# Patient Record
Sex: Female | Born: 1950 | Race: White | Hispanic: No | Smoking: Current every day smoker
Health system: Southern US, Community
[De-identification: ages and names within clinical notes are randomized; demographics above are authoritative.]

## PROBLEM LIST (undated history)

## (undated) ENCOUNTER — Ambulatory Visit: Admission: EM | Payer: 59 | Source: Home / Self Care

## (undated) DIAGNOSIS — F17200 Nicotine dependence, unspecified, uncomplicated: Secondary | ICD-10-CM

## (undated) DIAGNOSIS — M810 Age-related osteoporosis without current pathological fracture: Secondary | ICD-10-CM

## (undated) DIAGNOSIS — C20 Malignant neoplasm of rectum: Secondary | ICD-10-CM

## (undated) DIAGNOSIS — J439 Emphysema, unspecified: Secondary | ICD-10-CM

## (undated) DIAGNOSIS — J449 Chronic obstructive pulmonary disease, unspecified: Secondary | ICD-10-CM

## (undated) DIAGNOSIS — M1990 Unspecified osteoarthritis, unspecified site: Secondary | ICD-10-CM

## (undated) DIAGNOSIS — C801 Malignant (primary) neoplasm, unspecified: Secondary | ICD-10-CM

## (undated) DIAGNOSIS — E162 Hypoglycemia, unspecified: Secondary | ICD-10-CM

## (undated) DIAGNOSIS — H269 Unspecified cataract: Secondary | ICD-10-CM

## (undated) DIAGNOSIS — K56609 Unspecified intestinal obstruction, unspecified as to partial versus complete obstruction: Secondary | ICD-10-CM

## (undated) DIAGNOSIS — R198 Other specified symptoms and signs involving the digestive system and abdomen: Secondary | ICD-10-CM

## (undated) DIAGNOSIS — R0600 Dyspnea, unspecified: Secondary | ICD-10-CM

## (undated) DIAGNOSIS — M8580 Other specified disorders of bone density and structure, unspecified site: Secondary | ICD-10-CM

## (undated) DIAGNOSIS — F32A Depression, unspecified: Secondary | ICD-10-CM

## (undated) DIAGNOSIS — K625 Hemorrhage of anus and rectum: Secondary | ICD-10-CM

## (undated) DIAGNOSIS — F329 Major depressive disorder, single episode, unspecified: Secondary | ICD-10-CM

## (undated) DIAGNOSIS — D123 Benign neoplasm of transverse colon: Secondary | ICD-10-CM

## (undated) DIAGNOSIS — E871 Hypo-osmolality and hyponatremia: Secondary | ICD-10-CM

## (undated) DIAGNOSIS — C539 Malignant neoplasm of cervix uteri, unspecified: Secondary | ICD-10-CM

## (undated) DIAGNOSIS — D124 Benign neoplasm of descending colon: Secondary | ICD-10-CM

## (undated) HISTORY — DX: Malignant neoplasm of cervix uteri, unspecified: C53.9

## (undated) HISTORY — DX: Nicotine dependence, unspecified, uncomplicated: F17.200

## (undated) HISTORY — PX: ABDOMINAL HYSTERECTOMY: SHX81

## (undated) HISTORY — DX: Major depressive disorder, single episode, unspecified: F32.9

## (undated) HISTORY — DX: Hemorrhage of anus and rectum: K62.5

## (undated) HISTORY — DX: Hypoglycemia, unspecified: E16.2

## (undated) HISTORY — DX: Unspecified cataract: H26.9

## (undated) HISTORY — PX: CHOLECYSTECTOMY: SHX55

## (undated) HISTORY — DX: Chronic obstructive pulmonary disease, unspecified: J44.9

## (undated) HISTORY — PX: TUBAL LIGATION: SHX77

## (undated) HISTORY — DX: Hypo-osmolality and hyponatremia: E87.1

## (undated) HISTORY — DX: Age-related osteoporosis without current pathological fracture: M81.0

## (undated) HISTORY — DX: Unspecified intestinal obstruction, unspecified as to partial versus complete obstruction: K56.609

## (undated) HISTORY — DX: Other specified disorders of bone density and structure, unspecified site: M85.80

## (undated) HISTORY — DX: Depression, unspecified: F32.A

## (undated) HISTORY — DX: Emphysema, unspecified: J43.9

## (undated) HISTORY — PX: EYE SURGERY: SHX253

## (undated) HISTORY — PX: APPENDECTOMY: SHX54

## (undated) HISTORY — DX: Malignant (primary) neoplasm, unspecified: C80.1

## (undated) HISTORY — DX: Unspecified osteoarthritis, unspecified site: M19.90

## (undated) HISTORY — PX: SHOULDER SURGERY: SHX246

---
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
## 1977-11-01 DIAGNOSIS — C539 Malignant neoplasm of cervix uteri, unspecified: Secondary | ICD-10-CM

## 1977-11-01 HISTORY — DX: Malignant neoplasm of cervix uteri, unspecified: C53.9

## 1998-10-19 ENCOUNTER — Emergency Department (HOSPITAL_COMMUNITY): Admission: EM | Admit: 1998-10-19 | Discharge: 1998-10-19 | Payer: Self-pay

## 1999-10-29 ENCOUNTER — Ambulatory Visit (HOSPITAL_COMMUNITY): Admission: RE | Admit: 1999-10-29 | Discharge: 1999-10-29 | Payer: Self-pay | Admitting: *Deleted

## 2000-01-08 ENCOUNTER — Ambulatory Visit (HOSPITAL_COMMUNITY): Admission: RE | Admit: 2000-01-08 | Discharge: 2000-01-08 | Payer: Self-pay | Admitting: *Deleted

## 2000-03-03 ENCOUNTER — Ambulatory Visit (HOSPITAL_COMMUNITY): Admission: RE | Admit: 2000-03-03 | Discharge: 2000-03-03 | Payer: Self-pay | Admitting: *Deleted

## 2002-09-18 ENCOUNTER — Ambulatory Visit (HOSPITAL_COMMUNITY): Admission: RE | Admit: 2002-09-18 | Discharge: 2002-09-18 | Payer: Self-pay | Admitting: Family Medicine

## 2003-09-12 ENCOUNTER — Ambulatory Visit (HOSPITAL_COMMUNITY): Admission: RE | Admit: 2003-09-12 | Discharge: 2003-09-12 | Payer: Self-pay | Admitting: Internal Medicine

## 2003-09-20 ENCOUNTER — Ambulatory Visit (HOSPITAL_COMMUNITY): Admission: RE | Admit: 2003-09-20 | Discharge: 2003-09-20 | Payer: Self-pay | Admitting: Internal Medicine

## 2004-01-15 ENCOUNTER — Emergency Department (HOSPITAL_COMMUNITY): Admission: AD | Admit: 2004-01-15 | Discharge: 2004-01-16 | Payer: Self-pay | Admitting: Emergency Medicine

## 2004-02-12 ENCOUNTER — Encounter (INDEPENDENT_AMBULATORY_CARE_PROVIDER_SITE_OTHER): Payer: Self-pay | Admitting: Specialist

## 2004-02-12 ENCOUNTER — Observation Stay (HOSPITAL_COMMUNITY): Admission: RE | Admit: 2004-02-12 | Discharge: 2004-02-13 | Payer: Self-pay

## 2005-04-22 ENCOUNTER — Inpatient Hospital Stay (HOSPITAL_COMMUNITY): Admission: EM | Admit: 2005-04-22 | Discharge: 2005-04-25 | Payer: Self-pay | Admitting: Emergency Medicine

## 2007-12-09 ENCOUNTER — Emergency Department (HOSPITAL_COMMUNITY): Admission: EM | Admit: 2007-12-09 | Discharge: 2007-12-09 | Payer: Self-pay | Admitting: Emergency Medicine

## 2008-12-10 ENCOUNTER — Inpatient Hospital Stay (HOSPITAL_COMMUNITY): Admission: EM | Admit: 2008-12-10 | Discharge: 2008-12-11 | Payer: Self-pay | Admitting: Emergency Medicine

## 2011-02-16 LAB — URINE MICROSCOPIC-ADD ON

## 2011-02-16 LAB — CBC
HCT: 35.1 % — ABNORMAL LOW (ref 36.0–46.0)
Hemoglobin: 11.9 g/dL — ABNORMAL LOW (ref 12.0–15.0)
MCHC: 33.9 g/dL (ref 30.0–36.0)
MCHC: 34.1 g/dL (ref 30.0–36.0)
MCHC: 34.3 g/dL (ref 30.0–36.0)
MCV: 92.5 fL (ref 78.0–100.0)
Platelets: 326 10*3/uL (ref 150–400)
RBC: 3.81 MIL/uL — ABNORMAL LOW (ref 3.87–5.11)
RBC: 4.36 MIL/uL (ref 3.87–5.11)
RBC: 5.09 MIL/uL (ref 3.87–5.11)
RDW: 12.9 % (ref 11.5–15.5)
RDW: 13.2 % (ref 11.5–15.5)

## 2011-02-16 LAB — DIFFERENTIAL
Basophils Absolute: 0 10*3/uL (ref 0.0–0.1)
Eosinophils Absolute: 0 10*3/uL (ref 0.0–0.7)
Lymphocytes Relative: 2 % — ABNORMAL LOW (ref 12–46)
Lymphocytes Relative: 6 % — ABNORMAL LOW (ref 12–46)
Lymphs Abs: 1 10*3/uL (ref 0.7–4.0)
Monocytes Relative: 2 % — ABNORMAL LOW (ref 3–12)
Monocytes Relative: 2 % — ABNORMAL LOW (ref 3–12)
Neutro Abs: 16 10*3/uL — ABNORMAL HIGH (ref 1.7–7.7)
Neutrophils Relative %: 92 % — ABNORMAL HIGH (ref 43–77)
Neutrophils Relative %: 96 % — ABNORMAL HIGH (ref 43–77)

## 2011-02-16 LAB — COMPREHENSIVE METABOLIC PANEL
ALT: 12 U/L (ref 0–35)
AST: 22 U/L (ref 0–37)
Alkaline Phosphatase: 82 U/L (ref 39–117)
BUN: 11 mg/dL (ref 6–23)
CO2: 24 mEq/L (ref 19–32)
Calcium: 7.8 mg/dL — ABNORMAL LOW (ref 8.4–10.5)
Chloride: 104 mEq/L (ref 96–112)
GFR calc Af Amer: 59 mL/min — ABNORMAL LOW (ref >60)
GFR calc non Af Amer: 60 mL/min — ABNORMAL LOW (ref >60)
Glucose, Bld: 93 mg/dL (ref 70–99)
Potassium: 3.1 mEq/L — ABNORMAL LOW (ref 3.5–5.1)
Sodium: 137 mEq/L (ref 135–145)
Total Bilirubin: 0.5 mg/dL (ref 0.3–1.2)
Total Protein: 5 g/dL — ABNORMAL LOW (ref 6.0–8.3)

## 2011-02-16 LAB — URINALYSIS, ROUTINE W REFLEX MICROSCOPIC
Glucose, UA: NEGATIVE mg/dL
Nitrite: POSITIVE — AB
pH: 5.5 (ref 5.0–8.0)

## 2011-02-16 LAB — URINE CULTURE: Colony Count: >=100000

## 2011-02-16 LAB — CULTURE, BLOOD (ROUTINE X 2)

## 2011-02-16 LAB — RAPID URINE DRUG SCREEN, HOSP PERFORMED
Benzodiazepines: NOT DETECTED
Tetrahydrocannabinol: NOT DETECTED

## 2011-02-16 LAB — CARDIAC PANEL(CRET KIN+CKTOT+MB+TROPI)
CK, MB: 1 ng/mL (ref 0.3–4.0)
Relative Index: INVALID (ref 0.0–2.5)
Total CK: 81 U/L (ref 7–177)
Troponin I: <0.01 ng/mL (ref 0.00–0.06)
Troponin I: <0.01 ng/mL (ref 0.00–0.06)

## 2011-02-16 LAB — CK TOTAL AND CKMB (NOT AT ARMC): Relative Index: INVALID (ref 0.0–2.5)

## 2011-02-16 LAB — STOOL CULTURE

## 2011-02-16 LAB — OVA AND PARASITE EXAMINATION

## 2011-02-16 LAB — TSH: TSH: 0.801 u[IU]/mL (ref 0.350–4.500)

## 2011-03-16 NOTE — H&P (Signed)
Deborah Arroyo, Deborah Arroyo                ACCOUNT NO.:  [id number]   MEDICAL RECORD NO.:  [id number]          PATIENT TYPE:  EMS   LOCATION:  ED                           FACILITY:  [facility]   PHYSICIAN:  Raphael Gibney, MD        DATE OF BIRTH:  1951/06/13   DATE OF ADMISSION:  12/10/2008  DATE OF DISCHARGE:                              HISTORY & PHYSICAL   PRIMARY CARE PHYSICIAN:  Unassigned.   CHIEF COMPLAINT:  Syncope.   HISTORY OF PRESENT ILLNESS:  Deborah Arroyo is a 60 year old white female  with no past medical problems, comes in to the emergency department with  at least two episodes of passing out.  She states that she got up  around 12:00 a.m. this morning to use the bathroom.  After she got to  the bathroom she felt lightheaded, dizzy, clammy and then the next thing  she remembers is her son trying to wake her up.  She denies any chest  pain, shortness of breath, spinning of the rooms at this time.  She  states that she also broke out into a cold sweat.  She then went back to  her bed and around 2:15 a.m. got up again to use the restroom.  She  states that she walked up to the living room and she again felt  lightheaded, dizzy, cold and clammy.  The next thing she can remember is  her son and nephew waking her up again.  This time, she had soiled her  clothes with stools as well.  She denies any postictal symptoms such as  headache or confusion.  No reported seizure activity was noted per her  son as well.  Deborah Arroyo states that in the early 1970s she had an  episode of hypoglycemia and passed out.  She was seen in the [facility] back then recovered without any sequelae.  She has not had any  other episodes of syncope ever since.   PAST MEDICAL HISTORY:  1. Current tobacco use.  2. Distant history of hypoglycemic episode and syncope denies any      significant history of coronary artery disease, seizures or strokes      at this time.   PAST SURGICAL HISTORY:  1.  Hysterectomy.  2. Cholecystectomy.  3. Appendectomy.  4. Shoulder surgery.   ALLERGIES:  NKDA.   MEDICATIONS:  None.   SOCIAL HISTORY:  Smokes one to one and a half packs a day for the last  30 years.  Denies any excess alcohol or tobacco abuse.   FAMILY HISTORY:  Not significant.   REVIEW OF SYSTEMS:  Positive history of dysuria over the last few days.  Denies any bleeding or urine or stools.  Does state that she feels dry  due to weather.   PHYSICAL EXAMINATION:  VITAL SIGNS:  Temperature 97.8, blood pressure  107/60, pulse 98, respirations 16.  GENERAL:  Deborah Arroyo is a 60 year old Caucasian female who is alert and  oriented x3.  She is resting quite well.  Oropharynx dry.  Poor oral  hygiene.  PERRLA.  EOMI.  NECK:  Supple.  No lymphadenopathy.  CHEST:  Poor air entry bilaterally.  CV: S1-S2, no murmurs, rubs or gallops.  ABDOMEN:  Soft, nontender.  No clearly palpable hepatosplenomegaly.  EXTREMITIES:  No cyanosis, clubbing or edema.  NEUROLOGICAL: No obvious focal deficits.   LABORATORY DATA:  WBC 24.5, hemoglobin 16.1, platelets 331.  Sodium 137,  potassium 3.9, chloride 104, creatinine 1.1, glucose 120.  Urine drug  screen negative.  UA nitrates positive.  WBC moderate.  Bacteria  present.   RADIOLOGIC DATA:  CT scan of the head did not reveal any acute  intracranial process.  CT scan of the abdomen and pelvis also did not  reveal any acute abdominal findings.   ASSESSMENT:  Deborah Arroyo is a 60 year old white female with a history of  new-onset syncope, etiology of which is unclear.  She also has a UTI.   PLAN:  1. Will admit to [facility].  Will place on telemetry.  Will      obtain EKG.  Will rule out cardiac isoenzymes times three.  2. Blood culture, urine culture.  Will start Rocephin 1 gram q.24 h.      Will consult Neurology for input into possibly ruling out seizure      disorder and IV fluids.  3. IV fluids normal saline at 125 cc/hour.  Will send  out stool      studies for ova and parasites and stool culture.  4. Regular diet.  Will place on seizure precautions and high fall      risk.  DVT prophylaxis with Lovenox.  GI prophylaxis with PPI.   CODE STATUS:  Full.      Raphael Gibney, MD  Electronically Signed     HV/MEDQ  D:  12/10/2008  T:  12/10/2008  Job:  [id number]

## 2011-03-16 NOTE — Consult Note (Signed)
NAMELUBNA, STEGEMAN                ACCOUNT NO.:  [id number]   MEDICAL RECORD NO.:  [id number]          PATIENT TYPE:  INP   LOCATION:  0104                         FACILITY:  [facility]   PHYSICIAN:  Marlan Palau, M.D.  DATE OF BIRTH:  04/05/1951   DATE OF CONSULTATION:  12/10/2008  DATE OF DISCHARGE:                                 CONSULTATION   HISTORY OF PRESENT ILLNESS:  Deborah Arroyo is a 60 year old, right-  handed, white female born 27-Jan-1951, with a history of syncope  today.  This patient was feeling sick, got up out of bed around 12  midnight with nausea, vomiting, diarrhea, sweats.  Patient noted that  she was quite dizzy, lightheaded, felt like she might blackout when she  went to the bathroom.  Patient, however, did not lose consciousness.  Patient, however, was still feeling ill by 1:15, once again felt like  she had to have a bowel movement with diarrhea, was nauseated, got up,  went to the bathroom, as soon as she got up became lightheaded, sweaty,  clammy.  Patient did not make to the bathroom, apparently blacked out,  son found her on the floor.  Patient had bowel and bladder incontinence  on the floor.  Patient was cleaned up and went back to back to bed but  by 2:15 a.m. once again patient felt like she had to have a bowel  movement, was nauseated, clammy.  Patient got up out of bed after she  called her son.  Son accompanied her to the bathroom but patient became  blanched in the face, clammy, weak all over, began to collapse, no  stiffening or jerking was noted.  Patient once again had vomiting on the  floor, had bowel and bladder incontinence, had blacked out briefly for a  few seconds.  Patient was brought to the emergency room for further  evaluation.  CT scan of the brain was done and was unremarkable.  Patient was found to have evidence of a bladder infection, an elevated  white count of 24.5.  Patient has a negative urine drug screen.  Neurology was  asked to see this patient for further evaluation.   PAST MEDICAL HISTORY:  Significant for:  1. History of syncope with vasovagal qualities.  2. Urinary tract infection.  3. Gallbladder resection.  4. Hysterectomy.  5. Appendectomy.  6. Bilateral tubal ligation.  7. History of trauma to the eye with surgery as a child.  8. Left rotator cuff tear surgery.   Patient is on no medications.  HAS ALLERGY TO ALEVE WHICH CAUSES A RASH.  Smokes a pack and a half of cigarettes daily.  Does not drink alcohol.   SOCIAL HISTORY:  This patient lives in the [place], [place], area, is divorced, has 3 children, Morley [employer].   FAMILY MEDICAL HISTORY:  Notable that mother is alive with senile  dementia of the Alzheimer type.  Father died with senile dementia of the  Alzheimer type.  Patient has 2 brothers and 2 sisters.  Patient has had  1 sister with seizures  due to head trauma, 1 brother with heart disease  status post surgery, 1 sister with a traumatic amputation of the arm, 1  brother just recently had his appendix out.   REVIEW OF SYSTEMS:  Notable for no fevers.  Patient has had some chills.  Patient does note history of headache, does note some neck discomfort at  times.  Denies shortness of breath.  Chest pains have occurred about a  month ago but not currently.  Patient denies palpitations.  Patient has  had some nausea and vomiting, some bowel and bladder incontinence today.  Patient denies any numbness of the extremities, feels weak all over.  Patient again reports dizziness with standing.   PHYSICAL EXAMINATION:  VITALS:  Blood pressure is 107/60.  Heart rate  98.  Respiratory rate 16.  Temperature afebrile.  GENERAL:  This patient is a fairly well-developed white female who is  alert and cooperative at the time of examination.  HEENT EXAMINATION:  Head is atraumatic.  Eyes, pupils are equal, round,  and react to light.  Disks are flat bilaterally.  NECK:   Supple.  No carotid bruits noted.  Tongue was without  lacerations.  RESPIRATORY EXAMINATION:  Clear.  CARDIOVASCULAR EXAMINATION:  Reveals regular rate and rhythm.  No  obvious murmurs or rubs noted.  EXTREMITIES:  Without significant edema.  NEUROLOGIC EXAMINATION:  Cranial nerves as above.  Facial symmetry is  present.  Patient has good sensation of face to pinprick, soft touch  bilaterally.  There is good strength in facial muscles and muscles  during shoulder shrug bilaterally.  Speech is well enunciated and not  aphasic.  Motor testing reveals 5/5 strength in all 4's.  Good symmetric  motor is noted throughout.  Sensory testing is intact to pinprick, soft  touch, vibratory sensation throughout.  Position sense is intact in all  4's.  No evidence of extinction is noted.  Patient has good finger-nose-  finger and toe-to-finger bilaterally.  Gait was not tested.  Deep tendon  reflexes symmetric.  Normal toes downgoing bilaterally.  Again, no drift  is seen in the upper extremities.   LABORATORY VALUES:  Notable for sodium 137, potassium 3.9 chloride of  104, CO2 of 25, glucose of 120, BUN of 16, creatinine 1.15, calcium 9.3,  total protein 7.0, albumin of 3.6, AST of 22, ALT of 12, alcohol level  less than 5, white count of 24.5, hemoglobin 16.1, hematocrit 47.1, MCV  92.5, platelets of 331.  Urinalysis reveals specific gravity of 1.020,  pH is 5.5, 11 to 20 white cells in the urine, 3 to 6 red cells.  Urine  drug screen is negative.  Chest x-ray was unremarkable.  CT of the head  again was unremarkable.  Patient has also had a CT of the abdomen,  cervical spine, pelvis that have all been unremarkable.   IMPRESSION:  1. Episodes of syncope with vasovagal qualities.  2. Tobacco abuse.  3. Urinary tract infection.   This patient has had syncope associated with nausea, vomiting, diarrhea.  Patient has had 1 episode of syncope that was witnessed again consistent  with a vasovagal  event with blanching of the face, clammy skin, nausea,  vomiting, diarrhea.  Dizziness and syncope occurred twice, both times  while standing after lying in bed feeling sick.  These episodes did not  appear to represent seizures.  No jerking, twitching, stiffening was  seen.  No tongue biting is noted.  I see no indication for further seizure  workup but patient does need to  be medically managed for a urinary tract infection.  Patient likely has  had some degree of dehydration associated with vomiting and diarrhea.  We will sign off at this point.  We would be happy to this patient again  if needed.      Marlan Palau, M.D.  Electronically Signed     CKW/MEDQ  D:  12/10/2008  T:  12/10/2008  Job:  [id number]   cc:   [facility]  [address]  [address]

## 2011-03-19 NOTE — Op Note (Signed)
NAMEZAYDA, ANGELL                ACCOUNT NO.:  [id number]   MEDICAL RECORD NO.:  [id number]          PATIENT TYPE:  EMS   LOCATION:  MAJO                         FACILITY:  [facility]   PHYSICIAN:  Dionne Ano. Gramig III, M.D.DATE OF BIRTH:  1951/03/14   DATE OF PROCEDURE:  04/22/2005  DATE OF DISCHARGE:                                 OPERATIVE REPORT   PREOPERATIVE DIAGNOSIS:  Abscess, left hand, deep in location with ascending  cellulitis.   POSTOPERATIVE DIAGNOSIS:  Abscess, left hand, deep in location with  ascending cellulitis.   OPERATION PERFORMED:  Incision and drainage, left deep dorsal hand abscess.   SURGEON:  Dionne Ano. Gramig, M.D.   CULTURES:  Aerobic and anaerobic taken.   ANESTHESIA:   COMPLICATIONS:  None.   INDICATIONS FOR PROCEDURE:  Deborah Arroyo is a very pleasant 60-year-  old female who presents to the [facility] emergency room.  I was asked to  see her by Doug Sou, M.D. in regard to her upper extremity  predicament.  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  Subsequent to that, she has had  worsening pain and swelling and ascending cellulitis.  She presents with a  deep abscess, ascending cellulitis.  I have counseled the patient in regard  to risks and benefits of surgery and she desires to proceed with operative  intervention.   DESCRIPTION OF PROCEDURE:  The patient was seen by myself and taken to the  procedure suite, underwent a field block.  Following this, she was prepped  and draped in the usual sterile fashion with Betadine scrub and paint.  I  then performed incision and drainage of skin and subcutaneous tissue and  deep tissue, incising the area with a 15 blade knife.  Purulence was  obtained.  I cultured this for aerobic and anaerobic culture.  Following  this, she was copiously lavaged with saline, packed open and then dressed  sterilely.  The patient tolerated the  procedure well.  There were no  complicating features.  All sponge, needle and instrument counts were  reported as correct.  She will be admitted for IV Unasyn antibiotics,  elevation, observation and pain management according to her needs.  I have  discussed with her do's and don't's etc. and all questions have been  encouraged and answered.        WMG/MEDQ  D:  04/22/2005  T:  04/23/2005  Job:  [id number]

## 2011-03-19 NOTE — Op Note (Signed)
NAME:  Deborah Arroyo, Deborah Arroyo                          ACCOUNT NO.:  [id number]   MEDICAL RECORD NO.:  [id number]                   PATIENT TYPE:  OBV   LOCATION:  1610                                 FACILITY:  [facility]   PHYSICIAN:  Lorre Munroe., M.D.            DATE OF BIRTH:  1951/05/28   DATE OF PROCEDURE:  02/12/2004  DATE OF DISCHARGE:                                 OPERATIVE REPORT   PREOPERATIVE DIAGNOSIS:  Symptomatic gallstones.   POSTOPERATIVE DIAGNOSIS:  Symptomatic gallstones.   OPERATION:  Laparoscopic cholecystectomy.   SURGEON:  Zigmund Daniel, M.D.   ASSISTANT:  Leonie Man, M.D.   ANESTHESIA:  General.   DESCRIPTION OF PROCEDURE:  After the patient was identified as Deborah Arroyo,  agreed to the operation, had induction of anesthesia, adequate monitoring,  and routine preparation and draping of the abdomen, I infused local  anesthetic at 4 sites for placement of ports.  I made a short incision just  below the umbilicus, cut the fascia longitudinally and opened peritoneum  bluntly, placed a 0 Vicryl pursestring suture and secured a Hasson cannula.  Laparoscopic evaluation disclosed no abnormalities except for some adhesions  to the undersurface of the gallbladder.  After placement of 3 ports under  direct vision and placement of the patient head-up, foot-down, and tilted  toward the left, I retracted the fundus of the gallbladder toward the right  shoulder, took down the adhesions, and pulled the infundibulum laterally.  The patient was quite thin, and the biliary anatomy was very obvious.  I  identified the cystic duct as it emerged from the infundibulum of the  gallbladder and identified the cystic artery, crossing the space between the  liver and the gallbladder.  I clipped each structure with 4 clips and cut  each between the two which were closest to the gallbladder.  I found no  other arterial branches.  I could clearly identify the common bile duct  and  avoid harming it.  I dissected the gallbladder from the liver, utilizing the  cautery and the spatula instrument and got hemostasis with the cautery.  I  accidentally made a hole in the gallbladder, and some bile spilled out, but  I did not spill any stones.  After detaching the gallbladder from the liver,  I placed it in a plastic pouch and then copiously irrigated the gallbladder  fossa and right upper quadrant and removed the irrigant.  I irrigated until  all the irrigant came back clear.  Hemostasis was excellent, and the clips  appeared secure, and there was no evident leakage of bile.  I then removed  the gallbladder in a plastic pouch through the umbilical incision and tied  the  pursestring suture, then removed the last bit of irrigation fluid and  removed the lateral ports under direct vision.  After allowing the CO2 to  escape, I removed the epigastric port.  I closed  all skin incisions with  intracuticular 4-0 Vicryl and Steri-Strips and applied bandages.  She  tolerated the operation well.                                               Lorre Munroe., M.D.    WB/MEDQ  D:  02/12/2004  T:  02/12/2004  Job:  [id number]   cc:   Windle Guard, M.D.  [address]  [place], [place] [address]  Fax: [phone]

## 2011-03-19 NOTE — Op Note (Signed)
Deborah Arroyo, Deborah Arroyo                ACCOUNT NO.:  [id number]   MEDICAL RECORD NO.:  [id number]          PATIENT TYPE:  INP   LOCATION:  6706                         FACILITY:  [facility]   PHYSICIAN:  Dionne Ano. Gramig III, M.D.DATE OF BIRTH:  1951/04/11   DATE OF PROCEDURE:  04/25/2005  DATE OF DISCHARGE:  04/25/2005                                 OPERATIVE REPORT   PREOPERATIVE DIAGNOSIS:  Status post left hand deep abscess incision and  drainage, presents for repeat incision and drainage and dressing change.   POSTOPERATIVE DIAGNOSIS:  Status post left hand deep abscess incision and  drainage, presents for repeat incision and drainage and dressing change.   PROCEDURE:  Irrigation and debridement left hand.   SURGEON:  Dominica Severin, M.D.   ASSISTANT:  None.   COMPLICATIONS:  None.   PROCEDURE NOTE:  Patient was seen and underwent I&D of skin and subcutaneous  tissue.  The patient had improved wound conditions.  I irrigated copiously  and placed wet-to-dry dressing.  Her cellulitis has gone down considerably.  Range of motion in the fingers has improved and there is no signs of  worsening infection.  She is quite stable today.  She was dressed in a large  bulky sterile dressing and tolerated the procedure well.        WMG/MEDQ  D:  04/25/2005  T:  04/25/2005  Job:  [id number]

## 2011-03-19 NOTE — Discharge Summary (Signed)
NAMEZAMERIA, VOGL                ACCOUNT NO.:  [id number]   MEDICAL RECORD NO.:  [id number]          PATIENT TYPE:  INP   LOCATION:  6706                         FACILITY:  [facility]   PHYSICIAN:  Dionne Ano. Gramig III, M.D.DATE OF BIRTH:  Mar 04, 1951   DATE OF ADMISSION:  04/22/2005  DATE OF DISCHARGE:  04/25/2005                                 DISCHARGE SUMMARY   [name] was admitted April 22, 2005, for I&D of the left hand  secondary to ascending cellulitis, deep abscess and worsening features.  She  had a white count of 17 and underwent I&D April 22, 2005, emergently.  Postoperatively she was placed on IV Unasyn, underwent I&D on April 24, 2005  and April 25, 2005, and daily dressing changes, of course.  She improved to a  normal white count of 10.  She resolved her cellulitis and a large  epitrochlear lymph node and was stable at the day of discharge with normal  electrolytes and WBC.  Her cultures grew out Streptococcus.  I placed her on  ampicillin 500 mg one p.o. t.i.d. and will plan to see her back in the  office tomorrow for daily whirlpool and dressing changes.  I have discussed  __________ notes.  I have discharged her on Vicodin for pain, elevation,  finger range of motion, etc. will be adhered to, she will be out of work  until further notice.  She tolerated her hospitalization rather well in  terms of resolving her infection.  We will monitor her condition carefully  and make sure that all parameters return to normality.  She does have a 1.5  x 1.5 cm area that will need to heal in by secondary intention as long as  wound conditions will tolerate.        WMG/MEDQ  D:  04/25/2005  T:  04/25/2005  Job:  [id number]

## 2011-03-19 NOTE — Op Note (Signed)
[name], [name]                ACCOUNT NO.:  [id number]   MEDICAL RECORD NO.:  [id number]          PATIENT TYPE:  INP   LOCATION:  6706                         FACILITY:  [facility]   PHYSICIAN:  Dionne Ano. Gramig III, M.D.DATE OF BIRTH:  1951/08/05   DATE OF PROCEDURE:  04/24/2005  DATE OF DISCHARGE:  04/25/2005                                 OPERATIVE REPORT   PREOPERATIVE DIAGNOSIS:  Left hand infection with deep abscess status post  initial incision and drainage April 22, 2005.   POSTOPERATIVE DIAGNOSIS:  Left hand infection with deep abscess status post  initial incision and drainage April 22, 2005.   PROCEDURE:  Repeat incision and drainage left hand.   SURGEON:  Dominica Severin, M.D.   INDICATIONS FOR PROCEDURE:  [name] is a 60 year old female who presents  for repeat I&D of her hand.   Patient was seen and underwent a debridement, excisional nature, about the  left hand; this was skin and subcutaneous tissue I&D.  This was an  excisional debridement, removing devitalized skin tissue.  She tolerated  this well.  I packed her with a wet to dry dressing then placed a full  dressing on her.  Will continue IV antibiotics, immobilization and general  postop measures.  She is responding with some improvement noted.        WMG/MEDQ  D:  04/25/2005  T:  04/25/2005  Job:  [id number]

## 2011-03-19 NOTE — Discharge Summary (Signed)
Deborah Arroyo, GUERRIERI                ACCOUNT NO.:  [id number]   MEDICAL RECORD NO.:  [id number]          PATIENT TYPE:  INP   LOCATION:  1403                         FACILITY:  [facility]   PHYSICIAN:  Hillery Aldo, M.D.   DATE OF BIRTH:  [birth year]   DATE OF ADMISSION:  12/10/2008  DATE OF DISCHARGE:  12/11/2008                               DISCHARGE SUMMARY   PRIMARY CARE PHYSICIAN:  Unassigned.   DISCHARGE DIAGNOSES:  1. Vasovagal syncope.  2. Tobacco abuse.  3. Self-limited diarrhea.  4. Escherichia coli urinary tract infection.  5. Hypokalemia.  6. Mild protein depletion malnutrition.   DISCHARGE MEDICATIONS:  The patient left against medical advice, and no  prescriptions were given prior to discharge.  She was not on any routine  medications prior to admission.   BRIEF ADMISSION HISTORY OF PRESENT ILLNESS:  The patient is a 60-year-  old female who presented to the emergency department after presyncopal  and syncopal episodes that occurred after GI distress with diarrhea.  The patient was brought to the hospital for evaluation after several of  these episodes transpired, the last of which  resulted in loss of  consciousness.  She was admitted for further evaluation and workup.  For  the full details, please see the dictated report done by Dr. Sherril Croon.   CONSULTATIONS:  None.   PROCEDURES AND DIAGNOSTIC STUDIES:  1. Chest x-ray on December 10, 2008, showed no acute pulmonary      findings.  2. Thoracic spine films on December 10, 2008, showed normal alignment      and no acute bony findings.  3. A CT scan of the cervical spine on December 10, 2008, showed normal      alignment and no acute bony findings.  4. A CT scan of the head on December 10, 2008, showed no acute      intracranial findings and no skull fracture.  5. A CT scan of the abdomen and pelvis on December 10, 2008, showed no      acute abdominal or pelvic findings.   DISCHARGE LABORATORY VALUES:  Stool was negative  for ova and parasites.  Urine cultures grew greater than 100,000 colonies of Escherichia coli.  Sodium was 136, potassium 3.1, chloride 107, bicarb 24, BUN 11,  creatinine 0.96.  Liver function studies were within normal limits.  Total protein was 5, albumin 2.7.  White blood cell count was 10.3, hemoglobin 11.9, hematocrit 35.1,  platelets 269.  TSH was 0.801.  Urine drug screen was negative.   HOSPITAL COURSE BY PROBLEM:  1. Vasovagal syncope:  The patient's syncope was felt to be due to      vasovagal factors in that each event was preceded by GI distress      and diarrhea.  She was monitored on telemetry throughout her      hospital course and cardiac markers were negative x3 sets.  No      further diagnostic evaluation is likely to be necessary although      the patient did leave against medical advice.  2. Escherichia coli urinary tract  infection:  The patient had evidence      of a urinary tract infection on urinalysis.  Her urine was positive      for nitrites and had pus and bacteria on microscopy.  She was      empirically put on Rocephin.  The patient left against medical      advice before speciation and sensitivity data were available.  We      will attempt to call her at home and recommend she contact her      primary care physician for further antibiotic treatment.  3. Hypokalemia:  The patient was provided with potassium      supplementation while in the hospital.   DISPOSITION:  The patient left against medical advice before urine  culture speciation and sensitivity data were available.  It is unclear  at this time if she has a primary care physician; however, we will  attempt to contact her and recommend she pursue treatment for her  urinary tract infection.      Hillery Aldo, M.D.  Electronically Signed     CR/MEDQ  D:  12/13/2008  T:  12/13/2008  Job:  [id number]

## 2012-01-07 ENCOUNTER — Other Ambulatory Visit: Payer: Self-pay

## 2012-01-07 ENCOUNTER — Encounter (HOSPITAL_COMMUNITY): Payer: Self-pay | Admitting: *Deleted

## 2012-01-07 ENCOUNTER — Emergency Department (HOSPITAL_COMMUNITY): Payer: Self-pay

## 2012-01-07 ENCOUNTER — Emergency Department (HOSPITAL_COMMUNITY)
Admission: EM | Admit: 2012-01-07 | Discharge: 2012-01-07 | Disposition: A | Discharge status: Home / Self Care | Payer: Self-pay | Attending: Emergency Medicine | Admitting: Emergency Medicine

## 2012-01-07 DIAGNOSIS — R42 Dizziness and giddiness: Secondary | ICD-10-CM | POA: Insufficient documentation

## 2012-01-07 DIAGNOSIS — M79609 Pain in unspecified limb: Secondary | ICD-10-CM | POA: Insufficient documentation

## 2012-01-07 DIAGNOSIS — IMO0001 Reserved for inherently not codable concepts without codable children: Secondary | ICD-10-CM | POA: Insufficient documentation

## 2012-01-07 DIAGNOSIS — R112 Nausea with vomiting, unspecified: Secondary | ICD-10-CM | POA: Insufficient documentation

## 2012-01-07 DIAGNOSIS — E871 Hypo-osmolality and hyponatremia: Secondary | ICD-10-CM | POA: Insufficient documentation

## 2012-01-07 DIAGNOSIS — M542 Cervicalgia: Secondary | ICD-10-CM | POA: Insufficient documentation

## 2012-01-07 LAB — DIFFERENTIAL
Basophils Absolute: 0.1 10*3/uL (ref 0.0–0.1)
Basophils Relative: 0 % (ref 0–1)
Eosinophils Absolute: 0.1 10*3/uL (ref 0.0–0.7)
Eosinophils Relative: 1 % (ref 0–5)
Lymphocytes Relative: 13 % (ref 12–46)
Monocytes Absolute: 0.8 10*3/uL (ref 0.1–1.0)

## 2012-01-07 LAB — URINE MICROSCOPIC-ADD ON

## 2012-01-07 LAB — COMPREHENSIVE METABOLIC PANEL
ALT: 20 U/L (ref 0–35)
AST: 29 U/L (ref 0–37)
Albumin: 3.9 g/dL (ref 3.5–5.2)
Calcium: 9.7 mg/dL (ref 8.4–10.5)
Creatinine, Ser: 0.66 mg/dL (ref 0.50–1.10)
GFR calc non Af Amer: >90 mL/min (ref >90)
Sodium: 127 mEq/L — ABNORMAL LOW (ref 135–145)
Total Protein: 7.5 g/dL (ref 6.0–8.3)

## 2012-01-07 LAB — CBC
HCT: 41.5 % (ref 36.0–46.0)
MCH: 30.2 pg (ref 26.0–34.0)
MCHC: 35.2 g/dL (ref 30.0–36.0)
MCV: 85.7 fL (ref 78.0–100.0)
Platelets: 439 10*3/uL — ABNORMAL HIGH (ref 150–400)
RDW: 13.5 % (ref 11.5–15.5)
WBC: 16 10*3/uL — ABNORMAL HIGH (ref 4.0–10.5)

## 2012-01-07 LAB — URINALYSIS, ROUTINE W REFLEX MICROSCOPIC
Glucose, UA: NEGATIVE mg/dL
Leukocytes, UA: NEGATIVE
Protein, ur: NEGATIVE mg/dL
Specific Gravity, Urine: 1.015 (ref 1.005–1.030)

## 2012-01-07 LAB — POCT I-STAT TROPONIN I: Troponin i, poc: 0 ng/mL (ref 0.00–0.08)

## 2012-01-07 MED ORDER — OXYCODONE-ACETAMINOPHEN 5-325 MG PO TABS: 1.0000 | ORAL_TABLET | ORAL | Status: DC | PRN

## 2012-01-07 MED ORDER — SODIUM CHLORIDE 0.9 % IV BOLUS (SEPSIS)
1000.0000 mL | Freq: Once | INTRAVENOUS | Status: AC
  Administered 2012-01-07: 1000 mL via INTRAVENOUS

## 2012-01-07 MED ORDER — ONDANSETRON HCL 4 MG/2ML IJ SOLN
4.0000 mg | Freq: Once | INTRAMUSCULAR | Status: AC
  Administered 2012-01-07: 4 mg via INTRAVENOUS
  Filled 2012-01-07: qty 2

## 2012-01-07 NOTE — ED Notes (Signed)
N/V this am x 2. Loose stools this am x 3 Lightheaded since last night

## 2012-01-07 NOTE — ED Notes (Signed)
[id number]> Expected date:<BR> Expected time:12:25 PM<BR> Means of arrival:<BR> Comments:<BR> PTAR 27 - 60yoF Leg and neck pain, nausea

## 2012-01-07 NOTE — ED Provider Notes (Signed)
History     CSN: [id number]  Arrival date & time 01/07/12  1227   First MD Initiated Contact with Patient 01/07/12 1319      Chief Complaint  Patient presents with  . Muscle Pain    neck  . Leg Pain    (Consider location/radiation/quality/duration/timing/severity/associated sxs/prior treatment) Patient is a 61 y.o. female presenting with musculoskeletal pain and leg pain. The history is provided by the patient. No language interpreter was used.  Muscle Pain This is a new problem. The current episode started yesterday. Associated symptoms include nausea and vomiting. Pertinent negatives include no abdominal pain, chest pain, fatigue, fever, headaches, rash, sore throat, urinary symptoms or weakness.  Leg Pain   lightheaded x 2 days with nausea vomiting x 3 today also leg pain and neck pain.  Took advil with some relief.  pmh of migraines.  Labs pending .  Will hydrate and give zofran.    Past Medical History  Diagnosis Date  . Migraines   . Cervical cancer     Past Surgical History  Procedure Date  . Cholecystectomy   . Shoulder surgery   . Abdominal hysterectomy   . Appendectomy   . Tubal ligation     Family History  Problem Relation Age of Onset  . Cancer Mother   . Diabetes Sister   . Cancer Sister   . Cancer Father   . Cancer Brother   . Cancer Other     History  Substance Use Topics  . Smoking status: Current Everyday Smoker -- 1.0 packs/day    Types: Cigarettes  . Smokeless tobacco: Not on file  . Alcohol Use: No    OB History    Grav Para Term Preterm Abortions TAB SAB Ect Mult Living                  Review of Systems  Constitutional: Negative for fever and fatigue.  HENT: Negative for sore throat.   Cardiovascular: Negative for chest pain.  Gastrointestinal: Positive for nausea and vomiting. Negative for abdominal pain.  Skin: Negative for rash.  Neurological: Negative for weakness and headaches.  All other systems reviewed and are  negative.    Allergies  Naproxen sodium  Home Medications   Current Outpatient Rx  Name Route Sig Dispense Refill  . ACETAMINOPHEN 500 MG PO TABS Oral Take 1,500 mg by mouth every 6 (six) hours as needed. pain      BP 114/69  Pulse 72  Temp(Src) 97.8 F (36.6 C) (Oral)  Resp 19  SpO2 92%  Physical Exam  Nursing note and vitals reviewed. Constitutional: She is oriented to person, place, and time. She appears well-developed and well-nourished.  HENT:  Head: Normocephalic and atraumatic.  Eyes: Conjunctivae and EOM are normal. Pupils are equal, round, and reactive to light.  Neck: Normal range of motion. Neck supple. No thyromegaly present.  Cardiovascular: Normal rate, regular rhythm, normal heart sounds and intact distal pulses.  Exam reveals no gallop and no friction rub.   No murmur heard. Pulmonary/Chest: Effort normal and breath sounds normal.  Abdominal: Soft. Bowel sounds are normal. She exhibits no distension. There is no tenderness.  Musculoskeletal: Normal range of motion. She exhibits no edema and no tenderness.  Neurological: She is alert and oriented to person, place, and time. She has normal reflexes.  Skin: Skin is warm and dry.  Psychiatric: She has a normal mood and affect.    ED Course  Procedures (including critical care time)  Labs Reviewed  CBC - Abnormal; Notable for the following:    WBC 16.0 (*)    Platelets 439 (*)    All other components within normal limits  DIFFERENTIAL - Abnormal; Notable for the following:    Neutrophils Relative 81 (*)    Neutro Abs 13.0 (*)    All other components within normal limits  COMPREHENSIVE METABOLIC PANEL - Abnormal; Notable for the following:    Sodium 127 (*)    Chloride 91 (*)    Alkaline Phosphatase 139 (*)    All other components within normal limits  URINALYSIS, ROUTINE W REFLEX MICROSCOPIC - Abnormal; Notable for the following:    Hgb urine dipstick LARGE (*)    All other components within normal  limits  URINE MICROSCOPIC-ADD ON - Abnormal; Notable for the following:    Squamous Epithelial / LPF FEW (*)    Bacteria, UA FEW (*)    All other components within normal limits  LIPASE, BLOOD  POCT I-STAT TROPONIN I   Dg Cervical Spine Complete  01/07/2012  *RADIOLOGY REPORT*  Clinical Data: 61 year old female with posterior neck pain.  CERVICAL SPINE - COMPLETE 4+ VIEW  Comparison: 12/10/2008 CT  Findings: Mild straightening of the normal cervical lordosis is again identified. There is no evidence of fracture, subluxation or prevertebral soft tissue swelling. No significant degenerative changes are noted. The disc spaces are maintained. There is no evidence of bony foraminal narrowing or focal bony lesions.  IMPRESSION: No evidence of acute abnormality.  Stable mild straightening of the normal cervical lordosis without other significant abnormality.  Original Report Authenticated By: Rosendo Gros, M.D.     No diagnosis found.    MDM  60yo here with lightheadedness since last pm, diarrhea and nausea and vomiting with neck pain today.    Na 127.  Feels better after 1Lns and zofran in the ER.  WBC 1600.  Will return to ER/urgent care for recheck of sodium on Monday.  Will return to ER if worse n/v confusion, severe pain, high fever.  Doubt meningeal signs.  Discussed with dr. Judd Lien.  Will culture urine.        Jethro Bastos, NP 01/07/12 1842  Jethro Bastos, NP 01/07/12 1845

## 2012-01-07 NOTE — ED Notes (Signed)
CBG by EMS 124.  EMS vitals 120/92, 84, 16, pain level 5/10.  Also reported N/V/D.

## 2012-01-07 NOTE — ED Provider Notes (Signed)
Patient was screened by me. 61 year old female presents with neck pain, nausea, vomiting, dizziness for the past 2 days. Patient is a poor historian. However on exam she has meningismal sign. She is afebrile. Heart is with regular rate and rhythm with no murmurs rubs gallops, lungs clear to auscultation bilaterally, abdomen is soft and nontender.  Initial blood work is significant for sodium of 127, chloride of 91, alkaline phosphatase of 139. Patient has been elevated white count of 16 with a left shift. Her chest x-ray is unremarkable. I discussed patient's care with Dr. Judd Lien who agrees to see and evaluate patient fully. Patient is currently in no acute distress. Nontoxic.  Fayrene Helper, PA-C 01/07/12 1524

## 2012-01-07 NOTE — Discharge Instructions (Signed)
Ms Kallio your sodium today is 127.  Come back in 2 days or get a PCP and have your sodium rechecked.  You can also go to an Urgent Care to have this done.  The WBC was elevated slightly at 16.  Take the zofran for nausea.  You did have bacteria in your urine. We will culture this urine and ascitic cultures positive he will receive a call and be started on antibiotics. X-ray of your neck showed no abnormalty. Return to the ER for uncontrolled nausea vomiting or high fever. Get some Gatorade and drink it for the next 2 days. Do not drink over 2 cups of coffee a day because this is a diuretic and can make your sodium lower. You can drink water with Gatorade. Take advil for pain.  For severe pain take the percocet but do not drive with this.

## 2012-01-07 NOTE — ED Notes (Signed)
NP at bedside.

## 2012-01-07 NOTE — ED Notes (Signed)
Pt has pain to neck and LT leg, started last night.  Pt told EMS it is the same pain she had 2 yrs ago when she had a "bacterial infection."  Told EMS she has "low blood sugar" that she treats w/sugar in her coffee in the AM as reported by EMS.

## 2012-01-08 NOTE — ED Provider Notes (Signed)
Medical screening examination/treatment/procedure(s) were performed by non-physician practitioner and as supervising physician I was immediately available for consultation/collaboration.  Lynelle Weiler, MD 01/08/12 0840 

## 2012-01-08 NOTE — ED Provider Notes (Signed)
Medical screening examination/treatment/procedure(s) were performed by non-physician practitioner and as supervising physician I was immediately available for consultation/collaboration.   Geoffery Lyons, MD 01/08/12 [phone]

## 2012-01-09 ENCOUNTER — Emergency Department (HOSPITAL_COMMUNITY): Payer: Self-pay

## 2012-01-09 ENCOUNTER — Other Ambulatory Visit: Payer: Self-pay

## 2012-01-09 ENCOUNTER — Encounter (HOSPITAL_COMMUNITY): Payer: Self-pay | Admitting: Emergency Medicine

## 2012-01-09 ENCOUNTER — Emergency Department (HOSPITAL_COMMUNITY)
Admission: EM | Admit: 2012-01-09 | Discharge: 2012-01-09 | Disposition: A | Discharge status: Home / Self Care | Payer: Self-pay | Attending: Emergency Medicine | Admitting: Emergency Medicine

## 2012-01-09 DIAGNOSIS — J322 Chronic ethmoidal sinusitis: Secondary | ICD-10-CM | POA: Insufficient documentation

## 2012-01-09 DIAGNOSIS — R42 Dizziness and giddiness: Secondary | ICD-10-CM | POA: Insufficient documentation

## 2012-01-09 DIAGNOSIS — R110 Nausea: Secondary | ICD-10-CM | POA: Insufficient documentation

## 2012-01-09 DIAGNOSIS — Z9089 Acquired absence of other organs: Secondary | ICD-10-CM | POA: Insufficient documentation

## 2012-01-09 DIAGNOSIS — R5381 Other malaise: Secondary | ICD-10-CM | POA: Insufficient documentation

## 2012-01-09 DIAGNOSIS — Z1389 Encounter for screening for other disorder: Secondary | ICD-10-CM | POA: Insufficient documentation

## 2012-01-09 DIAGNOSIS — R51 Headache: Secondary | ICD-10-CM | POA: Insufficient documentation

## 2012-01-09 DIAGNOSIS — R55 Syncope and collapse: Secondary | ICD-10-CM | POA: Insufficient documentation

## 2012-01-09 DIAGNOSIS — R197 Diarrhea, unspecified: Secondary | ICD-10-CM | POA: Insufficient documentation

## 2012-01-09 DIAGNOSIS — K429 Umbilical hernia without obstruction or gangrene: Secondary | ICD-10-CM | POA: Insufficient documentation

## 2012-01-09 LAB — COMPREHENSIVE METABOLIC PANEL
AST: 23 U/L (ref 0–37)
Albumin: 3.6 g/dL (ref 3.5–5.2)
Calcium: 9.3 mg/dL (ref 8.4–10.5)
Creatinine, Ser: 0.68 mg/dL (ref 0.50–1.10)
GFR calc non Af Amer: >90 mL/min (ref >90)

## 2012-01-09 LAB — CBC
MCH: 30.2 pg (ref 26.0–34.0)
MCV: 86.7 fL (ref 78.0–100.0)
Platelets: 396 10*3/uL (ref 150–400)
RDW: 13.9 % (ref 11.5–15.5)

## 2012-01-09 LAB — URINALYSIS, ROUTINE W REFLEX MICROSCOPIC
Bilirubin Urine: NEGATIVE
Glucose, UA: NEGATIVE mg/dL
Leukocytes, UA: NEGATIVE
Nitrite: NEGATIVE
Specific Gravity, Urine: 1.005 (ref 1.005–1.030)
pH: 6 (ref 5.0–8.0)

## 2012-01-09 LAB — URINE CULTURE
Colony Count: NO GROWTH
Culture  Setup Time: 201303090240

## 2012-01-09 LAB — CARDIAC PANEL(CRET KIN+CKTOT+MB+TROPI)
CK, MB: 3.2 ng/mL (ref 0.3–4.0)
CK, MB: 3.3 ng/mL (ref 0.3–4.0)
Total CK: 196 U/L — ABNORMAL HIGH (ref 7–177)
Troponin I: <0.3 ng/mL (ref <0.30)
Troponin I: <0.3 ng/mL (ref <0.30)

## 2012-01-09 MED ORDER — IOHEXOL 300 MG/ML  SOLN
80.0000 mL | Freq: Once | INTRAMUSCULAR | Status: AC | PRN
  Administered 2012-01-09: 80 mL via INTRAVENOUS

## 2012-01-09 MED ORDER — SODIUM CHLORIDE 0.9 % IV BOLUS (SEPSIS)
1000.0000 mL | Freq: Once | INTRAVENOUS | Status: AC
  Administered 2012-01-09: 1000 mL via INTRAVENOUS

## 2012-01-09 MED ORDER — DEXTROSE 50 % IV SOLN
25.0000 g | Freq: Once | INTRAVENOUS | Status: AC
  Administered 2012-01-09: 25 g via INTRAVENOUS
  Filled 2012-01-09: qty 50

## 2012-01-09 MED ORDER — PROMETHAZINE HCL 25 MG PO TABS: 25.0000 mg | ORAL_TABLET | Freq: Four times a day (QID) | ORAL | Status: AC | PRN

## 2012-01-09 MED ORDER — POTASSIUM CHLORIDE CRYS ER 20 MEQ PO TBCR
40.0000 meq | EXTENDED_RELEASE_TABLET | Freq: Once | ORAL | Status: AC
  Administered 2012-01-09: 40 meq via ORAL
  Filled 2012-01-09 (×2): qty 1

## 2012-01-09 NOTE — ED Provider Notes (Signed)
Patient turned over and beneath by Dr. Algie Coffer.  Patient was pending a CT abdomen pelvis as well as an MR of her brain and an MRA.  Patient has no acute abdominal pain and is now tolerating by mouth intake.  Her CT scan of her abdomen does not show any acute pathology.  Patient's diarrhea has been intermittent and going on for many weeks to months.  I've advised her that she should followup with a gastroenterologist.  Patient notes that she does not have insurance or primary care doctor and also give her information regarding the [facility].  Patient's MR and her MRA showed no acute abnormalities.  There is a possible question of a 2 mm aneurysm but is unclear at this time.  I feel this is unlikely to be related to the patient's symptoms today.  Patient's dizziness seems to be some lightheadedness upon standing that is now improving.  I will advise the patient has some long-term followup with neurosurgery for this possible aneurysm.  Results for orders placed during the hospital encounter of 01/09/12  CBC      Component Value Range   WBC 10.5  4.0 - 10.5 (K/uL)   RBC 4.57  3.87 - 5.11 (MIL/uL)   Hemoglobin 13.8  12.0 - 15.0 (g/dL)   HCT 40.9  81.1 - 91.4 (%)   MCV 86.7  78.0 - 100.0 (fL)   MCH 30.2  26.0 - 34.0 (pg)   MCHC 34.8  30.0 - 36.0 (g/dL)   RDW 78.2  95.6 - 21.3 (%)   Platelets 396  150 - 400 (K/uL)  COMPREHENSIVE METABOLIC PANEL      Component Value Range   Sodium 131 (*) 135 - 145 (mEq/L)   Potassium 3.3 (*) 3.5 - 5.1 (mEq/L)   Chloride 95 (*) 96 - 112 (mEq/L)   CO2 26  19 - 32 (mEq/L)   Glucose, Bld 55 (*) 70 - 99 (mg/dL)   BUN 5 (*) 6 - 23 (mg/dL)   Creatinine, Ser 0.86  0.50 - 1.10 (mg/dL)   Calcium 9.3  8.4 - 57.8 (mg/dL)   Total Protein 7.3  6.0 - 8.3 (g/dL)   Albumin 3.6  3.5 - 5.2 (g/dL)   AST 23  0 - 37 (U/L)   ALT 16  0 - 35 (U/L)   Alkaline Phosphatase 122 (*) 39 - 117 (U/L)   Total Bilirubin 0.2 (*) 0.3 - 1.2 (mg/dL)   GFR calc non Af Amer >90  >90  (mL/min)   GFR calc Af Amer >90  >90 (mL/min)  CARDIAC PANEL(CRET KIN+CKTOT+MB+TROPI)      Component Value Range   Total CK 186 (*) 7 - 177 (U/L)   CK, MB 3.2  0.3 - 4.0 (ng/mL)   Troponin I <0.30  <0.30 (ng/mL)   Relative Index 1.7  0.0 - 2.5   URINALYSIS, ROUTINE W REFLEX MICROSCOPIC      Component Value Range   Color, Urine STRAW (*) YELLOW    APPearance CLEAR  CLEAR    Specific Gravity, Urine 1.005  1.005 - 1.030    pH 6.0  5.0 - 8.0    Glucose, UA NEGATIVE  NEGATIVE (mg/dL)   Hgb urine dipstick LARGE (*) NEGATIVE    Bilirubin Urine NEGATIVE  NEGATIVE    Ketones, ur NEGATIVE  NEGATIVE (mg/dL)   Protein, ur NEGATIVE  NEGATIVE (mg/dL)   Urobilinogen, UA 0.2  0.0 - 1.0 (mg/dL)   Nitrite NEGATIVE  NEGATIVE    Leukocytes,  UA NEGATIVE  NEGATIVE   URINE MICROSCOPIC-ADD ON      Component Value Range   Squamous Epithelial / LPF RARE  RARE   CARDIAC PANEL(CRET KIN+CKTOT+MB+TROPI)      Component Value Range   Total CK 196 (*) 7 - 177 (U/L)   CK, MB 3.3  0.3 - 4.0 (ng/mL)   Troponin I <0.30  <0.30 (ng/mL)   Relative Index 1.7  0.0 - 2.5   GLUCOSE, CAPILLARY      Component Value Range   Glucose-Capillary 135 (*) 70 - 99 (mg/dL)   Dg Eye Foreign Body  01/09/2012  *RADIOLOGY REPORT*  Clinical Data: Evaluate orbits - pre MRI.  History of metal near eyes.  ORBITS FOR FOREIGN BODY - 2 VIEW  Comparison: None  Findings: There is no evidence of metallic foreign bodies overlying the orbits. The bony structures are unremarkable. No significant abnormalities are identified.  IMPRESSION: No evidence of metallic foreign bodies overlying the orbits.  Original Report Authenticated By: Rosendo Gros, M.D.   Dg Cervical Spine Complete  01/07/2012  *RADIOLOGY REPORT*  Clinical Data: 61 year old female with posterior neck pain.  CERVICAL SPINE - COMPLETE 4+ VIEW  Comparison: 12/10/2008 CT  Findings: Mild straightening of the normal cervical lordosis is again identified. There is no evidence of fracture,  subluxation or prevertebral soft tissue swelling. No significant degenerative changes are noted. The disc spaces are maintained. There is no evidence of bony foraminal narrowing or focal bony lesions.  IMPRESSION: No evidence of acute abnormality.  Stable mild straightening of the normal cervical lordosis without other significant abnormality.  Original Report Authenticated By: Rosendo Gros, M.D.   Ct Head Wo Contrast  01/09/2012  *RADIOLOGY REPORT*  Clinical Data: 61 year old female with persistent dizziness.  CT HEAD WITHOUT CONTRAST  Technique:  Contiguous axial images were obtained from the base of the skull through the vertex without contrast.  Comparison: 12/10/2008  Findings: No acute intracranial abnormalities are identified, including mass lesion or mass effect, hydrocephalus, extra-axial fluid collection, midline shift, hemorrhage, or acute infarction.  The visualized bony calvarium is unremarkable.  Near complete opacification/mucosal thickening of the ethmoid and frontal sinus is noted.  IMPRESSION: No evidence of intracranial abnormality.  Chronic ethmoid/frontal sinusitis.  Original Report Authenticated By: Rosendo Gros, M.D.   Mr Maxine Glenn Head Wo Contrast  01/09/2012  *RADIOLOGY REPORT*  Clinical Data:  Dizziness.  Headache.  MRI HEAD WITHOUT CONTRAST MRA HEAD WITHOUT CONTRAST MRA NECK WITHOUT CONTRAST  Technique:  Multiplanar, multiecho pulse sequences of the brain and surrounding structures were obtained without intravenous contrast. Angiographic images of the Circle of Willis were obtained using MRA technique without intravenous contrast.  Angiographic images of the neck were obtained using MRA technique without intravenous contrast.  Carotid stenosis measurements (when applicable) are obtained utilizing NASCET criteria, using the distal internal carotid diameter as the denominator.  Comparison:  Head CT same day  MRI HEAD  Findings:  The brain has a normal appearance on all pulse sequences  without evidence of malformation, atrophy, old or acute infarction, mass lesion, hemorrhage, hydrocephalus or extra-axial collection. There is frontal and ethmoid sinusitis and some mucosal thickening of the left maxillary sinus.  No skull or skull base lesion.  IMPRESSION: Normal appearance the brain.  Sinusitis  MRA HEAD  Findings: Both internal carotid arteries are widely patent into the brain. The anterior and middle cerebral vessels are patent without stenosis or proximal occlusion.  There is either an infundibulum or a 2  mm aneurysm at the MCA bifurcation on the right.  Both vertebral arteries are patent.  The left is a small vessel that terminates in pica.  The right vertebral is patent to the basilar.  No basilar stenosis.  No posterior circulation branch vessel pathology seen.  IMPRESSION: No major vessel occlusion or correctable proximal stenosis. Infundibulum versus 2 mm aneurysm at the MCA bifurcation on the right.  MRA NECK  Findings: Resolution of noncontrast MR angiography of the neck is limited.  Both common carotid arteries show flow to their respective bifurcation.  No stenotic disease is suspected at either carotid bifurcation.  Right vertebral artery is a large vessel widely patent through the region.  The left tibial artery is a small vessel that does show flow throughout its course.  This probably takes a direct origin from the aortic arch.  IMPRESSION: I think both carotid bifurcations are normal, within the resolution of this noncontrast exam.  Original Report Authenticated By: Thomasenia Sales, M.D.   Mr Angiogram Neck Wo Contrast  01/09/2012  *RADIOLOGY REPORT*  Clinical Data:  Dizziness.  Headache.  MRI HEAD WITHOUT CONTRAST MRA HEAD WITHOUT CONTRAST MRA NECK WITHOUT CONTRAST  Technique:  Multiplanar, multiecho pulse sequences of the brain and surrounding structures were obtained without intravenous contrast. Angiographic images of the Circle of Willis were obtained using MRA technique  without intravenous contrast.  Angiographic images of the neck were obtained using MRA technique without intravenous contrast.  Carotid stenosis measurements (when applicable) are obtained utilizing NASCET criteria, using the distal internal carotid diameter as the denominator.  Comparison:  Head CT same day  MRI HEAD  Findings:  The brain has a normal appearance on all pulse sequences without evidence of malformation, atrophy, old or acute infarction, mass lesion, hemorrhage, hydrocephalus or extra-axial collection. There is frontal and ethmoid sinusitis and some mucosal thickening of the left maxillary sinus.  No skull or skull base lesion.  IMPRESSION: Normal appearance the brain.  Sinusitis  MRA HEAD  Findings: Both internal carotid arteries are widely patent into the brain. The anterior and middle cerebral vessels are patent without stenosis or proximal occlusion.  There is either an infundibulum or a 2 mm aneurysm at the MCA bifurcation on the right.  Both vertebral arteries are patent.  The left is a small vessel that terminates in pica.  The right vertebral is patent to the basilar.  No basilar stenosis.  No posterior circulation branch vessel pathology seen.  IMPRESSION: No major vessel occlusion or correctable proximal stenosis. Infundibulum versus 2 mm aneurysm at the MCA bifurcation on the right.  MRA NECK  Findings: Resolution of noncontrast MR angiography of the neck is limited.  Both common carotid arteries show flow to their respective bifurcation.  No stenotic disease is suspected at either carotid bifurcation.  Right vertebral artery is a large vessel widely patent through the region.  The left tibial artery is a small vessel that does show flow throughout its course.  This probably takes a direct origin from the aortic arch.  IMPRESSION: I think both carotid bifurcations are normal, within the resolution of this noncontrast exam.  Original Report Authenticated By: Thomasenia Sales, M.D.   Mr Brain  Wo Contrast  01/09/2012  *RADIOLOGY REPORT*  Clinical Data:  Dizziness.  Headache.  MRI HEAD WITHOUT CONTRAST MRA HEAD WITHOUT CONTRAST MRA NECK WITHOUT CONTRAST  Technique:  Multiplanar, multiecho pulse sequences of the brain and surrounding structures were obtained without intravenous contrast. Angiographic images of the  Circle of Willis were obtained using MRA technique without intravenous contrast.  Angiographic images of the neck were obtained using MRA technique without intravenous contrast.  Carotid stenosis measurements (when applicable) are obtained utilizing NASCET criteria, using the distal internal carotid diameter as the denominator.  Comparison:  Head CT same day  MRI HEAD  Findings:  The brain has a normal appearance on all pulse sequences without evidence of malformation, atrophy, old or acute infarction, mass lesion, hemorrhage, hydrocephalus or extra-axial collection. There is frontal and ethmoid sinusitis and some mucosal thickening of the left maxillary sinus.  No skull or skull base lesion.  IMPRESSION: Normal appearance the brain.  Sinusitis  MRA HEAD  Findings: Both internal carotid arteries are widely patent into the brain. The anterior and middle cerebral vessels are patent without stenosis or proximal occlusion.  There is either an infundibulum or a 2 mm aneurysm at the MCA bifurcation on the right.  Both vertebral arteries are patent.  The left is a small vessel that terminates in pica.  The right vertebral is patent to the basilar.  No basilar stenosis.  No posterior circulation branch vessel pathology seen.  IMPRESSION: No major vessel occlusion or correctable proximal stenosis. Infundibulum versus 2 mm aneurysm at the MCA bifurcation on the right.  MRA NECK  Findings: Resolution of noncontrast MR angiography of the neck is limited.  Both common carotid arteries show flow to their respective bifurcation.  No stenotic disease is suspected at either carotid bifurcation.  Right vertebral  artery is a large vessel widely patent through the region.  The left tibial artery is a small vessel that does show flow throughout its course.  This probably takes a direct origin from the aortic arch.  IMPRESSION: I think both carotid bifurcations are normal, within the resolution of this noncontrast exam.  Original Report Authenticated By: Thomasenia Sales, M.D.   Ct Abdomen Pelvis W Contrast  01/09/2012  *RADIOLOGY REPORT*  Clinical Data: Vomiting.  Elevated white blood count.  CT ABDOMEN AND PELVIS WITH CONTRAST  Technique:  Multidetector CT imaging of the abdomen and pelvis was performed following the standard protocol during bolus administration of intravenous contrast.  Contrast: 80mL OMNIPAQUE IOHEXOL 300 MG/ML IJ SOLN  Comparison: 12/10/2008  Findings: Post cholecystectomy.  Liver, spleen, pancreas, adrenal glands are within normal limits.  Tiny hypodensities in the kidneys are too small to characterize but likely small cysts.  No free fluid.  No obvious abnormal adenopathy.  Bladder is distended.  Bowel is decompressed.  No pneumatosis.  Umbilical hernia contains only adipose tissue.  IMPRESSION: No acute intra-abdominal pathology.  Bladder is distended.  Original Report Authenticated By: Donavan Burnet, M.D.      Nat Christen, MD 01/09/12 [id number]

## 2012-01-09 NOTE — Discharge Instructions (Signed)
Dizziness Dizziness is a common problem. It is a feeling of unsteadiness or lightheadedness. You may feel like you are about to faint. Dizziness can lead to injury if you stumble or fall. A person of any age group can suffer from dizziness, but dizziness is more common in older adults. CAUSES  Dizziness can be caused by many different things, including:  Middle ear problems.   Standing for too long.   Infections.   An allergic reaction.   Aging.   An emotional response to something, such as the sight of blood.   Side effects of medicines.   Fatigue.   Problems with circulation or blood pressure.   Excess use of alcohol, medicines, or illegal drug use.   Breathing too fast (hyperventilation).   An arrhythmia or problems with your heart rhythm.   Low red blood cell count (anemia).   Pregnancy.   Vomiting, diarrhea, fever, or other illnesses that cause dehydration.   Diseases or conditions such as Parkinson's disease, high blood pressure (hypertension), diabetes, and thyroid problems.   Exposure to extreme heat.  DIAGNOSIS  To find the cause of your dizziness, your caregiver may do a physical exam, lab tests, radiologic imaging scans, or an electrocardiography test (ECG).  TREATMENT  Treatment of dizziness depends on the cause of your symptoms and can vary greatly. HOME CARE INSTRUCTIONS   Drink enough fluids to keep your urine clear or pale yellow. This is especially important in very hot weather. In the elderly, it is also important in cold weather.   If your dizziness is caused by medicines, take them exactly as directed. When taking blood pressure medicines, it is especially important to get up slowly.   Rise slowly from chairs and steady yourself until you feel okay.   In the morning, first sit up on the side of the bed. When this seems okay, stand slowly while holding onto something until you know your balance is fine.   If you need to stand in one place for a  long time, be sure to move your legs often. Tighten and relax the muscles in your legs while standing.   If dizziness continues to be a problem, have someone stay with you for a day or two. Do this until you feel you are well enough to stay alone. Have the person call your caregiver if he or she notices changes in you that are concerning.   Do not drive or use heavy machinery if you feel dizzy.  SEEK IMMEDIATE MEDICAL CARE IF:   Your dizziness or lightheadedness gets worse.   You feel nauseous or vomit.   You develop problems with talking, walking, weakness, or using your arms, hands, or legs.   You are not thinking clearly or you have difficulty forming sentences. It may take a friend or family member to determine if your thinking is normal.   You develop chest pain, abdominal pain, shortness of breath, or sweating.   Your vision changes.   You notice any bleeding.   You have side effects from medicine that seems to be getting worse rather than better.  MAKE SURE YOU:   Understand these instructions.   Will watch your condition.   Will get help right away if you are not doing well or get worse.  Document Released: 04/13/2001 Document Revised: 10/07/2011 Document Reviewed: 05/07/2011 [employer] Patient Information 2012 Ex  Diarrhea Infections caused by germs (bacterial) or a virus commonly cause diarrhea. Your caregiver has determined that with time, rest  and fluids, the diarrhea should improve. In general, eat normally while drinking more water than usual. Although water may prevent dehydration, it does not contain salt and minerals (electrolytes). Broths, weak tea without caffeine and oral rehydration solutions (ORS) replace fluids and electrolytes. Small amounts of fluids should be taken frequently. Large amounts at one time may not be tolerated. Plain water may be harmful in infants and the elderly. Oral rehydrating solutions (ORS) are available at pharmacies and grocery stores.  ORS replace water and important electrolytes in proper proportions. Sports drinks are not as effective as ORS and may be harmful due to sugars worsening diarrhea.  ORS is especially recommended for use in children with diarrhea. As a general guideline for children, replace any new fluid losses from diarrhea and/or vomiting with ORS as follows:   If your child weighs 22 pounds or under (10 kg or less), give 60-120 mL ( -  cup or 2 - 4 ounces) of ORS for each episode of diarrheal stool or vomiting episode.   If your child weighs more than 22 pounds (more than 10 kgs), give 120-240 mL ( - 1 cup or 4 - 8 ounces) of ORS for each diarrheal stool or episode of vomiting.   While correcting for dehydration, children should eat normally. However, foods high in sugar should be avoided because this may worsen diarrhea. Large amounts of carbonated soft drinks, juice, gelatin desserts and other highly sugared drinks should be avoided.   After correction of dehydration, other liquids that are appealing to the child may be added. Children should drink small amounts of fluids frequently and fluids should be increased as tolerated. Children should drink enough fluids to keep urine clear or pale yellow.   Adults should eat normally while drinking more fluids than usual. Drink small amounts of fluids frequently and increase as tolerated. Drink enough fluids to keep urine clear or pale yellow. Broths, weak decaffeinated tea, lemon lime soft drinks (allowed to go flat) and ORS replace fluids and electrolytes.   Avoid:   Carbonated drinks.   Juice.   Extremely hot or cold fluids.   Caffeine drinks.   Fatty, greasy foods.   Alcohol.   Tobacco.   Too much intake of anything at one time.   Gelatin desserts.   Probiotics are active cultures of beneficial bacteria. They may lessen the amount and number of diarrheal stools in adults. Probiotics can be found in yogurt with active cultures and in supplements.     Wash hands well to avoid spreading bacteria and virus.   Anti-diarrheal medications are not recommended for infants and children.   Only take over-the-counter or prescription medicines for pain, discomfort or fever as directed by your caregiver. Do not give aspirin to children because it may cause Reye's Syndrome.   For adults, ask your caregiver if you should continue all prescribed and over-the-counter medicines.   If your caregiver has given you a follow-up appointment, it is very important to keep that appointment. Not keeping the appointment could result in a chronic or permanent injury, and disability. If there is any problem keeping the appointment, you must call back to this facility for assistance.  SEEK IMMEDIATE MEDICAL CARE IF:   You or your child is unable to keep fluids down or other symptoms or problems become worse in spite of treatment.   Vomiting or diarrhea develops and becomes persistent.   There is vomiting of blood or bile (green material).   There is blood in the  stool or the stools are black and tarry.   There is no urine output in 6-8 hours or there is only a small amount of very dark urine.   Abdominal pain develops, increases or localizes.   You have a fever.   Your baby is older than 3 months with a rectal temperature of 102 F (38.9 C) or higher.   Your baby is 22 months old or younger with a rectal temperature of 100.4 F (38 C) or higher.   You or your child develops excessive weakness, dizziness, fainting or extreme thirst.   You or your child develops a rash, stiff neck, severe headache or become irritable or sleepy and difficult to awaken.  MAKE SURE YOU:   Understand these instructions.   Will watch your condition.   Will get help right away if you are not doing well or get worse.  Document Released: 10/08/2002 Document Revised: 10/07/2011 Document Reviewed: 08/25/2009 [facility] Patient Information [address] [place], [place].

## 2012-01-09 NOTE — ED Notes (Signed)
Becomes short of breath when laying flat, when walking for any distance, c/o legs hurt when walking, pulse ox= 97-100% on RA. Waiting results.

## 2012-01-09 NOTE — ED Notes (Signed)
Pt was seen 2 days ago for dizziness and diarrhea

## 2012-01-09 NOTE — ED Provider Notes (Signed)
History     CSN: [id number]  Arrival date & time 01/09/12  1212   First MD Initiated Contact with Patient 01/09/12 1243      Chief Complaint  Patient presents with  . Dizziness     low sodium, weakness, loose stools x6 weeks    (Consider location/radiation/quality/duration/timing/severity/associated sxs/prior treatment) HPI Comments: Patient presents for reevaluation of lightheadedness, dizziness and diarrhea. She was seen 2 days ago for lightheadedness, nausea, vomiting and diarrhea. She is told to return today for recheck of her labs as her sodium was low at 127. On further history taking she says she's had diarrhea for about 6 weeks 33 episodes of loose stools daily. She has become lightheaded over the past 2 days especially with position changes and feels like she is going to pass out. She denies vertigo, chest pain, shortness of breath, abdominal pain. She's not had any vomiting for the past 3 days. Denies fever, urinary symptoms, vaginal bleeding., Blood in the stool.  The history is provided by the patient and a relative.    Past Medical History  Diagnosis Date  . Migraines   . Cervical cancer     Past Surgical History  Procedure Date  . Cholecystectomy   . Shoulder surgery   . Abdominal hysterectomy   . Appendectomy   . Tubal ligation     Family History  Problem Relation Age of Onset  . Cancer Mother   . Diabetes Sister   . Cancer Sister   . Cancer Father   . Cancer Brother   . Cancer Other     History  Substance Use Topics  . Smoking status: Current Everyday Smoker -- 1.0 packs/day    Types: Cigarettes  . Smokeless tobacco: Not on file  . Alcohol Use: No    OB History    Grav Para Term Preterm Abortions TAB SAB Ect Mult Living                  Review of Systems  Constitutional: Positive for activity change, appetite change and fatigue. Negative for fever.  HENT: Negative for congestion and rhinorrhea.   Eyes: Negative for visual disturbance.    Respiratory: Negative for cough, chest tightness and shortness of breath.   Cardiovascular: Negative for chest pain.  Gastrointestinal: Positive for nausea and diarrhea. Negative for vomiting.  Genitourinary: Negative for dysuria and hematuria.  Musculoskeletal: Negative for back pain.  Neurological: Positive for dizziness and light-headedness. Negative for syncope, weakness and headaches.    Allergies  Naproxen sodium  Home Medications   Current Outpatient Rx  Name Route Sig Dispense Refill  . ACETAMINOPHEN 500 MG PO TABS Oral Take 1,000-1,500 mg by mouth every 6 (six) hours as needed. pain      BP 128/80  Pulse 75  Temp 97.4 F (36.3 C)  Resp 18  SpO2 100%  Physical Exam  Constitutional: She is oriented to person, place, and time. She appears well-developed and well-nourished. No distress.  HENT:  Head: Normocephalic and atraumatic.  Mouth/Throat: Oropharynx is clear and moist. No oropharyngeal exudate.  Eyes: Conjunctivae are normal. Pupils are equal, round, and reactive to light.  Neck: Normal range of motion. Neck supple.  Cardiovascular: Normal rate, regular rhythm and normal heart sounds.   Pulmonary/Chest: Effort normal and breath sounds normal. No respiratory distress.  Abdominal: Soft. There is no tenderness. There is no rebound and no guarding.  Musculoskeletal: Normal range of motion. She exhibits no edema and no tenderness.  Neurological: She  is alert and oriented to person, place, and time. No cranial nerve deficit.       5/5 strength throughout, cranial nerves 2-12 intact.  No ataxia on finger to nose, no pronator drift. Normal gait. No nystagmus.  Test of skew negative.  Head impulse testing normal.  Skin: Skin is warm.    ED Course  Procedures (including critical care time)  Labs Reviewed  COMPREHENSIVE METABOLIC PANEL - Abnormal; Notable for the following:    Sodium 131 (*)    Potassium 3.3 (*)    Chloride 95 (*)    Glucose, Bld 55 (*)    BUN 5  (*)    Alkaline Phosphatase 122 (*)    Total Bilirubin 0.2 (*)    All other components within normal limits  CARDIAC PANEL(CRET KIN+CKTOT+MB+TROPI) - Abnormal; Notable for the following:    Total CK 186 (*)    All other components within normal limits  URINALYSIS, ROUTINE W REFLEX MICROSCOPIC - Abnormal; Notable for the following:    Color, Urine STRAW (*)    Hgb urine dipstick LARGE (*)    All other components within normal limits  GLUCOSE, CAPILLARY - Abnormal; Notable for the following:    Glucose-Capillary 135 (*)    All other components within normal limits  CBC  URINE MICROSCOPIC-ADD ON  CARDIAC PANEL(CRET KIN+CKTOT+MB+TROPI)  CARDIAC PANEL(CRET KIN+CKTOT+MB+TROPI)   Dg Eye Foreign Body  01/09/2012  *RADIOLOGY REPORT*  Clinical Data: Evaluate orbits - pre MRI.  History of metal near eyes.  ORBITS FOR FOREIGN BODY - 2 VIEW  Comparison: None  Findings: There is no evidence of metallic foreign bodies overlying the orbits. The bony structures are unremarkable. No significant abnormalities are identified.  IMPRESSION: No evidence of metallic foreign bodies overlying the orbits.  Original Report Authenticated By: Rosendo Gros, M.D.   Ct Head Wo Contrast  01/09/2012  *RADIOLOGY REPORT*  Clinical Data: 61 year old female with persistent dizziness.  CT HEAD WITHOUT CONTRAST  Technique:  Contiguous axial images were obtained from the base of the skull through the vertex without contrast.  Comparison: 12/10/2008  Findings: No acute intracranial abnormalities are identified, including mass lesion or mass effect, hydrocephalus, extra-axial fluid collection, midline shift, hemorrhage, or acute infarction.  The visualized bony calvarium is unremarkable.  Near complete opacification/mucosal thickening of the ethmoid and frontal sinus is noted.  IMPRESSION: No evidence of intracranial abnormality.  Chronic ethmoid/frontal sinusitis.  Original Report Authenticated By: Rosendo Gros, M.D.   Mr Maxine Glenn Head  Wo Contrast  01/09/2012  *RADIOLOGY REPORT*  Clinical Data:  Dizziness.  Headache.  MRI HEAD WITHOUT CONTRAST MRA HEAD WITHOUT CONTRAST MRA NECK WITHOUT CONTRAST  Technique:  Multiplanar, multiecho pulse sequences of the brain and surrounding structures were obtained without intravenous contrast. Angiographic images of the Circle of Willis were obtained using MRA technique without intravenous contrast.  Angiographic images of the neck were obtained using MRA technique without intravenous contrast.  Carotid stenosis measurements (when applicable) are obtained utilizing NASCET criteria, using the distal internal carotid diameter as the denominator.  Comparison:  Head CT same day  MRI HEAD  Findings:  The brain has a normal appearance on all pulse sequences without evidence of malformation, atrophy, old or acute infarction, mass lesion, hemorrhage, hydrocephalus or extra-axial collection. There is frontal and ethmoid sinusitis and some mucosal thickening of the left maxillary sinus.  No skull or skull base lesion.  IMPRESSION: Normal appearance the brain.  Sinusitis  MRA HEAD  Findings: Both internal carotid  arteries are widely patent into the brain. The anterior and middle cerebral vessels are patent without stenosis or proximal occlusion.  There is either an infundibulum or a 2 mm aneurysm at the MCA bifurcation on the right.  Both vertebral arteries are patent.  The left is a small vessel that terminates in pica.  The right vertebral is patent to the basilar.  No basilar stenosis.  No posterior circulation branch vessel pathology seen.  IMPRESSION: No major vessel occlusion or correctable proximal stenosis. Infundibulum versus 2 mm aneurysm at the MCA bifurcation on the right.  MRA NECK  Findings: Resolution of noncontrast MR angiography of the neck is limited.  Both common carotid arteries show flow to their respective bifurcation.  No stenotic disease is suspected at either carotid bifurcation.  Right vertebral  artery is a large vessel widely patent through the region.  The left tibial artery is a small vessel that does show flow throughout its course.  This probably takes a direct origin from the aortic arch.  IMPRESSION: I think both carotid bifurcations are normal, within the resolution of this noncontrast exam.  Original Report Authenticated By: Thomasenia Sales, M.D.   Mr Angiogram Neck Wo Contrast  01/09/2012  *RADIOLOGY REPORT*  Clinical Data:  Dizziness.  Headache.  MRI HEAD WITHOUT CONTRAST MRA HEAD WITHOUT CONTRAST MRA NECK WITHOUT CONTRAST  Technique:  Multiplanar, multiecho pulse sequences of the brain and surrounding structures were obtained without intravenous contrast. Angiographic images of the Circle of Willis were obtained using MRA technique without intravenous contrast.  Angiographic images of the neck were obtained using MRA technique without intravenous contrast.  Carotid stenosis measurements (when applicable) are obtained utilizing NASCET criteria, using the distal internal carotid diameter as the denominator.  Comparison:  Head CT same day  MRI HEAD  Findings:  The brain has a normal appearance on all pulse sequences without evidence of malformation, atrophy, old or acute infarction, mass lesion, hemorrhage, hydrocephalus or extra-axial collection. There is frontal and ethmoid sinusitis and some mucosal thickening of the left maxillary sinus.  No skull or skull base lesion.  IMPRESSION: Normal appearance the brain.  Sinusitis  MRA HEAD  Findings: Both internal carotid arteries are widely patent into the brain. The anterior and middle cerebral vessels are patent without stenosis or proximal occlusion.  There is either an infundibulum or a 2 mm aneurysm at the MCA bifurcation on the right.  Both vertebral arteries are patent.  The left is a small vessel that terminates in pica.  The right vertebral is patent to the basilar.  No basilar stenosis.  No posterior circulation branch vessel pathology  seen.  IMPRESSION: No major vessel occlusion or correctable proximal stenosis. Infundibulum versus 2 mm aneurysm at the MCA bifurcation on the right.  MRA NECK  Findings: Resolution of noncontrast MR angiography of the neck is limited.  Both common carotid arteries show flow to their respective bifurcation.  No stenotic disease is suspected at either carotid bifurcation.  Right vertebral artery is a large vessel widely patent through the region.  The left tibial artery is a small vessel that does show flow throughout its course.  This probably takes a direct origin from the aortic arch.  IMPRESSION: I think both carotid bifurcations are normal, within the resolution of this noncontrast exam.  Original Report Authenticated By: Thomasenia Sales, M.D.   Mr Brain Wo Contrast  01/09/2012  *RADIOLOGY REPORT*  Clinical Data:  Dizziness.  Headache.  MRI HEAD WITHOUT CONTRAST MRA  HEAD WITHOUT CONTRAST MRA NECK WITHOUT CONTRAST  Technique:  Multiplanar, multiecho pulse sequences of the brain and surrounding structures were obtained without intravenous contrast. Angiographic images of the Circle of Willis were obtained using MRA technique without intravenous contrast.  Angiographic images of the neck were obtained using MRA technique without intravenous contrast.  Carotid stenosis measurements (when applicable) are obtained utilizing NASCET criteria, using the distal internal carotid diameter as the denominator.  Comparison:  Head CT same day  MRI HEAD  Findings:  The brain has a normal appearance on all pulse sequences without evidence of malformation, atrophy, old or acute infarction, mass lesion, hemorrhage, hydrocephalus or extra-axial collection. There is frontal and ethmoid sinusitis and some mucosal thickening of the left maxillary sinus.  No skull or skull base lesion.  IMPRESSION: Normal appearance the brain.  Sinusitis  MRA HEAD  Findings: Both internal carotid arteries are widely patent into the brain. The anterior  and middle cerebral vessels are patent without stenosis or proximal occlusion.  There is either an infundibulum or a 2 mm aneurysm at the MCA bifurcation on the right.  Both vertebral arteries are patent.  The left is a small vessel that terminates in pica.  The right vertebral is patent to the basilar.  No basilar stenosis.  No posterior circulation branch vessel pathology seen.  IMPRESSION: No major vessel occlusion or correctable proximal stenosis. Infundibulum versus 2 mm aneurysm at the MCA bifurcation on the right.  MRA NECK  Findings: Resolution of noncontrast MR angiography of the neck is limited.  Both common carotid arteries show flow to their respective bifurcation.  No stenotic disease is suspected at either carotid bifurcation.  Right vertebral artery is a large vessel widely patent through the region.  The left tibial artery is a small vessel that does show flow throughout its course.  This probably takes a direct origin from the aortic arch.  IMPRESSION: I think both carotid bifurcations are normal, within the resolution of this noncontrast exam.  Original Report Authenticated By: Thomasenia Sales, M.D.   Ct Abdomen Pelvis W Contrast  01/09/2012  *RADIOLOGY REPORT*  Clinical Data: Vomiting.  Elevated white blood count.  CT ABDOMEN AND PELVIS WITH CONTRAST  Technique:  Multidetector CT imaging of the abdomen and pelvis was performed following the standard protocol during bolus administration of intravenous contrast.  Contrast: 80mL OMNIPAQUE IOHEXOL 300 MG/ML IJ SOLN  Comparison: 12/10/2008  Findings: Post cholecystectomy.  Liver, spleen, pancreas, adrenal glands are within normal limits.  Tiny hypodensities in the kidneys are too small to characterize but likely small cysts.  No free fluid.  No obvious abnormal adenopathy.  Bladder is distended.  Bowel is decompressed.  No pneumatosis.  Umbilical hernia contains only adipose tissue.  IMPRESSION: No acute intra-abdominal pathology.  Bladder is  distended.  Original Report Authenticated By: Donavan Burnet, M.D.     1. Dizziness   2. Near syncope   3. Diarrhea       MDM  Generalized weakness, lightheadedness, dizziness diarrhea. Vital signs stable, no distress. Neurological exam intact.  Sodium 131 from 127.  K 3.3.  Slight increase in heart rate with standing. Hemoglobin stable.  Given the patient's persistent dizziness, lightheadedness and ataxia, obtain MRI of the brain to rule out CVA. Patient continues to complain of abdominal pain. With persistent diarrhea, will obtain CT imaging.  Diagnostic imaging pending at time of sign out to Dr. Golda Acre.  Anticipate discharge home if unremarkable.    Date: 01/09/2012  Rate: 68  Rhythm: normal sinus rhythm  QRS Axis: normal  Intervals: normal  ST/T Wave abnormalities: normal  Conduction Disutrbances:none  Narrative Interpretation:   Old EKG Reviewed: unchanged      Glynn Octave, MD 01/09/12 1752

## 2012-01-09 NOTE — ED Notes (Signed)
Here for lab recheck seen here on Friday  For same, diarrhea for "months" patient states, no vomiting. Alert, oriented x 3, states has a problem walking--"feels funny--like I am drunk sometimes/.//"

## 2012-01-09 NOTE — ED Notes (Signed)
To MRI

## 2012-01-09 NOTE — ED Notes (Signed)
Ambulated to BR- liquid stool- reddish in color- 2nd in 30 minutes. Dr. Golda Acre notified

## 2012-01-09 NOTE — ED Notes (Signed)
Family at bedside. Pt c/o persistant dizziness PA requested upgrade evaluation Labs ordered

## 2012-01-09 NOTE — ED Notes (Signed)
Daughter is driving home.

## 2013-04-16 ENCOUNTER — Other Ambulatory Visit: Payer: Self-pay | Admitting: *Deleted

## 2013-04-16 DIAGNOSIS — N63 Unspecified lump in unspecified breast: Secondary | ICD-10-CM

## 2013-04-17 ENCOUNTER — Ambulatory Visit (HOSPITAL_COMMUNITY)
Admission: RE | Admit: 2013-04-17 | Discharge: 2013-04-17 | Disposition: A | Discharge status: Home / Self Care | Payer: Self-pay | Source: Ambulatory Visit | Attending: Obstetrics and Gynecology | Admitting: Obstetrics and Gynecology

## 2013-04-17 ENCOUNTER — Ambulatory Visit (HOSPITAL_COMMUNITY): Payer: Self-pay

## 2013-04-17 ENCOUNTER — Encounter (HOSPITAL_COMMUNITY): Payer: Self-pay

## 2013-04-17 ENCOUNTER — Other Ambulatory Visit: Payer: Self-pay | Admitting: Obstetrics and Gynecology

## 2013-04-17 VITALS — BP 100/64 | Temp 98.1°F | Ht 61.0 in | Wt 128.6 lb

## 2013-04-17 DIAGNOSIS — Z01419 Encounter for gynecological examination (general) (routine) without abnormal findings: Secondary | ICD-10-CM

## 2013-04-17 DIAGNOSIS — N898 Other specified noninflammatory disorders of vagina: Secondary | ICD-10-CM

## 2013-04-17 DIAGNOSIS — Z0000 Encounter for general adult medical examination without abnormal findings: Secondary | ICD-10-CM

## 2013-04-17 NOTE — Patient Instructions (Signed)
Taught Deborah Arroyo how to perform BSE and gave educational materials to take home. Informed patient that depending on the result to today's Pap smear will determine if she needs any further Pap smears or when the next one will be. Let patient know will follow up with her within the next couple weeks with results by letter or phone. Doristine Johns verbalized understanding. Patient escorted to mammography for a screening mammogram.  Jessicia Napolitano, Kathaleen Maser, RN @T @ 1:07 PM

## 2013-04-17 NOTE — Progress Notes (Signed)
Complaints of bilateral breast soreness x 1 year.  Pap Smear:    Completed Pap smear today. Per patient last Pap smear was around 1979 and abnormal. Per patient has a history of cervical cancer. Patient has a history of a hysterectomy in 1979 that patient states was due to cervical cancer. No Pap smear results in EPIC.  Physical exam: Breasts Breasts symmetrical. No skin abnormalities bilateral breasts. No nipple retraction bilateral breasts. No nipple discharge bilateral breasts. No lymphadenopathy. No lumps palpated bilateral breasts. Patient complained of bilateral outer breast tenderness. Patient escorted to mammography for a screening mammogram.       Pelvic/Bimanual   Ext Genitalia No lesions, no swelling and no discharge observed on external genitalia.         Vagina Vagina pink and normal texture. No lesions and white creamy discharge observed in vagina. Wet prep completed.          Cervix Cervix is absent due to a history of a hysterectomy.          Uterus Uterus is absent due to a history of a hysterectomy.          Adnexae Bilateral ovaries are absent due to a history of a hysterectomy.          Rectovaginal No rectal exam completed today since patient had no rectal complaints. Large hemorrhoid observed on rectal area.

## 2013-04-17 NOTE — Addendum Note (Signed)
Encounter addended by: Lurlean Horns, LPN on: 1/61/0960  4:01 PM<BR>     Documentation filed: Orders

## 2013-04-18 LAB — WET PREP, GENITAL: Yeast Wet Prep HPF POC: NONE SEEN

## 2013-04-23 ENCOUNTER — Telehealth (HOSPITAL_COMMUNITY): Payer: Self-pay | Admitting: *Deleted

## 2013-04-23 ENCOUNTER — Other Ambulatory Visit: Payer: Self-pay | Admitting: *Deleted

## 2013-04-23 DIAGNOSIS — B9689 Other specified bacterial agents as the cause of diseases classified elsewhere: Secondary | ICD-10-CM

## 2013-04-23 MED ORDER — METRONIDAZOLE 500 MG PO TABS: 500.0000 mg | ORAL_TABLET | Freq: Two times a day (BID) | ORAL | Status: DC

## 2013-04-23 NOTE — Telephone Encounter (Signed)
Telephoned patient at home # and discussed negative results of pap smear. Advised patient wet prep did show bacterial vaginosis and medication was called in to her pharmacy. Also mammogram was benign and next screening mammogram in one year. Patient voiced understanding.

## 2013-04-27 ENCOUNTER — Other Ambulatory Visit: Payer: Self-pay

## 2014-03-28 ENCOUNTER — Other Ambulatory Visit: Payer: Self-pay

## 2014-03-28 ENCOUNTER — Ambulatory Visit (HOSPITAL_BASED_OUTPATIENT_CLINIC_OR_DEPARTMENT_OTHER): Payer: Self-pay

## 2014-03-28 VITALS — BP 120/82 | HR 70 | Temp 97.8°F | Resp 16 | Ht 59.5 in | Wt 123.1 lb

## 2014-03-28 DIAGNOSIS — Z0000 Encounter for general adult medical examination without abnormal findings: Secondary | ICD-10-CM

## 2014-03-28 LAB — GLUCOSE (CC13): Glucose: 95 mg/dl (ref 70–140)

## 2014-03-28 LAB — HEMOGLOBIN A1C
HEMOGLOBIN A1C: 5.8 % — AB (ref <5.7)
MEAN PLASMA GLUCOSE: 120 mg/dL — AB (ref <117)

## 2014-03-28 LAB — LIPID PANEL
CHOLESTEROL: 199 mg/dL (ref 0–200)
HDL: 60 mg/dL (ref >39)
LDL Cholesterol: 113 mg/dL — ABNORMAL HIGH (ref 0–99)
TRIGLYCERIDES: 129 mg/dL (ref <150)
Total CHOL/HDL Ratio: 3.3 Ratio
VLDL: 26 mg/dL (ref 0–40)

## 2014-03-28 NOTE — Patient Instructions (Signed)
.   She will be called with results of lab work and we will then discussed any further follow up the patient needs. Patient verbalized understanding.

## 2014-03-28 NOTE — Progress Notes (Signed)
Patient is a new patient to the [facility] program and is currently a [facility] patient effective 04/17/2013.   Clinical Measurements: Patient is 4 ft.11.5 inches, weight 123.1 lbs, waist circumference 30.5 inches, and hip circumference 39 inches.   Medical History: Patient has no history of high cholesterol. Patient does not have a history of hypertension or diabetes. Per patient has low blood sugar. Per patient no diagnosed history of coronary heart disease, heart attack, heart failure, stroke/TIA, vascular disease or congenital heart defects.   Blood Pressure, Self-measurement: Patient states has no reason to check Blood pressure.  Nutrition Assessment: Patient stated that eats 0 fruits every day. Patient states she eats zero servings of vegetables a day.Patients states does not eat 3 or more ounces of whole grains daily. Patient doesn't eat two or more servings of fish weekly.Patient states she does  drink more than 36 ounces or 450 calories of beverages with added sugars weekly. Patient states she drinks  Sweetened coffee and tea daily. Patient stated she does not watch her salt intake.   Physical Activity Assessment: Patient stated she cleans, cust lawn and walks about 1680 minutes a week. States does not do vigorous exercise.  Smoking Status: Patient currently smokes about one and half packs of cigarettes a day. She smokes in her house and stated that she has no desire to quit now because of her nervousness. . Quality of Life Assessment: In assessing patient's quality of life she stated that out of the past 30 days that she has felt her health was not good 20 of them. Patient also stated that in the past 30 days that her mental health is not good including stress, depression and problems with emotions for 3 days. Patient did state that out of the past 30 days she felt her physical or mental health had not kept her from doing her usual activities including self-care, work or recreation for about 2  to 3 days.   Plan: Lab work will be done today including a lipid panel, blood glucose, and Hgb A1C. Will call lab results when they are finished. Patient will return for Health Coaching.

## 2014-03-29 ENCOUNTER — Telehealth: Payer: Self-pay

## 2014-03-29 NOTE — Telephone Encounter (Signed)
Called patient lab results: Bld. Glucose - 95, HgbA1C - 5.8, Cholesterol 199, HDL - 60, LDL - 113, Triglycerides - 129. Discussed the importance of exercise and fiber to improve LDL's. Set up appointment for health coaching to work on Hgb A1C. To get out of prediabetes range. Stated she understood.

## 2014-04-18 ENCOUNTER — Ambulatory Visit: Payer: Self-pay

## 2014-04-18 DIAGNOSIS — Z789 Other specified health status: Secondary | ICD-10-CM

## 2014-04-18 NOTE — Progress Notes (Signed)
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   NUTRITION: Per patient does not eat much and mainly eats yogurt. Patient voices disbelief because had been told had low blood sugar in the past. When questioned patient it had been over ten years since had had any difficulty. Discussed carbohydrate counting, picking better carbohydrates(complex) and other nutritional ways to get A1C below 5.7. Discussed needed to eat more and eat better. Discussed reading labels of food products. Discussed that needed to eat more complex carbohydrate with increase fiber count and exercise to decrease LDL's. Patient received handouts : What's on your Plate, Carbohydrate list,and Carb Counting sheet, stress, cholesterol.  ACTIVITY: Discussed walking three times a week.   MISCELLANEOUS: Discussed getting primary care doctor by obtaining orange card. Patient voiced ,not now that will discuss when she returns.  PLAN: Patient will keep a food diary for looking at carbohydrate count with sheet given.  Patient will keep carbohydrate count below 200 GM.  Patient will call in one to two weeks for Health Coaching follow up. Will increase fiber content to food and use complex carbohydrates.Will increase activity. Will discuss obtaining orange card when I follow up.

## 2014-04-18 NOTE — Patient Instructions (Signed)
Eat less than 200 GM carbohydrates a day (45-60 carbs per meal); Eat three meals a day. Keep a diary of what eat for a week; walk three times a week

## 2014-05-15 ENCOUNTER — Telehealth: Payer: Self-pay

## 2014-05-15 NOTE — Telephone Encounter (Signed)
Made an appointment for 7/20 at 9:45 AM.  PLAN: [place] and reviewing carbohydrate counting. Going over process and paper work for obtaining an orange card.

## 2014-05-20 ENCOUNTER — Ambulatory Visit: Payer: Self-pay

## 2014-05-20 DIAGNOSIS — Z789 Other specified health status: Secondary | ICD-10-CM

## 2014-05-20 NOTE — Progress Notes (Signed)
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   NUTRITION: Patient showed me her food diary that she had kept for a couple of days. Diary showed that was only eating one meal per day which was like a kids meal of a hamburger or piece of veggie pizza and the rest was coffee with sugar and cream. Patient stated could not understand why she weighed so much. Discussed changing sugar to a sugar substitute, why need to eat more because of how affects the body and trying to eat at least a 1400 calorie diet. Handouts and examples of meals given.  Miscellaneous: Handout given as to when and where can get orange card. Application given for orange card. Patient and I went over what was needed and where to obtain the information. Marland Kitchen  PLAN: Radiographer, therapeutic Card ([place]). Improve diet pattern and decrease sugar. Will call and Follow up with patient in a couple of weeks.

## 2014-05-20 NOTE — Patient Instructions (Addendum)
Will stop using sugar in coffee and use sugar substitute instead. Apply for orange card.  Will follow 1400 calorie diet plan

## 2014-07-03 ENCOUNTER — Telehealth: Payer: Self-pay

## 2014-07-03 NOTE — Telephone Encounter (Signed)
Called patient to follow up on eating patterns and carbohydrate intake. Patient stated that had not really improved her pattern of eating. Per patient is watching carbohydrates but stated that was hard to watch when goes out. Patient and I discussed that there were choices. It was her choice what she chooses. Stated that hadn't gone yet to seek primary care but still planned to. I explained that as long as she qualifies for [facility] that I will re evaluate next year and re assess in about a month.  PLAN: Call in two to three weeks for re assessment and in year for re evaluation.

## 2014-07-03 NOTE — Telephone Encounter (Signed)
Calling to see if obtain [employer] and how eating was going.

## 2014-10-30 ENCOUNTER — Telehealth: Payer: Self-pay

## 2014-10-30 NOTE — Telephone Encounter (Signed)
Patient returned call from message left. Patient voiced depression from all of recent events that have happened. I discussed that [facility] had counseling and an [facility] that could help her mentally and physically. Information sent to patient per mail as patient requested. Patient's assessment shows improvement with eating at least one fruit and vegetable a day. Patient stated that was not drinking sugary drinks and is watching salt intake.Per patient she does not feel like doing anything. Will re screen in springtime.

## 2014-10-30 NOTE — Telephone Encounter (Signed)
Left message to return call if do not hear from in a couple of days will try another number.

## 2015-09-04 ENCOUNTER — Ambulatory Visit (INDEPENDENT_AMBULATORY_CARE_PROVIDER_SITE_OTHER): Payer: Medicare Other | Admitting: Internal Medicine

## 2015-09-04 ENCOUNTER — Encounter: Payer: Self-pay | Admitting: Internal Medicine

## 2015-09-04 VITALS — BP 136/74 | HR 73 | Temp 97.6°F | Ht 61.0 in | Wt 116.9 lb

## 2015-09-04 DIAGNOSIS — F17210 Nicotine dependence, cigarettes, uncomplicated: Secondary | ICD-10-CM | POA: Diagnosis not present

## 2015-09-04 DIAGNOSIS — E2839 Other primary ovarian failure: Secondary | ICD-10-CM

## 2015-09-04 DIAGNOSIS — Z8541 Personal history of malignant neoplasm of cervix uteri: Secondary | ICD-10-CM | POA: Diagnosis not present

## 2015-09-04 DIAGNOSIS — Z0000 Encounter for general adult medical examination without abnormal findings: Secondary | ICD-10-CM | POA: Diagnosis not present

## 2015-09-04 DIAGNOSIS — Z0189 Encounter for other specified special examinations: Secondary | ICD-10-CM | POA: Diagnosis not present

## 2015-09-04 DIAGNOSIS — Z720 Tobacco use: Secondary | ICD-10-CM

## 2015-09-04 DIAGNOSIS — Z90710 Acquired absence of both cervix and uterus: Secondary | ICD-10-CM | POA: Diagnosis not present

## 2015-09-04 LAB — BASIC METABOLIC PANEL
Anion gap: 6 (ref 5–15)
BUN: 10 mg/dL (ref 6–20)
CO2: 28 mmol/L (ref 22–32)
CREATININE: 0.77 mg/dL (ref 0.44–1.00)
Calcium: 9.6 mg/dL (ref 8.9–10.3)
Chloride: 99 mmol/L — ABNORMAL LOW (ref 101–111)
Glucose, Bld: 94 mg/dL (ref 65–99)
POTASSIUM: 4.2 mmol/L (ref 3.5–5.1)
SODIUM: 133 mmol/L — AB (ref 135–145)

## 2015-09-04 NOTE — Assessment & Plan Note (Addendum)
Deborah Arroyo has rarely been seen by physicians in the past and has little preventative health care done.   She does have a 1st degree relative (daughter) who had breast cancer (age 65) putting her at higher risk for breast cancer. Will get her scheduled to have a mammogram done.   She is a current every day smoker. Reports having a DEXA in the past that was borderline. Given her smoking history as well as being a 64 year old thin woman, will get her set up to have DEXA scan done. Will also check a lipid panel today to assess her CV risk given her smoking history. Will check BMET today to assess her renal function.   Patient declined the flu shot today as well as the PNA vaccine.

## 2015-09-04 NOTE — Progress Notes (Signed)
Internal Medicine Clinic Attending  I saw and evaluated the patient.  I personally confirmed the key portions of the history and exam documented by Dr. Charlynn Grimes and I reviewed pertinent patient test results.  The assessment, diagnosis, and plan were formulated together and I agree with the documentation in the resident's note.  I personally spent greater than 3 minutes counseling on tobacco cessation, including available pharmacotherapy and nicotine replacement therapy. Elevated blood pressure also assessed, likely pre-hypertensive and related to tobacco abuse. Lipids today to assess 10 year ASCVD risk.

## 2015-09-04 NOTE — Patient Instructions (Signed)
Thank you for coming in today.   -I am checking some blood work today, I will call you if there are any problems. -Please think about quitting smoking -We will get you set up DEXA bone scan and the mammogram. We will call you with an appointment. -I would like to see you back in 6 months for follow up.

## 2015-09-04 NOTE — Progress Notes (Addendum)
Patient ID: Deborah Arroyo, female   DOB: Mar 16, 1951, 64 y.o.   MRN: [id number]     Subjective:   Patient ID: Deborah Arroyo female   DOB: [birth year] 64 y.o.   MRN: [id number]  HPI: [name] is a 64 y.o. female with a past medical history of cervical cancer s/p total abdominal hysterectomy and oophorectomy here today to establish with our clinic. She reports no problems today. She has not seen many doctors in the past and has never had a PCP. She reports she is only here today because she now has insurance and was told that she would need a PCP.   Reports that she was told that she no longer needs any PAP smears after having her cervix removed in the past. She does have a family history of breast cancer in her daughter (age 43). Has not had a mammogram done in 4-5 years. She is also a current every day smoker. She reports smoking 1-1.5 PPD for the past 43 years. Denies any shortness of breath, wheezing or cough. Reports she has had a DEXA scan in the distant past that was borderline low but has never had any follow up.   There are records in our system but she reports that these are not her. She says that she has had problems with her social security in the past because of someone with the same name living in the area. She believes that these records are for this person with her same name. Does report that the ED visit from 12/2011 was her but the records after that are not for her as she has not seen any medical care since that time. Will need to work to get her records fixed in our system.   Past Medical History  Diagnosis Date  . Cervical cancer ([place])    No current outpatient prescriptions on file.   No current facility-administered medications for this visit.   Past Surgical History  Procedure Laterality Date  . Cholecystectomy    . Shoulder surgery Left     Rotary Cuff Tear  . Abdominal hysterectomy    . Appendectomy    . Tubal ligation     Family History  Problem Relation  Age of Onset  . Cancer Mother     uterine  . Diabetes Sister   . Cancer Sister     ovarian  . Cancer Brother     prostate  . Cancer Other   . Cancer Brother     lung/brain  . Cancer Daughter 73    breast   Social History   Social History  . Marital Status: Divorced    Spouse Name: N/A  . Number of Children: N/A  . Years of Education: N/A   Social History Main Topics  . Smoking status: Current Every Day Smoker -- 1.00 packs/day    Types: Cigarettes  . Smokeless tobacco: None  . Alcohol Use: No  . Drug Use: No  . Sexual Activity: No   Other Topics Concern  . None   Social History Narrative   Review of Systems: Review of Systems  Constitutional: Negative for fever, chills and weight loss.  Respiratory: Negative for cough, sputum production and wheezing.   Cardiovascular: Negative.   Gastrointestinal: Negative.   Genitourinary: Negative.   Musculoskeletal: Negative for falls.  Skin: Negative for rash.  Neurological: Negative.  Negative for headaches.  Psychiatric/Behavioral: Negative for depression.   Objective:  Physical Exam: Filed Vitals:   09/04/15 1019  BP: 136/74  Pulse: 73  Temp: 97.6 F (36.4 C)  TempSrc: Oral  Height: 5\' 1"  (1.549 m)  Weight: 116 lb 14.4 oz (53.025 kg)  SpO2: 97%   PHYSICAL EXAM GENERAL- alert, co-operative, appears as stated age, not in any distress. HEENT- Atraumatic, normocephalic, PERRL, EOMI, oral mucosa appears moist CARDIAC- RRR, no murmurs, rubs or gallops. RESP- Moving equal volumes of air, and clear to auscultation bilaterally, no wheezes or crackles. ABDOMEN- Soft, nontender, bowel sounds present. NEURO- No obvious Cr N abnormality. EXTREMITIES- pulse 2+, symmetric, no pedal edema. SKIN- Warm, dry, No rash or lesion. PSYCH- Normal mood and affect, appropriate thought content and speech.  Assessment & Plan:   Case discussed with Dr. Evette Doffing. Please refer to Problem based carting for further details of today's  visit.

## 2015-09-04 NOTE — Assessment & Plan Note (Signed)
Deborah Arroyo is a current every day smoker. She reports smoking 1-1.5 packs per day. She has been smoking 1+ pack/day for the past 43 years. I counseled her on the dangers of tobacco use and advised her to stop smoking. She reports no interest in quitting at this time. She is currently in the pre-contemplative stage. Was not interested in any help quitting. Will continue to monitor and encourage smoking cessation in the future.

## 2015-09-05 LAB — LIPID PANEL
CHOL/HDL RATIO: 2.9 ratio (ref 0.0–4.4)
Cholesterol, Total: 190 mg/dL (ref 100–199)
HDL: 65 mg/dL (ref >39)
LDL Calculated: 97 mg/dL (ref 0–99)
Triglycerides: 142 mg/dL (ref 0–149)
VLDL CHOLESTEROL CAL: 28 mg/dL (ref 5–40)

## 2015-09-23 ENCOUNTER — Encounter: Payer: Self-pay | Admitting: Student

## 2015-10-30 DIAGNOSIS — H2513 Age-related nuclear cataract, bilateral: Secondary | ICD-10-CM | POA: Diagnosis not present

## 2015-10-30 DIAGNOSIS — H40033 Anatomical narrow angle, bilateral: Secondary | ICD-10-CM | POA: Diagnosis not present

## 2015-11-12 DIAGNOSIS — E2839 Other primary ovarian failure: Secondary | ICD-10-CM | POA: Insufficient documentation

## 2015-11-12 NOTE — Assessment & Plan Note (Signed)
65 year old female will send for DEXA scan for screening purposes

## 2015-11-12 NOTE — Addendum Note (Signed)
Addended by: Darby Lions on: 11/12/2015 01:45 PM   Modules accepted: Orders

## 2015-11-14 ENCOUNTER — Other Ambulatory Visit: Payer: Self-pay | Admitting: Internal Medicine

## 2015-11-14 DIAGNOSIS — Z1231 Encounter for screening mammogram for malignant neoplasm of breast: Secondary | ICD-10-CM

## 2015-11-18 DIAGNOSIS — Z780 Asymptomatic menopausal state: Secondary | ICD-10-CM | POA: Diagnosis not present

## 2015-11-18 DIAGNOSIS — Z1231 Encounter for screening mammogram for malignant neoplasm of breast: Secondary | ICD-10-CM | POA: Diagnosis not present

## 2015-11-18 DIAGNOSIS — M8588 Other specified disorders of bone density and structure, other site: Secondary | ICD-10-CM | POA: Diagnosis not present

## 2015-12-11 ENCOUNTER — Other Ambulatory Visit: Payer: Medicare Other

## 2015-12-11 ENCOUNTER — Ambulatory Visit: Payer: Medicare Other

## 2016-03-23 ENCOUNTER — Ambulatory Visit: Payer: Medicare Other | Admitting: Internal Medicine

## 2016-03-24 ENCOUNTER — Encounter: Payer: Self-pay | Admitting: Internal Medicine

## 2016-03-24 ENCOUNTER — Ambulatory Visit (INDEPENDENT_AMBULATORY_CARE_PROVIDER_SITE_OTHER): Payer: Medicare Other | Admitting: Internal Medicine

## 2016-03-24 VITALS — BP 122/75 | HR 74 | Temp 97.6°F | Ht 61.0 in | Wt 122.8 lb

## 2016-03-24 DIAGNOSIS — L299 Pruritus, unspecified: Secondary | ICD-10-CM | POA: Diagnosis not present

## 2016-03-24 MED ORDER — CETIRIZINE HCL 10 MG PO TABS: 10.0000 mg | ORAL_TABLET | Freq: Every day | ORAL | Status: DC

## 2016-03-24 NOTE — Patient Instructions (Signed)
1. Please make a follow up appointment for 6 months.   If your itching does not go away, please call and make an appointment so we can consider biopsy or other treatment options.   2. Please take all medications as previously prescribed with the following changes:  Start taking Zyrtec 10 mg daily.   Please use Eucerin hypoallergenic lotion on your arms daily for dry skin.   You can also try Calamine lotion as well, this frequently helps with itching.   Please use sun screen when in the sun to prevent sun burn.   If you continue to have itching, you can try hydrocortisone cream over the counter. You can pick this up at the drug store.   3. If you have worsening of your symptoms or new symptoms arise, please call the clinic [phone]), or go to the ER immediately if symptoms are severe.  You have done a great job in taking all your medications. Please continue to do this.

## 2016-03-24 NOTE — Progress Notes (Signed)
   Subjective:   Patient ID: Deborah Arroyo female   DOB: Nov 20, 1950 65 y.o.   MRN: [id number]  HPI: [name] is a 65 y.o. female w/ PMHx of cervical CA s/p TAH/SO and tobacco abuse, presents to the clinic today for an acute visit for itching of her arms bilaterally. Says this has been bothering her for the last couple days and is driving her crazy. She has tried anti-itch creams and nothing she has tried has helped. She denies having a rash on her arms. She also denies any recent outdoor exposures, poison ivy, change in lotions, soaps, detergents, clothes, or medications. She does state that she has had some mild seasonal allergies lately, something she usually does not suffer from. She also states that she had a sunburn relatively recently and states that she noticed some peeling on this area as well. She does have a dog that sometimes has been noted to have fleas, but she has not noticed any bite marks. No one else in the family has had this issue. No other symptoms. No bruising, fevers, or joint pains.   Past Medical History  Diagnosis Date  . Cervical cancer [facility])    Current Outpatient Prescriptions  Medication Sig Dispense Refill  . cetirizine (ZYRTEC ALLERGY) 10 MG tablet Take 1 tablet (10 mg total) by mouth daily. 30 tablet 1   No current facility-administered medications for this visit.    Review of Systems: General: Denies fever, chills, diaphoresis, appetite change and fatigue.  Respiratory: Denies SOB, DOE, cough, and wheezing.   Cardiovascular: Denies chest pain and palpitations.  Gastrointestinal: Denies nausea, vomiting, abdominal pain, and diarrhea.  Genitourinary: Denies dysuria, increased frequency, and flank pain. Endocrine: Denies hot or cold intolerance, polyuria, and polydipsia. Musculoskeletal: Denies myalgias, back pain, joint swelling, arthralgias and gait problem.  Skin: Positive for pruritis on arms. Denies pallor, rash and wounds.  Neurological: Denies  dizziness, seizures, syncope, weakness, lightheadedness, numbness and headaches.  Psychiatric/Behavioral: Denies mood changes, and sleep disturbances.  Objective:   Physical Exam: Filed Vitals:   03/24/16 1353  BP: 122/75  Pulse: 74  Temp: 97.6 F (36.4 C)  TempSrc: Oral  Height: 5\' 1"  (1.549 m)  Weight: 122 lb 12.8 oz (55.702 kg)  SpO2: 97%    General: White female, alert, cooperative, NAD. HEENT: PERRL, EOMI. Moist mucus membranes Neck: Full range of motion without pain, supple, no lymphadenopathy or carotid bruits Lungs: Clear to ascultation bilaterally, normal work of respiration, no wheezes, rales, rhonchi Heart: RRR, no murmurs, gallops, or rubs Abdomen: Soft, non-tender, non-distended, BS + Extremities: No cyanosis, clubbing, or edema. Minimal excoriations over the upper arms, no obvious rash, urticaria, or petechiae.  Neurologic: Alert & oriented X3, cranial nerves II-XII intact, strength grossly intact, sensation intact to light touch   Assessment & Plan:   Please see problem based assessment and plan.

## 2016-03-25 NOTE — Assessment & Plan Note (Signed)
Present over her upper arms bilaterally. No obvious recent exposures, bites, lotions, detergents, etc. Did have a recent sunburn and has had some mild seasonal allergies recently. No obvious rash, erythema, urticaria, or petechiae. No clear signs of jaundice or icterus. Suspect this is related to recent sunburn and dry skin. -Advised eucerin hypoallergenic lotion -Start Zyrtec 10 mg daily -Also advised trial of Calamine -If these things are ineffective, will try Benadryl prn, at least for bed time -Last resort, OTC steroid cream -If no improvement or resolution, patient will return, can consider checking basic labs (LFT's) and skin biopsy

## 2016-03-26 NOTE — Progress Notes (Signed)
Internal Medicine Clinic Attending  Case discussed with Dr. Jones at the time of the visit.  We reviewed the resident's history and exam and pertinent patient test results.  I agree with the assessment, diagnosis, and plan of care documented in the resident's note.  

## 2016-10-13 ENCOUNTER — Encounter: Payer: Medicare Other | Admitting: Internal Medicine

## 2016-11-17 ENCOUNTER — Encounter: Payer: Medicare Other | Admitting: Internal Medicine

## 2017-08-24 ENCOUNTER — Ambulatory Visit (INDEPENDENT_AMBULATORY_CARE_PROVIDER_SITE_OTHER): Payer: Medicare Other | Admitting: Internal Medicine

## 2017-08-24 ENCOUNTER — Encounter: Payer: Self-pay | Admitting: Internal Medicine

## 2017-08-24 VITALS — BP 130/75 | HR 71 | Temp 97.7°F | Ht 61.0 in | Wt 123.5 lb

## 2017-08-24 DIAGNOSIS — M25562 Pain in left knee: Secondary | ICD-10-CM

## 2017-08-24 DIAGNOSIS — Z0000 Encounter for general adult medical examination without abnormal findings: Secondary | ICD-10-CM

## 2017-08-24 DIAGNOSIS — M25561 Pain in right knee: Secondary | ICD-10-CM

## 2017-08-24 DIAGNOSIS — K921 Melena: Secondary | ICD-10-CM | POA: Diagnosis not present

## 2017-08-24 DIAGNOSIS — Z7982 Long term (current) use of aspirin: Secondary | ICD-10-CM

## 2017-08-24 DIAGNOSIS — Z800 Family history of malignant neoplasm of digestive organs: Secondary | ICD-10-CM

## 2017-08-24 DIAGNOSIS — Z720 Tobacco use: Secondary | ICD-10-CM

## 2017-08-24 DIAGNOSIS — F17210 Nicotine dependence, cigarettes, uncomplicated: Secondary | ICD-10-CM | POA: Diagnosis not present

## 2017-08-24 DIAGNOSIS — M79662 Pain in left lower leg: Secondary | ICD-10-CM

## 2017-08-24 DIAGNOSIS — M79661 Pain in right lower leg: Secondary | ICD-10-CM

## 2017-08-24 NOTE — Assessment & Plan Note (Addendum)
ASCVD score 11.4%. Patient not interested in medications currently. Will continue to address at future visits.

## 2017-08-24 NOTE — Progress Notes (Signed)
   CC: Bloody stools  HPI:  [name] is a 66 y.o. female with a past medical history listed below here today with complaints of bloody stools. For details of today's visit and the status of her chronic medical issues please refer to the assessment and plan.   Past Medical History:  Diagnosis Date  . Cervical cancer ([place])    Review of Systems:   No chest pain or shortness of breath  Physical Exam:  Vitals:   08/24/17 1416  BP: 130/75  Pulse: 71  Temp: 97.7 F (36.5 C)  TempSrc: Oral  SpO2: 98%  Weight: 123 lb 8 oz (56 kg)  Height: 5\' 1"  (1.549 m)   GENERAL- alert, co-operative, appears as stated age, not in any distress. CARDIAC- RRR, no murmurs, rubs or gallops. RESP- Moving equal volumes of air, and clear to auscultation bilaterally ABDOMEN- Soft, nontender, bowel sounds present. NEURO- No obvious Cr N abnormality. EXTREMITIES- Right PT and Left DP obtained via doppler, only faintly palpable.  SKIN- Warm, dry, No rash or lesion. PSYCH- Normal mood and affect, appropriate thought content and speech.   Assessment & Plan:   See Encounters Tab for problem based charting.  Patient discussed with Dr. Dareen Piano

## 2017-08-24 NOTE — Patient Instructions (Signed)
Deborah Arroyo,  Please follow up with the stomach doctors for a colonoscopy. Come back to see me in 1 month.

## 2017-08-25 ENCOUNTER — Encounter: Payer: Self-pay | Admitting: Gastroenterology

## 2017-08-25 DIAGNOSIS — M25561 Pain in right knee: Secondary | ICD-10-CM | POA: Insufficient documentation

## 2017-08-25 DIAGNOSIS — K6289 Other specified diseases of anus and rectum: Secondary | ICD-10-CM | POA: Insufficient documentation

## 2017-08-25 DIAGNOSIS — M79662 Pain in left lower leg: Secondary | ICD-10-CM

## 2017-08-25 DIAGNOSIS — M79661 Pain in right lower leg: Secondary | ICD-10-CM | POA: Insufficient documentation

## 2017-08-25 DIAGNOSIS — M25562 Pain in left knee: Secondary | ICD-10-CM

## 2017-08-25 LAB — CMP14 + ANION GAP
ALBUMIN: 4.4 g/dL (ref 3.6–4.8)
ALK PHOS: 119 IU/L — AB (ref 39–117)
ALT: 11 IU/L (ref 0–32)
AST: 15 IU/L (ref 0–40)
Albumin/Globulin Ratio: 1.8 (ref 1.2–2.2)
Anion Gap: 16 mmol/L (ref 10.0–18.0)
BUN / CREAT RATIO: 12 (ref 12–28)
BUN: 9 mg/dL (ref 8–27)
CHLORIDE: 94 mmol/L — AB (ref 96–106)
CO2: 25 mmol/L (ref 20–29)
Calcium: 9.4 mg/dL (ref 8.7–10.3)
Creatinine, Ser: 0.76 mg/dL (ref 0.57–1.00)
GFR calc Af Amer: 94 mL/min/{1.73_m2} (ref >59)
GFR calc non Af Amer: 81 mL/min/{1.73_m2} (ref >59)
Globulin, Total: 2.5 g/dL (ref 1.5–4.5)
Glucose: 83 mg/dL (ref 65–99)
Potassium: 4.3 mmol/L (ref 3.5–5.2)
SODIUM: 135 mmol/L (ref 134–144)
Total Protein: 6.9 g/dL (ref 6.0–8.5)

## 2017-08-25 LAB — CBC
HEMATOCRIT: 41.7 % (ref 34.0–46.6)
Hemoglobin: 14.3 g/dL (ref 11.1–15.9)
MCH: 31.6 pg (ref 26.6–33.0)
MCHC: 34.3 g/dL (ref 31.5–35.7)
MCV: 92 fL (ref 79–97)
PLATELETS: 365 10*3/uL (ref 150–379)
RBC: 4.53 x10E6/uL (ref 3.77–5.28)
RDW: 13.6 % (ref 12.3–15.4)
WBC: 8.7 10*3/uL (ref 3.4–10.8)

## 2017-08-25 NOTE — Assessment & Plan Note (Addendum)
1 to 1.5 PPD. 42-43 years smoking. No interest in quitting.

## 2017-08-25 NOTE — Progress Notes (Signed)
Internal Medicine Clinic Attending  Case discussed with Dr. Boswell at the time of the visit.  We reviewed the resident's history and exam and pertinent patient test results.  I agree with the assessment, diagnosis, and plan of care documented in the resident's note.  

## 2017-08-25 NOTE — Assessment & Plan Note (Addendum)
Reports having two large episodes of bloody stools in August and September. Has had smaller volume stools with streaky blood on her stool both before and after those events. Notices it in the toilet bowel water and on the stool when it occurs. A few episodes of melena as well over the past several months. No pain. No chest pain. No pallor. Some shortness of breath with exertion. No abdominal pain. Sister with history of stomach cancer. No family history of colon cancer. Reports remote history of colonoscopy in the distant past, ~15-20 years ago. Reports never hearing back about any results. Tylenol use and Aspirin use for headaches/knee pain. Reports ASA 3 325 mg pills 2-4 times daily.   A/P Hematochezia x 2-3 months.  CBC normal with Hgb 14.3. Patient asymptomatic. Will arrange for close follow up with GI for colonoscopy. Stressed the importance of following up with GI. Avoid NSAIDs. Follow up with me in clinic in 1 month.

## 2017-08-25 NOTE — Assessment & Plan Note (Signed)
Reports knee pain worse with walking up steps and long walks. No help with ASA or Tylenol. Pain present for several years. No injuries.  A/P Most likely OA. Conservative therapies.

## 2017-08-25 NOTE — Assessment & Plan Note (Addendum)
Reports bilateral calf pain with walking. States she can walk 1-2 minutes before the pain starts. Pain resolves with stopping. Reports this pain feels like squeezing pain.   Skin warm. No ulcerations. Pulses difficult to palpate bilaterally. Able to obtain DP on left and PT on right with doppler. ABI obtained in clinic and were >1.2 bilaterally.  A/P Claudication symptoms concerning for PAD given her significant smoking history. May have some arterial calcifications given both approaching 1.3. Will hold off on any interventions currently given her hematochezia. Follow up in 1 month.

## 2017-09-06 ENCOUNTER — Ambulatory Visit (AMBULATORY_SURGERY_CENTER): Payer: Self-pay

## 2017-09-06 VITALS — Ht 61.0 in | Wt 125.6 lb

## 2017-09-06 DIAGNOSIS — K921 Melena: Secondary | ICD-10-CM

## 2017-09-06 MED ORDER — NA SULFATE-K SULFATE-MG SULF 17.5-3.13-1.6 GM/177ML PO SOLN: 1.0000 | Freq: Once | ORAL | 0 refills | Status: AC

## 2017-09-06 NOTE — Progress Notes (Signed)
Denies allergies to eggs or soy products. Denies complication of anesthesia or sedation. Denies use of weight loss medication. Denies use of O2.   Emmi instructions declined.  

## 2017-09-08 ENCOUNTER — Encounter: Payer: Self-pay | Admitting: Internal Medicine

## 2017-09-08 ENCOUNTER — Other Ambulatory Visit: Payer: Self-pay

## 2017-09-08 ENCOUNTER — Ambulatory Visit (INDEPENDENT_AMBULATORY_CARE_PROVIDER_SITE_OTHER): Payer: Medicare Other | Admitting: Internal Medicine

## 2017-09-08 DIAGNOSIS — R51 Headache: Secondary | ICD-10-CM

## 2017-09-08 DIAGNOSIS — G8929 Other chronic pain: Secondary | ICD-10-CM

## 2017-09-08 DIAGNOSIS — G4486 Cervicogenic headache: Secondary | ICD-10-CM | POA: Insufficient documentation

## 2017-09-08 MED ORDER — GABAPENTIN 100 MG PO CAPS: 100.0000 mg | ORAL_CAPSULE | Freq: Every evening | ORAL | 0 refills | Status: DC | PRN

## 2017-09-08 NOTE — Patient Instructions (Signed)
Ms. Armenteros,  Please stop taking the acetaminophen for now. I am sending ou in a prescription for gabapentin. Take one at night before bed. You can take one if needed during the day for headache as well.  Please follow up in 2 weeks for recheck.

## 2017-09-08 NOTE — Assessment & Plan Note (Addendum)
Patient with chronic headache x 5 years. Right sided with radiation to neck. No obvious simple tension or cluster headache type symptoms. No migraine headache symptoms. Exam reassuring without any focal findings. Headaches have been stable. Does report occasionally waking her up at night but unchanged for years.   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   D/c acetaminophen today. Will start gabapentin trial. Neurology referral.   RTC in 2 weeks for re-check. If symptoms are worsening would consider MRI brain.

## 2017-09-08 NOTE — Progress Notes (Signed)
   CC: Headaches  HPI:  [name] is a 66 y.o. female with a past medical history listed below here today with complaints of headaches.  Reports daily  (mutliple times a day) headaches x 5 years. Reports right sided pain radiating down back of her neck. Describes the pain as a squeezing pain. Has been taking acetaminophen with some improvement. Headaches occurring 6-8 times daily. Reports can last 1/2 an hour or several hours. Does occassionally wake her up at night. Takes acetaminophen 975 mg 4 to 5 times daily. Reports this usage for numerous years. Has not changed recently; not worsening. Reports she is simply tired of dealing with it. Denies any lacrimation. No nausea, vomiting,or phonophobia. Some photophobia. Denies any blurry vision. Reports history of migraines in the past but stays he has not had any migraines for years and this feels different than a migraine. No jaw pain. No falls. Does note some shoulder weakness but no LE weakness.   Past Medical History:  Diagnosis Date  . Cataract   . Cervical cancer ([place])   . Depression   . Osteopenia    Review of Systems:   Negative except as noted in HPI  Physical Exam:  Vitals:   09/08/17 1352  BP: 129/85  Pulse: 87  Temp: 97.7 F (36.5 C)  TempSrc: Oral  SpO2: 100%  Weight: 121 lb (54.9 kg)  Height: 5\' 1"  (1.549 m)   Physical Exam  Constitutional: She is oriented to person, place, and time and well-developed, well-nourished, and in no distress. No distress.  HENT:  Head: Normocephalic and atraumatic.  Eyes: Conjunctivae and EOM are normal. Pupils are equal, round, and reactive to light.  Neck: Normal range of motion.  Cardiovascular: Normal rate and regular rhythm.  Pulmonary/Chest: Effort normal and breath sounds normal.  Musculoskeletal: Normal range of motion.  Neurological: She is alert and oriented to person, place, and time. She has normal sensation, normal strength, normal reflexes and intact cranial nerves.   Skin: Skin is warm and dry.  Psychiatric: Mood and affect normal.    Assessment & Plan:   See Encounters Tab for problem based charting.  Patient seen with Dr. Evette Doffing

## 2017-09-12 NOTE — Progress Notes (Signed)
Internal Medicine Clinic Attending  I saw and evaluated the patient.  I personally confirmed the key portions of the history and exam documented by Dr. Charlynn Grimes and I reviewed pertinent patient test results.  The assessment, diagnosis, and plan were formulated together and I agree with the documentation in the resident's note.  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 Seems like a hemicrania syndrome. She is certainly over-using acetaminophen which may be contributing, and we talked about strategies to use less of this. Can try gabapentin for symptomatic relief. I think she is going to need specialized headache referral with frequent follow ups to get this under control. I also think there is a component of depression/stress contributing here. The highest risk feature is the night time headaches, no other symptoms of elevated ICP, but if these nocturnal symptoms persist we should do an MRI to rule out CNS lesion.

## 2017-09-15 ENCOUNTER — Encounter: Payer: Self-pay | Admitting: *Deleted

## 2017-09-16 ENCOUNTER — Encounter: Payer: Self-pay | Admitting: Neurology

## 2017-09-20 NOTE — Addendum Note (Signed)
Addended by: Hulan Fray on: 09/20/2017 04:49 PM   Modules accepted: Orders

## 2017-09-21 ENCOUNTER — Encounter: Payer: Medicare Other | Admitting: Gastroenterology

## 2017-09-28 ENCOUNTER — Encounter: Payer: Medicare Other | Admitting: Internal Medicine

## 2017-10-07 ENCOUNTER — Ambulatory Visit (INDEPENDENT_AMBULATORY_CARE_PROVIDER_SITE_OTHER): Payer: Medicare Other | Admitting: Neurology

## 2017-10-07 ENCOUNTER — Encounter: Payer: Self-pay | Admitting: Neurology

## 2017-10-07 VITALS — BP 108/80 | HR 82 | Ht 61.0 in | Wt 121.2 lb

## 2017-10-07 DIAGNOSIS — R51 Headache: Secondary | ICD-10-CM

## 2017-10-07 DIAGNOSIS — G4486 Cervicogenic headache: Secondary | ICD-10-CM

## 2017-10-07 DIAGNOSIS — Z720 Tobacco use: Secondary | ICD-10-CM | POA: Diagnosis not present

## 2017-10-07 DIAGNOSIS — I671 Cerebral aneurysm, nonruptured: Secondary | ICD-10-CM | POA: Diagnosis not present

## 2017-10-07 MED ORDER — GABAPENTIN 300 MG PO CAPS
300.0000 mg | ORAL_CAPSULE | Freq: Every day | ORAL | 2 refills | Status: DC
Start: 2017-10-07 — End: 2018-03-06

## 2017-10-07 NOTE — Patient Instructions (Signed)
1.  I will increase the gabapentin to 300mg  at bedtime (pick up new prescription).  If not helpful in 4 weeks, contact me prior to refill and we can increase dose. 2.  Take over the counter turmeric 500mg  once to twice daily 3.  Take acetaminophen or ibuprofen as needed for acute headache, but limit to no more than 2 days out of the week. 4.  We will refer you to physical therapy for neck pain 5.  We will check MRI and MRA of head to follow up on aneurysm. 6.  Follow up in 3 months.

## 2017-10-07 NOTE — Progress Notes (Signed)
NEUROLOGY CONSULTATION NOTE  Deborah Arroyo MRN: [id number] DOB: [birth year]  Referring provider: Dr. Charlynn Grimes Primary care provider: Dr. Charlynn Grimes  Reason for consult:  headache  HISTORY OF PRESENT ILLNESS: Deborah Arroyo is a 66 year old female who presents for headache.  She is accompanied by her sister who supplements history.  Onset:  She has remote history of migraines.  She started getting new headaches in 2011 to 2013.  At that time, she was having dizzy spells where she would sometimes pass out.  MRI of brain and MRA of head and neck from 01/09/12 to evaluate headache and dizziness were personally reviewed and revealed 2 mm aneurysm versus infundibulum at the right MCA bifurcation but otherwise unremarkable.  Headaches became less frequent but then have increased in frequency over the past 6 months. Location:  Right occipital, radiating into back of the neck on the right. Quality:  Pressure, throbbing Intensity:  dull Aura:  no Prodrome:  no Postdrome:  no Associated symptoms:  No nausea, photophobia, phonophobia or visual disturbance.  She has not had any new worse headache of her life, waking up from sleep Duration:  A couple of hours with medication, otherwise several hours Frequency:  4 to 5 days a week Frequency of abortive medication: almost daily Triggers/exacerbating factors:  no Relieving factors:  rest Activity:  Does not aggravate  Past NSAIDS:  Aleve (eye and throat swelling), however she can take ibuprofen Past analgesics:  no Past abortive triptans:  no Past muscle relaxants:  no Past antihypertensive medications:  no Past antidepressant medications:  no Past anticonvulsant medications:  no Other past therapies:  no  Current NSAIDS:  no Current analgesics:  Tylenol Current triptans:  no Current anti-emetic:  no Current muscle relaxants:  no Current anti-anxiolytic:  no Current sleep aide:  no Current Antihypertensive medications:  no Current  Antidepressant medications:  no Current Anticonvulsant medications:  gabapentin 100mg  Current Vitamins/Herbal/Supplements:  no Current Antihistamines/Decongestants:  no Other therapy:  no  Caffeine:  coffee Alcohol:  no Smoker:  yes Diet:  Does not hydrate Exercise:  No  Depression/anxiety:  anxiety Sleep hygiene:  poor Family history of headache:  no  08/24/17 LABS:  Na 135, K 4.3, Cl 94, CO2 25, glucose 83, BUN 9, Cr 0.76  PAST MEDICAL HISTORY: Past Medical History:  Diagnosis Date  . Cataract   . Cervical cancer ([place])   . Depression   . Osteopenia     PAST SURGICAL HISTORY: Past Surgical History:  Procedure Laterality Date  . ABDOMINAL HYSTERECTOMY    . APPENDECTOMY    . CHOLECYSTECTOMY    . EYE SURGERY    . SHOULDER SURGERY Left    Rotary Cuff Tear  . TUBAL LIGATION      MEDICATIONS: Current Outpatient Medications on File Prior to Visit  Medication Sig Dispense Refill  . acetaminophen (TYLENOL) 325 MG tablet Take 650 mg every 6 (six) hours as needed by mouth.    . cetirizine (ZYRTEC ALLERGY) 10 MG tablet Take 1 tablet (10 mg total) by mouth daily. (Patient not taking: Reported on 09/06/2017) 30 tablet 1   No current facility-administered medications on file prior to visit.     ALLERGIES: Allergies  Allergen Reactions  . Naproxen Sodium [Naproxen Sodium] Other (See Comments)    Eyes and throat swell up    FAMILY HISTORY: Family History  Problem Relation Age of Onset  . Diabetes Sister   . Cancer Sister  ovarian  . Stomach cancer Sister   . Cancer Mother        uterine  . Dementia Mother   . Cancer Brother        prostate  . Cancer Other   . Cancer Brother        lung/brain  . Cancer Daughter 65       breast  . Alzheimer's disease Father   . Colon cancer Neg Hx   . Esophageal cancer Neg Hx   . Pancreatic cancer Neg Hx   . Rectal cancer Neg Hx     SOCIAL HISTORY: Social History   Socioeconomic History  . Marital status: Divorced      Spouse name: Not on file  . Number of children: 3  . Years of education: Not on file  . Highest education level: 8th grade  Social Needs  . Financial resource strain: Not on file  . Food insecurity - worry: Not on file  . Food insecurity - inability: Not on file  . Transportation needs - medical: Not on file  . Transportation needs - non-medical: Not on file  Occupational History  . Occupation: retired  Tobacco Use  . Smoking status: Current Every Day Smoker    Packs/day: 1.50    Types: Cigarettes  . Smokeless tobacco: Never Used  Substance and Sexual Activity  . Alcohol use: No    Alcohol/week: 0.0 oz  . Drug use: No  . Sexual activity: No  Other Topics Concern  . Not on file  Social History Narrative   Divorced, lives with 2 sons and 1 grandchild, I dog. She drinks 3-4 cups of coffee a day. Is very active around the house. Has 3 brothers and 6 sisters.    REVIEW OF SYSTEMS: Constitutional: No fevers, chills, or sweats, no generalized fatigue, change in appetite Eyes: No visual changes, double vision, eye pain Ear, nose and throat: No hearing loss, ear pain, nasal congestion, sore throat Cardiovascular: No chest pain, palpitations Respiratory:  No shortness of breath at rest or with exertion, wheezes GastrointestinaI: No nausea, vomiting, diarrhea, abdominal pain, fecal incontinence Genitourinary:  No dysuria, urinary retention or frequency Musculoskeletal:  Neck pain Integumentary: No rash, pruritus, skin lesions Neurological: as above Psychiatric: No depression, insomnia, anxiety Endocrine: No palpitations, fatigue, diaphoresis, mood swings, change in appetite, change in weight, increased thirst Hematologic/Lymphatic:  No purpura, petechiae. Allergic/Immunologic: no itchy/runny eyes, nasal congestion, recent allergic reactions, rashes  PHYSICAL EXAM: Vitals:   10/07/17 0742  BP: 108/80  Pulse: 82  SpO2: 94%   General: No acute distress.   Head:   Normocephalic/atraumatic Eyes:  fundi examined but not visualized Neck: supple, right sided tenderness, full range of motion Back: No paraspinal tenderness Heart: regular rate and rhythm Lungs: Clear to auscultation bilaterally. Vascular: No carotid bruits. Neurological Exam: Mental status: alert and oriented to person, place, and time, recent and remote memory intact, fund of knowledge intact, attention and concentration intact, speech fluent and not dysarthric, language intact. Cranial nerves: CN I: not tested CN II: pupils equal, round and reactive to light, visual fields intact CN III, IV, VI:  full range of motion, no nystagmus, no ptosis CN V: facial sensation intact CN VII: upper and lower face symmetric CN VIII: hearing intact CN IX, X: gag intact, uvula midline CN XI: sternocleidomastoid and trapezius muscles intact CN XII: tongue midline Bulk & Tone: normal, no fasciculations. Motor:  5/5 throughout  Sensation: temperature sensation reduced in toes and vibration sensation intact.  Deep Tendon Reflexes:  2+ throughout, toes downgoing.  Finger to nose testing:  Without dysmetria.  Heel to shin:  Without dysmetria.  Gait:  Normal station and stride.  Able to turn, difficulty with tandem. Romberg negative.  IMPRESSION: Cervicogenic headache Cerebral aneurysm Cigarette smoker  PLAN: 1.  Increase gabapentin to 300mg  at bedtime and may increase dose in 4 weeks if needed. 2.  Refer to physical therapy for neck pain (headache trigger) 3.  Acetaminophen or ibuprofen as needed but limited to no more than 2 days out of week to prevent rebound headache. 4.  Advised to take turmeric 500mg  once to twice daily 5.  Smoking cessation instruction/counseling given:  counseled patient on the dangers of tobacco use, advised patient to stop smoking, and reviewed strategies to maximize success. 6.  To follow up on aneurysm, will check MRI/MRA of head 7.  Follow up in 3 months.  Thank you  for allowing me to take part in the care of this patient.  Metta Clines, DO  CC:  Maryellen Pile, MD

## 2017-10-12 ENCOUNTER — Encounter: Payer: Medicare Other | Admitting: Internal Medicine

## 2017-10-12 ENCOUNTER — Telehealth: Payer: Self-pay | Admitting: *Deleted
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
## 2017-10-26 ENCOUNTER — Ambulatory Visit
Admission: RE | Admit: 2017-10-26 | Discharge: 2017-10-26 | Disposition: A | Discharge status: Home / Self Care | Payer: Medicare Other | Source: Ambulatory Visit | Attending: Neurology | Admitting: Neurology

## 2017-10-26 DIAGNOSIS — I671 Cerebral aneurysm, nonruptured: Secondary | ICD-10-CM

## 2017-10-26 DIAGNOSIS — R51 Headache: Secondary | ICD-10-CM | POA: Diagnosis not present

## 2017-10-27 ENCOUNTER — Encounter: Payer: Self-pay | Admitting: Physical Therapy

## 2017-10-27 ENCOUNTER — Ambulatory Visit: Payer: Medicare Other | Attending: Internal Medicine | Admitting: Physical Therapy

## 2017-10-27 DIAGNOSIS — R293 Abnormal posture: Secondary | ICD-10-CM

## 2017-10-27 DIAGNOSIS — M6281 Muscle weakness (generalized): Secondary | ICD-10-CM | POA: Insufficient documentation

## 2017-10-27 DIAGNOSIS — M542 Cervicalgia: Secondary | ICD-10-CM | POA: Insufficient documentation

## 2017-10-27 NOTE — Patient Instructions (Addendum)
Levator Stretch Armpit stretch   Grasp seat or sit on hand on side to be stretched. Turn head toward other side and look down. Use hand on head to gently stretch neck in that position. Hold _30___ seconds. Repeat on other side. Repeat __3__ times. Do __3__ sessions per day.  http://gt2.exer.us/30   Copyright  VHI. All rights reserved.  Side-Bending   One hand on opposite side of head, pull head to side as far as is comfortable. Stop if there is pain. Hold __30__ seconds. Repeat with other hand to other side. Repeat _3___ times. Do __3__ sessions per day.   Copyright  VHI. All rights reserved.  Scapular Retraction (Standing)   With arms at sides, pinch shoulder blades together. Repeat _10___ times per set. Do __1__ sets per session. Do _3___ sessions per day.  http://orth.exer.us/944   Copyright  VHI. All rights reserved.  Chin Protraction / Retraction   Slide head forward keeping chin level. Slide head back, pulling chin in. Hold each position __10_ seconds. Repeat _5__ times. Do _5__ sessions per day.  Copyright  VHI. All rights reserved.   Posture Tips DO: - stand tall and erect - keep chin tucked in - keep head and shoulders in alignment - check posture regularly in mirror or large window - pull head back against headrest in car seat;  Change your position often.  Sit with lumbar support. DON'T: - slouch or slump while watching TV or reading - sit, stand or lie in one position  for too long;  Sitting is especially hard on the spine so if you sit at a desk/use the computer, then stand up often!   Copyright  VHI. All rights reserved.  Posture - Standing   Good posture is important. Avoid slouching and forward head thrust. Maintain curve in low back and align ears over shoul- ders, hips over ankles.  Pull your belly button in toward your back bone. Even weight in heels and toes, soft knees, Ribs lifted up. Golden thread to [employer] down  [employer]. All rights  reserved.  Posture - Sitting    you need a stool under feet so your legs dont dangle.  Sit upright, head facing forward. Try using a roll to support lower back. Keep shoulders relaxed, and avoid rounded back. Keep hips level with knees. Avoid crossing legs for long periods. Sit on Sit bones not tail bone.    Copyright  VHI. All rights reserved.   Voncille Lo, PT Certified Exercise Expert for the Aging Adult  10/27/17 12:28 PM Phone: [phone] Fax: [phone]
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
## 2017-10-31 ENCOUNTER — Telehealth: Payer: Self-pay

## 2017-10-31 NOTE — Telephone Encounter (Signed)
-----   Message from Pieter Partridge, DO sent at 10/27/2017  7:10 AM EST ----- MRI of brain looks okay.  It does not reveal any aneurysm.

## 2017-10-31 NOTE — Telephone Encounter (Signed)
Called and spoke with Pt, advsd her of MRI results. 

## 2017-11-07 ENCOUNTER — Encounter: Payer: Self-pay | Admitting: Physical Therapy

## 2017-11-07 ENCOUNTER — Ambulatory Visit: Payer: Medicare Other | Attending: Internal Medicine | Admitting: Physical Therapy

## 2017-11-07 DIAGNOSIS — M6281 Muscle weakness (generalized): Secondary | ICD-10-CM | POA: Insufficient documentation

## 2017-11-07 DIAGNOSIS — M542 Cervicalgia: Secondary | ICD-10-CM | POA: Insufficient documentation

## 2017-11-07 DIAGNOSIS — R293 Abnormal posture: Secondary | ICD-10-CM

## 2017-11-07 NOTE — Therapy (Signed)
[place], [place], [address] Phone: [phone]   Fax:  [phone]  Physical Therapy Treatment  Patient Details  Name: Deborah Arroyo MRN: [id number] Date of Birth: 10-17-1951 Referring Provider: Metta Clines MD   Encounter Date: 11/07/2017  PT End of Session - 11/07/17 1018    Visit Number  2    Number of Visits  12    Date for PT Re-Evaluation  12/08/17    Authorization Type  UHC    PT Start Time  1017    PT Stop Time  1107    PT Time Calculation (min)  50 min    Activity Tolerance  Patient tolerated treatment well    Behavior During Therapy  [facility] for tasks assessed/performed       Past Medical History:  Diagnosis Date  . Cataract   . Cervical cancer ([place])   . Depression   . Osteopenia     Past Surgical History:  Procedure Laterality Date  . ABDOMINAL HYSTERECTOMY    . APPENDECTOMY    . CHOLECYSTECTOMY    . EYE SURGERY    . SHOULDER SURGERY Left    Rotary Cuff Tear  . TUBAL LIGATION      There were no vitals filed for this visit.  Subjective Assessment - 11/07/17 1019    Subjective  My neck still aches. Have been trying to sit with better posture but cannot hold it for too long. I don't do the stretches as much as she wanted me to. I cannot hear in my Left ear, I always have to turn my head to hear.     Patient Stated Goals  I want to be able to have no headaches and neck pain and sleep better at night. Takes 2 -3 hours to go to sleep.    Currently in Pain?  Yes    Pain Score  5     Pain Location  Neck    Pain Orientation  Right    Pain Descriptors / Indicators  -- sore    Aggravating Factors   chores    Pain Relieving Factors  rest                      [facility] Adult PT Treatment/Exercise - 11/07/17 0001      Exercises   Exercises  Neck      Neck Exercises: Seated   Other Seated Exercise  row green tband      Neck Exercises: Supine   Neck Retraction  10 reps    Neck Retraction Limitations  supine chin tucks      Modalities   Modalities  Moist Heat      Moist Heat Therapy   Number Minutes Moist Heat  10 Minutes    Moist Heat Location  Cervical      Manual Therapy   Manual Therapy  Soft tissue mobilization;Manual Traction    Manual therapy comments  skilled palpation & monitoring during TPDN    Soft tissue mobilization  Rt upper trap, suboccipitals, superior SCMs    Manual Traction  manual cervical traction      Neck Exercises: Stretches   Upper Trapezius Stretch  2 reps;20 seconds    Upper Trapezius Stretch Limitations  with scapular retraction    Other Neck Stretches  supine pec stretch    Other Neck Stretches  rotation SNAG with towel       Trigger Point Dry Needling -  11/07/17 1038    Consent Given?  Yes    Education Handout Provided  -- verbal education    Muscles Treated Upper Body  Upper trapezius Rt    Upper Trapezius Response  Twitch reponse elicited;Palpable increased muscle length           PT Education - 11/07/17 1100    Education provided  Yes    Education Details  TPDN & expected outcomes, posure, regular stretching, drink water, exercise form/rationale    Person(s) Educated  Patient    Methods  Explanation;Demonstration;Tactile cues;Verbal cues;Handout    Comprehension  Verbalized understanding;Need further instruction;Returned demonstration;Verbal cues required;Tactile cues required       PT Short Term Goals - 10/27/17 1230      PT SHORT TERM GOAL #1   Title  STG=LTG        PT Long Term Goals - 10/27/17 1331      PT LONG TERM GOAL #1   Title  "Demonstrate understanding of proper sitting posture, body mechanics, work ergonomics, and be more conscious of position and posture throughout the day.     Time  6    Period  Weeks    Status  New    Target Date  12/08/17      PT LONG TERM GOAL #2   Title  "Pt will be independent with advanced HEP.     Time  6    Period  Weeks    Status  New    Target Date   12/08/17      PT LONG TERM GOAL #3   Title  "FOTO will improve from  43% limitataion  to 39% limitation    indicating improved functional mobility.     Time  6    Period  Weeks    Status  New    Target Date  12/08/17      PT LONG TERM GOAL #4   Title  Pt will be able to hang clothes on a clothes line without exacerbating neck pain > 2/10    Time  6    Period  Weeks    Status  New    Target Date  12/08/17      PT LONG TERM GOAL #5   Title  Pt will be able to stand to do chores for 1 hour in order to be able to complete household chores     Time  6    Period  Weeks    Status  New    Target Date  12/08/17      PT LONG TERM GOAL #6   Title  Pt will be able to rotate neck to 50 degrees bilaterally in order to sleep with greater comfort and 3-4 hours of uninterrupted sleep a night    Time  6    Period  Weeks    Status  New    Target Date  12/08/17      PT LONG TERM GOAL #7   Title  Pt will be able to perform ADL's( washing opposite arm pit ) without exacerbating pain with neck motion     Time  6    Period  Weeks    Status  New    Target Date  12/08/17            Plan - 11/07/17 1058    Clinical Impression Statement  Reported still sore but significantly less than before treatment today. Heavy emphasis on importance of posture and regular  stretching. Good tolerance to exercises    PT Treatment/Interventions  ADLs/Self Care Home Management;Cryotherapy;Electrical Stimulation;Iontophoresis 4mg /ml Dexamethasone;Moist Heat;Traction;Ultrasound;Patient/family education;Neuromuscular re-education;Therapeutic activities;Therapeutic exercise;Manual techniques;Passive range of motion;Dry needling;Taping    PT Next Visit Plan  outcomes with DN? DNF strengthening    PT Home Exercise Plan  neck stretches, levator, upper trap, shoulder shrug and neck retraction, scapular retraction, row, SNAGs rotation    Consulted and Agree with Plan of Care  Patient       Patient will benefit from  skilled therapeutic intervention in order to improve the following deficits and impairments:  Pain, Improper body mechanics, Postural dysfunction, Decreased range of motion, Decreased strength, Increased muscle spasms  Visit Diagnosis: Cervicalgia  Abnormal posture  Muscle weakness (generalized)     Problem List Patient Active Problem List   Diagnosis Date Noted  . Headache 09/08/2017  . Hematochezia 08/25/2017  . Bilateral calf pain 08/25/2017  . Bilateral knee pain 08/25/2017  . Health care maintenance 09/04/2015  . Tobacco abuse 09/04/2015   Bright Spielmann C. Ronella Plunk PT, DPT 11/07/17 11:01 AM   [place] [facility] [address] [place], [place], [address] Phone: [phone]   Fax:  [phone]  Name: Deborah Arroyo MRN: [id number] Date of Birth: 10-Dec-1950

## 2017-11-09 ENCOUNTER — Encounter: Payer: Self-pay | Admitting: Physical Therapy

## 2017-11-09 ENCOUNTER — Ambulatory Visit: Payer: Medicare Other | Admitting: Physical Therapy

## 2017-11-09 DIAGNOSIS — M6281 Muscle weakness (generalized): Secondary | ICD-10-CM | POA: Diagnosis not present

## 2017-11-09 DIAGNOSIS — M542 Cervicalgia: Secondary | ICD-10-CM | POA: Diagnosis not present

## 2017-11-09 DIAGNOSIS — R293 Abnormal posture: Secondary | ICD-10-CM

## 2017-11-09 NOTE — Therapy (Signed)
[place], [place], [address] Phone: [phone]   Fax:  [phone]  Physical Therapy Treatment  Patient Details  Name: PARNEET GLANTZ MRN: [id number] Date of Birth: 1951/02/01 Referring Provider: Metta Clines MD   Encounter Date: 11/09/2017  PT End of Session - 11/09/17 0931    Visit Number  3    Number of Visits  12    Date for PT Re-Evaluation  12/08/17    Authorization Type  UHC    PT Start Time  0931    PT Stop Time  1009    PT Time Calculation (min)  38 min    Activity Tolerance  Patient tolerated treatment well    Behavior During Therapy  [facility] for tasks assessed/performed       Past Medical History:  Diagnosis Date  . Cataract   . Cervical cancer ([place])   . Depression   . Osteopenia     Past Surgical History:  Procedure Laterality Date  . ABDOMINAL HYSTERECTOMY    . APPENDECTOMY    . CHOLECYSTECTOMY    . EYE SURGERY    . SHOULDER SURGERY Left    Rotary Cuff Tear  . TUBAL LIGATION      There were no vitals filed for this visit.  Subjective Assessment - 11/09/17 0931    Subjective  A little sore but not bad. I have been trying to move my head and stretch.     Patient Stated Goals  I want to be able to have no headaches and neck pain and sleep better at night. Takes 2 -3 hours to go to sleep.    Currently in Pain?  Yes    Pain Score  4     Pain Location  Neck    Pain Orientation  Right    Pain Descriptors / Indicators  -- sore         [facility] PT Assessment - 11/09/17 0001      AROM   Cervical - Right Rotation  70    Cervical - Left Rotation  65                  [facility] Adult PT Treatment/Exercise - 11/09/17 0001      Neck Exercises: Machines for Strengthening   Other Machines for Strengthening  nu step L4 UE & LE 6 min      Neck Exercises: Theraband   Rows  Green    Shoulder External Rotation  Red      Neck Exercises: Supine   Other Supine Exercise  GHJ flx  with wand, deep breathing at end range    Other Supine Exercise  GHJ protraction with wand      Manual Therapy   Manual Therapy  Joint mobilization    Joint Mobilization  gross lateral cervical mobilizations    Soft tissue mobilization  bilat upper trap, suboccipital release    Manual Traction  manual cervical traction      Neck Exercises: Stretches   Upper Trapezius Stretch  2 reps;20 seconds    Levator Stretch  2 reps;20 seconds    Other Neck Stretches  shoulder flexion stretch at table               PT Short Term Goals - 10/27/17 1230      PT SHORT TERM GOAL #1   Title  STG=LTG        PT Long Term Goals - 10/27/17 1331  PT LONG TERM GOAL #1   Title  "Demonstrate understanding of proper sitting posture, body mechanics, work ergonomics, and be more conscious of position and posture throughout the day.     Time  6    Period  Weeks    Status  New    Target Date  12/08/17      PT LONG TERM GOAL #2   Title  "Pt will be independent with advanced HEP.     Time  6    Period  Weeks    Status  New    Target Date  12/08/17      PT LONG TERM GOAL #3   Title  "FOTO will improve from  43% limitataion  to 39% limitation    indicating improved functional mobility.     Time  6    Period  Weeks    Status  New    Target Date  12/08/17      PT LONG TERM GOAL #4   Title  Pt will be able to hang clothes on a clothes line without exacerbating neck pain > 2/10    Time  6    Period  Weeks    Status  New    Target Date  12/08/17      PT LONG TERM GOAL #5   Title  Pt will be able to stand to do chores for 1 hour in order to be able to complete household chores     Time  6    Period  Weeks    Status  New    Target Date  12/08/17      PT LONG TERM GOAL #6   Title  Pt will be able to rotate neck to 50 degrees bilaterally in order to sleep with greater comfort and 3-4 hours of uninterrupted sleep a night    Time  6    Period  Weeks    Status  New    Target Date   12/08/17      PT LONG TERM GOAL #7   Title  Pt will be able to perform ADL's( washing opposite arm pit ) without exacerbating pain with neck motion     Time  6    Period  Weeks    Status  New    Target Date  12/08/17            Plan - 11/09/17 1009    Clinical Impression Statement  Pt denied modalities after treatment reporting "I feel fine"   Added more exercises for periscapular activaiton to reduce overuse of upper traps.     PT Treatment/Interventions  ADLs/Self Care Home Management;Cryotherapy;Electrical Stimulation;Iontophoresis 4mg /ml Dexamethasone;Moist Heat;Traction;Ultrasound;Patient/family education;Neuromuscular re-education;Therapeutic activities;Therapeutic exercise;Manual techniques;Passive range of motion;Dry needling;Taping    PT Next Visit Plan  DNF & periscap strengthening, DN PRN    PT Home Exercise Plan  neck stretches, levator, upper trap, shoulder shrug and neck retraction, scapular retraction, row, SNAGs rotation    Consulted and Agree with Plan of Care  Patient       Patient will benefit from skilled therapeutic intervention in order to improve the following deficits and impairments:  Pain, Improper body mechanics, Postural dysfunction, Decreased range of motion, Decreased strength, Increased muscle spasms  Visit Diagnosis: Cervicalgia  Abnormal posture  Muscle weakness (generalized)     Problem List Patient Active Problem List   Diagnosis Date Noted  . Headache 09/08/2017  . Hematochezia 08/25/2017  . Bilateral calf pain 08/25/2017  .  Bilateral knee pain 08/25/2017  . Health care maintenance 09/04/2015  . Tobacco abuse 09/04/2015  Vanesha Athens C. Sherley Mckenney PT, DPT 11/09/17 10:11 AM   [facility] [address] [place], [place], [address] Phone: [phone]   Fax:  [phone]  Name: MAKAYELA SECREST MRN: [id number] Date of Birth: 04/28/1951

## 2017-11-14 ENCOUNTER — Encounter: Payer: Self-pay | Admitting: Physical Therapy

## 2017-11-14 ENCOUNTER — Ambulatory Visit: Payer: Medicare Other | Admitting: Physical Therapy

## 2017-11-14 DIAGNOSIS — M542 Cervicalgia: Secondary | ICD-10-CM | POA: Diagnosis not present

## 2017-11-14 DIAGNOSIS — M6281 Muscle weakness (generalized): Secondary | ICD-10-CM | POA: Diagnosis not present

## 2017-11-14 DIAGNOSIS — R293 Abnormal posture: Secondary | ICD-10-CM | POA: Diagnosis not present

## 2017-11-14 NOTE — Therapy (Signed)
[place], [place], [address] Phone: [phone]   Fax:  [phone]  Physical Therapy Treatment  Patient Details  Name: Deborah Arroyo MRN: [id number] Date of Birth: 01-22-1951 Referring Provider: Metta Clines MD   Encounter Date: 11/14/2017  PT End of Session - 11/14/17 1016    Visit Number  4    Number of Visits  12    Date for PT Re-Evaluation  12/08/17    Authorization Type  UHC    PT Start Time  6468    PT Stop Time  1058    PT Time Calculation (min)  43 min    Activity Tolerance  Patient tolerated treatment well    Behavior During Therapy  [facility] for tasks assessed/performed       Past Medical History:  Diagnosis Date  . Cataract   . Cervical cancer ([place])   . Depression   . Osteopenia     Past Surgical History:  Procedure Laterality Date  . ABDOMINAL HYSTERECTOMY    . APPENDECTOMY    . CHOLECYSTECTOMY    . EYE SURGERY    . SHOULDER SURGERY Left    Rotary Cuff Tear  . TUBAL LIGATION      There were no vitals filed for this visit.  Subjective Assessment - 11/14/17 1016    Subjective  Feeling pretty good, not 100% but better than what it was. Unsure if she is going to be able to continue appointments for financial reasons.     Patient Stated Goals  I want to be able to have no headaches and neck pain and sleep better at night. Takes 2 -3 hours to go to sleep.    Currently in Pain?  Yes    Pain Score  4     Pain Location  Neck    Pain Orientation  Right    Pain Descriptors / Indicators  -- sore                      [facility] Adult PT Treatment/Exercise - 11/14/17 0001      Neck Exercises: Machines for Strengthening   UBE (Upper Arm Bike)  retro L1 4 min    Cybex Row  15lb    Other Machines for Strengthening  lat pull down 15lb      Neck Exercises: Supine   Shoulder Flexion  15 reps abd pull on yellow band    Shoulder ABduction  15 reps;Other (comment) horiz abd yellow  tband with breathing    Other Supine Exercise  abdominal breathing      Neck Exercises: Sidelying   Other Sidelying Exercise  GHJ ER 1# x20 each             PT Education - 11/14/17 1302    Education provided  Yes    Education Details  importance of reducing upper trap use, breathing with exercise, goals discussion    Person(s) Educated  Patient    Methods  Explanation;Demonstration;Tactile cues;Verbal cues;Handout    Comprehension  Tactile cues required;Verbal cues required;Returned demonstration;Verbalized understanding;Need further instruction       PT Short Term Goals - 10/27/17 1230      PT SHORT TERM GOAL #1   Title  STG=LTG        PT Long Term Goals - 11/14/17 1045      PT LONG TERM GOAL #1   Title  "Demonstrate understanding of proper sitting posture, body mechanics, work  ergonomics, and be more conscious of position and posture throughout the day.     Baseline  pt reports good posture 85% of the time    Status  Partially Met      PT LONG TERM GOAL #2   Title  "Pt will be independent with advanced HEP.     Baseline  reports doing exercises but not as much as you want me to    Status  Partially Met      PT LONG TERM GOAL #3   Title  "FOTO will improve from  43% limitataion  to 39% limitation    indicating improved functional mobility.     Baseline  not assessed       PT LONG TERM GOAL #4   Title  Pt will be able to hang clothes on a clothes line without exacerbating neck pain > 2/10    Baseline  4/10    Status  On-going      PT LONG TERM GOAL #5   Title  Pt will be able to stand to do chores for 1 hour in order to be able to complete household chores     Baseline  can go about 20-30 min    Status  On-going      PT LONG TERM GOAL #6   Title  Pt will be able to rotate neck to 50 degrees bilaterally in order to sleep with greater comfort and 3-4 hours of uninterrupted sleep a night    Baseline  sleeping up to 5 hr sometimes, able to rotate 50+ bilat     Status  Achieved      PT LONG TERM GOAL #7   Title  Pt will be able to perform ADL's( washing opposite arm pit ) without exacerbating pain with neck motion     Baseline  has to bend over to wash hair due to neck/shoulder pain    Status  On-going            Plan - 11/14/17 1257    Clinical Impression Statement  Pt required heavy cuing today to reduce use of upper traps for UE movement. Notable neck breather and practiced abdominal breathing to reduce cervical tension. Has a membership to [employer]. Making good progress toward goals.     PT Treatment/Interventions  ADLs/Self Care Home Management;Cryotherapy;Electrical Stimulation;Iontophoresis 5m/ml Dexamethasone;Moist Heat;Traction;Ultrasound;Patient/family education;Neuromuscular re-education;Therapeutic activities;Therapeutic exercise;Manual techniques;Passive range of motion;Dry needling;Taping    PT Next Visit Plan  DNF & periscap strengthening, DN PRN    PT Home Exercise Plan  neck stretches, levator, upper trap, shoulder shrug and neck retraction, scapular retraction, row, SNAGs rotation, abdominal breathing, supine horiz abd & flexion, sidelying GHJ ER, cervical rotation AROM    Consulted and Agree with Plan of Care  Patient       Patient will benefit from skilled therapeutic intervention in order to improve the following deficits and impairments:  Pain, Improper body mechanics, Postural dysfunction, Decreased range of motion, Decreased strength, Increased muscle spasms  Visit Diagnosis: Cervicalgia  Abnormal posture  Muscle weakness (generalized)     Problem List Patient Active Problem List   Diagnosis Date Noted  . Headache 09/08/2017  . Hematochezia 08/25/2017  . Bilateral calf pain 08/25/2017  . Bilateral knee pain 08/25/2017  . Health care maintenance 09/04/2015  . Tobacco abuse 09/04/2015    [place] C. Aeric [place] PT, DPT 11/14/17 1:03 PM   [facility] [address]Phone:  [phone]   Fax:  [phone]  Name: Deborah Arroyo MRN: [id number] Date of Birth: 04/06/1951

## 2017-11-16 ENCOUNTER — Encounter: Payer: Self-pay | Admitting: Physical Therapy

## 2017-11-16 ENCOUNTER — Ambulatory Visit: Payer: Medicare Other | Admitting: Physical Therapy

## 2017-11-16 DIAGNOSIS — M542 Cervicalgia: Secondary | ICD-10-CM | POA: Diagnosis not present

## 2017-11-16 DIAGNOSIS — M6281 Muscle weakness (generalized): Secondary | ICD-10-CM | POA: Diagnosis not present

## 2017-11-16 DIAGNOSIS — R293 Abnormal posture: Secondary | ICD-10-CM

## 2017-11-16 NOTE — Therapy (Signed)
[place], [place], [address] Phone: [phone]   Fax:  [phone]  Physical Therapy Treatment  Patient Details  Name: Deborah Arroyo MRN: [id number] Date of Birth: 05-15-1951 Referring Provider: Metta Clines MD   Encounter Date: 11/16/2017  PT End of Session - 11/16/17 1145    Visit Number  5    Number of Visits  12    Date for PT Re-Evaluation  12/08/17    Authorization Type  UHC    PT Start Time  9371    PT Stop Time  1228    PT Time Calculation (min)  43 min    Activity Tolerance  Patient tolerated treatment well    Behavior During Therapy  [facility] for tasks assessed/performed       Past Medical History:  Diagnosis Date  . Cataract   . Cervical cancer ([place])   . Depression   . Osteopenia     Past Surgical History:  Procedure Laterality Date  . ABDOMINAL HYSTERECTOMY    . APPENDECTOMY    . CHOLECYSTECTOMY    . EYE SURGERY    . SHOULDER SURGERY Left    Rotary Cuff Tear  . TUBAL LIGATION      There were no vitals filed for this visit.  Subjective Assessment - 11/16/17 1146    Subjective  Not having as many HA, still a little sore but much better.     Patient Stated Goals  I want to be able to have no headaches and neck pain and sleep better at night. Takes 2 -3 hours to go to sleep.    Currently in Pain?  Yes    Pain Score  2     Pain Location  Shoulder    Pain Orientation  Right                      [facility] Adult PT Treatment/Exercise - 11/16/17 0001      Neck Exercises: Seated   Shoulder Rolls  10 reps    Shoulder ABduction  10 reps    Other Seated Exercise  external rotation red tband      Neck Exercises: Prone   W Back  15 reps    Shoulder Extension  15 reps retraction + extension    Other Prone Exercise  scpaular retraction      Manual Therapy   Soft tissue mobilization  STM & IASTM Rt upper trap      Neck Exercises: Stretches   Other Neck Stretches  door  pec stretch               PT Short Term Goals - 10/27/17 1230      PT SHORT TERM GOAL #1   Title  STG=LTG        PT Long Term Goals - 11/14/17 1045      PT LONG TERM GOAL #1   Title  "Demonstrate understanding of proper sitting posture, body mechanics, work ergonomics, and be more conscious of position and posture throughout the day.     Baseline  pt reports good posture 85% of the time    Status  Partially Met      PT LONG TERM GOAL #2   Title  "Pt will be independent with advanced HEP.     Baseline  reports doing exercises but not as much as you want me to    Status  Partially Met  PT LONG TERM GOAL #3   Title  "FOTO will improve from  43% limitataion  to 39% limitation    indicating improved functional mobility.     Baseline  not assessed       PT LONG TERM GOAL #4   Title  Pt will be able to hang clothes on a clothes line without exacerbating neck pain > 2/10    Baseline  4/10    Status  On-going      PT LONG TERM GOAL #5   Title  Pt will be able to stand to do chores for 1 hour in order to be able to complete household chores     Baseline  can go about 20-30 min    Status  On-going      PT LONG TERM GOAL #6   Title  Pt will be able to rotate neck to 50 degrees bilaterally in order to sleep with greater comfort and 3-4 hours of uninterrupted sleep a night    Baseline  sleeping up to 5 hr sometimes, able to rotate 50+ bilat    Status  Achieved      PT LONG TERM GOAL #7   Title  Pt will be able to perform ADL's( washing opposite arm pit ) without exacerbating pain with neck motion     Baseline  has to bend over to wash hair due to neck/shoulder pain    Status  On-going            Plan - 11/16/17 1246    Clinical Impression Statement  Trigger point found in Rt upper trap re-created concordant pain into head and was reduced with manual treatment. Pt verbalized decreased soreness with cervical rotation. good tolerance to prone exercises with improved  control of upper trap use. Pt had to make some changes to schedule for financial reasons and will return in Feb. Verbalized comfort with HEP as it has been established and will re evaluation upon return.     PT Treatment/Interventions  ADLs/Self Care Home Management;Cryotherapy;Electrical Stimulation;Iontophoresis 79m/ml Dexamethasone;Moist Heat;Traction;Ultrasound;Patient/family education;Neuromuscular re-education;Therapeutic activities;Therapeutic exercise;Manual techniques;Passive range of motion;Dry needling;Taping    PT Next Visit Plan  DNF & periscap strengthening, DN PRN, overhead reaching    PT Home Exercise Plan  neck stretches, levator, upper trap, shoulder shrug and neck retraction, scapular retraction, row, SNAGs rotation, abdominal breathing, supine horiz abd & flexion, sidelying GHJ ER, cervical rotation AROM, prone retraction & extension, prone W, door pec stretch    Consulted and Agree with Plan of Care  Patient       Patient will benefit from skilled therapeutic intervention in order to improve the following deficits and impairments:  Pain, Improper body mechanics, Postural dysfunction, Decreased range of motion, Decreased strength, Increased muscle spasms  Visit Diagnosis: Cervicalgia  Abnormal posture  Muscle weakness (generalized)     Problem List Patient Active Problem List   Diagnosis Date Noted  . Headache 09/08/2017  . Hematochezia 08/25/2017  . Bilateral calf pain 08/25/2017  . Bilateral knee pain 08/25/2017  . Health care maintenance 09/04/2015  . Tobacco abuse 09/04/2015    Johna Kearl C. Rhayne Chatwin PT, DPT 11/16/17 12:51 PM   [facility] [address]Phone: [phone]  Fax:  [phone] Name: Deborah YOUNGERSMRN: [id number] of Birth: [birth year]

## 2017-11-21 ENCOUNTER — Encounter: Payer: Medicare Other | Admitting: Physical Therapy

## 2017-11-23 ENCOUNTER — Encounter: Payer: Medicare Other | Admitting: Physical Therapy

## 2017-11-28 ENCOUNTER — Encounter: Payer: Medicare Other | Admitting: Physical Therapy

## 2017-11-30 ENCOUNTER — Encounter: Payer: Medicare Other | Admitting: Physical Therapy

## 2017-12-05 ENCOUNTER — Ambulatory Visit: Payer: Medicare Other | Attending: Internal Medicine | Admitting: Physical Therapy

## 2017-12-05 ENCOUNTER — Encounter: Payer: Self-pay | Admitting: Physical Therapy

## 2017-12-05 DIAGNOSIS — M542 Cervicalgia: Secondary | ICD-10-CM | POA: Diagnosis not present

## 2017-12-05 DIAGNOSIS — R293 Abnormal posture: Secondary | ICD-10-CM | POA: Insufficient documentation

## 2017-12-05 DIAGNOSIS — M6281 Muscle weakness (generalized): Secondary | ICD-10-CM | POA: Diagnosis not present

## 2017-12-05 NOTE — Therapy (Signed)
[place], [place], [address] Phone: [phone]   Fax:  [phone]  Physical Therapy Treatment/Discharge summary  Patient Details  Name: Deborah Arroyo MRN: [id number] Date of Birth: 06-Jul-1951 Referring Provider: Metta Clines, MD   Encounter Date: 12/05/2017  PT End of Session - 12/05/17 1147    Visit Number  6    Number of Visits  12    Date for PT Re-Evaluation  12/08/17    Authorization Type  [employer]    PT Start Time  1148    PT Stop Time  1220    PT Time Calculation (min)  32 min    Activity Tolerance  Patient tolerated treatment well    Behavior During Therapy  [facility] for tasks assessed/performed       Past Medical History:  Diagnosis Date  . Cataract   . Cervical cancer ([place])   . Depression   . Osteopenia     Past Surgical History:  Procedure Laterality Date  . ABDOMINAL HYSTERECTOMY    . APPENDECTOMY    . CHOLECYSTECTOMY    . EYE SURGERY    . SHOULDER SURGERY Left    Rotary Cuff Tear  . TUBAL LIGATION      There were no vitals filed for this visit.  Subjective Assessment - 12/05/17 1148    Subjective  Overall feeling good. I bought some 5lb weights and a stretch band tha hooks to the door. Denies HA pain.     Patient Stated Goals  I want to be able to have no headaches and neck pain and sleep better at night. Takes 2 -3 hours to go to sleep.    Currently in Pain?  No/denies         [facility] PT Assessment - 12/05/17 0001      Assessment   Medical Diagnosis  right neck pain and cervicogenic headaches    Referring Provider  Metta Clines, MD      AROM   Cervical Flexion  60    Cervical - Right Rotation  73    Cervical - Left Rotation  72      Strength   Right Shoulder Flexion  5/5    Right Shoulder ABduction  5/5    Left Shoulder Flexion  5/5    Left Shoulder ABduction  4+/5                  [facility] Adult PT Treatment/Exercise - 12/05/17 0001      Neck Exercises:  Standing   Other Standing Exercises  overhead lifting 3# in each hand    Other Standing Exercises  upright row, bent over row             PT Education - 12/05/17 1240    Education provided  Yes    Education Details  importance of continued HEP, goals, FOTO, exercise form/rationale    Person(s) Educated  Patient    Methods  Explanation;Demonstration;Tactile cues;Verbal cues;Handout    Comprehension  Verbalized understanding;Returned demonstration;Verbal cues required;Tactile cues required       PT Short Term Goals - 10/27/17 1230      PT SHORT TERM GOAL #1   Title  STG=LTG        PT Long Term Goals - 12/05/17 1151      PT LONG TERM GOAL #1   Title  "Demonstrate understanding of proper sitting posture, body mechanics, work ergonomics, and be more conscious of position and posture  throughout the day.     Baseline  able to demo and verbalized ability    Status  Achieved      PT LONG TERM GOAL #2   Title  "Pt will be independent with advanced HEP.     Baseline  verbalized independence      PT LONG TERM GOAL #3   Title  "FOTO will improve from  43% limitataion  to 39% limitation    indicating improved functional mobility.     Baseline  22% limited at d/c    Status  Achieved      PT LONG TERM GOAL #4   Title  Pt will be able to hang clothes on a clothes line without exacerbating neck pain > 2/10    Baseline  able and does not demo shoulder hike    Status  Achieved      PT LONG TERM GOAL #5   Title  Pt will be able to stand to do chores for 1 hour in order to be able to complete household chores     Baseline  was able to clean fire pit, standing still for activities such as dishes creates LBP which can limit time standing    Status  Partially Met      PT LONG TERM GOAL #6   Title  Pt will be able to rotate neck to 50 degrees bilaterally in order to sleep with greater comfort and 3-4 hours of uninterrupted sleep a night    Baseline  sleeping up to 5 hr sometimes, able  to rotate 50+ bilat    Status  Achieved      PT LONG TERM GOAL #7   Title  Pt will be able to perform ADL's( washing opposite arm pit ) without exacerbating pain with neck motion     Baseline  able    Status  Achieved            Plan - 12/05/17 1242    Clinical Impression Statement  Pt has met her goals at this time and is prepared for d/c to independent program. Pt was encouraged to continue her HEP for long term strengthening and to contact us with any further questions.     PT Treatment/Interventions  ADLs/Self Care Home Management;Cryotherapy;Electrical Stimulation;Iontophoresis 21m/ml Dexamethasone;Moist Heat;Traction;Ultrasound;Patient/family education;Neuromuscular re-education;Therapeutic activities;Therapeutic exercise;Manual techniques;Passive range of motion;Dry needling;Taping    Consulted and Agree with Plan of Care  Patient       Patient will benefit from skilled therapeutic intervention in order to improve the following deficits and impairments:  Pain, Improper body mechanics, Postural dysfunction, Decreased range of motion, Decreased strength, Increased muscle spasms  Visit Diagnosis: Cervicalgia  Abnormal posture  Muscle weakness (generalized)     Problem List Patient Active Problem List   Diagnosis Date Noted  . Headache 09/08/2017  . Hematochezia 08/25/2017  . Bilateral calf pain 08/25/2017  . Bilateral knee pain 08/25/2017  . Health care maintenance 09/04/2015  . Tobacco abuse 09/04/2015   PHYSICAL THERAPY DISCHARGE SUMMARY  Visits from Start of Care: 6  Current functional level related to goals / functional outcomes: See above   Remaining deficits: See above   Education / Equipment: Anatomy of condition, POC, HEP, exercise form/rationale  Plan: Patient agrees to discharge.  Patient goals were met. Patient is being discharged due to meeting the stated rehab goals.  ?????     Tarra Pence C. Hollis Tuller PT, DPT 12/05/17 12:45 PM   [phone]  [place], [place], [address] Phone: [phone]   Fax:  [phone]  Name: Deborah Arroyo MRN: [id number] Date of Birth: Mar 11, 1951

## 2017-12-07 ENCOUNTER — Ambulatory Visit: Payer: Medicare Other | Admitting: Physical Therapy

## 2017-12-12 ENCOUNTER — Encounter: Payer: Medicare Other | Admitting: Physical Therapy

## 2017-12-14 ENCOUNTER — Other Ambulatory Visit: Payer: Self-pay

## 2017-12-14 ENCOUNTER — Ambulatory Visit (INDEPENDENT_AMBULATORY_CARE_PROVIDER_SITE_OTHER): Payer: Medicare Other | Admitting: Internal Medicine

## 2017-12-14 ENCOUNTER — Encounter: Payer: Self-pay | Admitting: Internal Medicine

## 2017-12-14 ENCOUNTER — Encounter (INDEPENDENT_AMBULATORY_CARE_PROVIDER_SITE_OTHER): Payer: Self-pay

## 2017-12-14 VITALS — BP 121/70 | HR 86 | Temp 97.5°F | Ht 61.0 in | Wt 122.1 lb

## 2017-12-14 DIAGNOSIS — G4486 Cervicogenic headache: Secondary | ICD-10-CM

## 2017-12-14 DIAGNOSIS — Z0000 Encounter for general adult medical examination without abnormal findings: Secondary | ICD-10-CM

## 2017-12-14 DIAGNOSIS — Z79899 Other long term (current) drug therapy: Secondary | ICD-10-CM

## 2017-12-14 DIAGNOSIS — M85859 Other specified disorders of bone density and structure, unspecified thigh: Secondary | ICD-10-CM

## 2017-12-14 DIAGNOSIS — M8580 Other specified disorders of bone density and structure, unspecified site: Secondary | ICD-10-CM | POA: Diagnosis not present

## 2017-12-14 DIAGNOSIS — R51 Headache: Secondary | ICD-10-CM

## 2017-12-14 DIAGNOSIS — Z720 Tobacco use: Secondary | ICD-10-CM

## 2017-12-14 DIAGNOSIS — K921 Melena: Secondary | ICD-10-CM

## 2017-12-14 DIAGNOSIS — F17210 Nicotine dependence, cigarettes, uncomplicated: Secondary | ICD-10-CM | POA: Diagnosis not present

## 2017-12-14 NOTE — Patient Instructions (Signed)
Ms. Rabel,  I am going to get you set up to see a stomach doctor and get set up for a colonoscopy.  Please start taking Calcium-Vitamin D supplements for you osteopenia.   Please follow up with me in 2 months for re-check.   Calcium; Vitamin D oral tablets What is this medicine? CALCIUM; VITAMIN D (KAL see um; VYE ta min D) is a vitamin supplement. It is used to prevent conditions of low calcium and vitamin D. This medicine may be used for other purposes; ask your health care provider or pharmacist if you have questions. COMMON BRAND NAME(S): Calcarb 600 with Vitamin D, Calcet Plus Vitamin D, Calcitrate + D, Calcium Citrate + D3 Maximum, Calcium Citrate Maximum with D, Caltrate, Caltrate 600+D, Citracal + D, Citracal MAXIMUM + D, Citracal Petites with Vitamin D, Citrus Calcium Plus D, OSCAL 500 + D, OSCAL Calcium + D3, OSCAL Extra D3, Osteo-Poretical, Oysco 500 + D, Oysco D, Oystercal-D Calcium What should I tell my health care provider before I take this medicine? They need to know if you have any of these conditions: -constipation -dehydration -heart disease -high level of calcium or vitamin D in the blood -high level of phosphate in the blood -kidney disease -kidney stones -liver disease -parathyroid disease -sarcoidosis -stomach ulcer or obstruction -an unusual or allergic reaction to calcium, vitamin D, tartrazine dye, other medicines, foods, dyes, or preservatives -pregnant or trying to get pregnant -breast-feeding How should I use this medicine? Take this medicine by mouth with a glass of water. Follow the directions on the label. Take with food or within 1 hour after a meal. Take your medicine at regular intervals. Do not take your medicine more often than directed. Talk to your pediatrician regarding the use of this medicine in children. While this medicine may be used in children for selected conditions, precautions do apply. Overdosage: If you think you have taken too much  of this medicine contact a poison control center or emergency room at once. NOTE: This medicine is only for you. Do not share this medicine with others. What if I miss a dose? If you miss a dose, take it as soon as you can. If it is almost time for your next dose, take only that dose. Do not take double or extra doses. What may interact with this medicine? Do not take this medicine with any of the following medications: -ammonium chloride -methenamine This medicine may also interact with the following medications: -antibiotics like ciprofloxacin, gatifloxacin, tetracycline -captopril -delavirdine -diuretics -gabapentin -iron supplements -medicines for fungal infections like ketoconazole and itraconazole -medicines for seizures like ethotoin and phenytoin -mineral oil -mycophenolate -other vitamins with calcium, vitamin D, or minerals -quinidine -rosuvastatin -sucralfate -thyroid medicine This list may not describe all possible interactions. Give your health care provider a list of all the medicines, herbs, non-prescription drugs, or dietary supplements you use. Also tell them if you smoke, drink alcohol, or use illegal drugs. Some items may interact with your medicine. What should I watch for while using this medicine? Taking this medicine is not a substitute for a well-balanced diet and exercise. Talk with your doctor or health care provider and follow a healthy lifestyle. Do not take this medicine with high-fiber foods, large amounts of alcohol, or drinks containing caffeine. Do not take this medicine within 2 hours of any other medicines. What side effects may I notice from receiving this medicine? Side effects that you should report to your doctor or health care professional as soon  as possible: -allergic reactions like skin rash, itching or hives, swelling of the face, lips, or tongue -confusion -dry mouth -high blood pressure -increased hunger or thirst -increased  urination -irregular heartbeat -metallic taste -muscle or bone pain -pain when urinating -seizure -unusually weak or tired -weight loss Side effects that usually do not require medical attention (report to your doctor or health care professional if they continue or are bothersome): -constipation -diarrhea -headache -loss of appetite -nausea, vomiting -stomach upset This list may not describe all possible side effects. Call your doctor for medical advice about side effects. You may report side effects to FDA at 1-800-FDA-1088. Where should I keep my medicine? Keep out of the reach of children. Store at room temperature between 15 and 30 degrees C (59 and 86 degrees F). Protect from light. Keep container tightly closed. Throw away any unused medicine after the expiration date. NOTE: This sheet is a summary. It may not cover all possible information. If you have questions about this medicine, talk to your doctor, pharmacist, or health care provider.  2018 Elsevier/Gold Standard (2008-01-31 17:56:23)

## 2017-12-14 NOTE — Progress Notes (Signed)
   CC: Headache follow up  HPI:  [name] is a 67 y.o. female with a past medical history listed below here today for follow up of her headaches.  For details of today's visit and the status of her chronic medical issues please refer to the assessment and plan.   Past Medical History:  Diagnosis Date  . Cataract   . Cervical cancer ([place])   . Depression   . Osteopenia    Review of Systems:   No chest pain or shortness of breath  Physical Exam:  Vitals:   12/14/17 1554  BP: 121/70  Pulse: 86  Temp: (!) 97.5 F (36.4 C)  TempSrc: Oral  SpO2: 95%  Weight: 122 lb 1.6 oz (55.4 kg)  Height: 5\' 1"  (1.549 m)   GENERAL- alert, co-operative, appears as stated age, not in any distress. HEENT- Atraumatic, normocephalic, PERRL, EOMI, oral mucosa appears moist. CARDIAC- RRR, no murmurs, rubs or gallops. RESP- Moving equal volumes of air, and clear to auscultation bilaterally, no wheezes or crackles. ABDOMEN- Soft, nontender, bowel sounds present. EXTREMITIES- pulse 2+, symmetric, no pedal edema. SKIN- Warm, dry, No rash or lesion. PSYCH- Normal mood and affect, appropriate thought content and speech.   Assessment & Plan:   See Encounters Tab for problem based charting.  Patient discussed with Dr. Rebeca Alert

## 2017-12-15 DIAGNOSIS — M8580 Other specified disorders of bone density and structure, unspecified site: Secondary | ICD-10-CM

## 2017-12-15 DIAGNOSIS — M810 Age-related osteoporosis without current pathological fracture: Secondary | ICD-10-CM | POA: Insufficient documentation

## 2017-12-15 LAB — HEPATITIS C ANTIBODY: Hep C Virus Ab: <0.1 s/co ratio (ref 0.0–0.9)

## 2017-12-15 LAB — CBC
Hematocrit: 42.3 % (ref 34.0–46.6)
Hemoglobin: 14.7 g/dL (ref 11.1–15.9)
MCH: 31.3 pg (ref 26.6–33.0)
MCHC: 34.8 g/dL (ref 31.5–35.7)
MCV: 90 fL (ref 79–97)
Platelets: 431 10*3/uL — ABNORMAL HIGH (ref 150–379)
RBC: 4.7 x10E6/uL (ref 3.77–5.28)
RDW: 12.9 % (ref 12.3–15.4)
WBC: 11.7 10*3/uL — ABNORMAL HIGH (ref 3.4–10.8)

## 2017-12-15 NOTE — Assessment & Plan Note (Signed)
DEXA report from 2017 notable for osteopenia. Advised patient to start Ca and Vit-D today. She is very hesitant to start any medications. Will continue discussions with patient at future visits. If she becomes agreeable to starting a bisphosphonate if needed would consider repeating DEXA in the future given her risk factors.

## 2017-12-15 NOTE — Assessment & Plan Note (Addendum)
She was seen by Dr. Tomi Likens, neurology, 10/07/17. She had an MRI/MRA of her head done that was normal for age without evidence of aneurysm. She was also started on gabapentin, advised to limit her tylenol usage, and referred to PT for her neck pain. She reports her headaches are much better. Notes she only takes Tylenol 3-4 times a month now.   Will continue current medications

## 2017-12-15 NOTE — Assessment & Plan Note (Addendum)
HCV Ab negative today

## 2017-12-15 NOTE — Assessment & Plan Note (Addendum)
She notes she went and saw 'some nurse' about colon prep. She says that she was told she could not eat anything for 24 hours before the colonoscopy and decided that she did not wish to do this and never followed up.   Since our last visit she reports one more episode of large volume bloody stool with clots. She also notes blood in the toilet bowl after bowel moments 3-4 times a week. She denies any other symptoms; no hematuria, vaginal bleeding, chest pain, dyspnea with exertion, lightheadedness/dizziness. She report using Tylenol 3-4 times a month now and is not taking any NSAIDs at this time.   A/P Discussed with patient the need for colonoscopy for further evaluation. She is agreeable to evaluation and appointment with GI was scheduled prior to leaving clinic today.   CBC today unremarkable with Hgb 14.7

## 2017-12-15 NOTE — Assessment & Plan Note (Signed)
Continues to smoke 1-1.5 packs per day. She reports no interest in quitting smoking at this time. Patient remains in the pre-contemplative stage. Will continue to address at future visits and encourage cessation.

## 2017-12-16 NOTE — Progress Notes (Signed)
Internal Medicine Clinic Attending  Case discussed with Dr. Charlynn Grimes  soon after the resident saw the patient.  We reviewed the resident's history and exam and pertinent patient test results.  I agree with the assessment, diagnosis, and plan of care documented in the resident's note.  Oda Kilts, MD

## 2018-01-18 ENCOUNTER — Ambulatory Visit: Payer: Medicare Other | Admitting: Neurology

## 2018-01-30 DIAGNOSIS — Z1231 Encounter for screening mammogram for malignant neoplasm of breast: Secondary | ICD-10-CM | POA: Diagnosis not present

## 2018-01-30 DIAGNOSIS — Z803 Family history of malignant neoplasm of breast: Secondary | ICD-10-CM | POA: Diagnosis not present

## 2018-02-02 DIAGNOSIS — N6310 Unspecified lump in the right breast, unspecified quadrant: Secondary | ICD-10-CM | POA: Diagnosis not present

## 2018-02-02 DIAGNOSIS — N6313 Unspecified lump in the right breast, lower outer quadrant: Secondary | ICD-10-CM | POA: Diagnosis not present

## 2018-02-14 ENCOUNTER — Ambulatory Visit: Payer: Medicare Other | Admitting: Neurology

## 2018-02-15 ENCOUNTER — Other Ambulatory Visit: Payer: Self-pay

## 2018-02-15 ENCOUNTER — Encounter: Payer: Medicare Other | Admitting: Internal Medicine

## 2018-02-15 ENCOUNTER — Ambulatory Visit (AMBULATORY_SURGERY_CENTER): Payer: Self-pay | Admitting: *Deleted

## 2018-02-15 VITALS — Ht 61.0 in | Wt 118.4 lb

## 2018-02-15 DIAGNOSIS — K921 Melena: Secondary | ICD-10-CM

## 2018-02-15 MED ORDER — NA SULFATE-K SULFATE-MG SULF 17.5-3.13-1.6 GM/177ML PO SOLN: 1.0000 | Freq: Once | ORAL | 0 refills | Status: AC

## 2018-02-15 NOTE — Progress Notes (Signed)
No egg or soy allergy known to patient  No issues with past sedation with any surgeries  or procedures, no intubation problems  No diet pills per patient No home 02 use per patient  No blood thinners per patient  Pt denies issues with constipation  No A fib or A flutter  EMMI video sent to pt's e mail - pt has no e mail  Pt has a suprep kit at home from the fall- called [facility] and cancelled prep I sent in today  Pt states has had rectal bleeding over 1 year - sometimes colors the water red- occ has blood clots

## 2018-03-01 ENCOUNTER — Encounter: Payer: Self-pay | Admitting: Internal Medicine

## 2018-03-01 ENCOUNTER — Other Ambulatory Visit: Payer: Self-pay

## 2018-03-01 ENCOUNTER — Telehealth: Payer: Self-pay

## 2018-03-01 ENCOUNTER — Other Ambulatory Visit: Payer: Self-pay | Admitting: Internal Medicine

## 2018-03-01 ENCOUNTER — Other Ambulatory Visit (INDEPENDENT_AMBULATORY_CARE_PROVIDER_SITE_OTHER): Payer: Medicare Other

## 2018-03-01 ENCOUNTER — Ambulatory Visit (AMBULATORY_SURGERY_CENTER): Payer: Medicare Other | Admitting: Internal Medicine

## 2018-03-01 VITALS — BP 124/70 | HR 86 | Temp 97.3°F | Resp 16 | Ht 61.0 in | Wt 122.0 lb

## 2018-03-01 DIAGNOSIS — C20 Malignant neoplasm of rectum: Secondary | ICD-10-CM | POA: Diagnosis not present

## 2018-03-01 DIAGNOSIS — C801 Malignant (primary) neoplasm, unspecified: Secondary | ICD-10-CM

## 2018-03-01 DIAGNOSIS — K6289 Other specified diseases of anus and rectum: Secondary | ICD-10-CM

## 2018-03-01 DIAGNOSIS — K629 Disease of anus and rectum, unspecified: Secondary | ICD-10-CM

## 2018-03-01 DIAGNOSIS — C189 Malignant neoplasm of colon, unspecified: Secondary | ICD-10-CM | POA: Diagnosis not present

## 2018-03-01 DIAGNOSIS — K921 Melena: Secondary | ICD-10-CM | POA: Diagnosis not present

## 2018-03-01 HISTORY — PX: COLONOSCOPY: SHX174

## 2018-03-01 HISTORY — DX: Malignant (primary) neoplasm, unspecified: C80.1

## 2018-03-01 LAB — BASIC METABOLIC PANEL
BUN: 5 mg/dL — AB (ref 6–23)
CHLORIDE: 103 meq/L (ref 96–112)
CO2: 24 mEq/L (ref 19–32)
CREATININE: 0.66 mg/dL (ref 0.40–1.20)
Calcium: 9.3 mg/dL (ref 8.4–10.5)
GFR: 94.76 mL/min (ref >60.00)
GLUCOSE: 99 mg/dL (ref 70–99)
POTASSIUM: 4.4 meq/L (ref 3.5–5.1)
Sodium: 136 mEq/L (ref 135–145)

## 2018-03-01 MED ORDER — SODIUM CHLORIDE 0.9 % IV SOLN: 500.0000 mL | Freq: Once | INTRAVENOUS | Status: DC

## 2018-03-01 NOTE — Telephone Encounter (Signed)
Pt said she is returning your call [phone]

## 2018-03-01 NOTE — Progress Notes (Signed)
Pt's states no medical or surgical changes since previsit or office visit. 

## 2018-03-01 NOTE — Telephone Encounter (Signed)
Pt scheduled for CT of CAP at [place] [facility] 03/03/18@10am , pt to arrive at 9:45am. Pt to be NPO after midnight and drink bottle 1 of contrast at 8am and bottle 2 at 9am. Left message for pt regarding appt date and time per her request. Requested she return my call to confirm she received the message.

## 2018-03-01 NOTE — Progress Notes (Signed)
Called to room to assist during endoscopic procedure.  Patient ID and intended procedure confirmed with present staff. Received instructions for my participation in the procedure from the performing physician.  

## 2018-03-01 NOTE — Patient Instructions (Signed)
YOU HAD AN ENDOSCOPIC PROCEDURE TODAY AT [facility]:   Refer to the procedure report that was given to you for any specific questions about what was found during the examination.  If the procedure report does not answer your questions, please call your gastroenterologist to clarify.  If you requested that your care partner not be given the details of your procedure findings, then the procedure report has been included in a sealed envelope for you to review at your convenience later.  YOU SHOULD EXPECT: Some feelings of bloating in the abdomen. Passage of more gas than usual.  Walking can help get rid of the air that was put into your GI tract during the procedure and reduce the bloating. If you had a lower endoscopy (such as a colonoscopy or flexible sigmoidoscopy) you may notice spotting of blood in your stool or on the toilet paper. If you underwent a bowel prep for your procedure, you may not have a normal bowel movement for a few days.  Please Note:  You might notice some irritation and congestion in your nose or some drainage.  This is from the oxygen used during your procedure.  There is no need for concern and it should clear up in a day or so.  SYMPTOMS TO REPORT IMMEDIATELY:   Following lower endoscopy (colonoscopy or flexible sigmoidoscopy):  Excessive amounts of blood in the stool  Significant tenderness or worsening of abdominal pains  Swelling of the abdomen that is new, acute  Fever of 100F or higher   Following upper endoscopy (EGD)  Vomiting of blood or coffee ground material  New chest pain or pain under the shoulder blades  Painful or persistently difficult swallowing  New shortness of breath  Fever of 100F or higher  Black, tarry-looking stools  For urgent or emergent issues, a gastroenterologist can be reached at any hour by calling [phone].   DIET:  We do recommend a small meal at first, but then you may proceed to your regular diet.  Drink  plenty of fluids but you should avoid alcoholic beverages for 24 hours.  ACTIVITY:  You should plan to take it easy for the rest of today and you should NOT DRIVE or use heavy machinery until tomorrow (because of the sedation medicines used during the test).    FOLLOW UP: Our staff will call the number listed on your records the next business day following your procedure to check on you and address any questions or concerns that you may have regarding the information given to you following your procedure. If we do not reach you, we will leave a message.  However, if you are feeling well and you are not experiencing any problems, there is no need to return our call.  We will assume that you have returned to your regular daily activities without incident.  If any biopsies were taken you will be contacted by phone or by letter within the next 1-3 weeks.  Please call us at [phone] if you have not heard about the biopsies in 3 weeks.    SIGNATURES/CONFIDENTIALITY: You and/or your care partner have signed paperwork which will be entered into your electronic medical record.  These signatures attest to the fact that that the information above on your After Visit Summary has been reviewed and is understood.  Full responsibility of the confidentiality of this discharge information lies with you and/or your care-partner.YOU HAD AN ENDOSCOPIC PROCEDURE TODAY AT [facility]:  Refer to the procedure report that was given to you for any specific questions about what was found during the examination.  If the procedure report does not answer your questions, please call your gastroenterologist to clarify.  If you requested that your care partner not be given the details of your procedure findings, then the procedure report has been included in a sealed envelope for you to review at your convenience later.  YOU SHOULD EXPECT: Some feelings of bloating in the abdomen. Passage of more gas than  usual.  Walking can help get rid of the air that was put into your GI tract during the procedure and reduce the bloating. If you had a lower endoscopy (such as a colonoscopy or flexible sigmoidoscopy) you may notice spotting of blood in your stool or on the toilet paper. If you underwent a bowel prep for your procedure, you may not have a normal bowel movement for a few days.  Please Note:  You might notice some irritation and congestion in your nose or some drainage.  This is from the oxygen used during your procedure.  There is no need for concern and it should clear up in a day or so.  SYMPTOMS TO REPORT IMMEDIATELY:   Following lower endoscopy (colonoscopy or flexible sigmoidoscopy):  Excessive amounts of blood in the stool  Significant tenderness or worsening of abdominal pains  Swelling of the abdomen that is new, acute  Fever of 100F or higher   Following upper endoscopy (EGD)  Vomiting of blood or coffee ground material  New chest pain or pain under the shoulder blades  Painful or persistently difficult swallowing  New shortness of breath  Fever of 100F or higher  Black, tarry-looking stools  For urgent or emergent issues, a gastroenterologist can be reached at any hour by calling [phone].   DIET:  We do recommend a small meal at first, but then you may proceed to your regular diet.  Drink plenty of fluids but you should avoid alcoholic beverages for 24 hours.  ACTIVITY:  You should plan to take it easy for the rest of today and you should NOT DRIVE or use heavy machinery until tomorrow (because of the sedation medicines used during the test).    FOLLOW UP: Our staff will call the number listed on your records the next business day following your procedure to check on you and address any questions or concerns that you may have regarding the information given to you following your procedure. If we do not reach you, we will leave a message.  However, if you are feeling  well and you are not experiencing any problems, there is no need to return our call.  We will assume that you have returned to your regular daily activities without incident.  If any biopsies were taken you will be contacted by phone or by letter within the next 1-3 weeks.  Please call us at [phone] if you have not heard about the biopsies in 3 weeks.    SIGNATURES/CONFIDENTIALITY: You and/or your care partner have signed paperwork which will be entered into your electronic medical record.  These signatures attest to the fact that that the information above on your After Visit Summary has been reviewed and is understood.  Full responsibility of the confidentiality of this discharge information lies with you and/or your care-partner.  CT to be scheduled.  Dr. Vena Rua office will call you.

## 2018-03-01 NOTE — Progress Notes (Signed)
Pt. To lab prior to discharge.

## 2018-03-01 NOTE — Progress Notes (Signed)
Spoke with Rosanne Sack RN re scheduling CT for pt.  She will phone pt. With time and details.  Gave pt. CT contrast and instruction sheet.

## 2018-03-01 NOTE — Op Note (Signed)
[facility] Patient Name: Deborah Arroyo Procedure Date: 03/01/2018 9:26 AM MRN: [id number] Endoscopist: Jerene Bears , MD Age: 67 Referring MD:  Date of Birth: 1951-03-01 Gender: Female Account #: [id number] Procedure:                Colonoscopy Indications:              Hematochezia, This is the patient's first                            colonoscopy Medicines:                Monitored Anesthesia Care Procedure:                Pre-Anesthesia Assessment:                           - Prior to the procedure, a History and Physical                            was performed, and patient medications and                            allergies were reviewed. The patient's tolerance of                            previous anesthesia was also reviewed. The risks                            and benefits of the procedure and the sedation                            options and risks were discussed with the patient.                            All questions were answered, and informed consent                            was obtained. Prior Anticoagulants: The patient has                            taken no previous anticoagulant or antiplatelet                            agents. ASA Grade Assessment: II - A patient with                            mild systemic disease. After reviewing the risks                            and benefits, the patient was deemed in                            satisfactory condition to undergo the procedure.  After obtaining informed consent, the colonoscope                            was passed under direct vision. Throughout the                            procedure, the patient's blood pressure, pulse, and                            oxygen saturations were monitored continuously. The                            Colonoscope was introduced through the anus with                            the intention of advancing to the cecum. The scope                    was advanced to the rectum before the procedure was                            aborted. Medications were given. The Endoscope was                            introduced through the and advanced to the. The                            colonoscopy was performed with difficulty due to a                            partially obstructing mass. The patient tolerated                            the procedure well. The quality of the bowel                            preparation was good. The rectum was photographed. Scope In: 9:46:00 AM Scope Out: 9:55:24 AM Total Procedure Duration: 0 hours 9 minutes 24 seconds  Findings:                 The digital rectal exam revealed a hard rectal mass                            at the tip of the examining finger.                           A fungating partially obstructing large mass was                            found in the proximal rectum and in the                            recto-sigmoid colon. The mass was circumferential.  Oozing was present. Despite changing to the adult                            upper endoscope the mass could not be traversed,                            though it appears to extend over 5 cm in length.                            Multiple biopsies were obtained with cold forceps                            for histology in a targeted manner. Area distal to                            the mass was tattooed with injections totalling 4                            mL of Spot (carbon black).                           No additional abnormalities were found on                            retroflexion. Complications:            No immediate complications. Estimated Blood Loss:     Estimated blood loss was minimal. Impression:               - Malignant partially obstructing tumor in the                            proximal rectum and in the recto-sigmoid colon.                            Tattooed.                            - Multiple biopsies were obtained. Recommendation:           - Patient has a contact number available for                            emergencies. The signs and symptoms of potential                            delayed complications were discussed with the                            patient. Return to normal activities tomorrow.                            Written discharge instructions were provided to the  patient.                           - Resume previous diet.                           - Continue present medications.                           - Await pathology results.                           - Perform a CT scan (computed tomography) of chest                            with contrast, abdomen with contrast and pelvis                            with contrast at appointment to be scheduled.                           - Surgical and oncology referrals ASAP.                           - Repeat colonoscopy after surgery because the                            examination was incomplete. Jerene Bears, MD 03/01/2018 10:08:41 AM This report has been signed electronically.

## 2018-03-01 NOTE — Progress Notes (Signed)
To recovery, report to RN, VSS. 

## 2018-03-01 NOTE — Telephone Encounter (Signed)
Spoke with pt and she is aware of appt and prep.

## 2018-03-01 NOTE — Progress Notes (Signed)
Patient had colonoscopy done today that demonstrated a fungating partially obstructing large mass was found in the proximal rectum and in the recto-sigmoid colon. She has been scheduled for CT chest/abd/pelvis by GI. She will need to be seen by surgery and oncology.

## 2018-03-01 NOTE — Progress Notes (Signed)
bmet  

## 2018-03-02 ENCOUNTER — Telehealth: Payer: Self-pay

## 2018-03-02 ENCOUNTER — Other Ambulatory Visit: Payer: Self-pay

## 2018-03-02 ENCOUNTER — Telehealth: Payer: Self-pay | Admitting: Oncology

## 2018-03-02 ENCOUNTER — Encounter: Payer: Self-pay | Admitting: Oncology

## 2018-03-02 DIAGNOSIS — K6289 Other specified diseases of anus and rectum: Secondary | ICD-10-CM

## 2018-03-02 DIAGNOSIS — C20 Malignant neoplasm of rectum: Secondary | ICD-10-CM

## 2018-03-02 NOTE — Telephone Encounter (Signed)
Referral faxed to [facility] for rectal mass, ambulatory referral entered in epic for [facility] appt, note sent to [facility].

## 2018-03-02 NOTE — Telephone Encounter (Signed)
  Follow up Call-  Call back number 03/01/2018  Post procedure Call Back phone  # [phone]  Permission to leave phone message Yes  Some recent data might be hidden     Patient questions:  Do you have a fever, pain , or abdominal swelling? No. Pain Score  0 *  Have you tolerated food without any problems? Yes.    Have you been able to return to your normal activities? Yes.    Do you have any questions about your discharge instructions: Diet   No. Medications  No. Follow up visit  No.  Do you have questions or concerns about your Care? No.  Actions: * If pain score is 4 or above: No action needed, pain <4.

## 2018-03-02 NOTE — Telephone Encounter (Signed)
Pt cld and confirmed appt with Dr. Benay Spice

## 2018-03-02 NOTE — Telephone Encounter (Signed)
Appt has been scheduled for the pt to see Dr. Benay Spice on 5/6 at 2pm. Lft the pt an vm with the appt date and time. Letter mailed.

## 2018-03-03 ENCOUNTER — Ambulatory Visit (INDEPENDENT_AMBULATORY_CARE_PROVIDER_SITE_OTHER)
Admission: RE | Admit: 2018-03-03 | Discharge: 2018-03-03 | Disposition: A | Discharge status: Home / Self Care | Payer: Medicare Other | Source: Ambulatory Visit | Attending: Internal Medicine | Admitting: Internal Medicine

## 2018-03-03 ENCOUNTER — Encounter: Payer: Self-pay | Admitting: Radiation Oncology

## 2018-03-03 DIAGNOSIS — N2889 Other specified disorders of kidney and ureter: Secondary | ICD-10-CM | POA: Diagnosis not present

## 2018-03-03 DIAGNOSIS — K629 Disease of anus and rectum, unspecified: Secondary | ICD-10-CM

## 2018-03-03 DIAGNOSIS — K6289 Other specified diseases of anus and rectum: Secondary | ICD-10-CM

## 2018-03-03 DIAGNOSIS — J439 Emphysema, unspecified: Secondary | ICD-10-CM | POA: Diagnosis not present

## 2018-03-03 DIAGNOSIS — Z1212 Encounter for screening for malignant neoplasm of rectum: Secondary | ICD-10-CM | POA: Diagnosis not present

## 2018-03-03 LAB — CEA: CEA: 1.9 ng/mL

## 2018-03-03 MED ORDER — IOPAMIDOL (ISOVUE-300) INJECTION 61%
100.0000 mL | Freq: Once | INTRAVENOUS | Status: AC | PRN
  Administered 2018-03-03: 100 mL via INTRAVENOUS

## 2018-03-03 NOTE — Telephone Encounter (Signed)
[facility] called and states that patient is scheduled to see Dr. Dema Severin on 03/08/18 @ 8:45am. The lady there said that she was having a hard time getting in touch with patient and asked me to call patient. I spoke to patient and gave her date and time of this appointment. Patient became very upset and said that "she doesn't want to go to all of these appointments and may just cancel them all and wait to die"

## 2018-03-06 ENCOUNTER — Inpatient Hospital Stay: Payer: Medicare Other | Attending: Oncology | Admitting: Oncology

## 2018-03-06 ENCOUNTER — Encounter: Payer: Self-pay | Admitting: Oncology

## 2018-03-06 DIAGNOSIS — Z8041 Family history of malignant neoplasm of ovary: Secondary | ICD-10-CM

## 2018-03-06 DIAGNOSIS — C20 Malignant neoplasm of rectum: Secondary | ICD-10-CM

## 2018-03-06 DIAGNOSIS — Z801 Family history of malignant neoplasm of trachea, bronchus and lung: Secondary | ICD-10-CM | POA: Diagnosis not present

## 2018-03-06 DIAGNOSIS — Z808 Family history of malignant neoplasm of other organs or systems: Secondary | ICD-10-CM

## 2018-03-06 DIAGNOSIS — F17210 Nicotine dependence, cigarettes, uncomplicated: Secondary | ICD-10-CM

## 2018-03-06 DIAGNOSIS — Z803 Family history of malignant neoplasm of breast: Secondary | ICD-10-CM

## 2018-03-06 DIAGNOSIS — Z8042 Family history of malignant neoplasm of prostate: Secondary | ICD-10-CM | POA: Diagnosis not present

## 2018-03-06 DIAGNOSIS — Z800 Family history of malignant neoplasm of digestive organs: Secondary | ICD-10-CM

## 2018-03-06 DIAGNOSIS — M8580 Other specified disorders of bone density and structure, unspecified site: Secondary | ICD-10-CM | POA: Diagnosis not present

## 2018-03-06 DIAGNOSIS — J439 Emphysema, unspecified: Secondary | ICD-10-CM

## 2018-03-06 DIAGNOSIS — R911 Solitary pulmonary nodule: Secondary | ICD-10-CM

## 2018-03-06 DIAGNOSIS — F329 Major depressive disorder, single episode, unspecified: Secondary | ICD-10-CM | POA: Diagnosis not present

## 2018-03-06 HISTORY — DX: Malignant neoplasm of rectum: C20

## 2018-03-06 NOTE — Progress Notes (Signed)
Deborah Arroyo Consult   Referring MD: Zenovia Jarred  AALLIYAH KILKER 67 y.o.  04-04-1951    Reason for Referral: Rectal cancer   HPI: Deborah Arroyo was referred to Dr. Hilarie Fredrickson for evaluation of rectal bleeding.  She reports a one year history of rectal bleeding.  She was taken to a colonoscopy on 03/01/2018.  A rectal mass was palpated at the tip of the examination finger.  A partially obstructing mass was found in the proximal rectum and in the rectosigmoid colon.  The mass was circumferential with oozing present.  The mass could not be traversed.  Multiple biopsies were obtained.  The area distal to the mass was tattooed.  The pathology revealed adenocarcinoma.  She was referred for staging CTs of the chest, abdomen, and pelvis on 03/03/2018.  A precarinal node measures 0.9 cm.  There is a 1.2 cm right hilar node.  Bilateral pleuroparenchymal scarring.  Central lobular emphysema.  6 x 3 mm lingular nodule.  No evidence of liver metastases.  A mass is noted in the rectum.  The mass is located 4.9 cm from the anorectal junction.  Surrounding perirectal lymph nodes are present.  No additional abnormal lymph nodes.  She is scheduled to see Dr. Dema Severin on 03/08/2018.  She is scheduled to see Dr. Lisbeth Renshaw on 03/07/2018.  Past Medical History:  Diagnosis Date  . Cataracts   . Depression   . Hypoglycemia   . Hyponatremia    syncope with this and admitted twice due to this issue   . Osteopenia   . Rectal cancer  03/01/2018    .  G4, P3, Ab1  Past Surgical History:  Procedure Laterality Date  . ABDOMINAL HYSTERECTOMY    . APPENDECTOMY    . CHOLECYSTECTOMY    . EYE SURGERY    . SHOULDER SURGERY Left    Rotary Cuff Tear  . TUBAL LIGATION      Medications: Reviewed  Allergies:  Allergies  Allergen Reactions  . Naproxen Sodium [Naproxen Sodium] Other (See Comments)    Eyes and throat swell up    Family history: Her mother had endometrial cancer.  A sister had stomach and  ovarian cancer.  Her daughter has had breast cancer twice in the same breast.  A brother had prostate cancer.  A brother had lung cancer.  A nephew had melanoma.  Social History:   She lives with her son and grandson in [place].  She smokes 1.5 packs of cigarettes per day.  She does not use alcohol.  No risk factor for HIV or hepatitis.  ROS:   Positives include: Rectal bleeding, lower lumbar/sacral pain, sticking sensation when she eats sandwiches  A complete ROS was otherwise negative.  Physical Exam:  Blood pressure 130/86, pulse 85, temperature 98.2 F (36.8 C), resp. rate 18, height 5\' 1"  (1.549 m), weight 118 lb 12.8 oz (53.9 kg), SpO2 100 %.  HEENT: Oropharynx without visible mass, neck without mass Lungs: Clear bilaterally, no respiratory distress Cardiac: Regular rate and rhythm Abdomen: No hepatosplenomegaly, no mass, nontender  Vascular: No leg edema Lymph nodes: "Shotty "bilateral axillary nodes.  No cervical, supraclavicular, or inguinal nodes Neurologic: Alert and oriented, the motor exam appears intact in the upper and lower extremities Skin: No rash Musculoskeletal: No spine tenderness   LAB: 03/01/2018: CEA-1.9   Imaging:  As per HPI-CT images from 03/03/2018-reviewed   Assessment/Plan:   1. Rectal cancer, obstructing rectal mass on colonoscopy 03/01/2018- biopsy confirmed adenocarcinoma  Incomplete colonoscopy secondary to obstructing tumor  Staging CTs on 03/03/2018- small mediastinal/hilar lymph nodes, indeterminate lingula nodule, rectal mass, small perirectal lymph nodes, mass measured at approximately 4.9 cm from the anal verge 2. Tobacco use 3. COPD 4. Family history of multiple cancers   Disposition:   [name] has been diagnosed with rectal cancer.  I discussed the clinical staging evaluation and treatment options.  She appears to have clinical stage III disease.  I recommend neoadjuvant therapy.  We discussed neoadjuvant  chemotherapy/radiation to be followed by surgery.  We also discussed a total neoadjuvant approach with a full course of chemotherapy and radiation given in the neoadjuvant setting.  I recommend a staging pelvic MRI.  [name] indicated repeatedly that she does not wish to consider neoadjuvant therapy.  She would like to have the tumor removed surgically.  She canceled her radiation oncology appointment scheduled for 03/07/2018.  I explained the rationale for and benefit associated with neoadjuvant therapy.  I will recommend adjuvant therapy if she has surgery.  [name]  would not agree to a follow-up visit here.  She states that she will contact us after the appointment with Dr. Dema Severin.  Her case will be presented at the GI tumor conference 03/15/2018.  I recommend a referral to the genetics counselor based on her personal and family history of cancer.  50 minutes were spent with the Arroyo today.  The majority of the time was used for counseling and coordination of care.  Betsy Coder, MD  03/06/2018, 2:57 PM

## 2018-03-06 NOTE — Progress Notes (Signed)
Met with patient, patient's daughter and patient's sister. Patient presented as irritable and at one point walked out of exam room and hit the wall after I introduced myself. Patient angry that she has to deal with cancer. I provided patient with a folder of information related to colorectal cancer and support groups at [facility]. Patient does not want to keep appointment with Dr. Lisbeth Renshaw on 03/07/18 but is planning on seeing Dr. Dema Severin on Wednesday for surgical consult. Patient's daughter understands that she can call us with questions or concerns.

## 2018-03-07 ENCOUNTER — Ambulatory Visit: Payer: Medicare Other | Admitting: Radiation Oncology

## 2018-03-07 ENCOUNTER — Other Ambulatory Visit: Payer: Medicare Other

## 2018-03-07 ENCOUNTER — Ambulatory Visit: Payer: Medicare Other

## 2018-03-08 ENCOUNTER — Other Ambulatory Visit: Payer: Self-pay | Admitting: Surgery

## 2018-03-08 DIAGNOSIS — C20 Malignant neoplasm of rectum: Secondary | ICD-10-CM | POA: Diagnosis not present

## 2018-03-11 ENCOUNTER — Ambulatory Visit
Admission: RE | Admit: 2018-03-11 | Discharge: 2018-03-11 | Disposition: A | Discharge status: Home / Self Care | Payer: Medicare Other | Source: Ambulatory Visit | Attending: Surgery | Admitting: Surgery

## 2018-03-11 DIAGNOSIS — C20 Malignant neoplasm of rectum: Secondary | ICD-10-CM

## 2018-03-11 DIAGNOSIS — C218 Malignant neoplasm of overlapping sites of rectum, anus and anal canal: Secondary | ICD-10-CM | POA: Diagnosis not present

## 2018-03-11 MED ORDER — GADOBENATE DIMEGLUMINE 529 MG/ML IV SOLN
10.0000 mL | Freq: Once | INTRAVENOUS | Status: AC | PRN
  Administered 2018-03-11: 10 mL via INTRAVENOUS

## 2018-03-16 DIAGNOSIS — C20 Malignant neoplasm of rectum: Secondary | ICD-10-CM | POA: Diagnosis not present

## 2018-03-17 NOTE — Progress Notes (Signed)
Called patient to confirm apt with Dr. Benay Spice on 03/22/18 @ 8:45 AM. Patient aware to arrive at 8:15 AM.

## 2018-03-21 NOTE — Progress Notes (Signed)
GI Location of Tumor / Histology: Rectal Cancer  Deborah Arroyo presented with one year history of rectal bleeding.  Colonoscopy 03/01/2018: Demonstrated a fungating partially obstructing large mass in the proximal rectum and in the recto-sigmoid colon.   CT chest/abd/pelvis 03/03/2018:  1. Large rectal mass, approximately 6 cm in length, located about 4.9 cm from the anorectal junction, with multiple surrounding perirectal/presacral lymph nodes in the 6-7 mm range likely representing local nodal metastatic spread. Although there is a mildly enlarged right hilar lymph node, I am skeptical that this would represent a distal metastatic lesion, and most likely this hilar node enlargement is incidental. 2. 6 by 3 mm lingular nodule, this vertical level has not been captured on prior CT exams. Fleischner guidelines for follow up do not apply since the patient has a known malignancy. Although likely benign, this may warrant surveillance. 3. Other imaging findings of potential clinical significance: Aortic Atherosclerosis and Emphysema. Biapical pleuroparenchymal scarring. Hypodense renal lesions are likely cysts but technically too small to characterize.  MR Pelvis W/Wo contrast 03/11/2018: Rectal adenocarcinoma, stage T3c, N2, MX by imaging.  Biopsies of rectal polyps  Past/Anticipated interventions by surgeon, if any:  Dr. Dema Arroyo 03/16/2018 -We again discussed at length the anatomy physiology of the GI tract, the pathophysiology of colorectal cancer, her diagnosis and particular, and all of the various treatment options available for her.  We discussed the significant potential benefits that can be derived from neoadjuvant chemoradiation in the setting of locally advanced rectal cancers.  From an introductory standpoint, we also revisited the surgical options available and the potential for improved oncologic outcomes if surgery is performed after neoadjuvant chemoradiation therapy in the setting of locally  advanced rectal cancer. - She again inquired today about what would happen if she had no further treatment for this and we discussed the potential for progression and perforation.  We discussed the potential for colostomy. -At this time, after having had more time to think about her options, she is interested in discussing further options for neoadjuvant therapy with Dr. Benay Arroyo and Dr. Lisbeth Arroyo and will send her back to be seen by them. -I have advised she continue taking daily miralax. -We will plan to see her back in follow-up at completion of neoadjuvant therapy if she elects to proceed with this, or sooner if necessary.  Past/Anticipated interventions by medical oncology, if any:  Dr. Benay Arroyo 03/06/2018 -I recommend neoadjuvant therapy.  We discussed neoadjuvant chemotherapy/radiation to be followed by surgery.  We also discussed a total neoadjuvant approach with a full course of chemotherapy and radiation given in the neoadjuvant setting.  I recommend a staging pelvic MRI. -Deborah Arroyo  would not agree to a follow-up visit here.  She states that she will contact us after the appointment with Dr. Dema Arroyo. -Her case will be presented at the GI tumor conference 03/15/2018.  I recommend a referral to the genetics counselor based on her personal and family history of cancer. -Appointment scheduled on 03/22/2018 @ 8:45 am. Talked about taking chemo pills starting in June.  Discussion of radiation treatment.  Weight changes, if any: No  Bowel/Bladder complaints, if any: Still having some bleeding with bowel movements, no pain associated.  Nausea / Vomiting, if any: No  Pain issues, if any:  No  Any blood per rectum:   Yes  BP 133/86 (BP Location: Right Arm, Patient Position: Sitting, Cuff Size: Normal)   Pulse 72   Temp 97.8 F (36.6 C) (Oral)   Resp 20  Ht 5\' 1"  (1.549 m)   Wt 118 lb 9.6 oz (53.8 kg)   SpO2 99%   BMI 22.41 kg/m    Wt Readings from Last 3 Encounters:  03/22/18 118 lb 9.6  oz (53.8 kg)  03/22/18 118 lb 9.6 oz (53.8 kg)  03/22/18 119 lb 4.8 oz (54.1 kg)   SAFETY ISSUES:  Prior radiation? No  Pacemaker/ICD? No  Possible current pregnancy? No  Is the patient on methotrexate? No   Current Complaints/Details: Would rather do surgery instead of chemo and radiation 5 days a week, "I don't know if I have the patience".

## 2018-03-22 ENCOUNTER — Other Ambulatory Visit: Payer: Self-pay

## 2018-03-22 ENCOUNTER — Inpatient Hospital Stay (HOSPITAL_BASED_OUTPATIENT_CLINIC_OR_DEPARTMENT_OTHER): Payer: Medicare Other | Admitting: Oncology

## 2018-03-22 ENCOUNTER — Inpatient Hospital Stay: Payer: Medicare Other

## 2018-03-22 ENCOUNTER — Ambulatory Visit
Admission: RE | Admit: 2018-03-22 | Discharge: 2018-03-22 | Disposition: A | Discharge status: Home / Self Care | Payer: Medicare Other | Source: Ambulatory Visit | Attending: Radiation Oncology | Admitting: Radiation Oncology

## 2018-03-22 ENCOUNTER — Encounter: Payer: Self-pay | Admitting: Radiation Oncology

## 2018-03-22 ENCOUNTER — Telehealth: Payer: Self-pay | Admitting: Pharmacist

## 2018-03-22 ENCOUNTER — Telehealth: Payer: Self-pay

## 2018-03-22 VITALS — BP 140/87 | HR 76 | Temp 97.6°F | Resp 18 | Ht 61.0 in | Wt 119.3 lb

## 2018-03-22 VITALS — BP 133/86 | HR 72 | Temp 97.8°F | Resp 20 | Ht 61.0 in | Wt 118.6 lb

## 2018-03-22 DIAGNOSIS — F329 Major depressive disorder, single episode, unspecified: Secondary | ICD-10-CM

## 2018-03-22 DIAGNOSIS — Z808 Family history of malignant neoplasm of other organs or systems: Secondary | ICD-10-CM | POA: Diagnosis not present

## 2018-03-22 DIAGNOSIS — C20 Malignant neoplasm of rectum: Secondary | ICD-10-CM | POA: Insufficient documentation

## 2018-03-22 DIAGNOSIS — Z79899 Other long term (current) drug therapy: Secondary | ICD-10-CM | POA: Diagnosis not present

## 2018-03-22 DIAGNOSIS — Z803 Family history of malignant neoplasm of breast: Secondary | ICD-10-CM | POA: Diagnosis not present

## 2018-03-22 DIAGNOSIS — J439 Emphysema, unspecified: Secondary | ICD-10-CM

## 2018-03-22 DIAGNOSIS — M8580 Other specified disorders of bone density and structure, unspecified site: Secondary | ICD-10-CM | POA: Diagnosis not present

## 2018-03-22 DIAGNOSIS — Z800 Family history of malignant neoplasm of digestive organs: Secondary | ICD-10-CM | POA: Diagnosis not present

## 2018-03-22 DIAGNOSIS — Z8042 Family history of malignant neoplasm of prostate: Secondary | ICD-10-CM | POA: Diagnosis not present

## 2018-03-22 DIAGNOSIS — F17210 Nicotine dependence, cigarettes, uncomplicated: Secondary | ICD-10-CM | POA: Insufficient documentation

## 2018-03-22 DIAGNOSIS — E039 Hypothyroidism, unspecified: Secondary | ICD-10-CM | POA: Insufficient documentation

## 2018-03-22 DIAGNOSIS — Z9049 Acquired absence of other specified parts of digestive tract: Secondary | ICD-10-CM | POA: Diagnosis not present

## 2018-03-22 DIAGNOSIS — Z8041 Family history of malignant neoplasm of ovary: Secondary | ICD-10-CM | POA: Diagnosis not present

## 2018-03-22 DIAGNOSIS — R911 Solitary pulmonary nodule: Secondary | ICD-10-CM

## 2018-03-22 DIAGNOSIS — Z801 Family history of malignant neoplasm of trachea, bronchus and lung: Secondary | ICD-10-CM | POA: Diagnosis not present

## 2018-03-22 LAB — CBC WITH DIFFERENTIAL (CANCER CENTER ONLY)
BASOS ABS: 0.1 10*3/uL (ref 0.0–0.1)
BASOS PCT: 1 %
EOS ABS: 0.2 10*3/uL (ref 0.0–0.5)
EOS PCT: 2 %
HCT: 41 % (ref 34.8–46.6)
Hemoglobin: 13.8 g/dL (ref 11.6–15.9)
Lymphocytes Relative: 22 %
Lymphs Abs: 2.8 10*3/uL (ref 0.9–3.3)
MCH: 30.4 pg (ref 25.1–34.0)
MCHC: 33.7 g/dL (ref 31.5–36.0)
MCV: 90.1 fL (ref 79.5–101.0)
Monocytes Absolute: 0.9 10*3/uL (ref 0.1–0.9)
Monocytes Relative: 7 %
NEUTROS PCT: 68 %
Neutro Abs: 9 10*3/uL — ABNORMAL HIGH (ref 1.5–6.5)
PLATELETS: 392 10*3/uL (ref 145–400)
RBC: 4.55 MIL/uL (ref 3.70–5.45)
RDW: 14 % (ref 11.2–14.5)
WBC Count: 13 10*3/uL — ABNORMAL HIGH (ref 3.9–10.3)

## 2018-03-22 LAB — CMP (CANCER CENTER ONLY)
ALT: 11 U/L (ref 0–55)
AST: 17 U/L (ref 5–34)
Albumin: 3.7 g/dL (ref 3.5–5.0)
Alkaline Phosphatase: 107 U/L (ref 40–150)
Anion gap: 7 (ref 3–11)
BILIRUBIN TOTAL: 0.2 mg/dL (ref 0.2–1.2)
BUN: 11 mg/dL (ref 7–26)
CALCIUM: 9.6 mg/dL (ref 8.4–10.4)
CO2: 28 mmol/L (ref 22–29)
CREATININE: 0.73 mg/dL (ref 0.60–1.10)
Chloride: 98 mmol/L (ref 98–109)
GFR, Estimated: >60 mL/min (ref >60)
Glucose, Bld: 87 mg/dL (ref 70–140)
Potassium: 4.5 mmol/L (ref 3.5–5.1)
Sodium: 133 mmol/L — ABNORMAL LOW (ref 136–145)
TOTAL PROTEIN: 7.1 g/dL (ref 6.4–8.3)

## 2018-03-22 NOTE — Telephone Encounter (Signed)
Oral Oncology Pharmacist Encounter  Received new prescription for Xeloda (capecitabine) for the neoadjuvant treatment of stage III rectal cancer in conjunction with raditaion, planned duration 5-6 weeks.  Labs from Epic assessed, Page for treatment.  Current medication list in Epic reviewed, no DDIs with Xeloda identified.  Prescription will be e-scribed to the [facility] for benefits analysis and approval once received from MD.  Oral Oncology Clinic will continue to follow for insurance authorization, copayment issues, initial counseling and start date.  Johny Drilling, PharmD, BCPS, BCOP  03/22/2018 10:00 AM Oral Oncology Clinic [phone]

## 2018-03-22 NOTE — Progress Notes (Signed)
  [facility] OFFICE PROGRESS NOTE   Diagnosis: Rectal cancer  INTERVAL HISTORY:   Ms. Repetto saw Dr. Dema Severin.  He recommends neoadjuvant therapy.  She is now in agreement with neoadjuvant therapy.  She is having bowel movements.  She has intermittent rectal bleeding.  No other complaint. An MRI of the pelvis on 03/11/2018.  The tumor was staged as a T3cN2 lesion.  Tumor was noted to extend through the muscularis propria.  Multiple suspicious mesorectal lymph nodes.  Objective:  Vital signs in last 24 hours:  Blood pressure 140/87, pulse 76, temperature 97.6 F (36.4 C), temperature source Oral, resp. rate 18, height '5\' 1"'$  (1.549 m), weight 119 lb 4.8 oz (54.1 kg), SpO2 98 %.  Resp: Distant breath sounds with scattered rhonchi, no respiratory distress Cardio: Regular rate and rhythm GI: No hepatomegaly, no mass, nontender Vascular: No leg edema   Lab Results:  Lab Results  Component Value Date   WBC 13.0 (H) 03/22/2018   HGB 13.8 03/22/2018   HCT 41.0 03/22/2018   MCV 90.1 03/22/2018   PLT 392 03/22/2018   NEUTROABS 9.0 (H) 03/22/2018    CMP     Component Value Date/Time   NA 133 (L) 03/22/2018 1012   NA 135 08/24/2017 1531   K 4.5 03/22/2018 1012   CL 98 03/22/2018 1012   CO2 28 03/22/2018 1012   GLUCOSE 87 03/22/2018 1012   GLUCOSE 95 03/28/2014 1224   BUN 11 03/22/2018 1012   BUN 9 08/24/2017 1531   CREATININE 0.73 03/22/2018 1012   CALCIUM 9.6 03/22/2018 1012   PROT 7.1 03/22/2018 1012   PROT 6.9 08/24/2017 1531   ALBUMIN 3.7 03/22/2018 1012   ALBUMIN 4.4 08/24/2017 1531   AST 17 03/22/2018 1012   ALT 11 03/22/2018 1012   ALKPHOS 107 03/22/2018 1012   BILITOT 0.2 03/22/2018 1012   GFRNONAA >60 03/22/2018 1012   GFRAA >60 03/22/2018 1012      Medications: I have reviewed the patient's current medications.   Assessment/Plan: 1. Rectal cancer, obstructing rectal mass on colonoscopy 03/01/2018- biopsy confirmed adenocarcinoma ? Incomplete  colonoscopy secondary to obstructing tumor ? Staging CTs on 03/03/2018- small mediastinal/hilar lymph nodes, indeterminate lingula nodule, rectal mass, small perirectal lymph nodes, mass measured at approximately 4.9 cm from the anal verge ? MRI pelvis 03/11/2018- T3c,N2 2. Tobacco use 3. COPD 4. Family history of multiple cancers   Disposition: Ms. Stonerock has been diagnosed with locally advanced rectal cancer.  I discussed treatment options with her.  We discussed a total neoadjuvant approach with FOLFOX chemotherapy to be followed by chemotherapy/radiation.  We also discussed capecitabine/radiation to be followed by surgery and then adjuvant chemotherapy. She decided to begin treatment with capecitabine/radiation.  I reviewed the potential toxicities associated with capecitabine including the chance for nausea, alopecia, mucositis, diarrhea, and hematologic toxicity.  We discussed the sun sensitivity, rash, hyperpigmentation, and hand/foot syndrome associated with capecitabine.  She agrees to proceed.  She met with the cancer center pharmacist today.  I recommended she decrease smoking as this may diminish the chance of skin toxicity at the perineum.  I discussed the case with Dr. Lisbeth Renshaw.  The plan is to begin capecitabine/radiation on 04/03/2018.  Ms. Ringle will return for an office and lab visit on 04/14/2018.  25 minutes were spent with the patient today.  The majority of the time was used for counseling and coordination of care.  Betsy Coder, MD  03/22/2018  11:57 AM

## 2018-03-22 NOTE — Telephone Encounter (Signed)
Oral Chemotherapy Pharmacist Encounter   I spoke with patient and friend in exam room for overview of: Xeloda.   Counseled patient on administration, dosing, side effects, monitoring, drug-food interactions, safe handling, storage, and disposal.  Exact dose of Xeloda is not known at the time of initial counseling, however we discussed likely dose and pill burden for radiation course  Patient will take Xeloda 500mg  tablets, 2 or 3 tablets (1000mg  or 1500mg ) by mouth in AM and 2 tabs (1000mg ) by mouth in PM, within 30 minutes of finishing meals, on days of radiation only.  Xeloda and radiation start date: 04/03/18  Adverse effects of Xeloda include but are not limited to: fatigue, decreased blood counts, GI upset, diarrhea, and hand-foot syndrome.  Patient will be prescribed anti-emetic and knows to take it if nausea develops.   Patient will obtain anti diarrheal and alert the office of 4 or more loose stools above baseline.   Reviewed with patient importance of keeping a medication schedule and plan for any missed doses.  Ms. Lepak voiced understanding and appreciation.   All questions answered.  We will follow-up with patient after insurance authorization is obtained about copayment and Xeloda dispense date from the [facility].  I explained to patient she will likely receive 2 fills for entire course of Xeloda and that the pharmacy will help coordinate refill.   Medication reconciliation performed and medication/allergy list updated.  Patient knows to call the office with questions or concerns. Oral Oncology Clinic will continue to follow.  Thank you,  Johny Drilling, PharmD, BCPS, BCOP  03/22/2018  10:03 AM Oral Oncology Clinic [phone]

## 2018-03-22 NOTE — Telephone Encounter (Signed)
Printed avs and calender of upcoming appointment. Per 5/22 los 

## 2018-03-22 NOTE — Progress Notes (Addendum)
Radiation Oncology         [phone] ________________________________  Name: Deborah Arroyo        MRN: [id number]  Date of Service: 03/22/2018 DOB: 12/07/1950  CC:Maryellen Pile, MD  Ileana Roup, MD     REFERRING PHYSICIAN: Ileana Roup, MD   DIAGNOSIS: The encounter diagnosis was Rectal cancer [facility]).   HISTORY OF PRESENT ILLNESS: Deborah Arroyo is a 67 y.o. female seen at the request of Dr. Benay Spice with a newly diagnosed adenocarcinoma of the rectum. The patient had been experiencing rectal bleeding x 1 year. She presented ultimately and was taken to colonoscopy with Dr. Hilarie Fredrickson on 03/01/18 and this revealed a hard mass at the tip of the examining finger and fungating, partially obstructing mass in the proximal rectum and circumferential, and it could not be traversed. It was tattooed and was about 5 cm in length. Pathology revealed adenocarcinoma. She had a CEA that day as well that was 1.9. She had staging scans that revealed a large rectal mass measuring 6 cm in length and 4.9 cm from the anorectal junction, with multiple perirectal/presacral adenopathy. She also has a 6 x 3 mm lingular nodule. She has met with Dr. Benay Spice and Dr. Dema Severin and is considering neoadjuvant chemoRT. She also had an MRI on 03/13/18 revealing a 5.2 cm, T3c lesion which extends posteriorly and inferiorly through the muscularis. She has more than 4 mesorectal nodes the largest of which measures 8 mm, and no additional concerns for disease were noted. She comes today to discuss options of neoadjuvant treatment.    PREVIOUS RADIATION THERAPY: No   PAST MEDICAL HISTORY:  Past Medical History:  Diagnosis Date  . Cataract   . Depression   . Hypoglycemia   . Hyponatremia    syncope with this and admitted twice due to this issue   . Osteopenia   . Rectal bleeding        PAST SURGICAL HISTORY: Past Surgical History:  Procedure Laterality Date  . ABDOMINAL HYSTERECTOMY    . APPENDECTOMY      . CHOLECYSTECTOMY    . EYE SURGERY    . SHOULDER SURGERY Left    Rotary Cuff Tear  . TUBAL LIGATION       FAMILY HISTORY:  Family History  Problem Relation Age of Onset  . Stomach cancer Sister   . Ovarian cancer Sister   . Cancer - Ovarian Sister   . Dementia Mother   . Uterine cancer Mother   . Endometrial cancer Mother   . Prostate cancer Brother   . Cancer - Prostate Brother   . Cancer Other   . Cancer Brother        lung/brain  . Breast cancer Daughter   . Alzheimer's disease Father   . Colon cancer Neg Hx   . Esophageal cancer Neg Hx   . Pancreatic cancer Neg Hx   . Rectal cancer Neg Hx   . Colon polyps Neg Hx      SOCIAL HISTORY:  reports that she has been smoking cigarettes.  She has been smoking about 1.50 packs per day. She has never used smokeless tobacco. She reports that she does not drink alcohol or use drugs.   ALLERGIES: Naproxen sodium [naproxen sodium]   MEDICATIONS:  Current Outpatient Medications  Medication Sig Dispense Refill  . acetaminophen (TYLENOL) 325 MG tablet Take 650 mg every 6 (six) hours as needed by mouth.     Current Facility-Administered Medications  Medication  Dose Route Frequency Provider Last Rate Last Dose  . 0.9 %  sodium chloride infusion  500 mL Intravenous Once Pyrtle, Lajuan Lines, MD         REVIEW OF SYSTEMS: On review of systems, the patient reports that she is doing well overall. She reports previously having bleeding with every bowel movements, but at this time has less frequent episodes. She has been able to pass stool without softeners or  Laxatives. She denies any chest pain, shortness of breath, cough, fevers, chills, night sweats, unintended weight changes. She denies any bladder disturbances, and denies abdominal pain, nausea or vomiting. She denies any new musculoskeletal or joint aches or pains. A complete review of systems is obtained and is otherwise negative.     PHYSICAL EXAM:  Wt Readings from Last 3  Encounters:  03/22/18 118 lb 9.6 oz (53.8 kg)  03/22/18 118 lb 9.6 oz (53.8 kg)  03/22/18 119 lb 4.8 oz (54.1 kg)   Temp Readings from Last 3 Encounters:  03/22/18 97.8 F (36.6 C) (Oral)  03/22/18 97.8 F (36.6 C) (Oral)  03/22/18 97.6 F (36.4 C) (Oral)   BP Readings from Last 3 Encounters:  03/22/18 133/86  03/22/18 133/86  03/22/18 140/87   Pulse Readings from Last 3 Encounters:  03/22/18 72  03/22/18 72  03/22/18 76    In general this is a well appearing caucasian female in no acute distress. She is alert and oriented x4 and appropriate throughout the examination. HEENT reveals that the patient is normocephalic, atraumatic. EOMs are intact. Skin is intact without any evidence of gross lesions. Cardiopulmonary assessment is negative for acute distress and she exhibits normal effort. The patient refuses further exam.    ECOG = 1  0 - Asymptomatic (Fully active, able to carry on all predisease activities without restriction)  1 - Symptomatic but completely ambulatory (Restricted in physically strenuous activity but ambulatory and able to carry out work of a light or sedentary nature. For example, light housework, office work)  2 - Symptomatic, <50% in bed during the day (Ambulatory and capable of all self care but unable to carry out any work activities. Up and about more than 50% of waking hours)  3 - Symptomatic, >50% in bed, but not bedbound (Capable of only limited self-care, confined to bed or chair 50% or more of waking hours)  4 - Bedbound (Completely disabled. Cannot carry on any self-care. Totally confined to bed or chair)  5 - Death   Eustace Pen MM, Creech RH, Tormey DC, et al. [phone]). "Toxicity and response criteria of the [facility]". [place] Oncol. 5 (6): 649-55    LABORATORY DATA:  Lab Results  Component Value Date   WBC 13.0 (H) 03/22/2018   HGB 13.8 03/22/2018   HCT 41.0 03/22/2018   MCV 90.1 03/22/2018   PLT 392 03/22/2018    Lab Results  Component Value Date   NA 133 (L) 03/22/2018   K 4.5 03/22/2018   CL 98 03/22/2018   CO2 28 03/22/2018   Lab Results  Component Value Date   ALT 11 03/22/2018   AST 17 03/22/2018   ALKPHOS 107 03/22/2018   BILITOT 0.2 03/22/2018      RADIOGRAPHY: Ct Chest W Contrast  Result Date: 03/03/2018 CLINICAL DATA:  Rectal mass on colonoscopy today, staging workup EXAM: CT CHEST, ABDOMEN, AND PELVIS WITH CONTRAST TECHNIQUE: Multidetector CT imaging of the chest, abdomen and pelvis was performed following the standard protocol during bolus  administration of intravenous contrast. CONTRAST:  144m ISOVUE-300 IOPAMIDOL (ISOVUE-300) INJECTION 61% COMPARISON:  01/09/2012 FINDINGS: CT CHEST FINDINGS Cardiovascular: Aortic arch and branch vessel atherosclerotic calcification. Mediastinum/Nodes: Prevascular node 0.5 cm in short axis, image 18/2. Precarinal node 0.9 cm in short axis, image 21/2. Right hilar lymph node 1.2 cm in short axis, image 25/2. Right infrahilar lymph node 0.7 cm in short axis, image 29/2. Lungs/Pleura: Biapical pleuroparenchymal scarring. Centrilobular emphysema. 0.6 by 0.3 cm lingular nodule on image 83/3, this vertical level was not included on prior abdomen CTs. Musculoskeletal: Unremarkable CT ABDOMEN PELVIS FINDINGS Hepatobiliary: Cholecystectomy. No findings of metastatic disease to the liver. Pancreas: Unremarkable Spleen: Unremarkable Adrenals/Urinary Tract: Multiple small hypodense lesions of the kidneys are technically too small to characterize although statistically likely to be benign. Adrenal glands normal. Stomach/Bowel: A notably enhancing mass extending along a 6 cm segment of the rectum is present and significantly narrows the rectal lumen. The mass is located approximately 4.9 cm from the anorectal junction. Surrounding presacral/perirectal soft tissue nodules are present including a 6 mm nodule on image 77/6, a 6 mm node on image 76, a 6 mm presacral node on  image 80, and a 7 mm node on image 86. Additional is similar sized and smaller nodes are present. The mass does not definitively contact the sacrum on today's exam. Vascular/Lymphatic: Aortoiliac atherosclerotic vascular disease. Aside from the perirectal/presacral nodes, no additional abnormal lymph nodes are identified. Reproductive: Uterus absent.  Adnexa unremarkable. Other: Unremarkable Musculoskeletal: Unremarkable IMPRESSION: 1. Large rectal mass, approximately 6 cm in length, located about 4.9 cm from the anorectal junction, with multiple surrounding perirectal/presacral lymph nodes in the 6-7 mm range likely representing local nodal metastatic spread. Although there is a mildly enlarged right hilar lymph node, I am skeptical that this would represent a distal metastatic lesion, and most likely this hilar node enlargement is incidental. 2. 6 by 3 mm lingular nodule, this vertical level has not been captured on prior CT exams. Fleischner guidelines for follow up do not apply since the patient has a known malignancy. Although likely benign, this may warrant surveillance. 3. Other imaging findings of potential clinical significance: Aortic Atherosclerosis (ICD10-I70.0) and Emphysema (ICD10-J43.9). Biapical pleuroparenchymal scarring. Hypodense renal lesions are likely cysts but technically too small to characterize. Electronically Signed   By: WVan ClinesM.D.   On: 03/03/2018 13:32   Mr Pelvis W Wo Contrast  Result Date: 03/13/2018 CLINICAL DATA:  Recently diagnosed rectal carcinoma.  Staging. EXAM: MRI PELVIS WITHOUT AND WITH CONTRAST TECHNIQUE: Multiplanar multisequence MR imaging of the pelvis was performed both before and after administration of intravenous contrast. CONTRAST:  1332mMULTIHANCE GADOBENATE DIMEGLUMINE 529 MG/ML IV SOLN COMPARISON:  CT on 03/03/2018 FINDINGS: TUMOR LOCATION Location from Anal Verge: Middle third Shortest Distance from Tumor to Anal Sphincter: 6.6 cm TUMOR  DESCRIPTION Circumferential Extent: 100% Tumor Length: 5.2 cm T - CATEGORY Extension through Muscularis Propria: T3c; seen posteriorly and inferiorly measuring 10 mm on images 18/2 and 22/8 Shortest Distance of any tumor/node from Mesorectal Fascia: 0 mm; 5 mm lymph node abuts the left posterior mesorectal fascia on image 23/8. Extramural Vascular Invasion/Tumor Thrombus:  No Invasion of Anterior Peritoneal Reflection:  No Involvement of Adjacent Organs or Pelvic Sidewall: No Levator Ani Involvement:  No N - CATEGORY Mesorectal Lymph Nodes >=32m43m4 or more=N2; largest measures 8 mm on image 23/8 Extra-mesorectal Lymphadenopathy:  No Other: Previous hysterectomy. No adnexal pelvic mass or free fluid. IMPRESSION: Rectal adenocarcinoma, stage T3c, N2, MX by  imaging. Electronically Signed   By: Earle Gell M.D.   On: 03/13/2018 08:19   Ct Abdomen Pelvis W Contrast  Result Date: 03/03/2018 CLINICAL DATA:  Rectal mass on colonoscopy today, staging workup EXAM: CT CHEST, ABDOMEN, AND PELVIS WITH CONTRAST TECHNIQUE: Multidetector CT imaging of the chest, abdomen and pelvis was performed following the standard protocol during bolus administration of intravenous contrast. CONTRAST:  160m ISOVUE-300 IOPAMIDOL (ISOVUE-300) INJECTION 61% COMPARISON:  01/09/2012 FINDINGS: CT CHEST FINDINGS Cardiovascular: Aortic arch and branch vessel atherosclerotic calcification. Mediastinum/Nodes: Prevascular node 0.5 cm in short axis, image 18/2. Precarinal node 0.9 cm in short axis, image 21/2. Right hilar lymph node 1.2 cm in short axis, image 25/2. Right infrahilar lymph node 0.7 cm in short axis, image 29/2. Lungs/Pleura: Biapical pleuroparenchymal scarring. Centrilobular emphysema. 0.6 by 0.3 cm lingular nodule on image 83/3, this vertical level was not included on prior abdomen CTs. Musculoskeletal: Unremarkable CT ABDOMEN PELVIS FINDINGS Hepatobiliary: Cholecystectomy. No findings of metastatic disease to the liver. Pancreas:  Unremarkable Spleen: Unremarkable Adrenals/Urinary Tract: Multiple small hypodense lesions of the kidneys are technically too small to characterize although statistically likely to be benign. Adrenal glands normal. Stomach/Bowel: A notably enhancing mass extending along a 6 cm segment of the rectum is present and significantly narrows the rectal lumen. The mass is located approximately 4.9 cm from the anorectal junction. Surrounding presacral/perirectal soft tissue nodules are present including a 6 mm nodule on image 77/6, a 6 mm node on image 76, a 6 mm presacral node on image 80, and a 7 mm node on image 86. Additional is similar sized and smaller nodes are present. The mass does not definitively contact the sacrum on today's exam. Vascular/Lymphatic: Aortoiliac atherosclerotic vascular disease. Aside from the perirectal/presacral nodes, no additional abnormal lymph nodes are identified. Reproductive: Uterus absent.  Adnexa unremarkable. Other: Unremarkable Musculoskeletal: Unremarkable IMPRESSION: 1. Large rectal mass, approximately 6 cm in length, located about 4.9 cm from the anorectal junction, with multiple surrounding perirectal/presacral lymph nodes in the 6-7 mm range likely representing local nodal metastatic spread. Although there is a mildly enlarged right hilar lymph node, I am skeptical that this would represent a distal metastatic lesion, and most likely this hilar node enlargement is incidental. 2. 6 by 3 mm lingular nodule, this vertical level has not been captured on prior CT exams. Fleischner guidelines for follow up do not apply since the patient has a known malignancy. Although likely benign, this may warrant surveillance. 3. Other imaging findings of potential clinical significance: Aortic Atherosclerosis (ICD10-I70.0) and Emphysema (ICD10-J43.9). Biapical pleuroparenchymal scarring. Hypodense renal lesions are likely cysts but technically too small to characterize. Electronically Signed   By:  WVan ClinesM.D.   On: 03/03/2018 13:32       IMPRESSION/PLAN: 1. At least cT3cN2 adenocarcinoma of the distal rectum. Dr. MLisbeth Renshawdiscusses the pathology findings and reviews the nature of  locally advanced rectal cancer. Her case has been discussed in GI conference on several occasions and she has now met with medical oncology and colorectal surgery. She is counseled on the rationale to consider neoadjuvant chemoRT followed by surgical resection by Dr. WDema Severin We discussed the risks, benefits, short, and long term effects of radiotherapy, and the patient is interested in proceeding. Dr. MLisbeth Renshawdiscusses the delivery and logistics of radiotherapy and anticipates a course of 5 1/2 weeks of radiotherapy. Written consent is obtained and placed in the chart, a copy was provided to the patient. She will be contacted by our staff to  coordinate simulation. We anticipate starting her treatment the week of 04/03/18. 2. Possible genetic predisposition to malignancy. The patient is a candidate for genetic testing given her personal and family history. her was offered referral and declines. Her sister who was with her may consider this as well.   The above documentation reflects my direct findings during this shared patient visit. Please see the separate note by Dr. Lisbeth Renshaw on this date for the remainder of the patient's plan of care.    Carola Rhine, PAC

## 2018-03-23 ENCOUNTER — Encounter: Payer: Self-pay | Admitting: General Practice

## 2018-03-23 NOTE — Progress Notes (Signed)
[place] Psychosocial Distress Screening Clinical Social Work  Clinical Social Work was referred by distress screening protocol.  The patient scored a 10 on the Psychosocial Distress Thermometer which indicates severe distress. Clinical Social Worker Edwyna Shell to assess for distress and other psychosocial needs. Attempted to reach patient at numbers provided - no answer and no VM.  Will attempt to speak w patient at next scheduled [place] visit on 5/29.  ONCBCN DISTRESS SCREENING 03/22/2018  Screening Type Initial Screening  Distress experienced in past week (1-10) 10  Family Problem type Other (comment)    Clinical Social Worker follow up needed: Yes.    If yes, follow up plan:  Recontact a next clinic visit, unable to leave VM  Edwyna Shell, [place] Worker Phone:  [phone]

## 2018-03-24 ENCOUNTER — Telehealth: Payer: Self-pay | Admitting: General Practice

## 2018-03-24 ENCOUNTER — Encounter: Payer: Self-pay | Admitting: General Practice

## 2018-03-24 ENCOUNTER — Other Ambulatory Visit: Payer: Self-pay

## 2018-03-24 DIAGNOSIS — C20 Malignant neoplasm of rectum: Secondary | ICD-10-CM

## 2018-03-24 MED ORDER — PROCHLORPERAZINE MALEATE 10 MG PO TABS: 10.0000 mg | ORAL_TABLET | Freq: Four times a day (QID) | ORAL | 0 refills | Status: DC | PRN

## 2018-03-24 NOTE — Progress Notes (Signed)
[place] Psychosocial Distress Screening Clinical Social Work  Clinical Social Work was referred by distress screening protocol.  The patient scored a 10 on the Psychosocial Distress Thermometer which indicates severe distress. Clinical Social Worker Edwyna Shell to assess for distress and other psychosocial needs. CSW and patient discussed common feeling and emotions when being diagnosed with cancer, and the importance of support during treatment. CSW informed patient of the support team and support services at [facility]. CSW provided contact information and encouraged patient to call with any questions or concerns.  Patient was quite frustrated and overwhelmed w number of medical appointments scheduled for her in the diagnosis phase, felt overwhelmed as she normally does not like to go to doctors.  "I knew I had cancer a year ago, but I didn't want to go to the doctor."  Lives on fixed income, concerned about affording gas money to get to treatment.  Also voiced concerns about past due bills - will refer to community agencies for help.  Encouraged patient to stop at [facility] on future visits to discuss needs/concerns.    ONCBCN DISTRESS SCREENING 03/22/2018  Screening Type Initial Screening  Distress experienced in past week (1-10) 10  Family Problem type Other (comment)    Clinical Social Worker follow up needed: Yes.    If yes, follow up plan:  Refer to [facility] for help w needs in community including groceries, utility payments and other needs.  Limited fixed income.  Financial Advocate is working on Geologist, engineering for patient - CSW also recommends linkages w other transport options including [facility] and similar   Edwyna Shell, [place] Worker Phone:  [phone]

## 2018-03-24 NOTE — Telephone Encounter (Signed)
[place] CSW Progress Note  Patient states her cell phone [phone]) does not work well, asks that she be contacted on her home phone [phone]).  Edwyna Shell, LCSW Clinical Social Worker Phone:  [phone]

## 2018-03-28 ENCOUNTER — Encounter: Payer: Self-pay | Admitting: Medical Oncology

## 2018-03-28 ENCOUNTER — Telehealth: Payer: Self-pay | Admitting: Pharmacist

## 2018-03-28 DIAGNOSIS — C20 Malignant neoplasm of rectum: Secondary | ICD-10-CM

## 2018-03-28 MED ORDER — CAPECITABINE 500 MG PO TABS: ORAL_TABLET | ORAL | 0 refills | Status: DC

## 2018-03-28 MED FILL — CAPECITABINE 500 MG TABLET: 500 | 30 days supply | Qty: 110 | Fill #0

## 2018-03-28 NOTE — Telephone Encounter (Signed)
Oral Oncology Pharmacist Encounter  Prescription for Xeloda 500mg  tablets, take 3 tablets (1500mg ) by mouth in AM and 2 tablets (1000mg ) in PM, within 30 minutes of finishing food. Take on days of radiation only, M-F E-scribed to [facility] #150 (5 tabs / day x 30 treatment days)  Copayment $0 Patient will be dispensed #110 tablets for the 1st 22 treatment days (30 calendar day supply) The pharmacy will follow-up with patient in ~3 weeks to dispense remaining #40 tablets for remaining treatment days  Patient plans to pick-up her Xeloda tomorrow (03/29/18)  Xeloda will start with radiation, not yet scheduled CT simulation scheduled for 03/29/18  Oral Oncology Clinic will continue to follow.  Johny Drilling, PharmD, BCPS, BCOP  03/28/2018 2:06 PM Oral Oncology Clinic [phone]

## 2018-03-29 ENCOUNTER — Inpatient Hospital Stay: Payer: Medicare Other

## 2018-03-29 ENCOUNTER — Encounter: Payer: Self-pay | Admitting: General Practice

## 2018-03-29 ENCOUNTER — Ambulatory Visit (INDEPENDENT_AMBULATORY_CARE_PROVIDER_SITE_OTHER): Payer: Medicare Other | Admitting: Internal Medicine

## 2018-03-29 ENCOUNTER — Other Ambulatory Visit: Payer: Self-pay | Admitting: Medical Oncology

## 2018-03-29 ENCOUNTER — Other Ambulatory Visit: Payer: Self-pay

## 2018-03-29 ENCOUNTER — Ambulatory Visit
Admission: RE | Admit: 2018-03-29 | Discharge: 2018-03-29 | Disposition: A | Discharge status: Home / Self Care | Payer: Medicare Other | Source: Ambulatory Visit | Attending: Radiation Oncology | Admitting: Radiation Oncology

## 2018-03-29 VITALS — BP 122/57 | HR 72 | Temp 98.4°F | Ht 61.0 in | Wt 118.5 lb

## 2018-03-29 DIAGNOSIS — C20 Malignant neoplasm of rectum: Secondary | ICD-10-CM | POA: Diagnosis not present

## 2018-03-29 DIAGNOSIS — R911 Solitary pulmonary nodule: Secondary | ICD-10-CM | POA: Diagnosis not present

## 2018-03-29 DIAGNOSIS — Z800 Family history of malignant neoplasm of digestive organs: Secondary | ICD-10-CM | POA: Diagnosis not present

## 2018-03-29 DIAGNOSIS — Z801 Family history of malignant neoplasm of trachea, bronchus and lung: Secondary | ICD-10-CM | POA: Diagnosis not present

## 2018-03-29 DIAGNOSIS — Z803 Family history of malignant neoplasm of breast: Secondary | ICD-10-CM | POA: Diagnosis not present

## 2018-03-29 DIAGNOSIS — J439 Emphysema, unspecified: Secondary | ICD-10-CM | POA: Diagnosis not present

## 2018-03-29 DIAGNOSIS — Z8042 Family history of malignant neoplasm of prostate: Secondary | ICD-10-CM | POA: Diagnosis not present

## 2018-03-29 DIAGNOSIS — Z510 Encounter for antineoplastic radiation therapy: Secondary | ICD-10-CM | POA: Diagnosis not present

## 2018-03-29 DIAGNOSIS — M79674 Pain in right toe(s): Secondary | ICD-10-CM | POA: Diagnosis not present

## 2018-03-29 DIAGNOSIS — M8580 Other specified disorders of bone density and structure, unspecified site: Secondary | ICD-10-CM | POA: Diagnosis not present

## 2018-03-29 DIAGNOSIS — Z808 Family history of malignant neoplasm of other organs or systems: Secondary | ICD-10-CM | POA: Diagnosis not present

## 2018-03-29 LAB — CBC WITH DIFFERENTIAL (CANCER CENTER ONLY)
BASOS PCT: 1 %
Basophils Absolute: 0.1 10*3/uL (ref 0.0–0.1)
EOS PCT: 2 %
Eosinophils Absolute: 0.2 10*3/uL (ref 0.0–0.5)
HCT: 38.5 % (ref 34.8–46.6)
Hemoglobin: 13.1 g/dL (ref 11.6–15.9)
LYMPHS ABS: 3.7 10*3/uL — AB (ref 0.9–3.3)
Lymphocytes Relative: 29 %
MCH: 30.3 pg (ref 25.1–34.0)
MCHC: 34 g/dL (ref 31.5–36.0)
MCV: 89.1 fL (ref 79.5–101.0)
MONOS PCT: 8 %
Monocytes Absolute: 1 10*3/uL — ABNORMAL HIGH (ref 0.1–0.9)
Neutro Abs: 7.6 10*3/uL — ABNORMAL HIGH (ref 1.5–6.5)
Neutrophils Relative %: 60 %
PLATELETS: 418 10*3/uL — AB (ref 145–400)
RBC: 4.32 MIL/uL (ref 3.70–5.45)
RDW: 13.9 % (ref 11.2–14.5)
WBC Count: 12.4 10*3/uL — ABNORMAL HIGH (ref 3.9–10.3)

## 2018-03-29 LAB — RESEARCH LABS

## 2018-03-29 NOTE — Progress Notes (Signed)
   CC: Rectal Adenocarcinoma follow up  HPI:  [name] is a 67 y.o. female with a past medical history listed below here today for follow up of her recently diagnosed rectal adenocarcinomoa.  For details of today's visit and the status of her chronic medical issues please refer to the assessment and plan.   Past Medical History:  Diagnosis Date  . Cataract   . Depression   . Hypoglycemia   . Hyponatremia    syncope with this and admitted twice due to this issue   . Osteopenia   . Rectal bleeding    Review of Systems:  No chest pain or shortness of breath  Physical Exam:  Vitals:   03/29/18 1313  BP: (!) 122/57  Pulse: 72  Temp: 98.4 F (36.9 C)  TempSrc: Oral  SpO2: 97%  Weight: 118 lb 8 oz (53.8 kg)  Height: 5\' 1"  (1.549 m)   GENERAL- alert, co-operative, appears as stated age, not in any distress. CARDIAC- RRR, no murmurs, rubs or gallops. RESP- Moving equal volumes of air, and clear to auscultation bilaterally, no wheezes or crackles. ABDOMEN- Soft, nontender, bowel sounds present. NEURO- No obvious Cr N abnormality. EXTREMITIES- pulse 2+, symmetric, no pedal edema. SKIN- Warm, dry, No rash or lesion. PSYCH- Normal mood and affect, appropriate thought content and speech.   Assessment & Plan:   See Encounters Tab for problem based charting.  Patient discussed with Dr. Beryle Beams

## 2018-03-29 NOTE — Patient Instructions (Signed)
Deborah Arroyo luck with everything. Please come back to see [place] in 3-4 months for follow up and let us know if you need anything in the meantime.

## 2018-03-29 NOTE — Progress Notes (Signed)
[place] CSW Progress Note  Met w patient in office, patient pleased w resources provided by [employer].  Feels she will be able to get to treatment w this assistance.  Reviewed [facility] information so that patient can submit her application for additional financial assistance.  Edwyna Shell, LCSW Clinical Social Worker Phone:  [phone]

## 2018-03-29 NOTE — Progress Notes (Signed)
[place] CSW Progress Note  Patient was referred to [employer] [facility] [place] and [employer] for help w transportation, [employer] and food assistance.  Patient could not respond to consent required via text, does not have email.  Unable to provide consent to release referrals.  CSW spoke w patient by phone - patient wants to use [place] to pay for back bills, states she will get gas cards which will allow her to drive herself to treatments.  Referrals recalled w patient consent.  Edwyna Shell, LCSW Clinical Social Worker Phone:  [phone]

## 2018-03-29 NOTE — Assessment & Plan Note (Addendum)
Deborah Arroyo underwent colonoscopy on 03/01/2018 which demonstrated a large partially obstructing mass in the proximal rectum and in the rectosigmoid colon.  Biopsies were obtained and pathology revealed adenocarcinoma.  She followed up with oncology, Dr. Learta Codding, on 03/06/2018 for staging.  Staging CT scans on 03/03/2018 demonstrated small mediastinal/hilar lymph nodes, indeterminate lingula nodule, rectal mass, small perirectal lymph nodes.  MRI of the pelvis done 03/11/2018 showed staging at [place].  Tumor was noted to extend through the muscularis propria and there were multiple suspicious mesorectal lymph nodes.  She initially was resistant to treatment however after discussions with oncology and radiation oncology she has decided to pursue therapies.  She is to start neoadjuvant chemotherapy radiation.  She is scheduled to begincapecitabine/radiation on 04/03/2018.  Follow-up with oncology is scheduled for 04/14/2018.

## 2018-03-30 ENCOUNTER — Encounter: Payer: Self-pay | Admitting: Radiation Oncology

## 2018-03-30 LAB — URIC ACID: URIC ACID: 3.4 mg/dL (ref 2.5–7.1)

## 2018-03-30 NOTE — Progress Notes (Signed)
  Radiation Oncology         [phone] ________________________________  Name: CHALEE HIROTA MRN: [id number]  Date: 03/29/2018  DOB: 05/09/1951  Optical Surface Tracking Plan:  Since intensity modulated radiotherapy (IMRT) and 3D conformal radiation treatment methods are predicated on accurate and precise positioning for treatment, intrafraction motion monitoring is medically necessary to ensure accurate and safe treatment delivery.  The ability to quantify intrafraction motion without excessive ionizing radiation dose can only be performed with optical surface tracking. Accordingly, surface imaging offers the opportunity to obtain 3D measurements of patient position throughout IMRT and 3D treatments without excessive radiation exposure.  I am ordering optical surface tracking for this patient's upcoming course of radiotherapy. ________________________________  Kyung Rudd, MD 03/30/2018 7:38 AM    Reference:   Particia Jasper, et al. Surface imaging-based analysis of intrafraction motion for breast radiotherapy patients.Journal of [place], n. 6, nov. 2014. ISSN [id number].   Available at: <http://www.jacmp.org/index.php/jacmp/article/view/4957>.

## 2018-03-30 NOTE — Progress Notes (Signed)
  Radiation Oncology         (336) [phone] ________________________________  Name: Deborah Arroyo MRN: [id number]  Date: 03/29/2018  DOB: 12/08/1950   SIMULATION AND TREATMENT PLANNING NOTE  DIAGNOSIS:     ICD-10-CM   1. Rectal cancer ([place]) C20      The patient presented for simulation for the patient's upcoming course of radiation for the diagnosis of rectal cancer. The patient was placed in a supine position. A customized vac-lock bag was constructed to aid in patient immobilization on. This complex treatment device will be used on a daily basis during the treatment. In this fashion a CT scan was obtained through the pelvic region and the isocenter was placed near midline within the pelvis. Surface markings were placed.  The patient's imaging was loaded into the radiation treatment planning system. The patient will initially be planned to receive a course of radiation to a dose of 45 Gy. This will be accomplished in 25 fractions at 1.8 gray per fraction. This initial treatment will correspond to a 3-D conformal technique. The target has been contoured in addition to the rectum, bladder and femoral heads. Dose volume histograms of each of these structures have been requested and these will be carefully reviewed as part of the 3-D conformal treatment planning process. To accomplish this initial treatment, 4 customized blocks have been designed for this purpose. Each of these 4 complex treatment devices will be used on a daily basis during the initial course of the treatment. It is anticipated that the patient will then receive a boost for an additional 5.4 Gy. The anticipated total dose therefore will be 50.4 Gy.    Special treatment procedure The patient will receive chemotherapy during the course of radiation treatment. The patient may experience increased or overlapping toxicity due to this combined-modality approach and the patient will be monitored for such problems. This may include extra  lab work as necessary. This therefore constitutes a special treatment procedure.    ________________________________  Jodelle Gross, MD, PhD

## 2018-03-30 NOTE — Progress Notes (Signed)
I met with Deborah Arroyo yesterday afternoon.  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 I also gave her a [facility] form to call and apply.

## 2018-03-31 ENCOUNTER — Ambulatory Visit: Payer: Medicare Other

## 2018-03-31 DIAGNOSIS — Z510 Encounter for antineoplastic radiation therapy: Secondary | ICD-10-CM | POA: Diagnosis not present

## 2018-03-31 DIAGNOSIS — C20 Malignant neoplasm of rectum: Secondary | ICD-10-CM | POA: Diagnosis not present

## 2018-04-02 DIAGNOSIS — M79674 Pain in right toe(s): Secondary | ICD-10-CM | POA: Insufficient documentation

## 2018-04-02 NOTE — Assessment & Plan Note (Signed)
Patient with complaints of periodic right great toe pain.  She reports that she will periodically experience swelling and mild erythema of the proximal aspect of her right great toe at the first MTP joint. She reports this is not correlated with wearing tight shoes, exercise, and denies any signs of infection including fevers/chills when this occurs.  She denies any symptoms today and exam is benign with exception of some mild tenderness over the first MTP joint of the right foot.  She denies any history of gout.  We will check uric acid level today.  This is within normal limits.  Continue to treat conservatively with heat/ice and anti-inflammatories.

## 2018-04-03 ENCOUNTER — Ambulatory Visit
Admission: RE | Admit: 2018-04-03 | Discharge: 2018-04-03 | Disposition: A | Discharge status: Home / Self Care | Payer: Medicare Other | Source: Ambulatory Visit | Attending: Radiation Oncology | Admitting: Radiation Oncology

## 2018-04-03 DIAGNOSIS — Z720 Tobacco use: Secondary | ICD-10-CM | POA: Insufficient documentation

## 2018-04-03 DIAGNOSIS — Z809 Family history of malignant neoplasm, unspecified: Secondary | ICD-10-CM | POA: Insufficient documentation

## 2018-04-03 DIAGNOSIS — J449 Chronic obstructive pulmonary disease, unspecified: Secondary | ICD-10-CM | POA: Insufficient documentation

## 2018-04-03 DIAGNOSIS — Z510 Encounter for antineoplastic radiation therapy: Secondary | ICD-10-CM | POA: Insufficient documentation

## 2018-04-03 DIAGNOSIS — C20 Malignant neoplasm of rectum: Secondary | ICD-10-CM | POA: Insufficient documentation

## 2018-04-03 NOTE — Progress Notes (Signed)
Medicine attending: Medical history, presenting problems, physical findings, and medications, reviewed with resident physician Dr Maryellen Pile on the day of the patient visit and I concur with his evaluation and management plan. Newly diagnosed rectal ca; low lesion; local nodal spread by imaging criteria; already evaluated by Med & Rad Onc; plan for neoadjuvant chemo followed by concomitant chemo/RT then re-evaluate whether she is a surgical candidate for resection of primary tumor. No obvious distal mets.

## 2018-04-04 ENCOUNTER — Telehealth: Payer: Self-pay | Admitting: Internal Medicine

## 2018-04-04 ENCOUNTER — Ambulatory Visit
Admission: RE | Admit: 2018-04-04 | Discharge: 2018-04-04 | Disposition: A | Discharge status: Home / Self Care | Payer: Medicare Other | Source: Ambulatory Visit | Attending: Radiation Oncology | Admitting: Radiation Oncology

## 2018-04-04 DIAGNOSIS — C20 Malignant neoplasm of rectum: Secondary | ICD-10-CM | POA: Diagnosis not present

## 2018-04-04 DIAGNOSIS — Z809 Family history of malignant neoplasm, unspecified: Secondary | ICD-10-CM | POA: Diagnosis not present

## 2018-04-04 DIAGNOSIS — Z720 Tobacco use: Secondary | ICD-10-CM | POA: Diagnosis not present

## 2018-04-04 DIAGNOSIS — J449 Chronic obstructive pulmonary disease, unspecified: Secondary | ICD-10-CM | POA: Diagnosis not present

## 2018-04-04 DIAGNOSIS — Z510 Encounter for antineoplastic radiation therapy: Secondary | ICD-10-CM | POA: Diagnosis not present

## 2018-04-04 NOTE — Telephone Encounter (Signed)
Called and discussed results with patient.

## 2018-04-04 NOTE — Progress Notes (Signed)
  Oncology Nurse Navigator Documentation  Navigator Location: [facility] (04/04/18 1548) Referral date to RadOnc/MedOnc: 03/22/18 (04/04/18 1548) )Navigator Encounter Type: Telephone (04/04/18 1548) Telephone: Lahoma Crocker Call(Left message/VM offering support) (04/04/18 1548) Abnormal Finding Date: 03/01/18 (04/04/18 1550) Confirmed Diagnosis Date: 03/01/18 (04/04/18 1550)             Treatment Initiated Date: 04/03/18 (04/04/18 1550) Patient Visit Type: MedOnc;RadOnc (04/04/18 1550) Treatment Phase: First Chemo Tx;First Radiation Tx (04/04/18 1550)     Interventions: Psycho-social support (04/04/18 1550)            Acuity: Level 2 (04/04/18 1550)   Acuity Level 2: Ongoing guidance and education throughout treatment as needed (04/04/18 1550)     Time Spent with Patient: 15 (04/04/18 1550)

## 2018-04-04 NOTE — Telephone Encounter (Signed)
Patient is calling about labs results, pt has to leave at 1pm for radiation and wont return until after 4pm

## 2018-04-05 ENCOUNTER — Ambulatory Visit
Admission: RE | Admit: 2018-04-05 | Discharge: 2018-04-05 | Disposition: A | Discharge status: Home / Self Care | Payer: Medicare Other | Source: Ambulatory Visit | Attending: Radiation Oncology | Admitting: Radiation Oncology

## 2018-04-05 DIAGNOSIS — J449 Chronic obstructive pulmonary disease, unspecified: Secondary | ICD-10-CM | POA: Diagnosis not present

## 2018-04-05 DIAGNOSIS — C20 Malignant neoplasm of rectum: Secondary | ICD-10-CM | POA: Diagnosis not present

## 2018-04-05 DIAGNOSIS — Z510 Encounter for antineoplastic radiation therapy: Secondary | ICD-10-CM | POA: Diagnosis not present

## 2018-04-05 DIAGNOSIS — Z720 Tobacco use: Secondary | ICD-10-CM | POA: Diagnosis not present

## 2018-04-05 DIAGNOSIS — Z809 Family history of malignant neoplasm, unspecified: Secondary | ICD-10-CM | POA: Diagnosis not present

## 2018-04-06 ENCOUNTER — Ambulatory Visit
Admission: RE | Admit: 2018-04-06 | Discharge: 2018-04-06 | Disposition: A | Discharge status: Home / Self Care | Payer: Medicare Other | Source: Ambulatory Visit | Attending: Radiation Oncology | Admitting: Radiation Oncology

## 2018-04-06 DIAGNOSIS — J449 Chronic obstructive pulmonary disease, unspecified: Secondary | ICD-10-CM | POA: Diagnosis not present

## 2018-04-06 DIAGNOSIS — Z510 Encounter for antineoplastic radiation therapy: Secondary | ICD-10-CM | POA: Diagnosis not present

## 2018-04-06 DIAGNOSIS — Z720 Tobacco use: Secondary | ICD-10-CM | POA: Diagnosis not present

## 2018-04-06 DIAGNOSIS — C20 Malignant neoplasm of rectum: Secondary | ICD-10-CM | POA: Diagnosis not present

## 2018-04-06 DIAGNOSIS — Z809 Family history of malignant neoplasm, unspecified: Secondary | ICD-10-CM | POA: Diagnosis not present

## 2018-04-07 ENCOUNTER — Ambulatory Visit
Admission: RE | Admit: 2018-04-07 | Discharge: 2018-04-07 | Disposition: A | Discharge status: Home / Self Care | Payer: Medicare Other | Source: Ambulatory Visit | Attending: Radiation Oncology | Admitting: Radiation Oncology

## 2018-04-07 ENCOUNTER — Telehealth: Payer: Self-pay | Admitting: *Deleted

## 2018-04-07 DIAGNOSIS — Z720 Tobacco use: Secondary | ICD-10-CM | POA: Diagnosis not present

## 2018-04-07 DIAGNOSIS — J449 Chronic obstructive pulmonary disease, unspecified: Secondary | ICD-10-CM | POA: Diagnosis not present

## 2018-04-07 DIAGNOSIS — C20 Malignant neoplasm of rectum: Secondary | ICD-10-CM | POA: Diagnosis not present

## 2018-04-07 DIAGNOSIS — Z510 Encounter for antineoplastic radiation therapy: Secondary | ICD-10-CM | POA: Diagnosis not present

## 2018-04-07 DIAGNOSIS — Z809 Family history of malignant neoplasm, unspecified: Secondary | ICD-10-CM | POA: Diagnosis not present

## 2018-04-07 NOTE — Telephone Encounter (Signed)
Call from pt -- states she had talked to Dr Charlynn Grimes about quit smoking and starting nicotine patches. She would like to start the patches; and send rx to [facility].

## 2018-04-07 NOTE — Progress Notes (Signed)
Pt here for patient teaching.  Pt given Radiation and You booklet.  Reviewed areas of pertinence such as diarrhea, fatigue, hair loss, nausea and vomiting, sexual and fertility changes, skin changes and urinary and bladder changes . Pt able to give teach back of to pat skin, have Imodium on hand and sitz bath,avoid applying anything to skin within 4 hours of treatment. Pt verbalizes understanding of information given and will contact nursing with any questions or concerns.     Cori Razor, RN

## 2018-04-08 MED ORDER — NICOTINE 14 MG/24HR TD PT24: 14.0000 mg | MEDICATED_PATCH | Freq: Every day | TRANSDERMAL | 0 refills | Status: DC

## 2018-04-10 ENCOUNTER — Ambulatory Visit
Admission: RE | Admit: 2018-04-10 | Discharge: 2018-04-10 | Disposition: A | Discharge status: Home / Self Care | Payer: Medicare Other | Source: Ambulatory Visit | Attending: Radiation Oncology | Admitting: Radiation Oncology

## 2018-04-10 DIAGNOSIS — Z809 Family history of malignant neoplasm, unspecified: Secondary | ICD-10-CM | POA: Diagnosis not present

## 2018-04-10 DIAGNOSIS — C20 Malignant neoplasm of rectum: Secondary | ICD-10-CM | POA: Diagnosis not present

## 2018-04-10 DIAGNOSIS — Z510 Encounter for antineoplastic radiation therapy: Secondary | ICD-10-CM | POA: Diagnosis not present

## 2018-04-10 DIAGNOSIS — J449 Chronic obstructive pulmonary disease, unspecified: Secondary | ICD-10-CM | POA: Diagnosis not present

## 2018-04-10 DIAGNOSIS — Z720 Tobacco use: Secondary | ICD-10-CM | POA: Diagnosis not present

## 2018-04-10 NOTE — Telephone Encounter (Signed)
F/U call - stated she went to the pharmacy on Sat and rx was not there. Informed it was refilled on Sat; to call this am and if it's still not there, call me back.

## 2018-04-11 ENCOUNTER — Ambulatory Visit
Admission: RE | Admit: 2018-04-11 | Discharge: 2018-04-11 | Disposition: A | Discharge status: Home / Self Care | Payer: Medicare Other | Source: Ambulatory Visit | Attending: Radiation Oncology | Admitting: Radiation Oncology

## 2018-04-11 DIAGNOSIS — Z510 Encounter for antineoplastic radiation therapy: Secondary | ICD-10-CM | POA: Diagnosis not present

## 2018-04-11 DIAGNOSIS — C20 Malignant neoplasm of rectum: Secondary | ICD-10-CM | POA: Diagnosis not present

## 2018-04-11 DIAGNOSIS — Z809 Family history of malignant neoplasm, unspecified: Secondary | ICD-10-CM | POA: Diagnosis not present

## 2018-04-11 DIAGNOSIS — Z720 Tobacco use: Secondary | ICD-10-CM | POA: Diagnosis not present

## 2018-04-11 DIAGNOSIS — J449 Chronic obstructive pulmonary disease, unspecified: Secondary | ICD-10-CM | POA: Diagnosis not present

## 2018-04-12 ENCOUNTER — Ambulatory Visit
Admission: RE | Admit: 2018-04-12 | Discharge: 2018-04-12 | Disposition: A | Discharge status: Home / Self Care | Payer: Medicare Other | Source: Ambulatory Visit | Attending: Radiation Oncology | Admitting: Radiation Oncology

## 2018-04-12 DIAGNOSIS — Z720 Tobacco use: Secondary | ICD-10-CM | POA: Diagnosis not present

## 2018-04-12 DIAGNOSIS — C20 Malignant neoplasm of rectum: Secondary | ICD-10-CM | POA: Diagnosis not present

## 2018-04-12 DIAGNOSIS — J449 Chronic obstructive pulmonary disease, unspecified: Secondary | ICD-10-CM | POA: Diagnosis not present

## 2018-04-12 DIAGNOSIS — Z809 Family history of malignant neoplasm, unspecified: Secondary | ICD-10-CM | POA: Diagnosis not present

## 2018-04-12 DIAGNOSIS — Z510 Encounter for antineoplastic radiation therapy: Secondary | ICD-10-CM | POA: Diagnosis not present

## 2018-04-13 ENCOUNTER — Ambulatory Visit
Admission: RE | Admit: 2018-04-13 | Discharge: 2018-04-13 | Disposition: A | Discharge status: Home / Self Care | Payer: Medicare Other | Source: Ambulatory Visit | Attending: Radiation Oncology | Admitting: Radiation Oncology

## 2018-04-13 ENCOUNTER — Other Ambulatory Visit: Payer: Self-pay | Admitting: *Deleted

## 2018-04-13 DIAGNOSIS — C20 Malignant neoplasm of rectum: Secondary | ICD-10-CM

## 2018-04-13 DIAGNOSIS — Z720 Tobacco use: Secondary | ICD-10-CM | POA: Diagnosis not present

## 2018-04-13 DIAGNOSIS — Z510 Encounter for antineoplastic radiation therapy: Secondary | ICD-10-CM | POA: Diagnosis not present

## 2018-04-13 DIAGNOSIS — J449 Chronic obstructive pulmonary disease, unspecified: Secondary | ICD-10-CM | POA: Diagnosis not present

## 2018-04-13 DIAGNOSIS — Z809 Family history of malignant neoplasm, unspecified: Secondary | ICD-10-CM | POA: Diagnosis not present

## 2018-04-14 ENCOUNTER — Inpatient Hospital Stay: Payer: Medicare Other

## 2018-04-14 ENCOUNTER — Telehealth: Payer: Self-pay | Admitting: Nurse Practitioner

## 2018-04-14 ENCOUNTER — Ambulatory Visit
Admission: RE | Admit: 2018-04-14 | Discharge: 2018-04-14 | Disposition: A | Discharge status: Home / Self Care | Payer: Medicare Other | Source: Ambulatory Visit | Attending: Radiation Oncology | Admitting: Radiation Oncology

## 2018-04-14 ENCOUNTER — Encounter: Payer: Self-pay | Admitting: Nurse Practitioner

## 2018-04-14 ENCOUNTER — Inpatient Hospital Stay: Payer: Medicare Other | Attending: Oncology | Admitting: Nurse Practitioner

## 2018-04-14 VITALS — BP 101/74 | HR 73 | Temp 97.6°F | Resp 18 | Ht 61.0 in | Wt 120.1 lb

## 2018-04-14 DIAGNOSIS — Z923 Personal history of irradiation: Secondary | ICD-10-CM

## 2018-04-14 DIAGNOSIS — Z720 Tobacco use: Secondary | ICD-10-CM | POA: Diagnosis not present

## 2018-04-14 DIAGNOSIS — Z809 Family history of malignant neoplasm, unspecified: Secondary | ICD-10-CM | POA: Diagnosis not present

## 2018-04-14 DIAGNOSIS — J449 Chronic obstructive pulmonary disease, unspecified: Secondary | ICD-10-CM

## 2018-04-14 DIAGNOSIS — C20 Malignant neoplasm of rectum: Secondary | ICD-10-CM

## 2018-04-14 DIAGNOSIS — F17210 Nicotine dependence, cigarettes, uncomplicated: Secondary | ICD-10-CM

## 2018-04-14 DIAGNOSIS — Z510 Encounter for antineoplastic radiation therapy: Secondary | ICD-10-CM | POA: Diagnosis not present

## 2018-04-14 LAB — CBC WITH DIFFERENTIAL (CANCER CENTER ONLY)
Basophils Absolute: 0 10*3/uL (ref 0.0–0.1)
Basophils Relative: 0 %
EOS ABS: 0.2 10*3/uL (ref 0.0–0.5)
Eosinophils Relative: 3 %
HCT: 38.5 % (ref 34.8–46.6)
Hemoglobin: 13 g/dL (ref 11.6–15.9)
LYMPHS ABS: 1.2 10*3/uL (ref 0.9–3.3)
LYMPHS PCT: 19 %
MCH: 31.2 pg (ref 25.1–34.0)
MCHC: 33.9 g/dL (ref 31.5–36.0)
MCV: 92.3 fL (ref 79.5–101.0)
MONO ABS: 0.4 10*3/uL (ref 0.1–0.9)
MONOS PCT: 7 %
Neutro Abs: 4.3 10*3/uL (ref 1.5–6.5)
Neutrophils Relative %: 70 %
PLATELETS: 296 10*3/uL (ref 145–400)
RBC: 4.17 MIL/uL (ref 3.70–5.45)
RDW: 13.8 % (ref 11.2–14.5)
WBC: 5.9 10*3/uL (ref 3.9–10.3)

## 2018-04-14 LAB — CMP (CANCER CENTER ONLY)
ANION GAP: 7 (ref 3–11)
AST: 13 U/L (ref 5–34)
Albumin: 3.4 g/dL — ABNORMAL LOW (ref 3.5–5.0)
Alkaline Phosphatase: 105 U/L (ref 40–150)
BUN: 11 mg/dL (ref 7–26)
CHLORIDE: 101 mmol/L (ref 98–109)
CO2: 28 mmol/L (ref 22–29)
Calcium: 9.4 mg/dL (ref 8.4–10.4)
Creatinine: 0.71 mg/dL (ref 0.60–1.10)
GFR, Est AFR Am: >60 mL/min (ref >60)
Glucose, Bld: 73 mg/dL (ref 70–140)
Potassium: 4.1 mmol/L (ref 3.5–5.1)
SODIUM: 136 mmol/L (ref 136–145)
Total Bilirubin: 0.3 mg/dL (ref 0.2–1.2)
Total Protein: 6.6 g/dL (ref 6.4–8.3)

## 2018-04-14 NOTE — Progress Notes (Signed)
  [place] OFFICE PROGRESS NOTE   Diagnosis: Rectal cancer  INTERVAL HISTORY:   Ms. Carlo returns as scheduled.  She continues radiation and Xeloda.  She denies nausea/vomiting.  No mouth sores.  No diarrhea.  No hand or foot pain or redness.  No recent rectal bleeding.  No rectal pain.  Objective:  Vital signs in last 24 hours:  Blood pressure 101/74, pulse 73, temperature 97.6 F (36.4 C), temperature source Oral, resp. rate 18, height 5\' 1"  (1.549 m), weight 120 lb 1.6 oz (54.5 kg), SpO2 98 %.    HEENT: No thrush or ulcers. Resp: Lungs clear bilaterally. Cardio: Regular rate and rhythm. GI: Abdomen soft and nontender.  No hepatomegaly. Vascular: No leg edema. Skin: Palms with mild erythema.  No skin breakdown.   Lab Results:  Lab Results  Component Value Date   WBC 5.9 04/14/2018   HGB 13.0 04/14/2018   HCT 38.5 04/14/2018   MCV 92.3 04/14/2018   PLT 296 04/14/2018   NEUTROABS 4.3 04/14/2018    Imaging:  No results found.  Medications: I have reviewed the patient's current medications.  Assessment/Plan: 1. Rectal cancer, obstructing rectal mass on colonoscopy 03/01/2018- biopsy confirmed adenocarcinoma ? Incomplete colonoscopy secondary to obstructing tumor ? Staging CTs on 03/03/2018- small mediastinal/hilar lymph nodes, indeterminate lingula nodule, rectal mass, small perirectal lymph nodes, mass measured at approximately 4.9 cm from the anal verge ? MRI pelvis 03/11/2018- T3c,N2 ? Radiation/Xeloda 04/03/2018 2. Tobacco use 3. COPD 4. Family history of multiple cancers   Disposition: Deborah Arroyo appears stable.  She continues neoadjuvant radiation and Xeloda.  Thus far she seems to be tolerating treatment well.  We reviewed the CBC and chemistry panel from today.  She will return for lab and follow-up in 2 weeks.  She will contact the office in the interim with any problems.    Ned Card ANP/GNP-BC   04/14/2018  12:24 PM

## 2018-04-14 NOTE — Telephone Encounter (Signed)
Scheduled appt per 6/14 los - left message for patient with appt date and time.

## 2018-04-17 ENCOUNTER — Ambulatory Visit
Admission: RE | Admit: 2018-04-17 | Discharge: 2018-04-17 | Disposition: A | Discharge status: Home / Self Care | Payer: Medicare Other | Source: Ambulatory Visit | Attending: Radiation Oncology | Admitting: Radiation Oncology

## 2018-04-17 DIAGNOSIS — J449 Chronic obstructive pulmonary disease, unspecified: Secondary | ICD-10-CM | POA: Diagnosis not present

## 2018-04-17 DIAGNOSIS — C20 Malignant neoplasm of rectum: Secondary | ICD-10-CM | POA: Diagnosis not present

## 2018-04-17 DIAGNOSIS — Z510 Encounter for antineoplastic radiation therapy: Secondary | ICD-10-CM | POA: Diagnosis not present

## 2018-04-17 DIAGNOSIS — Z720 Tobacco use: Secondary | ICD-10-CM | POA: Diagnosis not present

## 2018-04-17 DIAGNOSIS — Z809 Family history of malignant neoplasm, unspecified: Secondary | ICD-10-CM | POA: Diagnosis not present

## 2018-04-18 ENCOUNTER — Ambulatory Visit
Admission: RE | Admit: 2018-04-18 | Discharge: 2018-04-18 | Disposition: A | Discharge status: Home / Self Care | Payer: Medicare Other | Source: Ambulatory Visit | Attending: Radiation Oncology | Admitting: Radiation Oncology

## 2018-04-18 ENCOUNTER — Telehealth: Payer: Self-pay

## 2018-04-18 DIAGNOSIS — Z720 Tobacco use: Secondary | ICD-10-CM | POA: Diagnosis not present

## 2018-04-18 DIAGNOSIS — Z809 Family history of malignant neoplasm, unspecified: Secondary | ICD-10-CM | POA: Diagnosis not present

## 2018-04-18 DIAGNOSIS — Z510 Encounter for antineoplastic radiation therapy: Secondary | ICD-10-CM | POA: Diagnosis not present

## 2018-04-18 DIAGNOSIS — J449 Chronic obstructive pulmonary disease, unspecified: Secondary | ICD-10-CM | POA: Diagnosis not present

## 2018-04-18 DIAGNOSIS — C20 Malignant neoplasm of rectum: Secondary | ICD-10-CM | POA: Diagnosis not present

## 2018-04-18 NOTE — Telephone Encounter (Signed)
Call from pt requesting appt time on 6/28 be moved to earlier. Per Deborah Arroyo, no earlier appts. Pt voiced understanding and would like to have appt closer to radiation "even if its on a different day". Ok per Deborah Haw NP. Message sent to scheduling

## 2018-04-19 ENCOUNTER — Telehealth: Payer: Self-pay | Admitting: Oncology

## 2018-04-19 ENCOUNTER — Ambulatory Visit
Admission: RE | Admit: 2018-04-19 | Discharge: 2018-04-19 | Disposition: A | Discharge status: Home / Self Care | Payer: Medicare Other | Source: Ambulatory Visit | Attending: Radiation Oncology | Admitting: Radiation Oncology

## 2018-04-19 DIAGNOSIS — Z510 Encounter for antineoplastic radiation therapy: Secondary | ICD-10-CM | POA: Diagnosis not present

## 2018-04-19 DIAGNOSIS — J449 Chronic obstructive pulmonary disease, unspecified: Secondary | ICD-10-CM | POA: Diagnosis not present

## 2018-04-19 DIAGNOSIS — C20 Malignant neoplasm of rectum: Secondary | ICD-10-CM | POA: Diagnosis not present

## 2018-04-19 DIAGNOSIS — Z720 Tobacco use: Secondary | ICD-10-CM | POA: Diagnosis not present

## 2018-04-19 DIAGNOSIS — Z809 Family history of malignant neoplasm, unspecified: Secondary | ICD-10-CM | POA: Diagnosis not present

## 2018-04-19 NOTE — Telephone Encounter (Signed)
Per 6/18 sch message - unable to find a time available that would work with treatment time

## 2018-04-20 ENCOUNTER — Ambulatory Visit
Admission: RE | Admit: 2018-04-20 | Discharge: 2018-04-20 | Disposition: A | Discharge status: Home / Self Care | Payer: Medicare Other | Source: Ambulatory Visit | Attending: Radiation Oncology | Admitting: Radiation Oncology

## 2018-04-20 DIAGNOSIS — Z809 Family history of malignant neoplasm, unspecified: Secondary | ICD-10-CM | POA: Diagnosis not present

## 2018-04-20 DIAGNOSIS — J449 Chronic obstructive pulmonary disease, unspecified: Secondary | ICD-10-CM | POA: Diagnosis not present

## 2018-04-20 DIAGNOSIS — Z510 Encounter for antineoplastic radiation therapy: Secondary | ICD-10-CM | POA: Diagnosis not present

## 2018-04-20 DIAGNOSIS — Z720 Tobacco use: Secondary | ICD-10-CM | POA: Diagnosis not present

## 2018-04-20 DIAGNOSIS — C20 Malignant neoplasm of rectum: Secondary | ICD-10-CM | POA: Diagnosis not present

## 2018-04-21 ENCOUNTER — Ambulatory Visit
Admission: RE | Admit: 2018-04-21 | Discharge: 2018-04-21 | Disposition: A | Discharge status: Home / Self Care | Payer: Medicare Other | Source: Ambulatory Visit | Attending: Radiation Oncology | Admitting: Radiation Oncology

## 2018-04-21 DIAGNOSIS — Z510 Encounter for antineoplastic radiation therapy: Secondary | ICD-10-CM | POA: Diagnosis not present

## 2018-04-21 DIAGNOSIS — J449 Chronic obstructive pulmonary disease, unspecified: Secondary | ICD-10-CM | POA: Diagnosis not present

## 2018-04-21 DIAGNOSIS — Z809 Family history of malignant neoplasm, unspecified: Secondary | ICD-10-CM | POA: Diagnosis not present

## 2018-04-21 DIAGNOSIS — C20 Malignant neoplasm of rectum: Secondary | ICD-10-CM | POA: Diagnosis not present

## 2018-04-21 DIAGNOSIS — Z720 Tobacco use: Secondary | ICD-10-CM | POA: Diagnosis not present

## 2018-04-23 ENCOUNTER — Encounter: Payer: Self-pay | Admitting: Internal Medicine

## 2018-04-24 ENCOUNTER — Ambulatory Visit
Admission: RE | Admit: 2018-04-24 | Discharge: 2018-04-24 | Disposition: A | Discharge status: Home / Self Care | Payer: Medicare Other | Source: Ambulatory Visit | Attending: Radiation Oncology | Admitting: Radiation Oncology

## 2018-04-24 DIAGNOSIS — Z720 Tobacco use: Secondary | ICD-10-CM | POA: Diagnosis not present

## 2018-04-24 DIAGNOSIS — Z809 Family history of malignant neoplasm, unspecified: Secondary | ICD-10-CM | POA: Diagnosis not present

## 2018-04-24 DIAGNOSIS — C20 Malignant neoplasm of rectum: Secondary | ICD-10-CM | POA: Diagnosis not present

## 2018-04-24 DIAGNOSIS — Z510 Encounter for antineoplastic radiation therapy: Secondary | ICD-10-CM | POA: Diagnosis not present

## 2018-04-24 DIAGNOSIS — J449 Chronic obstructive pulmonary disease, unspecified: Secondary | ICD-10-CM | POA: Diagnosis not present

## 2018-04-25 ENCOUNTER — Ambulatory Visit
Admission: RE | Admit: 2018-04-25 | Discharge: 2018-04-25 | Disposition: A | Discharge status: Home / Self Care | Payer: Medicare Other | Source: Ambulatory Visit | Attending: Radiation Oncology | Admitting: Radiation Oncology

## 2018-04-25 DIAGNOSIS — Z809 Family history of malignant neoplasm, unspecified: Secondary | ICD-10-CM | POA: Diagnosis not present

## 2018-04-25 DIAGNOSIS — C20 Malignant neoplasm of rectum: Secondary | ICD-10-CM | POA: Diagnosis not present

## 2018-04-25 DIAGNOSIS — Z720 Tobacco use: Secondary | ICD-10-CM | POA: Diagnosis not present

## 2018-04-25 DIAGNOSIS — J449 Chronic obstructive pulmonary disease, unspecified: Secondary | ICD-10-CM | POA: Diagnosis not present

## 2018-04-25 DIAGNOSIS — Z510 Encounter for antineoplastic radiation therapy: Secondary | ICD-10-CM | POA: Diagnosis not present

## 2018-04-26 ENCOUNTER — Ambulatory Visit
Admission: RE | Admit: 2018-04-26 | Discharge: 2018-04-26 | Disposition: A | Discharge status: Home / Self Care | Payer: Medicare Other | Source: Ambulatory Visit | Attending: Radiation Oncology | Admitting: Radiation Oncology

## 2018-04-26 DIAGNOSIS — Z809 Family history of malignant neoplasm, unspecified: Secondary | ICD-10-CM | POA: Diagnosis not present

## 2018-04-26 DIAGNOSIS — J449 Chronic obstructive pulmonary disease, unspecified: Secondary | ICD-10-CM | POA: Diagnosis not present

## 2018-04-26 DIAGNOSIS — C20 Malignant neoplasm of rectum: Secondary | ICD-10-CM | POA: Diagnosis not present

## 2018-04-26 DIAGNOSIS — Z510 Encounter for antineoplastic radiation therapy: Secondary | ICD-10-CM | POA: Diagnosis not present

## 2018-04-26 DIAGNOSIS — Z720 Tobacco use: Secondary | ICD-10-CM | POA: Diagnosis not present

## 2018-04-27 ENCOUNTER — Other Ambulatory Visit: Payer: Self-pay | Admitting: Radiation Oncology

## 2018-04-27 ENCOUNTER — Ambulatory Visit
Admission: RE | Admit: 2018-04-27 | Discharge: 2018-04-27 | Disposition: A | Discharge status: Home / Self Care | Payer: Medicare Other | Source: Ambulatory Visit | Attending: Radiation Oncology | Admitting: Radiation Oncology

## 2018-04-27 DIAGNOSIS — Z809 Family history of malignant neoplasm, unspecified: Secondary | ICD-10-CM | POA: Diagnosis not present

## 2018-04-27 DIAGNOSIS — C20 Malignant neoplasm of rectum: Secondary | ICD-10-CM | POA: Diagnosis not present

## 2018-04-27 DIAGNOSIS — Z510 Encounter for antineoplastic radiation therapy: Secondary | ICD-10-CM | POA: Diagnosis not present

## 2018-04-27 DIAGNOSIS — Z720 Tobacco use: Secondary | ICD-10-CM | POA: Diagnosis not present

## 2018-04-27 DIAGNOSIS — J449 Chronic obstructive pulmonary disease, unspecified: Secondary | ICD-10-CM | POA: Diagnosis not present

## 2018-04-27 MED ORDER — VALACYCLOVIR HCL 500 MG PO TABS: 500.0000 mg | ORAL_TABLET | Freq: Two times a day (BID) | ORAL | 0 refills | Status: AC

## 2018-04-27 MED FILL — CAPECITABINE 500 MG TABLET: 500 | 10 days supply | Qty: 40 | Fill #1

## 2018-04-28 ENCOUNTER — Inpatient Hospital Stay: Payer: Medicare Other

## 2018-04-28 ENCOUNTER — Ambulatory Visit
Admission: RE | Admit: 2018-04-28 | Discharge: 2018-04-28 | Disposition: A | Discharge status: Home / Self Care | Payer: Medicare Other | Source: Ambulatory Visit | Attending: Radiation Oncology | Admitting: Radiation Oncology

## 2018-04-28 ENCOUNTER — Inpatient Hospital Stay: Payer: Medicare Other | Admitting: Nurse Practitioner

## 2018-04-28 ENCOUNTER — Encounter: Payer: Self-pay | Admitting: Nurse Practitioner

## 2018-04-28 VITALS — BP 118/73 | HR 73 | Temp 98.6°F | Resp 18 | Ht 61.0 in | Wt 115.9 lb

## 2018-04-28 DIAGNOSIS — Z510 Encounter for antineoplastic radiation therapy: Secondary | ICD-10-CM | POA: Diagnosis not present

## 2018-04-28 DIAGNOSIS — C20 Malignant neoplasm of rectum: Secondary | ICD-10-CM

## 2018-04-28 DIAGNOSIS — Z720 Tobacco use: Secondary | ICD-10-CM | POA: Diagnosis not present

## 2018-04-28 DIAGNOSIS — Z923 Personal history of irradiation: Secondary | ICD-10-CM | POA: Diagnosis not present

## 2018-04-28 DIAGNOSIS — Z809 Family history of malignant neoplasm, unspecified: Secondary | ICD-10-CM | POA: Diagnosis not present

## 2018-04-28 DIAGNOSIS — J449 Chronic obstructive pulmonary disease, unspecified: Secondary | ICD-10-CM | POA: Diagnosis not present

## 2018-04-28 LAB — CBC WITH DIFFERENTIAL (CANCER CENTER ONLY)
BASOS PCT: 1 %
Basophils Absolute: 0 10*3/uL (ref 0.0–0.1)
EOS ABS: 0.6 10*3/uL — AB (ref 0.0–0.5)
EOS PCT: 7 %
HCT: 37.7 % (ref 34.8–46.6)
HEMOGLOBIN: 12.8 g/dL (ref 11.6–15.9)
LYMPHS ABS: 1 10*3/uL (ref 0.9–3.3)
Lymphocytes Relative: 13 %
MCH: 31.4 pg (ref 25.1–34.0)
MCHC: 34 g/dL (ref 31.5–36.0)
MCV: 92.4 fL (ref 79.5–101.0)
Monocytes Absolute: 0.7 10*3/uL (ref 0.1–0.9)
Monocytes Relative: 9 %
NEUTROS PCT: 70 %
Neutro Abs: 5.7 10*3/uL (ref 1.5–6.5)
Platelet Count: 225 10*3/uL (ref 145–400)
RBC: 4.08 MIL/uL (ref 3.70–5.45)
RDW: 15.7 % — ABNORMAL HIGH (ref 11.2–14.5)
WBC: 8 10*3/uL (ref 3.9–10.3)

## 2018-04-28 LAB — COMPREHENSIVE METABOLIC PANEL
ALBUMIN: 3.8 g/dL (ref 3.5–5.0)
ALK PHOS: 101 U/L (ref 38–126)
ALT: 10 U/L (ref 0–44)
ANION GAP: 7 (ref 5–15)
AST: 15 U/L (ref 15–41)
BUN: 10 mg/dL (ref 8–23)
CHLORIDE: 102 mmol/L (ref 98–111)
CO2: 28 mmol/L (ref 22–32)
CREATININE: 0.77 mg/dL (ref 0.44–1.00)
Calcium: 9 mg/dL (ref 8.9–10.3)
GFR calc non Af Amer: >60 mL/min (ref >60)
Glucose, Bld: 91 mg/dL (ref 70–99)
Potassium: 3.4 mmol/L — ABNORMAL LOW (ref 3.5–5.1)
SODIUM: 137 mmol/L (ref 135–145)
Total Bilirubin: 0.4 mg/dL (ref 0.3–1.2)
Total Protein: 7 g/dL (ref 6.5–8.1)

## 2018-04-28 NOTE — Progress Notes (Signed)
2:58pm pt approached nurse pod states " what is taking so long"  Informed pt NP is with another pt and will be with her as soon as she can. Pt states " I've been waiting. I'm not going to wait all day." Walked with pt back to exam room discussed I would be happy to r/s her appt if she was not able to stay. Informed pt it was 3pm, her appt with NP was scheduled for 3:15pm. Pt states " I was told it was at 2:45pm, that's what I wrote down. I don't know why I have to keep coming back all the time." Again explained provider would be able to answer this concern and all other. Pt states" just go ahead and reschedule. I'm leaving" Message to scheduling.

## 2018-04-28 NOTE — Progress Notes (Signed)
Deborah Arroyo was unable to stay for today's office visit.  She will be rescheduled.

## 2018-04-30 ENCOUNTER — Encounter: Payer: Self-pay | Admitting: *Deleted

## 2018-05-01 ENCOUNTER — Telehealth: Payer: Self-pay | Admitting: Emergency Medicine

## 2018-05-01 ENCOUNTER — Other Ambulatory Visit: Payer: Self-pay | Admitting: Radiation Oncology

## 2018-05-01 ENCOUNTER — Telehealth: Payer: Self-pay | Admitting: *Deleted

## 2018-05-01 ENCOUNTER — Ambulatory Visit
Admission: RE | Admit: 2018-05-01 | Discharge: 2018-05-01 | Disposition: A | Discharge status: Home / Self Care | Payer: Medicare Other | Source: Ambulatory Visit | Attending: Radiation Oncology | Admitting: Radiation Oncology

## 2018-05-01 ENCOUNTER — Telehealth: Payer: Self-pay | Admitting: Nurse Practitioner

## 2018-05-01 DIAGNOSIS — Z510 Encounter for antineoplastic radiation therapy: Secondary | ICD-10-CM | POA: Diagnosis not present

## 2018-05-01 DIAGNOSIS — C20 Malignant neoplasm of rectum: Secondary | ICD-10-CM | POA: Insufficient documentation

## 2018-05-01 MED ORDER — HYDROCODONE-ACETAMINOPHEN 5-325 MG PO TABS: 1.0000 | ORAL_TABLET | Freq: Four times a day (QID) | ORAL | 0 refills | Status: DC | PRN

## 2018-05-01 NOTE — Telephone Encounter (Addendum)
VM left for pt to call back regarding this note.   ----- Message from Owens Shark, NP sent at 04/28/2018  4:13 PM EDT ----- Please let her know potassium level is mildly decreased.  Is she having diarrhea?

## 2018-05-01 NOTE — Telephone Encounter (Signed)
Tried to call regarding 7/2

## 2018-05-01 NOTE — Telephone Encounter (Signed)
Returned the patients call.  Left her a voicemail stating we were sending in a pain medicine prescription to [facility].  Left call back number in the event she had any questions.  Gloriajean Dell. Leonie Green, BSN

## 2018-05-02 ENCOUNTER — Ambulatory Visit
Admission: RE | Admit: 2018-05-02 | Discharge: 2018-05-02 | Disposition: A | Discharge status: Home / Self Care | Payer: Medicare Other | Source: Ambulatory Visit | Attending: Radiation Oncology | Admitting: Radiation Oncology

## 2018-05-02 ENCOUNTER — Ambulatory Visit: Payer: Medicare Other | Admitting: Nurse Practitioner

## 2018-05-02 DIAGNOSIS — Z510 Encounter for antineoplastic radiation therapy: Secondary | ICD-10-CM | POA: Diagnosis not present

## 2018-05-02 DIAGNOSIS — C20 Malignant neoplasm of rectum: Secondary | ICD-10-CM | POA: Diagnosis not present

## 2018-05-03 ENCOUNTER — Ambulatory Visit
Admission: RE | Admit: 2018-05-03 | Discharge: 2018-05-03 | Disposition: A | Discharge status: Home / Self Care | Payer: Medicare Other | Source: Ambulatory Visit | Attending: Radiation Oncology | Admitting: Radiation Oncology

## 2018-05-03 DIAGNOSIS — C20 Malignant neoplasm of rectum: Secondary | ICD-10-CM | POA: Diagnosis not present

## 2018-05-03 DIAGNOSIS — Z510 Encounter for antineoplastic radiation therapy: Secondary | ICD-10-CM | POA: Diagnosis not present

## 2018-05-03 MED ORDER — PHENAZOPYRIDINE HCL 200 MG PO TABS: 200.0000 mg | ORAL_TABLET | Freq: Three times a day (TID) | ORAL | 1 refills | Status: DC | PRN

## 2018-05-05 ENCOUNTER — Ambulatory Visit
Admission: RE | Admit: 2018-05-05 | Discharge: 2018-05-05 | Disposition: A | Discharge status: Home / Self Care | Payer: Medicare Other | Source: Ambulatory Visit | Attending: Radiation Oncology | Admitting: Radiation Oncology

## 2018-05-05 DIAGNOSIS — C20 Malignant neoplasm of rectum: Secondary | ICD-10-CM | POA: Diagnosis not present

## 2018-05-05 DIAGNOSIS — Z510 Encounter for antineoplastic radiation therapy: Secondary | ICD-10-CM | POA: Diagnosis not present

## 2018-05-08 ENCOUNTER — Telehealth: Payer: Self-pay | Admitting: *Deleted

## 2018-05-08 ENCOUNTER — Other Ambulatory Visit: Payer: Self-pay | Admitting: Radiation Oncology

## 2018-05-08 ENCOUNTER — Ambulatory Visit
Admission: RE | Admit: 2018-05-08 | Discharge: 2018-05-08 | Disposition: A | Discharge status: Home / Self Care | Payer: Medicare Other | Source: Ambulatory Visit | Attending: Radiation Oncology | Admitting: Radiation Oncology

## 2018-05-08 DIAGNOSIS — Z510 Encounter for antineoplastic radiation therapy: Secondary | ICD-10-CM | POA: Diagnosis not present

## 2018-05-08 DIAGNOSIS — C20 Malignant neoplasm of rectum: Secondary | ICD-10-CM

## 2018-05-08 LAB — URINALYSIS, COMPLETE (UACMP) WITH MICROSCOPIC
BILIRUBIN URINE: NEGATIVE
Bacteria, UA: NONE SEEN
Glucose, UA: NEGATIVE mg/dL
Ketones, ur: NEGATIVE mg/dL
NITRITE: POSITIVE — AB
PH: 6 (ref 5.0–8.0)
Protein, ur: NEGATIVE mg/dL
RBC / HPF: >50 RBC/hpf — ABNORMAL HIGH (ref 0–5)
SPECIFIC GRAVITY, URINE: 1.014 (ref 1.005–1.030)
WBC, UA: >50 WBC/hpf — ABNORMAL HIGH (ref 0–5)

## 2018-05-08 MED ORDER — SULFAMETHOXAZOLE-TRIMETHOPRIM 800-160 MG PO TABS: 1.0000 | ORAL_TABLET | Freq: Two times a day (BID) | ORAL | 0 refills | Status: DC

## 2018-05-08 MED ORDER — TRAMADOL HCL 50 MG PO TABS: 50.0000 mg | ORAL_TABLET | Freq: Four times a day (QID) | ORAL | 0 refills | Status: DC | PRN

## 2018-05-08 NOTE — Progress Notes (Signed)
Pt was seen due to dysuria and vulvodynia. On exam she has mild hyperpigmentation of the vulva. Given her symptoms, we will start silvadene, tramadol as norco has made her nauseated, and check a UA with C&S.    Carola Rhine, PAC

## 2018-05-08 NOTE — Telephone Encounter (Signed)
Called the patient to give her the results of her lab work.  I let her know that her results were positive and a prescription for Bactrim has been sent in to her pharmacy.  Will continue to follow as necessary.  Gloriajean Dell. Leonie Green, BSN

## 2018-05-09 ENCOUNTER — Ambulatory Visit
Admission: RE | Admit: 2018-05-09 | Discharge: 2018-05-09 | Disposition: A | Discharge status: Home / Self Care | Payer: Medicare Other | Source: Ambulatory Visit | Attending: Radiation Oncology | Admitting: Radiation Oncology

## 2018-05-09 ENCOUNTER — Telehealth: Payer: Self-pay | Admitting: Nurse Practitioner

## 2018-05-09 ENCOUNTER — Inpatient Hospital Stay: Payer: Medicare Other | Admitting: Nurse Practitioner

## 2018-05-09 ENCOUNTER — Other Ambulatory Visit: Payer: Self-pay | Admitting: Radiation Oncology

## 2018-05-09 DIAGNOSIS — Z510 Encounter for antineoplastic radiation therapy: Secondary | ICD-10-CM | POA: Diagnosis not present

## 2018-05-09 DIAGNOSIS — C20 Malignant neoplasm of rectum: Secondary | ICD-10-CM | POA: Diagnosis not present

## 2018-05-09 LAB — URINE CULTURE

## 2018-05-09 NOTE — Telephone Encounter (Signed)
Called patient per 7/8 sch message - unable to reach - left message for patient to call back to schedule. -   No time available for f/u before or after rad onc treatment.

## 2018-05-10 ENCOUNTER — Ambulatory Visit
Admission: RE | Admit: 2018-05-10 | Discharge: 2018-05-10 | Disposition: A | Discharge status: Home / Self Care | Payer: Medicare Other | Source: Ambulatory Visit | Attending: Radiation Oncology | Admitting: Radiation Oncology

## 2018-05-10 DIAGNOSIS — Z510 Encounter for antineoplastic radiation therapy: Secondary | ICD-10-CM | POA: Diagnosis not present

## 2018-05-10 DIAGNOSIS — C20 Malignant neoplasm of rectum: Secondary | ICD-10-CM | POA: Diagnosis not present

## 2018-05-11 ENCOUNTER — Ambulatory Visit
Admission: RE | Admit: 2018-05-11 | Discharge: 2018-05-11 | Disposition: A | Discharge status: Home / Self Care | Payer: Medicare Other | Source: Ambulatory Visit | Attending: Radiation Oncology | Admitting: Radiation Oncology

## 2018-05-11 ENCOUNTER — Encounter: Payer: Self-pay | Admitting: Radiation Oncology

## 2018-05-11 DIAGNOSIS — Z510 Encounter for antineoplastic radiation therapy: Secondary | ICD-10-CM | POA: Diagnosis not present

## 2018-05-11 DIAGNOSIS — C20 Malignant neoplasm of rectum: Secondary | ICD-10-CM | POA: Diagnosis not present

## 2018-05-22 ENCOUNTER — Ambulatory Visit
Admission: RE | Admit: 2018-05-22 | Discharge: 2018-05-22 | Disposition: A | Discharge status: Home / Self Care | Payer: Medicare Other | Source: Ambulatory Visit | Attending: Oncology | Admitting: Oncology

## 2018-05-22 ENCOUNTER — Ambulatory Visit: Payer: Self-pay | Admitting: Surgery

## 2018-05-22 DIAGNOSIS — C20 Malignant neoplasm of rectum: Secondary | ICD-10-CM

## 2018-05-22 NOTE — H&P (Signed)
CC: Newly diagnosed rectal cancer  HPI: Deborah Arroyo is a pleasant 65yoF with no known past medical history. For at least the last year she reports having had issues with hematochezia with each bowel movement. She denies whatsoever any abdominal pain, cramping, bloating, nausea, vomiting. She reports having normal bowel movements throughout all this. She has been tolerating a diet well. She was seen by Dr. Hilarie Fredrickson for the hematochezia and taken for colonoscopy on 03/01/18 which was notable for a circumferential partially obstructing mass in the rectum approximately 5 cm from the anal verge that could not be traversed with a EGD scope. Biopsies returned adenocarcinoma. The area distal to this mass was tattooed. She reports issues with incontinence of her feces for at least the last 1-2 years and this has been getting worse over time although intervally improved. Her incontinence is primarily to liquid stool as well as gas. She previously wore a pad daily. She does not believe this significantly impacts her daily quality of life or changes her activities based on this, but experiences daily accidents. She reports her weight as being stable and has gained 3 lbs in the last month.  She was seen by one of my colleagues in medical oncology, Dr. Benay Spice earlier this week and was quite frustrated with her new diagnosis of cancer as well as the amount of information being delivered to her regarding all of this. She left his office frustrated and declined follow-up the following day with Dr. Lisbeth Renshaw radiation oncology.  She underwent MRI pelvis 03/13/18 which demonstrated a locally advanced rectal cancer- cT3cN2. CT C/A/P 03/03/18 - showed a large rectal mass approximately 6 cm in length 5 cm of anorectal junction with multiple surrounding lymph nodes. Mildly enlarged right hilar lymph node not favored to be metastatic based on the radiology interpretation and other subcentimeter nodules which warrant  surveillance  INTERVAL HX She completed Xeloda + XRT 05/11/18. She tolerated this reasonably well. Since completing this, she reports improvement in some of her symptoms. She still does have some incontinent episodes to primarily liquid stool and accidents occurring once a week now. She does occasionally also have incontinence to gas. She denies incontinence to solid stool. She does not wear a pad or diaper at this time - improved since being seen previously. She reports doing well today. She denies any fevers/chills/nausea/vomiting. She is having bowel movements. She denies any other complaints   PMH: Denies  PSH: Open appendectomy - RLQ Laparoscopic cholecystectomy BTL-low midline incision Hysterectomy-TVH-denies prior radiation but this was done for cervical cancer Shoulder surgery Eye surgery  OB/Gyn: G4P3, all vaginal deliveries  FHx: Brother-prostate ca Mom-uterine ca Sister-gastric and ovarian ca Brother-lung cancer Daughter-breast ca  Social: Denies use of tobacco-one to one and a half packs per day for the last 40-42 years; denies use of alcohol or drugs. She is currently retired-has held numerous prior jobs, the most recent being it [employer]  ROS: A comprehensive 10 system review of systems was completed with the patient and pertinent findings as noted above  Hgb 12/2017 14.7 (from 14.3 08/2017) CEA 03/01/18: 1.9 CT C/A/P 03/03/18: Large rectal mass approximate 6 cm in length located 5 cm from the anorectal junction with multiple surrounding perirectal/presacral lymph nodes likely representing local nodal metastatic spread. Mildly enlarged right hilar lymph node favored incisional by radiologist. 6 x 3 mm lingular nodule warranting surveillance.  The patient is a 67 year old female.   Allergies Deborah Arroyo, Deborah Arroyo; 05/22/2018 10:40 AM) Deborah Arroyo *ANALGESICS - ANTI-INFLAMMATORY*  Urticaria, Swelling. Allergies Reconciled   Medication History Deborah Arroyo, Deborah Arroyo;  05/22/2018 10:40 AM) Deborah Arroyo, Deborah) Active. Deborah Arroyo, Deborah as needed) Active. Medications Reconciled    Review of Systems Deborah Arroyo M. Kel Senn Deborah Arroyo; 05/22/2018 11:30 AM) General Not Present- Appetite Loss, Chills, Fatigue, Fever, Night Sweats, Weight Gain and Weight Loss. Skin Not Present- Change in Wart/Mole, Dryness, Hives, Jaundice, New Lesions, Non-Healing Wounds, Rash and Ulcer. Respiratory Not Present- Decreased Exercise Tolerance and Difficulty Breathing. Breast Not Present- Breast Mass, Breast Pain, Nipple Discharge and Skin Changes. Gastrointestinal Present- Bloating and Bloody Stool. Not Present- Abdominal Pain, Change in Bowel Habits, Chronic diarrhea, Constipation, Excessive gas, Hemorrhoids, Indigestion, Nausea, Rectal Pain and Vomiting. Female Genitourinary Not Present- Frequency, Nocturia, Painful Urination, Pelvic Pain and Urgency. Musculoskeletal Present- Back Pain. Not Present- Joint Pain, Joint Stiffness, Muscle Pain, Muscle Weakness and Swelling of Extremities. Neurological Not Present- Decreased Memory, Fainting, Headaches, Numbness, Seizures, Tingling, Tremor, Trouble walking and Weakness. Endocrine Not Present- Cold Intolerance, Excessive Hunger, Hair Changes, Heat Intolerance, Hot flashes and New Diabetes. Hematology Not Present- Blood Thinners, Easy Bruising, Excessive bleeding, Gland problems, HIV and Persistent Infections.  Vitals Deborah Arroyo Deborah Arroyo; 05/22/2018 10:39 AM) 05/22/2018 10:39 AM Weight: 115.6 lb Height: 61in Body Surface Area: 1.5 m Body Mass Index: 21.84 kg/m  Temp.: 97.58F  Pulse: 87 (Regular)  BP: 130/82 (Sitting, Left Arm, Standard)       Physical Exam Deborah Arroyo M. Jeraldin Fesler Deborah Arroyo; 05/22/2018 11:32 AM) The physical exam findings are as follows: Note:Constitutional: No acute distress; conversant; no deformities Eyes: Moist conjunctiva; no lid lag; anicteric sclerae; pupils equal round and  reactive to light Neck: Trachea midline; no palpable thyromegaly Lungs: Normal respiratory effort; no tactile fremitus CV: Regular rate and rhythm; no palpable thrill; no pitting edema GI: Abdomen soft, nontender, nondistended; no palpable hepatosplenomegaly Anorectal: DRE - mass remains palpable although small/softer; mobile. Remains at finger tip - estimated 4-5cm from anal verge by palpation; unable to determine if anterior/posterior but was circumferential on imaging previously MSK: Normal gait; no clubbing/cyanosis Psychiatric: Appropriate affect; alert and oriented 3 Lymphatic: No palpable cervical or axillary lymphadenopathy **A chaperone, Lennart Pall, was present for the entire physical exam    Assessment & Plan Deborah Arroyo M. Devynne Sturdivant Deborah Arroyo; 05/22/2018 11:39 AM) RECTAL CANCER (C20) Story: Ms. Tetro is a pleasant 48yoF with 37yr hx of hematochezia, recently diagnosed with large mid rectal cancer - 5cm from anal verge; CEA 1.9; no evidence of metastatic disease on CT C/A/P; MRI-P shows this to be cT3cN2 - s/p Xeloda+XRT - completed 05/11/18 - here for follow-up --The anatomy and physiology of the GI tract was discussed at length with the patient. The pathophysiology of rectal cancer was discussed at length with associated pictures again today. -We discussed options going forward including nonoperative versus watchful waiting, in the setting of complete clinical response) in surgery. Given coordination, we'll go ahead and plan for surgery. In the interim, we will have her rescoped for further assessment of the rectal cancer as well as hopeful clearing of the rest of the colon given that she had an incomplete colonoscopy previously. This should be done in the first week of September. We will contact Dr. Vena Rua office. Impression: -Today, we also discussed surgical options including LAR with colorectal anastomosis and need for temporary DLI. We also discussed the option of a Hartmann's procedure  with permanent end colostomy. We discussed that with baseline issues of incontinence to gas and some liquid stool, postop, her fxn may be quite poor  including incontinent episodes occurring ultiple times per day. We discussed the potential for interfering with quality of life in this setting. We discussed the difficulty associated with undergoing a Hartmann's type procedure at that time. She would like additional time to consider which surgical procedure she would like. -The planned procedure, material risks (including, but not limited to, pain, bleeding, infection, scarring, need for blood transfusion, damage to surrounding structures- blood vessels/nerves/viscus/organs, damage to ureter, urine leak, leak from anastomosis, need for additional procedures, need for stoma which may be permanent, hernia, recurrence, pneumonia, heart attack, stroke, death) benefits and alternatives to surgery were discussed at length. I noted a good probability that the procedure would help improve their symptoms. The patient's questions were answered to her satisfaction, she voiced understanding and elected to proceed with surgery. Additionally, we discussed typical postop expectations and the recovery process.  Signed electronically by Ileana Roup, Deborah Arroyo (05/22/2018 11:39 AM)

## 2018-05-23 ENCOUNTER — Other Ambulatory Visit: Payer: Self-pay

## 2018-05-23 ENCOUNTER — Telehealth: Payer: Self-pay

## 2018-05-23 DIAGNOSIS — Z85038 Personal history of other malignant neoplasm of large intestine: Secondary | ICD-10-CM

## 2018-05-23 LAB — URINE CULTURE

## 2018-05-23 NOTE — Telephone Encounter (Signed)
Patient scheduled for colonoscopy on  07-04-18 at 11:15am with Dr. Hilarie Fredrickson at [facility]. Called and spoke to pt. Scheduled her for a nurse visit on 06-06-18 at 10:00am. She expressed understanding.

## 2018-05-23 NOTE — Telephone Encounter (Signed)
-----   Message from Jerene Bears, MD sent at 05/22/2018  6:59 PM EDT ----- Regarding: RE: Colonoscopy Gerald Stabs Thanks for the message Yes, I should be able to get her in to reattempt colonoscopy in early September We will have my staff work on this I will let you know the results Thanks Willis Modena Please see if [name] could be added on to my hospital schedule for colonoscopy on September 3rd in the AM Hx of colon cancer, incomplete colon. Thanks JMP   ----- Message ----- From: Ileana Roup, MD Sent: 05/22/2018   1:32 PM To: Jerene Bears, MD Subject: Colonoscopy                                    Deborah Arroyo -  I hope all is going well. Thank you for the referral on Deborah Arroyo. Deborah Arroyo completed neoadjuvant chemo/xrt for a low/mid rectal adenoCA. She has had a good response in terms of sxs. She previously had a terminated colonoscopy due to partially obstructing mass. She is tenatively planned for surgery 2nd week of September (waiting ~10wks out from chemoXRT). Would you be able to do a colonoscopy on her preop - ideally very early September. There is a small chance she has resolution of her mass entirely, but in her case, would be quite surprised. Additionally wanted to clear the rest of the colon. If she had a complete response, she would plan to potentially cancel her surgery. I told her we could cross that bridge if that occurs. Let me know what you think.  Gerald Stabs

## 2018-05-23 NOTE — Progress Notes (Signed)
Pt scheduled for colon at [facility] on 07-04-18 at 11:15am with Dr. Hilarie Fredrickson

## 2018-05-24 ENCOUNTER — Other Ambulatory Visit: Payer: Self-pay | Admitting: Urology

## 2018-05-24 ENCOUNTER — Encounter: Payer: Self-pay | Admitting: *Deleted

## 2018-05-24 NOTE — Progress Notes (Signed)
  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 Rectum/ 45 Gy delivered in 25 fractions of 1.8 Gy 2. Boost/ 5.4 Gy delivered in 3 fractions of 1.8 Gy  Beams/energy:    1. 3D/ 15X, 6X, Photon 2. 3D/ 6X, 15X, Photon  Narrative: The patient tolerated radiation treatment relatively well. By the end of treatment, she reported dysuria and occasional urgency, noting that she had a UTI. She denied fatigue, weight loss, nausea, vomiting, bowel issues, skin irritation. She noted good appetite and fluid intake.   Plan: The patient has completed radiation treatment. The patient will return to radiation oncology clinic for routine followup in one month. I advised them to call or return sooner if they have any questions or concerns related to their recovery or treatment.  ------------------------------------------------  Jodelle Gross, MD, PhD  This document serves as a record of services personally performed by Kyung Rudd, MD. It was created on his behalf by Wilburn Mylar, a trained medical scribe. The creation of this record is based on the scribe's personal observations and the provider's statements to them. This document has been checked and approved by the attending provider.

## 2018-05-24 NOTE — Progress Notes (Signed)
0830  Spoke with Deborah Arroyo let her know that the infection appears to be treated and no need for additional abx.  She was happy to receive the result from her  Urine culture report from Shona Simpson, [place]. this morning.

## 2018-05-29 ENCOUNTER — Ambulatory Visit: Payer: Self-pay | Admitting: Surgery

## 2018-06-12 ENCOUNTER — Encounter: Payer: Self-pay | Admitting: Radiation Oncology

## 2018-06-12 ENCOUNTER — Ambulatory Visit
Admission: RE | Admit: 2018-06-12 | Discharge: 2018-06-12 | Disposition: A | Discharge status: Home / Self Care | Payer: Medicare Other | Source: Ambulatory Visit | Attending: Radiation Oncology | Admitting: Radiation Oncology

## 2018-06-12 VITALS — BP 125/80 | HR 72 | Temp 98.0°F | Resp 18 | Ht 61.0 in | Wt 114.0 lb

## 2018-06-12 DIAGNOSIS — Z79899 Other long term (current) drug therapy: Secondary | ICD-10-CM | POA: Diagnosis not present

## 2018-06-12 DIAGNOSIS — Z923 Personal history of irradiation: Secondary | ICD-10-CM | POA: Diagnosis not present

## 2018-06-12 DIAGNOSIS — A601 Herpesviral infection of perianal skin and rectum: Secondary | ICD-10-CM | POA: Diagnosis not present

## 2018-06-12 DIAGNOSIS — Z87440 Personal history of urinary (tract) infections: Secondary | ICD-10-CM | POA: Insufficient documentation

## 2018-06-12 DIAGNOSIS — C20 Malignant neoplasm of rectum: Secondary | ICD-10-CM | POA: Diagnosis not present

## 2018-06-12 NOTE — Addendum Note (Signed)
Encounter addended by: Malena Edman, RN on: 06/12/2018 1:12 PM  Actions taken: Charge Capture section accepted

## 2018-06-12 NOTE — Addendum Note (Signed)
Encounter addended by: Malena Edman, RN on: 06/12/2018 1:13 PM  Actions taken: Medication List reviewed, Problem List reviewed, Allergies reviewed

## 2018-06-12 NOTE — Progress Notes (Signed)
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 Rectum/ 45 Gy delivered in 25 fractions of 1.8 Gy 2. Boost/ 5.4 Gy delivered in 3 fractions of 1.8 Gy   Narrative:  The patient returns today for routine follow-up.  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  She was treated with valacyclovir and this improved.  She also had several courses of antibiotic therapy as a result of urinary tract infections.  She is scheduled to proceed with repeat colonoscopy on 07/04/2018 with Dr. Hilarie Fredrickson, and to proceed with LAR on 07/12/2018 with Dr. Dema Severin.                              On review of systems, the patient states she is feeling much better since last time I spelt spoke with her.  She states that her bowel movements are more formed, she states that she is not having any vaginal pain, rectal bleeding, dysuria or urinary frequency.  No other complaints are verbalized.  ALLERGIES:  is allergic to naproxen sodium [naproxen sodium].  Meds: Current Outpatient Medications  Medication Sig Dispense Refill  . acetaminophen (TYLENOL) 325 MG tablet Take 650 mg every 6 (six) hours as needed by mouth.    . nicotine (NICODERM CQ - DOSED IN MG/24 HOURS) 14 mg/24hr patch Place 1 patch (14 mg total) onto the skin daily. (Patient not taking: Reported on 04/28/2018) 28 patch 0  . prochlorperazine (COMPAZINE) 10 MG tablet Take 1 tablet (10 mg total) by mouth every 6 (six) hours as needed for nausea or vomiting. 20 tablet 0  . traMADol (ULTRAM) 50 MG tablet Take 1 tablet (50 mg total) by mouth every 6 (six) hours  as needed. 90 tablet 0   Current Facility-Administered Medications  Medication Dose Route Frequency Provider Last Rate Last Dose  . 0.9 %  sodium chloride infusion  500 mL Intravenous Once Pyrtle, Lajuan Lines, MD        Physical Findings:  height is 5\' 1"  (1.549 m) and weight is 114 lb (51.7 kg). Her oral temperature is 98 F (36.7 C). Her blood pressure is 125/80 and her pulse is 72. Her respiration is 18 and oxygen saturation is 97%.  Pain Assessment Pain Score: 0-No pain/10 In general this is a well appearing Caucasian female in no acute distress.  She's alert and oriented x4 and appropriate throughout the examination. Cardiopulmonary assessment is negative for acute distress and she exhibits normal effort.  The patient's perineum is assessed and no evidence of desquamation or HSV infection is present.  She does not have any pain at the introitus any longer.  Otherwise she is Tanner stage V with no evidence of visible abnormalities of the external female genitalia.  Lab Findings: Lab Results  Component Value Date   WBC 8.0 04/28/2018   HGB 12.8 04/28/2018   HCT 37.7 04/28/2018   MCV 92.4 04/28/2018   PLT 225 04/28/2018     Radiographic Findings: No results found.  Impression/Plan: 1. At least  cT3cN2 adenocarcinoma of the distal rectum.  The patient appears to be doing well and is recovered from the effects of radiotherapy.  She will proceed with repeat evaluation in the coming weeks and subsequent surgery.  We would be happy to see her back as needed moving forward, she states agreement and understanding with this plan.    Carola Rhine, PAC

## 2018-06-13 ENCOUNTER — Encounter: Payer: Self-pay | Admitting: Radiation Oncology

## 2018-06-16 ENCOUNTER — Ambulatory Visit (AMBULATORY_SURGERY_CENTER): Payer: Self-pay | Admitting: *Deleted

## 2018-06-16 VITALS — Ht 61.0 in | Wt 115.0 lb

## 2018-06-16 DIAGNOSIS — C20 Malignant neoplasm of rectum: Secondary | ICD-10-CM

## 2018-06-16 MED ORDER — NA SULFATE-K SULFATE-MG SULF 17.5-3.13-1.6 GM/177ML PO SOLN: ORAL | 0 refills | Status: DC

## 2018-06-16 NOTE — Progress Notes (Signed)
Family member with pt during pv today. Patient denies any allergies to eggs or soy. Patient denies any problems with anesthesia/sedation. Patient denies any oxygen use at home. Patient denies taking any diet/weight loss medications or blood thinners. Patient was very upset about having to come here for instructions. She states she knows all of this and has the instructions at home. Pt would not let me go over the instructions without her interrupting me. Patient also states that she is not going to answer the phone from [place] anymore. I tried explaining why she should talk with staff member when they call her.

## 2018-06-23 DIAGNOSIS — H903 Sensorineural hearing loss, bilateral: Secondary | ICD-10-CM | POA: Diagnosis not present

## 2018-06-27 NOTE — Patient Instructions (Addendum)
SHAYLINN HLADIK  06/27/2018   Your procedure is scheduled on: 07/12/2018   Report to [facility] Main  Entrance    Report to Admitting at  06:30 AM    Call this number if you have problems the morning of surgery [phone]   Remember: Do not eat food  :After Midnight.   DRINK 2 PRESURGERY ENSURE DRINKS THE NIGHT BEFORE SURGERY AT  1000 PM AND 1 PRESURGERY DRINK THE DAY OF THE PROCEDURE 3 HOURS PRIOR TO SCHEDULED SURGERY. NO SOLIDS AFTER MIDNIGHT THE DAY PRIOR TO THE SURGERY. NOTHING BY MOUTH EXCEPT CLEAR LIQUIDS UNTIL THREE HOURS PRIOR TO SCHEDULED SURGERY. PLEASE FINISH PRESURGERY ENSURE DRINK PER SURGEON ORDER 3 HOURS PRIOR TO SCHEDULED SURGERY TIME WHICH NEEDS TO BE COMPLETED AT 0530am________.  Follow Bowel Prep instructions per MD.  Clear liquid diet on day of Bowel PREP   CLEAR LIQUID DIET   Foods Allowed                                                                     Foods Excluded  Coffee and tea, regular and decaf                             liquids that you cannot  Plain Jell-O in any flavor                                             see through such as: Fruit ices (not with fruit pulp)                                     milk, soups, orange juice  Iced Popsicles                                    All solid food Carbonated beverages, regular and diet                                    Cranberry, grape and apple juices Sports drinks like Gatorade Lightly seasoned clear broth or consume(fat free) Sugar, honey syrup  Sample Menu Breakfast                                Lunch                                     Supper Cranberry juice                    Beef broth  Chicken broth Jell-O                                     Grape juice                           Apple juice Coffee or tea                        Jell-O                                      Popsicle                                                Coffee or tea                         Coffee or tea  _____________________________________________________________________     Take these medicines the morning of surgery with A SIP OF WATER: none                                 You may not have any metal on your body including hair pins and              piercings  Do not wear jewelry, make-up, lotions, powders or perfumes, deodorant             Do not wear nail polish.  Do not shave  48 hours prior to surgery.               Do not bring valuables to the hospital. [place].  Contacts, dentures or bridgework may not be worn into surgery.  Leave suitcase in the car. After surgery it may be brought to your room.                   Please read over the following fact sheets you were given: _____________________________________________________________________  [facility] - Preparing for Surgery Before surgery, you can play an important role.  Because skin is not sterile, your skin needs to be as free of germs as possible.  You can reduce the number of germs on your skin by washing with CHG (chlorahexidine gluconate) soap before surgery.  CHG is an antiseptic cleaner which kills germs and bonds with the skin to continue killing germs even after washing. Please DO NOT use if you have an allergy to CHG or antibacterial soaps.  If your skin becomes reddened/irritated stop using the CHG and inform your nurse when you arrive at Short Stay. Do not shave (including legs and underarms) for at least 48 hours prior to the first CHG shower.  You may shave your face/neck. Please follow these instructions carefully:  1.  Shower with CHG Soap the night before surgery and the  morning of Surgery.  2.  If you choose to wash your hair, wash your hair first as usual with your  normal  shampoo.  3.  After you shampoo, rinse your hair and body thoroughly to remove the  shampoo.                           4.  Use CHG as you would  any other liquid soap.  You can apply chg directly  to the skin and wash                       Gently with a scrungie or clean washcloth.  5.  Apply the CHG Soap to your body ONLY FROM THE NECK DOWN.   Do not use on face/ open                           Wound or open sores. Avoid contact with eyes, ears mouth and genitals (private parts).                       Wash face,  Genitals (private parts) with your normal soap.             6.  Wash thoroughly, paying special attention to the area where your surgery  will be performed.  7.  Thoroughly rinse your body with warm water from the neck down.  8.  DO NOT shower/wash with your normal soap after using and rinsing off  the CHG Soap.                9.  Pat yourself dry with a clean towel.            10.  Wear clean pajamas.            11.  Place clean sheets on your bed the night of your first shower and do not  sleep with pets. Day of Surgery : Do not apply any lotions/deodorants the morning of surgery.  Please wear clean clothes to the hospital/surgery center.  FAILURE TO FOLLOW THESE INSTRUCTIONS MAY RESULT IN THE CANCELLATION OF YOUR SURGERY PATIENT SIGNATURE_________________________________  NURSE SIGNATURE__________________________________  ________________________________________________________________________   [name]  An incentive spirometer is a tool that can help keep your lungs clear and active. This tool measures how well you are filling your lungs with each breath. Taking long deep breaths may help reverse or decrease the chance of developing breathing (pulmonary) problems (especially infection) following:  A long period of time when you are unable to move or be active. BEFORE THE PROCEDURE   If the spirometer includes an indicator to show your best effort, your nurse or respiratory therapist will set it to a desired goal.  If possible, sit up straight or lean slightly forward. Try not to slouch.  Hold the  incentive spirometer in an upright position. INSTRUCTIONS FOR USE  1. Sit on the edge of your bed if possible, or sit up as far as you can in bed or on a chair. 2. Hold the incentive spirometer in an upright position. 3. Breathe out normally. 4. Place the mouthpiece in your mouth and seal your lips tightly around it. 5. Breathe in slowly and as deeply as possible, raising the piston or the ball toward the top of the column. 6. Hold your breath for 3-5 seconds or for as long as possible. Allow the piston or ball to fall to the bottom of the column. 7. Remove the mouthpiece from your mouth and breathe out normally. 8.  Rest for a few seconds and repeat Steps 1 through 7 at least 10 times every 1-2 hours when you are awake. Take your time and take a few normal breaths between deep breaths. 9. The spirometer may include an indicator to show your best effort. Use the indicator as a goal to work toward during each repetition. 10. After each set of 10 deep breaths, practice coughing to be sure your lungs are clear. If you have an incision (the cut made at the time of surgery), support your incision when coughing by placing a pillow or rolled up towels firmly against it. Once you are able to get out of bed, walk around indoors and cough well. You may stop using the incentive spirometer when instructed by your caregiver.  RISKS AND COMPLICATIONS  Take your time so you do not get dizzy or light-headed.  If you are in pain, you may need to take or ask for pain medication before doing incentive spirometry. It is harder to take a deep breath if you are having pain. AFTER USE  Rest and breathe slowly and easily.  It can be helpful to keep track of a log of your progress. Your caregiver can provide you with a simple table to help with this. If you are using the spirometer at home, follow these instructions: [place] IF:   You are having difficultly using the spirometer.  You have trouble using  the spirometer as often as instructed.  Your pain medication is not giving enough relief while using the spirometer.  You develop fever of 100.5 F (38.1 C) or higher. SEEK IMMEDIATE MEDICAL CARE IF:   You cough up bloody sputum that had not been present before.  You develop fever of 102 F (38.9 C) or greater.  You develop worsening pain at or near the incision site. MAKE SURE YOU:   Understand these instructions.  Will watch your condition.  Will get help right away if you are not doing well or get worse. Document Released: 02/28/2007 Document Revised: 01/10/2012 Document Reviewed: 05/01/2007 [employer] Patient Information 2014 [employer], [place].   ________________________________________________________________________          WHAT IS A BLOOD TRANSFUSION? Blood Transfusion Information  A transfusion is the replacement of blood or some of its parts. Blood is made up of multiple cells which provide different functions.  Red blood cells carry oxygen and are used for blood loss replacement.  White blood cells fight against infection.  Platelets control bleeding.  Plasma helps clot blood.  Other blood products are available for specialized needs, such as hemophilia or other clotting disorders. BEFORE THE TRANSFUSION  Who gives blood for transfusions?   Healthy volunteers who are fully evaluated to make sure their blood is safe. This is blood bank blood. Transfusion therapy is the safest it has ever been in the practice of medicine. Before blood is taken from a donor, a complete history is taken to make sure that person has no history of diseases nor engages in risky social behavior (examples are intravenous drug use or sexual activity with multiple partners). The donor's travel history is screened to minimize risk of transmitting infections, such as malaria. The donated blood is tested for signs of infectious diseases, such as HIV and hepatitis. The blood is then tested to be  sure it is compatible with you in order to minimize the chance of a transfusion reaction. If you or a relative donates blood, this is often done in anticipation of surgery and  is not appropriate for emergency situations. It takes many days to process the donated blood. RISKS AND COMPLICATIONS Although transfusion therapy is very safe and saves many lives, the main dangers of transfusion include:   Getting an infectious disease.  Developing a transfusion reaction. This is an allergic reaction to something in the blood you were given. Every precaution is taken to prevent this. The decision to have a blood transfusion has been considered carefully by your caregiver before blood is given. Blood is not given unless the benefits outweigh the risks. AFTER THE TRANSFUSION  Right after receiving a blood transfusion, you will usually feel much better and more energetic. This is especially true if your red blood cells have gotten low (anemic). The transfusion raises the level of the red blood cells which carry oxygen, and this usually causes an energy increase.  The nurse administering the transfusion will monitor you carefully for complications. HOME CARE INSTRUCTIONS  No special instructions are needed after a transfusion. You may find your energy is better. Speak with your caregiver about any limitations on activity for underlying diseases you may have. SEEK MEDICAL CARE IF:   Your condition is not improving after your transfusion.  You develop redness or irritation at the intravenous (IV) site. SEEK IMMEDIATE MEDICAL CARE IF:  Any of the following symptoms occur over the next 12 hours:  Shaking chills.  You have a temperature by mouth above 102 F (38.9 C), not controlled by medicine.  Chest, back, or muscle pain.  People around you feel you are not acting correctly or are confused.  Shortness of breath or difficulty breathing.  Dizziness and fainting.  You get a rash or develop  hives.  You have a decrease in urine output.  Your urine turns a dark color or changes to pink, red, or brown. Any of the following symptoms occur over the next 10 days:  You have a temperature by mouth above 102 F (38.9 C), not controlled by medicine.  Shortness of breath.  Weakness after normal activity.  The white part of the eye turns yellow (jaundice).  You have a decrease in the amount of urine or are urinating less often.  Your urine turns a dark color or changes to pink, red, or brown. Document Released: 10/15/2000 Document Revised: 01/10/2012 Document Reviewed: 06/03/2008 [facility] Patient Information 2014 [place], [place].  _______________________________________________________________________

## 2018-06-28 ENCOUNTER — Encounter (HOSPITAL_COMMUNITY): Payer: Self-pay

## 2018-06-28 ENCOUNTER — Other Ambulatory Visit: Payer: Self-pay

## 2018-06-28 ENCOUNTER — Encounter (HOSPITAL_COMMUNITY)
Admission: RE | Admit: 2018-06-28 | Discharge: 2018-06-28 | Disposition: A | Discharge status: Home / Self Care | Payer: Medicare Other | Source: Ambulatory Visit | Attending: Surgery | Admitting: Surgery

## 2018-06-28 DIAGNOSIS — Z01812 Encounter for preprocedural laboratory examination: Secondary | ICD-10-CM | POA: Diagnosis not present

## 2018-06-28 DIAGNOSIS — Z01810 Encounter for preprocedural cardiovascular examination: Secondary | ICD-10-CM | POA: Diagnosis not present

## 2018-06-28 DIAGNOSIS — C20 Malignant neoplasm of rectum: Secondary | ICD-10-CM | POA: Diagnosis not present

## 2018-06-28 HISTORY — DX: Dyspnea, unspecified: R06.00

## 2018-06-28 LAB — CBC WITH DIFFERENTIAL/PLATELET
BASOS PCT: 1 %
Basophils Absolute: 0 10*3/uL (ref 0.0–0.1)
EOS ABS: 0.1 10*3/uL (ref 0.0–0.7)
Eosinophils Relative: 2 %
HCT: 41.9 % (ref 36.0–46.0)
HEMOGLOBIN: 14.2 g/dL (ref 12.0–15.0)
Lymphocytes Relative: 13 %
Lymphs Abs: 1 10*3/uL (ref 0.7–4.0)
MCH: 32.8 pg (ref 26.0–34.0)
MCHC: 33.9 g/dL (ref 30.0–36.0)
MCV: 96.8 fL (ref 78.0–100.0)
Monocytes Absolute: 0.6 10*3/uL (ref 0.1–1.0)
Monocytes Relative: 8 %
NEUTROS PCT: 76 %
Neutro Abs: 5.9 10*3/uL (ref 1.7–7.7)
Platelets: 359 10*3/uL (ref 150–400)
RBC: 4.33 MIL/uL (ref 3.87–5.11)
RDW: 16.7 % — ABNORMAL HIGH (ref 11.5–15.5)
WBC: 7.7 10*3/uL (ref 4.0–10.5)

## 2018-06-28 LAB — COMPREHENSIVE METABOLIC PANEL
ALBUMIN: 4.2 g/dL (ref 3.5–5.0)
ALK PHOS: 111 U/L (ref 38–126)
ALT: 13 U/L (ref 0–44)
AST: 22 U/L (ref 15–41)
Anion gap: 9 (ref 5–15)
BUN: 8 mg/dL (ref 8–23)
CALCIUM: 9.5 mg/dL (ref 8.9–10.3)
CO2: 28 mmol/L (ref 22–32)
CREATININE: 0.68 mg/dL (ref 0.44–1.00)
Chloride: 98 mmol/L (ref 98–111)
GFR calc Af Amer: >60 mL/min (ref >60)
GFR calc non Af Amer: >60 mL/min (ref >60)
GLUCOSE: 89 mg/dL (ref 70–99)
Potassium: 4.5 mmol/L (ref 3.5–5.1)
SODIUM: 135 mmol/L (ref 135–145)
Total Bilirubin: 0.6 mg/dL (ref 0.3–1.2)
Total Protein: 7.6 g/dL (ref 6.5–8.1)

## 2018-06-28 LAB — HEMOGLOBIN A1C
Hgb A1c MFr Bld: 5.3 % (ref 4.8–5.6)
Mean Plasma Glucose: 105.41 mg/dL

## 2018-06-28 NOTE — Consult Note (Signed)
[facility] Nurse requested for preoperative stoma site marking  Discussed surgical procedure and stoma creation with patient.  Explained role of the [facility] nurse team.  Provided the patient with educational booklet and provided samples of pouching options.  Answered patient questions. Patient states she is regretful of even starting treatment.  She does not want an ostomy.  Emotional support provided.  Discussed how the ostomy should not impair her active lifestyle.    Examined patient lying, sitting, and standing in order to place the marking in the patient's visual field, away from any creases or abdominal contour issues and within the rectus muscle. Marked for colostomy in the LLQ  5 cm to the left of the umbilicus and 4 cm below the umbilicus.  Marked for ileostomy in the RLQ  5 cm to the right of the umbilicus and  4  cm below the umbilicus.  Scarring in RLQ noted from two previous abdominal surgeries and a midline surgery scar to abdomen as well.   Patient's abdomen cleansed with CHG wipes at site markings, allowed to air dry prior to marking.Covered mark with thin film transparent dressing to preserve mark until date of surgery.   [facility] Nurse team will follow up with patient after surgery for continue ostomy care and teaching.   Domenic Moras RN MSN [facility] Pager [phone]

## 2018-06-29 LAB — CEA: CEA: 4.2 ng/mL (ref 0.0–4.7)

## 2018-07-04 ENCOUNTER — Encounter (HOSPITAL_COMMUNITY): Payer: Self-pay

## 2018-07-04 ENCOUNTER — Ambulatory Visit (HOSPITAL_COMMUNITY): Payer: Medicare Other | Admitting: Anesthesiology

## 2018-07-04 ENCOUNTER — Ambulatory Visit (HOSPITAL_COMMUNITY)
Admission: RE | Admit: 2018-07-04 | Discharge: 2018-07-04 | Disposition: A | Discharge status: Home / Self Care | Payer: Medicare Other | Source: Ambulatory Visit | Attending: Internal Medicine | Admitting: Internal Medicine

## 2018-07-04 ENCOUNTER — Other Ambulatory Visit: Payer: Self-pay

## 2018-07-04 ENCOUNTER — Encounter (HOSPITAL_COMMUNITY)
Admission: RE | Disposition: A | Discharge status: Home / Self Care | Payer: Self-pay | Source: Ambulatory Visit | Attending: Internal Medicine

## 2018-07-04 DIAGNOSIS — Z9221 Personal history of antineoplastic chemotherapy: Secondary | ICD-10-CM | POA: Diagnosis not present

## 2018-07-04 DIAGNOSIS — D124 Benign neoplasm of descending colon: Secondary | ICD-10-CM

## 2018-07-04 DIAGNOSIS — C20 Malignant neoplasm of rectum: Secondary | ICD-10-CM | POA: Diagnosis not present

## 2018-07-04 DIAGNOSIS — Z923 Personal history of irradiation: Secondary | ICD-10-CM | POA: Diagnosis not present

## 2018-07-04 DIAGNOSIS — D123 Benign neoplasm of transverse colon: Secondary | ICD-10-CM | POA: Insufficient documentation

## 2018-07-04 DIAGNOSIS — F329 Major depressive disorder, single episode, unspecified: Secondary | ICD-10-CM | POA: Insufficient documentation

## 2018-07-04 DIAGNOSIS — M8580 Other specified disorders of bone density and structure, unspecified site: Secondary | ICD-10-CM | POA: Diagnosis not present

## 2018-07-04 DIAGNOSIS — F17210 Nicotine dependence, cigarettes, uncomplicated: Secondary | ICD-10-CM | POA: Insufficient documentation

## 2018-07-04 DIAGNOSIS — K6289 Other specified diseases of anus and rectum: Secondary | ICD-10-CM

## 2018-07-04 HISTORY — PX: BIOPSY: SHX5522

## 2018-07-04 HISTORY — PX: COLONOSCOPY WITH PROPOFOL: SHX5780

## 2018-07-04 HISTORY — PX: POLYPECTOMY: SHX5525

## 2018-07-04 SURGERY — COLONOSCOPY WITH PROPOFOL
Anesthesia: Monitor Anesthesia Care

## 2018-07-04 MED ORDER — PROPOFOL 10 MG/ML IV BOLUS: INTRAVENOUS | Status: DC | PRN

## 2018-07-04 MED ORDER — PROPOFOL 500 MG/50ML IV EMUL
INTRAVENOUS | Status: DC | PRN
  Administered 2018-07-04: 120 ug/kg/min via INTRAVENOUS

## 2018-07-04 MED ORDER — LACTATED RINGERS IV SOLN
INTRAVENOUS | Status: DC
  Administered 2018-07-04: 10:00:00 via INTRAVENOUS

## 2018-07-04 MED ORDER — PROPOFOL 10 MG/ML IV BOLUS
INTRAVENOUS | Status: AC
  Filled 2018-07-04: qty 60

## 2018-07-04 MED ORDER — PHENYLEPHRINE 40 MCG/ML (10ML) SYRINGE FOR IV PUSH (FOR BLOOD PRESSURE SUPPORT)
PREFILLED_SYRINGE | INTRAVENOUS | Status: DC | PRN
  Administered 2018-07-04: 80 ug via INTRAVENOUS

## 2018-07-04 MED ORDER — LIDOCAINE 2% (20 MG/ML) 5 ML SYRINGE
INTRAMUSCULAR | Status: DC | PRN
  Administered 2018-07-04: 100 mg via INTRAVENOUS

## 2018-07-04 MED ORDER — PROPOFOL 10 MG/ML IV BOLUS
INTRAVENOUS | Status: DC | PRN
  Administered 2018-07-04 (×2): 20 mg via INTRAVENOUS

## 2018-07-04 SURGICAL SUPPLY — 22 items

## 2018-07-04 NOTE — Op Note (Signed)
[facility] Patient Name: Deborah Arroyo Procedure Date: 07/04/2018 MRN: [id number] Attending MD: Jerene Bears , MD Date of Birth: 18-Aug-1951 CSN: [id number] Age: 67 Admit Type: Outpatient Procedure:                Colonoscopy Indications:              Rectal cancer; incomplete colonoscopy due to                            obstructing rectal cancer on 03/01/2018; now s/p                            Xeloda and radiation therapy; plans for LAR with                            Dr. Dema Severin later this month Providers:                Lajuan Lines. Hilarie Fredrickson, MD, Kingsley Plan, RN, Elspeth Cho Tech., Technician, Danley Danker, CRNA Referring MD:             Sharon Mt. White MD, MD Medicines:                Monitored Anesthesia Care Complications:            No immediate complications. Estimated Blood Loss:     Estimated blood loss was minimal. Procedure:                Pre-Anesthesia Assessment:                           - Prior to the procedure, a History and Physical                            was performed, and patient medications and                            allergies were reviewed. The patient's tolerance of                            previous anesthesia was also reviewed. The risks                            and benefits of the procedure and the sedation                            options and risks were discussed with the patient.                            All questions were answered, and informed consent                            was obtained. Prior Anticoagulants: The patient has  taken no previous anticoagulant or antiplatelet                            agents. ASA Grade Assessment: III - A patient with                            severe systemic disease. After reviewing the risks                            and benefits, the patient was deemed in                            satisfactory condition to undergo the  procedure.                           After obtaining informed consent, the colonoscope                            was passed under direct vision. Throughout the                            procedure, the patient's blood pressure, pulse, and                            oxygen saturations were monitored continuously. The                            PCF-H190DL ([id number]) Olympus peds colonoscope was                            introduced through the anus and advanced to the                            terminal ileum. The colonoscopy was performed                            without difficulty. The patient tolerated the                            procedure well. The quality of the bowel                            preparation was good. The terminal ileum, ileocecal                            valve, appendiceal orifice, and rectum were                            photographed. Scope In: 11:15:49 AM Scope Out: 11:34:30 AM Scope Withdrawal Time: 0 hours 14 minutes 5 seconds  Total Procedure Duration: 0 hours 18 minutes 41 seconds  Findings:      The digital rectal exam was normal.      The terminal ileum appeared normal.      A now non-obstructing large mass  was found in the proximal rectum       (approximately 8 cm from dentate line). The mass was partially       circumferential. The mass measured approximately four cm in length. No       bleeding was present. Distal to this mass was previously placed mucosal       tattooing.      A 10 mm polyp was found in the hepatic flexure. The polyp was sessile.       Polypectomy was attempted, initially using a hot snare. Polyp resection       was incomplete with this device. There was evidence of very small       residual adenomatous tissue at the polypectomy edge. This intervention       then required a different device and polypectomy technique. The residual       polyp was removed with a cold biopsy forceps. Resection and retrieval       were complete.       A 5 mm polyp was found in the descending colon. The polyp was sessile.       The polyp was removed with a cold snare. Resection and retrieval were       complete.      Anal papillae were hypertrophied on retroflexion in the rectum. Impression:               - Normal terminal ileum.                           - Malignant tumor in the proximal rectum. Now                            non-obstructing as described above.                           - One 10 mm polyp at the hepatic flexure, removed                            with a hot snare and a cold biopsy forceps.                            Resected and retrieved.                           - One 5 mm polyp in the descending colon, removed                            with a cold snare. Resected and retrieved.                           - Anal papillae were hypertrophied. Moderate Sedation:      N/A Recommendation:           - Patient has a contact number available for                            emergencies. The signs and symptoms of potential  delayed complications were discussed with the                            patient. Return to normal activities tomorrow.                            Written discharge instructions were provided to the                            patient.                           - Resume previous diet.                           - Continue present medications.                           - Await pathology results.                           - Avoid NSAIDs for at least 2 weeks.                           - Repeat colonoscopy in 1 year for surveillance.                           - Proceed with surgery with Dr. Dema Severin as scheduled. Procedure Code(s):        --- Professional ---                           [phone], Colonoscopy, flexible; with removal of                            tumor(s), polyp(s), or other lesion(s) by snare                            technique                           [id number], 40,  Colonoscopy, flexible; with biopsy,                            single or multiple Diagnosis Code(s):        --- Professional ---                           C20, Malignant neoplasm of rectum                           D12.3, Benign neoplasm of transverse colon (hepatic                            flexure or splenic flexure)                           D12.4, Benign neoplasm of  descending colon                           K62.89, Other specified diseases of anus and rectum CPT copyright 2017 [employer]. All rights reserved. The codes documented in this report are preliminary and upon coder review may  be revised to meet current compliance requirements. Jerene Bears, MD 07/04/2018 11:50:53 AM This report has been signed electronically. Number of Addenda: 0

## 2018-07-04 NOTE — H&P (Signed)
HPI: Deborah Arroyo is a 67 year old female with a history of rectal cancer diagnosed at the time of incomplete colonoscopy in May 2019 now status post chemotherapy with Xeloda and radiation therapy with plans for a low anterior resection with Dr. Dema Severin in the next couple of weeks who presents for colonoscopy.  She had an incomplete colonoscopy due to an obstructing rectosigmoid mass found to be adenocarcinoma on 03/01/2018.  She subsequently was seen by oncology, radiation oncology and surgery.  She has been receiving Xeloda and completed radiation therapy.  Dr. Dema Severin with colorectal surgery plans low anterior resection later this month.  She reports she has been feeling well.  Denies abdominal pain.  Denies diarrhea and constipation.  Denies blood in her stool/melena.  She did have rectal soreness and irritation during her radiation therapy but this has improved and resolved.  Tolerated the bowel prep.  Past Medical History:  Diagnosis Date  . Cancer ([facility]) 03/01/2018   rectal cancer=had chemo pills and radation   . Cataract   . Cervix cancer ([place]) 1979  . Depression   . Dyspnea   . Hypoglycemia   . Hyponatremia    syncope with this and admitted twice due to this issue   . Osteopenia   . Rectal bleeding   . Smoker     Past Surgical History:  Procedure Laterality Date  . ABDOMINAL HYSTERECTOMY    . APPENDECTOMY    . CHOLECYSTECTOMY    . COLONOSCOPY  03/01/2018   Dr.Pyrtle  . EYE SURGERY    . SHOULDER SURGERY Left    Rotary Cuff Tear  . TUBAL LIGATION       (Not in an outpatient encounter)  Allergies  Allergen Reactions  . Aleve [Naproxen Sodium] Other (See Comments)    Eyes and throat swell up    Family History  Problem Relation Age of Onset  . Stomach cancer Sister   . Ovarian cancer Sister   . Cancer - Ovarian Sister   . Dementia Mother   . Uterine cancer Mother   . Endometrial cancer Mother   . Prostate cancer Brother   . Cancer - Prostate Brother   . Cancer  Other   . Cancer Brother        lung/brain  . Breast cancer Daughter   . Alzheimer's disease Father   . Colon cancer Neg Hx   . Esophageal cancer Neg Hx   . Pancreatic cancer Neg Hx   . Rectal cancer Neg Hx   . Colon polyps Neg Hx     Social History   Tobacco Use  . Smoking status: Current Every Day Smoker    Packs/day: 1.50    Types: Cigarettes  . Smokeless tobacco: Never Used  Substance Use Topics  . Alcohol use: No    Alcohol/week: 0.0 standard drinks  . Drug use: No    ROS: As per history of present illness, otherwise negative  BP 117/67   Pulse 79   Temp 97.7 F (36.5 C) (Oral)   Resp 17   Ht 5\' 1"  (1.549 m)   Wt 51.7 kg   SpO2 100%   BMI 21.54 kg/m  Gen: awake, alert, NAD HEENT: anicteric, op clear CV: RRR, no mrg Pulm: CTA b/l Abd: soft, NT/ND, +BS throughout Ext: no c/c/e Neuro: nonfocal   RELEVANT LABS AND IMAGING: CBC    Component Value Date/Time   WBC 7.7 06/28/2018 1156   RBC 4.33 06/28/2018 1156   HGB 14.2 06/28/2018 1156  HGB 12.8 04/28/2018 1407   HGB 14.7 12/14/2017 1649   HCT 41.9 06/28/2018 1156   HCT 42.3 12/14/2017 1649   PLT 359 06/28/2018 1156   PLT 225 04/28/2018 1407   PLT 431 (H) 12/14/2017 1649   MCV 96.8 06/28/2018 1156   MCV 90 12/14/2017 1649   MCH 32.8 06/28/2018 1156   MCHC 33.9 06/28/2018 1156   RDW 16.7 (H) 06/28/2018 1156   RDW 12.9 12/14/2017 1649   LYMPHSABS 1.0 06/28/2018 1156   MONOABS 0.6 06/28/2018 1156   EOSABS 0.1 06/28/2018 1156   BASOSABS 0.0 06/28/2018 1156    CMP     Component Value Date/Time   NA 135 06/28/2018 1156   NA 135 08/24/2017 1531   K 4.5 06/28/2018 1156   CL 98 06/28/2018 1156   CO2 28 06/28/2018 1156   GLUCOSE 89 06/28/2018 1156   GLUCOSE 95 03/28/2014 1224   BUN 8 06/28/2018 1156   BUN 9 08/24/2017 1531   CREATININE 0.68 06/28/2018 1156   CREATININE 0.71 04/14/2018 1117   CALCIUM 9.5 06/28/2018 1156   PROT 7.6 06/28/2018 1156   PROT 6.9 08/24/2017 1531   ALBUMIN 4.2  06/28/2018 1156   ALBUMIN 4.4 08/24/2017 1531   AST 22 06/28/2018 1156   AST 13 04/14/2018 1117   ALT 13 06/28/2018 1156   ALT <6 04/14/2018 1117   ALKPHOS 111 06/28/2018 1156   BILITOT 0.6 06/28/2018 1156   BILITOT 0.3 04/14/2018 1117   GFRNONAA >60 06/28/2018 1156   GFRNONAA >60 04/14/2018 1117   GFRAA >60 06/28/2018 1156   GFRAA >60 04/14/2018 1117    ASSESSMENT/PLAN: 67 year old female with a history of rectal cancer diagnosed at the time of incomplete colonoscopy in May 2019 now status post chemotherapy with Xeloda and radiation therapy with plans for a low anterior resection with Dr. Dema Severin in the next couple of weeks who presents for colonoscopy.  1. Rectal cancer --history of rectal cancer, causing near obstruction diagnosed in May 2019.  MRI pelvis stage T3c, N2.  Plan for repeat colonoscopy, hopefully to examine the remaining colon prior to upcoming low anterior resection with Dr. Dema Severin.  The nature of the procedure, as well as the risks, benefits, and alternatives were carefully and thoroughly reviewed with the patient. Ample time for discussion and questions allowed. The patient understood, was satisfied, and agreed to proceed.

## 2018-07-04 NOTE — Anesthesia Procedure Notes (Signed)
Date/Time: 07/04/2018 11:07 AM Performed by: Sharlette Dense, CRNA Oxygen Delivery Method: Simple face mask

## 2018-07-04 NOTE — Anesthesia Postprocedure Evaluation (Signed)
Anesthesia Post Note  Patient: PAXTON KANAAN  Procedure(s) Performed: COLONOSCOPY WITH PROPOFOL (N/A ) BIOPSY POLYPECTOMY     Patient location during evaluation: PACU Anesthesia Type: MAC Level of consciousness: awake and alert Pain management: pain level controlled Vital Signs Assessment: post-procedure vital signs reviewed and stable Respiratory status: spontaneous breathing, nonlabored ventilation, respiratory function stable and patient connected to nasal cannula oxygen Cardiovascular status: stable and blood pressure returned to baseline Postop Assessment: no apparent nausea or vomiting Anesthetic complications: no    Last Vitals:  Vitals:   07/04/18 1205 07/04/18 1210  BP: 130/73 119/64  Pulse: 73 78  Resp: 18 (!) 21  Temp:    SpO2: 96% 95%    Last Pain:  Vitals:   07/04/18 1210  TempSrc:   PainSc: 0-No pain                 Anju Sereno P Teegan Brandis

## 2018-07-04 NOTE — Transfer of Care (Signed)
Immediate Anesthesia Transfer of Care Note  Patient: Deborah Arroyo  Procedure(s) Performed: COLONOSCOPY WITH PROPOFOL (N/A ) BIOPSY POLYPECTOMY  Patient Location: Endoscopy Unit  Anesthesia Type:MAC  Level of Consciousness: awake  Airway & Oxygen Therapy: Patient Spontanous Breathing and Patient connected to face mask oxygen  Post-op Assessment: Report given to RN and Post -op Vital signs reviewed and stable  Post vital signs: Reviewed and stable  Last Vitals:  Vitals Value Taken Time  BP    Temp    Pulse    Resp    SpO2      Last Pain:  Vitals:   07/04/18 1006  TempSrc: Oral  PainSc: 0-No pain         Complications: No apparent anesthesia complications

## 2018-07-04 NOTE — Discharge Instructions (Signed)
YOU HAD AN ENDOSCOPIC PROCEDURE TODAY: Refer to the procedure report and other information in the discharge instructions given to you for any specific questions about what was found during the examination. If this information does not answer your questions, please call [place] office at [phone] to clarify.  ° °YOU SHOULD EXPECT: Some feelings of bloating in the abdomen. Passage of more gas than usual. Walking can help get rid of the air that was put into your GI tract during the procedure and reduce the bloating. If you had a lower endoscopy (such as a colonoscopy or flexible sigmoidoscopy) you may notice spotting of blood in your stool or on the toilet paper. Some abdominal soreness may be present for a day or two, also. ° °DIET: Your first meal following the procedure should be a light meal and then it is ok to progress to your normal diet. A half-sandwich or bowl of soup is an example of a good first meal. Heavy or fried foods are harder to digest and may make you feel nauseous or bloated. Drink plenty of fluids but you should avoid alcoholic beverages for 24 hours. If you had a esophageal dilation, please see attached instructions for diet.   ° °ACTIVITY: Your care partner should take you home directly after the procedure. You should plan to take it easy, moving slowly for the rest of the day. You can resume normal activity the day after the procedure however YOU SHOULD NOT DRIVE, use power tools, machinery or perform tasks that involve climbing or major physical exertion for 24 hours (because of the sedation medicines used during the test).  ° °SYMPTOMS TO REPORT IMMEDIATELY: °A gastroenterologist can be reached at any hour. Please call [phone]  for any of the following symptoms:  °Following lower endoscopy (colonoscopy, flexible sigmoidoscopy) °Excessive amounts of blood in the stool  °Significant tenderness, worsening of abdominal pains  °Swelling of the abdomen that is new, acute  °Fever of 100° or  higher  °Following upper endoscopy (EGD, EUS, ERCP, esophageal dilation) °Vomiting of blood or coffee ground material  °New, significant abdominal pain  °New, significant chest pain or pain under the shoulder blades  °Painful or persistently difficult swallowing  °New shortness of breath  °Black, tarry-looking or red, bloody stools ° °FOLLOW UP:  °If any biopsies were taken you will be contacted by phone or by letter within the next 1-3 weeks. Call [phone]  if you have not heard about the biopsies in 3 weeks.  °Please also call with any specific questions about appointments or follow up tests. ° °

## 2018-07-04 NOTE — Anesthesia Preprocedure Evaluation (Addendum)
Anesthesia Evaluation  Patient identified by MRN, date of birth, ID band Patient awake    Reviewed: Allergy & Precautions, NPO status , Patient's Chart, lab work & pertinent test results  Airway Mallampati: II  TM Distance: >3 FB Neck ROM: Full    Dental no notable dental hx.    Pulmonary Current Smoker,    Pulmonary exam normal breath sounds clear to auscultation       Cardiovascular negative cardio ROS Normal cardiovascular exam Rhythm:Regular Rate:Normal  ECG: NSR, rate 63   Neuro/Psych  Headaches, PSYCHIATRIC DISORDERS Depression    GI/Hepatic negative GI ROS, Neg liver ROS,   Endo/Other  negative endocrine ROS  Renal/GU negative Renal ROS     Musculoskeletal negative musculoskeletal ROS (+)   Abdominal   Peds  Hematology negative hematology ROS (+)   Anesthesia Other Findings History of colon cancer/incomplete colon/jmh  Reproductive/Obstetrics                            Anesthesia Physical Anesthesia Plan  ASA: II  Anesthesia Plan: MAC   Post-op Pain Management:    Induction: Intravenous  PONV Risk Score and Plan: 1 and Propofol infusion and Treatment may vary due to age or medical condition  Airway Management Planned: Natural Airway  Additional Equipment:   Intra-op Plan:   Post-operative Plan:   Informed Consent: I have reviewed the patients History and Physical, chart, labs and discussed the procedure including the risks, benefits and alternatives for the proposed anesthesia with the patient or authorized representative who has indicated his/her understanding and acceptance.   Dental advisory given  Plan Discussed with: CRNA  Anesthesia Plan Comments:         Anesthesia Quick Evaluation

## 2018-07-05 ENCOUNTER — Encounter (HOSPITAL_COMMUNITY): Payer: Self-pay | Admitting: Internal Medicine

## 2018-07-07 ENCOUNTER — Encounter: Payer: Self-pay | Admitting: Internal Medicine

## 2018-07-07 DIAGNOSIS — C20 Malignant neoplasm of rectum: Secondary | ICD-10-CM | POA: Diagnosis not present

## 2018-07-11 MED ORDER — BUPIVACAINE LIPOSOME 1.3 % IJ SUSP
20.0000 mL | Freq: Once | INTRAMUSCULAR | Status: DC
  Filled 2018-07-11: qty 20

## 2018-07-12 ENCOUNTER — Inpatient Hospital Stay (HOSPITAL_COMMUNITY): Payer: Medicare Other | Admitting: Certified Registered Nurse Anesthetist

## 2018-07-12 ENCOUNTER — Inpatient Hospital Stay (HOSPITAL_COMMUNITY): Payer: Medicare Other

## 2018-07-12 ENCOUNTER — Other Ambulatory Visit: Payer: Self-pay

## 2018-07-12 ENCOUNTER — Encounter (HOSPITAL_COMMUNITY)
Admission: RE | Disposition: A | Discharge status: Home Health | Payer: Self-pay | Source: Ambulatory Visit | Attending: Surgery

## 2018-07-12 ENCOUNTER — Encounter (HOSPITAL_COMMUNITY): Payer: Self-pay | Admitting: Certified Registered Nurse Anesthetist

## 2018-07-12 ENCOUNTER — Inpatient Hospital Stay (HOSPITAL_COMMUNITY)
Admission: RE | Admit: 2018-07-12 | Discharge: 2018-07-22 | DRG: 330 | Disposition: A | Discharge status: Home Health | Payer: Medicare Other | Source: Ambulatory Visit | Attending: Surgery | Admitting: Surgery

## 2018-07-12 DIAGNOSIS — Z923 Personal history of irradiation: Secondary | ICD-10-CM

## 2018-07-12 DIAGNOSIS — E876 Hypokalemia: Secondary | ICD-10-CM

## 2018-07-12 DIAGNOSIS — F17210 Nicotine dependence, cigarettes, uncomplicated: Secondary | ICD-10-CM | POA: Diagnosis present

## 2018-07-12 DIAGNOSIS — F419 Anxiety disorder, unspecified: Secondary | ICD-10-CM | POA: Diagnosis present

## 2018-07-12 DIAGNOSIS — C218 Malignant neoplasm of overlapping sites of rectum, anus and anal canal: Secondary | ICD-10-CM | POA: Diagnosis not present

## 2018-07-12 DIAGNOSIS — F329 Major depressive disorder, single episode, unspecified: Secondary | ICD-10-CM | POA: Diagnosis present

## 2018-07-12 DIAGNOSIS — K660 Peritoneal adhesions (postprocedural) (postinfection): Secondary | ICD-10-CM | POA: Diagnosis not present

## 2018-07-12 DIAGNOSIS — R059 Cough, unspecified: Secondary | ICD-10-CM

## 2018-07-12 DIAGNOSIS — K435 Parastomal hernia without obstruction or  gangrene: Secondary | ICD-10-CM | POA: Diagnosis not present

## 2018-07-12 DIAGNOSIS — Z932 Ileostomy status: Secondary | ICD-10-CM

## 2018-07-12 DIAGNOSIS — R05 Cough: Secondary | ICD-10-CM

## 2018-07-12 DIAGNOSIS — M8580 Other specified disorders of bone density and structure, unspecified site: Secondary | ICD-10-CM | POA: Diagnosis present

## 2018-07-12 DIAGNOSIS — Z8541 Personal history of malignant neoplasm of cervix uteri: Secondary | ICD-10-CM

## 2018-07-12 DIAGNOSIS — Z79899 Other long term (current) drug therapy: Secondary | ICD-10-CM | POA: Diagnosis not present

## 2018-07-12 DIAGNOSIS — Z720 Tobacco use: Secondary | ICD-10-CM | POA: Diagnosis present

## 2018-07-12 DIAGNOSIS — Z9981 Dependence on supplemental oxygen: Secondary | ICD-10-CM

## 2018-07-12 DIAGNOSIS — R0902 Hypoxemia: Secondary | ICD-10-CM

## 2018-07-12 DIAGNOSIS — Z9221 Personal history of antineoplastic chemotherapy: Secondary | ICD-10-CM

## 2018-07-12 DIAGNOSIS — K921 Melena: Secondary | ICD-10-CM | POA: Diagnosis not present

## 2018-07-12 DIAGNOSIS — C20 Malignant neoplasm of rectum: Secondary | ICD-10-CM | POA: Diagnosis present

## 2018-07-12 DIAGNOSIS — C19 Malignant neoplasm of rectosigmoid junction: Secondary | ICD-10-CM | POA: Diagnosis not present

## 2018-07-12 DIAGNOSIS — R159 Full incontinence of feces: Secondary | ICD-10-CM | POA: Diagnosis present

## 2018-07-12 DIAGNOSIS — Z888 Allergy status to other drugs, medicaments and biological substances status: Secondary | ICD-10-CM | POA: Diagnosis not present

## 2018-07-12 DIAGNOSIS — D123 Benign neoplasm of transverse colon: Secondary | ICD-10-CM | POA: Diagnosis not present

## 2018-07-12 DIAGNOSIS — Z408 Encounter for other prophylactic surgery: Secondary | ICD-10-CM | POA: Diagnosis not present

## 2018-07-12 DIAGNOSIS — D124 Benign neoplasm of descending colon: Secondary | ICD-10-CM | POA: Diagnosis not present

## 2018-07-12 DIAGNOSIS — R198 Other specified symptoms and signs involving the digestive system and abdomen: Secondary | ICD-10-CM

## 2018-07-12 HISTORY — PX: DIVERTING ILEOSTOMY: SHX5799

## 2018-07-12 HISTORY — PX: CYSTOSCOPY WITH STENT PLACEMENT: SHX5790

## 2018-07-12 HISTORY — PX: LAPAROSCOPIC LOW ANTERIOR RESECTION: SHX5904

## 2018-07-12 HISTORY — PX: FLEXIBLE SIGMOIDOSCOPY: SHX5431

## 2018-07-12 LAB — TYPE AND SCREEN
ABO/RH(D): A POS
Antibody Screen: NEGATIVE

## 2018-07-12 LAB — ABO/RH: ABO/RH(D): A POS

## 2018-07-12 SURGERY — RESECTION, RECTUM, LOW ANTERIOR, LAPAROSCOPIC
Anesthesia: General

## 2018-07-12 MED ORDER — LIDOCAINE 2% (20 MG/ML) 5 ML SYRINGE
INTRAMUSCULAR | Status: DC | PRN
  Administered 2018-07-12: 1 mg/kg/h via INTRAVENOUS

## 2018-07-12 MED ORDER — 0.9 % SODIUM CHLORIDE (POUR BTL) OPTIME
TOPICAL | Status: DC | PRN
  Administered 2018-07-12: 1000 mL

## 2018-07-12 MED ORDER — HYDROMORPHONE HCL 1 MG/ML IJ SOLN
0.5000 mg | INTRAMUSCULAR | Status: DC | PRN
  Administered 2018-07-12 – 2018-07-20 (×17): 0.5 mg via INTRAVENOUS
  Filled 2018-07-12 (×18): qty 0.5

## 2018-07-12 MED ORDER — ALBUMIN HUMAN 5 % IV SOLN
INTRAVENOUS | Status: DC | PRN
  Administered 2018-07-12 (×2): via INTRAVENOUS

## 2018-07-12 MED ORDER — SODIUM CHLORIDE 0.9 % IJ SOLN
INTRAMUSCULAR | Status: DC | PRN
  Administered 2018-07-12: 15 mL

## 2018-07-12 MED ORDER — DIPHENHYDRAMINE HCL 50 MG/ML IJ SOLN
12.5000 mg | Freq: Four times a day (QID) | INTRAMUSCULAR | Status: DC | PRN
  Administered 2018-07-14 – 2018-07-16 (×2): 12.5 mg via INTRAVENOUS
  Filled 2018-07-12 (×2): qty 1

## 2018-07-12 MED ORDER — IOPAMIDOL (ISOVUE-300) INJECTION 61%
INTRAVENOUS | Status: AC
  Filled 2018-07-12: qty 50

## 2018-07-12 MED ORDER — SODIUM CHLORIDE 0.9 % IV SOLN
INTRAVENOUS | Status: DC | PRN
  Administered 2018-07-12: 15 mL

## 2018-07-12 MED ORDER — ROCURONIUM BROMIDE 10 MG/ML (PF) SYRINGE
PREFILLED_SYRINGE | INTRAVENOUS | Status: AC
  Filled 2018-07-12: qty 10

## 2018-07-12 MED ORDER — NEOMYCIN SULFATE 500 MG PO TABS: 1000.0000 mg | ORAL_TABLET | ORAL | Status: DC

## 2018-07-12 MED ORDER — MIDAZOLAM HCL 2 MG/2ML IJ SOLN
INTRAMUSCULAR | Status: AC
  Filled 2018-07-12: qty 2

## 2018-07-12 MED ORDER — ONDANSETRON HCL 4 MG PO TABS: 4.0000 mg | ORAL_TABLET | Freq: Four times a day (QID) | ORAL | Status: DC | PRN

## 2018-07-12 MED ORDER — TRAMADOL HCL 50 MG PO TABS
50.0000 mg | ORAL_TABLET | Freq: Four times a day (QID) | ORAL | Status: DC | PRN
  Administered 2018-07-13 – 2018-07-21 (×18): 50 mg via ORAL
  Filled 2018-07-12 (×20): qty 1

## 2018-07-12 MED ORDER — EPHEDRINE SULFATE-NACL 50-0.9 MG/10ML-% IV SOSY
PREFILLED_SYRINGE | INTRAVENOUS | Status: DC | PRN
  Administered 2018-07-12: 15 mg via INTRAVENOUS

## 2018-07-12 MED ORDER — HYDRALAZINE HCL 20 MG/ML IJ SOLN: 10.0000 mg | INTRAMUSCULAR | Status: DC | PRN

## 2018-07-12 MED ORDER — HYDROMORPHONE HCL 1 MG/ML IJ SOLN: 0.2500 mg | INTRAMUSCULAR | Status: DC | PRN

## 2018-07-12 MED ORDER — KETAMINE HCL 10 MG/ML IJ SOLN
INTRAMUSCULAR | Status: AC
  Filled 2018-07-12: qty 1

## 2018-07-12 MED ORDER — LACTATED RINGERS IV SOLN
INTRAVENOUS | Status: DC
  Administered 2018-07-13: 01:00:00 via INTRAVENOUS
  Administered 2018-07-13: 75 mL/h via INTRAVENOUS

## 2018-07-12 MED ORDER — ENSURE SURGERY PO LIQD
237.0000 mL | Freq: Two times a day (BID) | ORAL | Status: DC
  Administered 2018-07-12 – 2018-07-22 (×12): 237 mL via ORAL
  Filled 2018-07-12 (×21): qty 237

## 2018-07-12 MED ORDER — LACTATED RINGERS IV SOLN
INTRAVENOUS | Status: DC
  Administered 2018-07-12 (×2): 1000 mL via INTRAVENOUS
  Administered 2018-07-12: 10:00:00 via INTRAVENOUS

## 2018-07-12 MED ORDER — SUGAMMADEX SODIUM 200 MG/2ML IV SOLN
INTRAVENOUS | Status: AC
  Filled 2018-07-12: qty 2

## 2018-07-12 MED ORDER — ALVIMOPAN 12 MG PO CAPS
12.0000 mg | ORAL_CAPSULE | ORAL | Status: AC
  Administered 2018-07-12: 12 mg via ORAL
  Filled 2018-07-12: qty 1

## 2018-07-12 MED ORDER — ACETAMINOPHEN 500 MG PO TABS
1000.0000 mg | ORAL_TABLET | Freq: Four times a day (QID) | ORAL | Status: AC
  Administered 2018-07-12 – 2018-07-19 (×21): 1000 mg via ORAL
  Filled 2018-07-12 (×24): qty 2

## 2018-07-12 MED ORDER — ACETAMINOPHEN 500 MG PO TABS
1000.0000 mg | ORAL_TABLET | ORAL | Status: AC
  Administered 2018-07-12: 1000 mg via ORAL
  Filled 2018-07-12: qty 2

## 2018-07-12 MED ORDER — LIDOCAINE 2% (20 MG/ML) 5 ML SYRINGE
INTRAMUSCULAR | Status: AC
  Filled 2018-07-12: qty 5

## 2018-07-12 MED ORDER — SACCHAROMYCES BOULARDII 250 MG PO CAPS
250.0000 mg | ORAL_CAPSULE | Freq: Two times a day (BID) | ORAL | Status: DC
  Administered 2018-07-12 – 2018-07-22 (×18): 250 mg via ORAL
  Filled 2018-07-12 (×19): qty 1

## 2018-07-12 MED ORDER — KETAMINE HCL 10 MG/ML IJ SOLN
INTRAMUSCULAR | Status: DC | PRN
  Administered 2018-07-12: 20 mg via INTRAVENOUS

## 2018-07-12 MED ORDER — PROPOFOL 10 MG/ML IV BOLUS
INTRAVENOUS | Status: DC | PRN
  Administered 2018-07-12: 140 mg via INTRAVENOUS

## 2018-07-12 MED ORDER — PROPOFOL 10 MG/ML IV BOLUS
INTRAVENOUS | Status: AC
  Filled 2018-07-12: qty 20

## 2018-07-12 MED ORDER — HEPARIN SODIUM (PORCINE) 5000 UNIT/ML IJ SOLN
5000.0000 [IU] | Freq: Once | INTRAMUSCULAR | Status: AC
  Administered 2018-07-12: 5000 [IU] via SUBCUTANEOUS
  Filled 2018-07-12: qty 1

## 2018-07-12 MED ORDER — SUGAMMADEX SODIUM 200 MG/2ML IV SOLN
INTRAVENOUS | Status: DC | PRN
  Administered 2018-07-12: 150 mg via INTRAVENOUS

## 2018-07-12 MED ORDER — BUPIVACAINE-EPINEPHRINE (PF) 0.5% -1:200000 IJ SOLN
INTRAMUSCULAR | Status: AC
  Filled 2018-07-12: qty 30

## 2018-07-12 MED ORDER — SODIUM CHLORIDE 0.9 % IV SOLN
2.0000 g | INTRAVENOUS | Status: AC
  Administered 2018-07-12: 2 g via INTRAVENOUS
  Filled 2018-07-12: qty 2

## 2018-07-12 MED ORDER — SODIUM CHLORIDE 0.9 % IJ SOLN
INTRAMUSCULAR | Status: AC
  Filled 2018-07-12: qty 20

## 2018-07-12 MED ORDER — PNEUMOCOCCAL VAC POLYVALENT 25 MCG/0.5ML IJ INJ
0.5000 mL | INJECTION | INTRAMUSCULAR | Status: DC
  Filled 2018-07-12: qty 0.5

## 2018-07-12 MED ORDER — 0.9 % SODIUM CHLORIDE (POUR BTL) OPTIME
TOPICAL | Status: DC | PRN
  Administered 2018-07-12: 2000 mL

## 2018-07-12 MED ORDER — METRONIDAZOLE 500 MG PO TABS: 1000.0000 mg | ORAL_TABLET | ORAL | Status: DC

## 2018-07-12 MED ORDER — HEPARIN SODIUM (PORCINE) 5000 UNIT/ML IJ SOLN
5000.0000 [IU] | Freq: Three times a day (TID) | INTRAMUSCULAR | Status: DC
  Administered 2018-07-12 – 2018-07-22 (×28): 5000 [IU] via SUBCUTANEOUS
  Filled 2018-07-12 (×29): qty 1

## 2018-07-12 MED ORDER — LACTATED RINGERS IR SOLN
Status: DC | PRN
  Administered 2018-07-12: 1000 mL

## 2018-07-12 MED ORDER — MIDAZOLAM HCL 5 MG/5ML IJ SOLN
INTRAMUSCULAR | Status: DC | PRN
  Administered 2018-07-12: 2 mg via INTRAVENOUS

## 2018-07-12 MED ORDER — FENTANYL CITRATE (PF) 250 MCG/5ML IJ SOLN
INTRAMUSCULAR | Status: AC
  Filled 2018-07-12: qty 5

## 2018-07-12 MED ORDER — ONDANSETRON HCL 4 MG/2ML IJ SOLN
INTRAMUSCULAR | Status: DC | PRN
  Administered 2018-07-12 (×2): 4 mg via INTRAVENOUS

## 2018-07-12 MED ORDER — LIDOCAINE 2% (20 MG/ML) 5 ML SYRINGE
INTRAMUSCULAR | Status: DC | PRN
  Administered 2018-07-12: 60 mg via INTRAVENOUS

## 2018-07-12 MED ORDER — LIDOCAINE HCL 2 % IJ SOLN
INTRAMUSCULAR | Status: AC
  Filled 2018-07-12: qty 20

## 2018-07-12 MED ORDER — CHLORHEXIDINE GLUCONATE CLOTH 2 % EX PADS: 6.0000 | MEDICATED_PAD | Freq: Once | CUTANEOUS | Status: DC

## 2018-07-12 MED ORDER — PHENYLEPHRINE 40 MCG/ML (10ML) SYRINGE FOR IV PUSH (FOR BLOOD PRESSURE SUPPORT)
PREFILLED_SYRINGE | INTRAVENOUS | Status: DC | PRN
  Administered 2018-07-12 (×2): 80 ug via INTRAVENOUS
  Administered 2018-07-12: 120 ug via INTRAVENOUS

## 2018-07-12 MED ORDER — DIPHENHYDRAMINE HCL 12.5 MG/5ML PO ELIX: 12.5000 mg | ORAL_SOLUTION | Freq: Four times a day (QID) | ORAL | Status: DC | PRN

## 2018-07-12 MED ORDER — BUPIVACAINE LIPOSOME 1.3 % IJ SUSP
INTRAMUSCULAR | Status: DC | PRN
  Administered 2018-07-12: 20 mL

## 2018-07-12 MED ORDER — INFLUENZA VAC SPLIT HIGH-DOSE 0.5 ML IM SUSY
0.5000 mL | PREFILLED_SYRINGE | INTRAMUSCULAR | Status: DC
  Filled 2018-07-12: qty 0.5

## 2018-07-12 MED ORDER — INDIGOTINDISULFONATE SODIUM 8 MG/ML IJ SOLN
INTRAMUSCULAR | Status: AC
  Filled 2018-07-12: qty 5

## 2018-07-12 MED ORDER — POLYETHYLENE GLYCOL 3350 17 GM/SCOOP PO POWD: 1.0000 | Freq: Once | ORAL | Status: DC

## 2018-07-12 MED ORDER — SODIUM CHLORIDE 0.9 % IV SOLN
INTRAVENOUS | Status: DC | PRN
  Administered 2018-07-12: 50 ug/min via INTRAVENOUS

## 2018-07-12 MED ORDER — ALBUMIN HUMAN 5 % IV SOLN
INTRAVENOUS | Status: AC
  Filled 2018-07-12: qty 250

## 2018-07-12 MED ORDER — ROCURONIUM BROMIDE 10 MG/ML (PF) SYRINGE
PREFILLED_SYRINGE | INTRAVENOUS | Status: DC | PRN
  Administered 2018-07-12: 20 mg via INTRAVENOUS
  Administered 2018-07-12: 50 mg via INTRAVENOUS
  Administered 2018-07-12: 20 mg via INTRAVENOUS

## 2018-07-12 MED ORDER — GLYCOPYRROLATE PF 0.2 MG/ML IJ SOSY
PREFILLED_SYRINGE | INTRAMUSCULAR | Status: DC | PRN
  Administered 2018-07-12: .1 mg via INTRAVENOUS

## 2018-07-12 MED ORDER — SUCCINYLCHOLINE CHLORIDE 200 MG/10ML IV SOSY
PREFILLED_SYRINGE | INTRAVENOUS | Status: DC | PRN
  Administered 2018-07-12: 120 mg via INTRAVENOUS

## 2018-07-12 MED ORDER — GABAPENTIN 300 MG PO CAPS
300.0000 mg | ORAL_CAPSULE | ORAL | Status: AC
  Administered 2018-07-12: 300 mg via ORAL
  Filled 2018-07-12: qty 1

## 2018-07-12 MED ORDER — BUPIVACAINE-EPINEPHRINE 0.5% -1:200000 IJ SOLN
INTRAMUSCULAR | Status: DC | PRN
  Administered 2018-07-12: 15 mL

## 2018-07-12 MED ORDER — DEXAMETHASONE SODIUM PHOSPHATE 10 MG/ML IJ SOLN
INTRAMUSCULAR | Status: DC | PRN
  Administered 2018-07-12: 10 mg via INTRAVENOUS

## 2018-07-12 MED ORDER — FENTANYL CITRATE (PF) 250 MCG/5ML IJ SOLN
INTRAMUSCULAR | Status: DC | PRN
  Administered 2018-07-12: 150 ug via INTRAVENOUS
  Administered 2018-07-12 (×2): 50 ug via INTRAVENOUS

## 2018-07-12 MED ORDER — ALUM & MAG HYDROXIDE-SIMETH 200-200-20 MG/5ML PO SUSP
30.0000 mL | Freq: Four times a day (QID) | ORAL | Status: DC | PRN
  Administered 2018-07-16: 30 mL via ORAL
  Filled 2018-07-12: qty 30

## 2018-07-12 MED ORDER — ALVIMOPAN 12 MG PO CAPS
12.0000 mg | ORAL_CAPSULE | Freq: Two times a day (BID) | ORAL | Status: DC
  Administered 2018-07-13 – 2018-07-15 (×4): 12 mg via ORAL
  Filled 2018-07-12 (×5): qty 1

## 2018-07-12 MED ORDER — ONDANSETRON HCL 4 MG/2ML IJ SOLN
4.0000 mg | Freq: Four times a day (QID) | INTRAMUSCULAR | Status: DC | PRN
  Administered 2018-07-13 – 2018-07-18 (×8): 4 mg via INTRAVENOUS
  Filled 2018-07-12 (×8): qty 2

## 2018-07-12 SURGICAL SUPPLY — 102 items
ADAPTER GOLDBERG URETERAL (ADAPTER) ×2 IMPLANT
ADH SKN CLS APL DERMABOND .7 (GAUZE/BANDAGES/DRESSINGS) ×2
ADPR CATH 15X14FR FL DRN BG (ADAPTER) ×2
APPLIER CLIP 5 13 M/L LIGAMAX5 (MISCELLANEOUS)
APPLIER CLIP ROT 10 11.4 M/L (STAPLE)
APR CLP MED LRG 11.4X10 (STAPLE)
APR CLP MED LRG 5 ANG JAW (MISCELLANEOUS)
BAG URO CATCHER STRL LF (MISCELLANEOUS) ×4 IMPLANT
BARRIER SKIN 2 1/4 (WOUND CARE) ×3 IMPLANT
BARRIER SKIN 2 1/4INCH (WOUND CARE) ×1
BARRIER SKIN OD1.75 2 1/4 FLNG (WOUND CARE) IMPLANT
BLADE EXTENDED COATED 6.5IN (ELECTRODE) IMPLANT
BRR SKN FLT 1.75X2.25 2 PC (WOUND CARE) ×2
CABLE HIGH FREQUENCY MONO STRZ (ELECTRODE) ×4 IMPLANT
CATH INTERMIT  6FR 70CM (CATHETERS) ×6 IMPLANT
CATH ROBINSON RED A/P 18FR (CATHETERS) ×2 IMPLANT
CELLS DAT CNTRL 66122 CELL SVR (MISCELLANEOUS) IMPLANT
CLIP APPLIE 5 13 M/L LIGAMAX5 (MISCELLANEOUS) IMPLANT
CLIP APPLIE ROT 10 11.4 M/L (STAPLE) IMPLANT
CLOTH BEACON ORANGE TIMEOUT ST (SAFETY) ×4 IMPLANT
COVER SURGICAL LIGHT HANDLE (MISCELLANEOUS) ×2 IMPLANT
DECANTER SPIKE VIAL GLASS SM (MISCELLANEOUS) ×4 IMPLANT
DERMABOND ADVANCED (GAUZE/BANDAGES/DRESSINGS) ×2
DERMABOND ADVANCED .7 DNX12 (GAUZE/BANDAGES/DRESSINGS) IMPLANT
DISSECTOR BLUNT TIP ENDO 5MM (MISCELLANEOUS) IMPLANT
DRAIN CHANNEL 19F RND (DRAIN) ×2 IMPLANT
DRAPE SURG IRRIG POUCH 19X23 (DRAPES) ×4 IMPLANT
DRSG OPSITE POSTOP 4X10 (GAUZE/BANDAGES/DRESSINGS) IMPLANT
DRSG OPSITE POSTOP 4X6 (GAUZE/BANDAGES/DRESSINGS) ×2 IMPLANT
DRSG OPSITE POSTOP 4X8 (GAUZE/BANDAGES/DRESSINGS) IMPLANT
ELECT REM PT RETURN 15FT ADLT (MISCELLANEOUS) ×4 IMPLANT
EVACUATOR SILICONE 100CC (DRAIN) ×2 IMPLANT
GAUZE SPONGE 4X4 12PLY STRL (GAUZE/BANDAGES/DRESSINGS) IMPLANT
GLOVE BIO SURGEON STRL SZ7.5 (GLOVE) ×14 IMPLANT
GLOVE BIOGEL PI IND STRL 7.0 (GLOVE) IMPLANT
GLOVE BIOGEL PI IND STRL 7.5 (GLOVE) IMPLANT
GLOVE BIOGEL PI INDICATOR 7.0 (GLOVE) ×4
GLOVE BIOGEL PI INDICATOR 7.5 (GLOVE) ×4
GLOVE ECLIPSE 7.0 STRL STRAW (GLOVE) ×4 IMPLANT
GLOVE INDICATOR 8.0 STRL GRN (GLOVE) ×8 IMPLANT
GLOVE SURG SS PI 7.0 STRL IVOR (GLOVE) ×4 IMPLANT
GOWN STRL REUS W/TWL LRG LVL3 (GOWN DISPOSABLE) ×12 IMPLANT
GOWN STRL REUS W/TWL XL LVL3 (GOWN DISPOSABLE) ×22 IMPLANT
GUIDEWIRE ANG ZIPWIRE 038X150 (WIRE) ×2 IMPLANT
GUIDEWIRE STR DUAL SENSOR (WIRE) ×6 IMPLANT
HOLDER FOLEY CATH W/STRAP (MISCELLANEOUS) ×4 IMPLANT
KIT PRC NS LF DISP ENDO (KITS) IMPLANT
KIT PROCEDURE OLYMPUS (KITS) ×4
LIGASURE IMPACT 36 18CM CVD LR (INSTRUMENTS) IMPLANT
MANIFOLD NEPTUNE II (INSTRUMENTS) ×4 IMPLANT
PACK COLON (CUSTOM PROCEDURE TRAY) ×4 IMPLANT
PACK CYSTO (CUSTOM PROCEDURE TRAY) ×4 IMPLANT
PAD POSITIONING PINK XL (MISCELLANEOUS) ×4 IMPLANT
PLUG CATH AND CAP STER (CATHETERS) ×2 IMPLANT
PORT LAP GEL ALEXIS MED 5-9CM (MISCELLANEOUS) IMPLANT
POUCH OSTOMY 2 PC DRNBL 2.25 (WOUND CARE) IMPLANT
POUCH OSTOMY DRNBL 2 1/4 (WOUND CARE) ×4
RELOAD STAPLE 60 4.1 GRN THCK (STAPLE) IMPLANT
RELOAD STAPLER GREEN 60MM (STAPLE) ×2 IMPLANT
RETRACTOR WND ALEXIS 18 MED (MISCELLANEOUS) IMPLANT
RTRCTR WOUND ALEXIS 18CM MED (MISCELLANEOUS)
SCISSORS LAP 5X35 DISP (ENDOMECHANICALS) ×4 IMPLANT
SEALER TISSUE G2 STRG ARTC 35C (ENDOMECHANICALS) ×4 IMPLANT
SET IRRIG TUBING LAPAROSCOPIC (IRRIGATION / IRRIGATOR) ×4 IMPLANT
SHEARS HARMONIC ACE PLUS 36CM (ENDOMECHANICALS) IMPLANT
SLEEVE ADV FIXATION 5X100MM (TROCAR) ×8 IMPLANT
SPONGE DRAIN TRACH 4X4 STRL 2S (GAUZE/BANDAGES/DRESSINGS) IMPLANT
SPONGE LAP 18X18 X RAY DECT (DISPOSABLE) ×2 IMPLANT
STAPLER CIRCULAR 29MM (STAPLE) ×2 IMPLANT
STAPLER ECHELON LONG 60 440 (INSTRUMENTS) ×2 IMPLANT
STAPLER RELOAD GREEN 60MM (STAPLE) ×4
STAPLER VISISTAT 35W (STAPLE) IMPLANT
SUT ETHILON 2 0 PS N (SUTURE) ×6 IMPLANT
SUT ETHILON 3 0 PS 1 (SUTURE) ×2 IMPLANT
SUT MNCRL AB 4-0 PS2 18 (SUTURE) ×6 IMPLANT
SUT PDS AB 1 CT1 27 (SUTURE) ×4 IMPLANT
SUT PDS AB 1 CTX 36 (SUTURE) IMPLANT
SUT PDS AB 1 TP1 96 (SUTURE) IMPLANT
SUT PROLENE 2 0 KS (SUTURE) ×4 IMPLANT
SUT PROLENE 2 0 SH DA (SUTURE) ×4 IMPLANT
SUT SILK 2 0 (SUTURE) ×4
SUT SILK 2 0 SH CR/8 (SUTURE) ×4 IMPLANT
SUT SILK 2-0 18XBRD TIE 12 (SUTURE) ×2 IMPLANT
SUT SILK 3 0 (SUTURE) ×4
SUT SILK 3 0 SH CR/8 (SUTURE) ×4 IMPLANT
SUT SILK 3-0 18XBRD TIE 12 (SUTURE) ×2 IMPLANT
SUT VIC AB 3-0 SH 18 (SUTURE) ×4 IMPLANT
SUT VICRYL 0 UR6 27IN ABS (SUTURE) ×2 IMPLANT
SUT VICRYL 2 0 18  UND BR (SUTURE) ×2
SUT VICRYL 2 0 18 UND BR (SUTURE) ×2 IMPLANT
SYS LAPSCP GELPORT 120MM (MISCELLANEOUS)
SYSTEM LAPSCP GELPORT 120MM (MISCELLANEOUS) IMPLANT
TOWEL OR 17X26 10 PK STRL BLUE (TOWEL DISPOSABLE) IMPLANT
TOWEL OR NON WOVEN STRL DISP B (DISPOSABLE) ×4 IMPLANT
TRAY FOLEY MTR SLVR 14FR STAT (SET/KITS/TRAYS/PACK) ×2 IMPLANT
TRAY IRRIG W/60CC SYR STRL (SET/KITS/TRAYS/PACK) ×4 IMPLANT
TROCAR ADV FIXATION 12X100MM (TROCAR) ×2 IMPLANT
TROCAR ADV FIXATION 5X100MM (TROCAR) ×4 IMPLANT
TROCAR XCEL BLUNT TIP 100MML (ENDOMECHANICALS) ×4 IMPLANT
TUBING CONNECTING 10 (TUBING) ×4 IMPLANT
TUBING CONNECTING 10' (TUBING) ×2
TUBING INSUF HEATED (TUBING) ×4 IMPLANT

## 2018-07-12 NOTE — Op Note (Addendum)
07/12/2018  1:11 PM  PATIENT:  Deborah Arroyo  67 y.o. female  Patient Care Team: Rehman, Charlsie Quest, DO as PCP - General  PRE-OPERATIVE DIAGNOSIS:  Rectal cancer  POST-OPERATIVE DIAGNOSIS:  Rectal cancer  PROCEDURE:   1. Laparoscopic low anterior resection with coloproctostomy 2. Diverting loop ileostomy 3. Flexible sigmoidoscopy 4. Bilateral TAP Blocks  SURGEON:  Sharon Mt. Dema Severin, MD  ASSISTANT: Michael Boston, MD - An MD assistant was necessary for complex exposure of pelvic anatomy to facilitate safe exposure and additionally for creation of the colorectal anastomosis.  ANESTHESIA:   general  COUNTS:  Sponge, needle and instrument counts were reported correct x2 at the conclusion of the operation.  EBL: 30cc  DRAINS: 19 Fr round blake drain left draining pelvis  SPECIMEN: 1. Sigmoid colon and rectum as one unit - open end is proximal; staple line/tattoo area is distal 2. Donuts - stitch/anvil are the proximal donut; distal donut was free. Distal donut representing final distal margin as well  FINDINGS: Tattoo well below mass - 3-4cm. LAR performed with distal margin being 2cm below tumor and confirmed by flexible sigmoidoscopy before closing stapler. 99mm EEA used to create colorectal anastomosis which is located 4.5cm from the anal verge by palpation and flexible sigmoidoscopy.  DISPOSITION: PACU in satisfactory condition  INDICATION: [name] is a very pleasant 33yoF whom presented to my office with a new diagnosis of mid rectal cancer. For at least the last year she reports having had issues with hematochezia with each bowel movement. She denies whatsoever any abdominal pain, cramping, bloating, nausea, vomiting. She reports having normal bowel movements throughout all this. She has been tolerating a diet well. She was seen by Dr. Hilarie Fredrickson for the hematochezia and taken for colonoscopy on 03/01/18 which was notable for a circumferential partially obstructing mass in the  rectum approximately 5 cm from the anal verge that could not be traversed with a EGD scope. Biopsies returned adenocarcinoma. The area distal to this mass was tattooed. She reports issues with incontinence of her feces for at least the last 1-2 years and this has been getting worse over time. Her incontinence is primarily to liquid stool as well as gas. She wears a pad daily. She does not believe this significantly impacts her daily quality of life or changes her activities based on this.  She underwent MRI pelvis 03/13/18 which demonstrated a locally advanced rectal cancer- [id number]. CT C/A/P demonstrated no evidence of distant metastatic disease. She underwent neoadjuvant chemoXRT with Xeloda and radiation which she completed 05/11/18 and tolerated well.  She completed her colonoscopy with Dr. Hilarie Fredrickson earlier this month which demonstrated a nonobstructing mass in the proximal rectum that he measured 8 cm from the dentate. No bleeding was present. Distal to the mass tattoo was noted. There is additionally a 10 mm polyp in hepatic flexure that was removed piecemeal. Finally a polyp was found in descending colon and removed. Pathology revealed TAs  After consideration of all options she has requested we proceed with resection of the rectal cancer, attempt to reconnect colon to rectum and proceed with diverting loop ileostomy if necessary. 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 We discussed possibilty of colostomy to avoid these issues and discussed life with a colostomy. We also discussed possibility of colostomy if intraoperative findings dictate this need. She expressed understanding. We also talked about issues that can arise with an  ileostomy including dehydration and kidney injury, readmission, pouching problems/leaking  The planned procedure, material risks (including, but  not limited to, pain, bleeding, infection, scarring, need for blood transfusion, damage to surrounding structures- blood vessels/nerves/viscus/organs, damage to vagina, dehydration, kidney injury, damage to ureter, urine leak, leak from anastomosis, need for additional procedures, need for stoma which may be permanent, hernia, recurrence of cancer, pneumonia, heart attack, stroke, death) benefits and alternatives to surgery were discussed at length. The patient's questions were answered to her satisfaction, she voiced understanding and elected to proceed with surgery. Additionally, we discussed typical postop expectations and the recovery process.  DESCRIPTION: The patient was identified in preop holding and taken to the OR where she was placed on the operating room table and SCDs were placed. General endotracheal anesthesia was induced without difficulty and she was then positioned in lithotomy with Allen stirrups. Pressure points were then all padded and verified. Dr. Gloriann Loan (urology) then scrubbed for cystoscopy/ureteral stents. Please refer to his note. The hair at all planned surgical sites was clipped as necessary. The patient was then prepped and draped in the standard sterile fashion. A surgical timeout was performed indicating the correct patient, procedure, positioning and need for preoperative antibiotics.   An OG tube was in place and confirmed to be working by anesthesia.  A Veress needle was then used to access the left upper quadrant at Palmer's point.  Intraperitoneal location was confirmed using aspiration and saline drop test.  Following this, pneumoperitoneum was established with CO2 to a maximum pressure of 15 mmHg.  A 5 mm trocar was then placed using optical visualization into the peritoneal cavity.  Laparoscopic visualization of the perineal cavity revealed no evidence of trocar site or Veress needle complications.  The abdomen was inspected and there were no abnormal findings noted.  There  were omental adhesions to the midline.  Under laparoscopic guidance, bilateral T AP blocks were then performed using a mixture of Exparel and Marcaine with epinephrine. Two 5 mm trochars were then placed in the right hemiabdomen.  Omental adhesions were then taken down sharply and carefully.  An additional 12 mm trocar was placed in the infraumbilical position. The patient was then positioned in Trendelenburg with some right side down.  The small bowel was swept out of the pelvis. The sigmoid colon and intraperitoneal rectum were visualized.  The tattoo was visualized at the peritoneal reflection.  Beginning with a medial to lateral approach, the rectosigmoid colon was grasped and elevated anteriorly.  The peritoneum on the right side was incised.  The avascular plane between the presacral space and proximal mesorectum was gained and developed cephalad and caudad.  Working cephalad towards the IMA pedicle, the plane of dissection was continued.  The IMA pedicle was identified and working lateral to this, the left ureter was readily identified.  This was protected free of injury.  Lateral to the ureter the gonadal's were identified and also protected free of injury.  The plane of dissection continued cephalad developing the plane between the descending mesocolon and throat is fascia.  Following this attention was turned to the IMA pedicle.  The IMA pedicle was circumferentially dissected approximately 1 cm from its takeoff from the aorta.  After confirming that the left ureter was indeed down, the IMA pedicle was ligated using the Enseal device.  The pedicle was inspected and noted to be hemostatic.  Cephalad to this location, the inferior mesenteric vein was identified and also ligated after being circumferentially dissected using the Enseal  device.  Attention was then turned to the lateral dissection.  The sigmoid colon was mobilized off the intersigmoid fossa and the plane of dissection from the medial approach  was identified and entered.  At this point, the descending and sigmoid colon were completely mobilized.  Attention was then turned to performing the proctectomy.  The avascular plane between the presacral fascia and fascia propria of the rectum was identified, entered and developed.  Retracting the rectosigmoid colon cephalad, the pelvic dissection commenced sharply.  Both the left and right ureter were identified and protected during the dissection.  The dissection proceeded posteriorly first.  The hypogastric nerve and the left and right branches were identified and protected free of injury.  Working in the posterior plane deep within the pelvis, the rectus sacral (Waldeyer's fascia) was identified and opened as the dissection continued posteriorly.  The tattoo had been identified and dissection continued down to the level of the tattoo and just beyond this. Dissection then commenced laterally followed by anteriorly last.  During the lateral dissection, care was taken to stay out of both pelvic sidewalls and working medial to both ureters.  During the anterior dissection, retraction and care was taken to elevate the genitourinary structures off the anterior wall of the rectum again staying in the avascular plane.  Digital examination from below was used to confirm the tumor location and the level of dissection. This was then verified with flexible endoscopy. The mesorectum was freed from the rectum at this level with the Enseal device.  A powered Echelon Endo GIA with a green load was used to divide the the rectum and again this was done under flexible endoscopic guidance to ensure a 2 cm gross margin intraoperatively, which we had. This was done with a single firing of the stapler. The rectum was then delivered out of the pelvis, the pelvis was irrigated and hemostasis verified.  The descending colon was mobilized along the Lashan Gluth line of Toldt up to the splenic flexure. Her descending and sigmoid colon were  quite redundant.  The descending colon was then placed in the pelvis and easily reached and remained in that location without any tension or additional mobilization.  The planned location of the colorectal anastomosis was selected in an area that was pink and well perfused at the approximate level of the junction between the descending/sigmoid colon. The mesentery was then divided up to the level of the planned point of colon transection using the Enseal energy device.  A ~6 cm Pfannenstiel incision was made and deepened with electrocautery.  The anterior sheath of the rectus fascia was identified and opened transversely.  Superior and inferior flaps were created between the anterior rectus sheath and the rectus muscle.  The midline was identified between the 2 rectus muscles and with established pneumoperitoneum, the peritoneum was opened.  An Charlack wound protector was placed.  The rectum and sigmoid colon were then delivered through this wound protector and the planned site of transection was identified.  The colon at this level was pink and well-perfused.  A pursestring device was then applied and a 0 Prolene suture was passed through this device.  The colon just distal to the pursestring device was then transected and passed off as specimen.  The pursestring device was removed and the lumen visualized.  Additional 3-0 silk sutures were placed to secure the pursestring to the colon.  EEA sizers were used and a 29 mm EEA anastomosis was planned.  The anvil was placed within the  lumen and the pursestring tied.  The anastomosis was prepared and no diverticula were noted to be within the proximal portion of then planned staple line.  At this point, the specimen was then opened on the back table. This was done by removing a portion of the distal staple line and so as not to distort pathologic assessment of the specimen.  The distal rectum was then intussuscepted out of the proctotomy and the tumor was visualized.   The tumor had been completely excised and no margin involvement grossly. The margin was 2 cm based on measurements prior to firing the stapler.  Gowns/gloves were then changed  The Alexis wound protector cap was then placed and pneumoperitoneum reestablished.  At this point the procedure, I went down to the perineum to pass the stapler with my assistant working from above.   Well lubricated EEA sizers were then passed in the anal canal.  The 29 mm lubricated EEA stapler was then passed and once abutting the staple line the spike was deployed.  The colon and distal most aspect of rectum were then mated; orientation confirmed - no bowel was underneath the mesentery nor was there any twisting of the colon. The stapler was then closed, held and fired.  The colon was occluded proximally and the pelvis was again filled with sterile saline.  The flexible sigmoidoscope was then inserted and inspection of the anastomosis demonstrated a pink, hemostatic, patent anastomosis and there was no air leak.  The irrigation was then evacuated.  A 19 French round Blake drain was placed draining the pelvis and secured to the skin with a 2-0 nylon suture  Attention was then turned to creating the diverting loop ileostomy. A cap was placed on the wound protector and pneumoperitoneum re-established. The terminal ileum was identified at the junction with the cecum and traced approximately 30 cm proximal to the ileocecal valve.  Orientation was then confirmed as the proximal aspect was marked with a purple marking pen laparoscopically.  A wheel of skin at the premarked site for the ileostomy was then excised as was the underlying subcutaneous tissue incised.  The anterior sheath of the rectus fascia was identified and incised vertically, "T'ing" the mid portion of the fasciotomy.  The rectus muscle was then spread bluntly and the posterior rectus sheath was identified.  The peritoneum was then opened and the loop of distal ileum was  then brought out.  The abdomen was reinspected and hemostasis verified.  The orientation of the ileostomy was again confirmed ensuring there is no twisting or kinks of the ileum leading up to the abdominal wall or distally.  The trocars were then removed under direct visualization as well as the [place] wound protector.  Gowns and gloves of all participating members of the operating team were then changed and the abdomen was redraped with sterile drapes and a separate closing set was obtained.  Attention was directed at closing the Pfannenstiel incision.  The peritoneum was closed with a running 2-0 vicryl suture. The fascia was then approximated using running #1 PDS sutures.  The skin of all incision sites was then closed with 4-0 Monocryl subcuticular sutures.  The incisions were covered with Dermabond.  Blue towels were used to isolate the ileostomy from all other incisions.  A window was created in the mesentery of the loop ileostomy and a stoma rod was passed (red rubber).  This was then secured to itself with nylon suture. The loop of ileum was then opened and the proximal  end Brooked with 3-0 Vicryl sutures.  The distal limb was secured to the skin with interrupted 3-0 Vicryl sutures.  An ostomy appliance was then cut to size and placed over the stoma. A drain dressing was placed.  The patient was then taken out of the lithotomy position, extubated, and transferred to a stretcher for transport to PACU in satisfactory condition.

## 2018-07-12 NOTE — Anesthesia Procedure Notes (Signed)
Procedure Name: Intubation Date/Time: 07/12/2018 8:51 AM Performed by: [place] Shaiann Mcmanamon C, CRNA Pre-anesthesia Checklist: Patient identified, Emergency Drugs available, Suction available and Patient being monitored Patient Re-evaluated:Patient Re-evaluated prior to induction Oxygen Delivery Method: Circle system utilized Preoxygenation: Pre-oxygenation with 100% oxygen Induction Type: IV induction Ventilation: Mask ventilation without difficulty Laryngoscope Size: Mac and 3 Grade View: Grade I Tube type: Oral Tube size: 7.0 mm Number of attempts: 1 Airway Equipment and Method: Stylet and Oral airway Placement Confirmation: ETT inserted through vocal cords under direct vision,  positive ETCO2 and breath sounds checked- equal and bilateral Secured at: 21 cm Tube secured with: Tape Dental Injury: Teeth and Oropharynx as per pre-operative assessment

## 2018-07-12 NOTE — H&P (Signed)
CC: Here today for surgery  HPI: Ms. Melick is a pleasant 89yoF with no known past medical history. For at least the last year she reports having had issues with hematochezia with each bowel movement. She denies whatsoever any abdominal pain, cramping, bloating, nausea, vomiting. She reports having normal bowel movements throughout all this. She has been tolerating a diet well. She was seen by Dr. Hilarie Fredrickson for the hematochezia and taken for colonoscopy on 03/01/18 which was notable for a circumferential partially obstructing mass in the rectum approximately 5 cm from the anal verge that could not be traversed with a EGD scope. Biopsies returned adenocarcinoma. The area distal to this mass was tattooed. She reports issues with incontinence of her feces for at least the last 1-2 years and this has been getting worse over time. Her incontinence is primarily to liquid stool as well as gas. She wears a pad daily. She does not believe this significantly impacts her daily quality of life or changes her activities based on this, but experiences daily accidents. She reports her weight as being stable and has gained 3 lbs in the last month.  She was seen by one of my colleagues in medical oncology, Dr. Benay Spice earlier this week and was quite frustrated with her new diagnosis of cancer as well as the amount of information being delivered to her regarding all of this. She left his office frustrated and declined follow-up the following day with Dr. Lisbeth Renshaw radiation oncology.  She underwent MRI pelvis 03/13/18 which demonstrated a locally advanced rectal cancer- cT3cN2. CT C/A/P 03/03/18 - showed a large rectal mass approximately 6 cm in length 5 cm of anorectal junction with multiple surrounding lymph nodes. Mildly enlarged right hilar lymph node not favored to be metastatic based on the radiology interpretation and other subcentimeter nodules which warrant surveillance  She completed Xeloda + XRT 05/11/18. She  tolerated this reasonably well. Since completing this, she reports improvement in some of her symptoms. She still does have some incontinent episodes to primarily liquid stool and accidents occurring once a week. She does occasionally also have incontinence to gas. She denies incontinence to solid stool. She does not wear a pad or diaper.  She completed her colonoscopy with Dr. Hilarie Fredrickson earlier this month which demonstrated a nonobstructing mass in the proximal rectum that he measured 8 cm from the dentate. No bleeding was present. Distal to the mass tattoo was noted. There is additionally a 10 mm polyp in hepatic flexure that was removed piecemeal. Finally a polyp was found in descending colon and removed. Pathology revealed TAs  After consideration of all options she has requested we proceed with resection of the rectal cancer, attempt to reconnect colon to rectum and proceed with diverting loop ileostomy if necessary. 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 We discussed possibilty of colostomy to avoid these issues and discussed life with a colostomy. We also discussed possibility of colostomy if intraoperative findings dictate this need. She expressed understanding. We also talked about issues that can arise with an ileostomy including dehydration and kidney injury, readmission, pouching problems/leaking   PMH: Denies  PSH: Open appendectomy - RLQ Laparoscopic cholecystectomy BTL-low midline incision Hysterectomy-TVH-denies prior radiation but this was done for cervical cancer Shoulder surgery Eye surgery  OB/Gyn: G4P3, all vaginal deliveries  FHx: Brother-prostate ca Mom-uterine ca Sister-gastric and ovarian ca Brother-lung cancer Daughter-breast ca  Social: Denies use of  tobacco-one to one and a half packs per day for the last 40-42 years; denies use of  alcohol or drugs. She is currently retired-has held numerous prior jobs, the most recent being it [employer]  ROS: A comprehensive 10 system review of systems was completed with the patient and pertinent findings as noted above  Hgb 12/2017 14.7 (from 14.3 08/2017) CEA 03/01/18: 1.9 CT C/A/P 03/03/18: Large rectal mass approximate 6 cm in length located 5 cm from the anorectal junction with multiple surrounding perirectal/presacral lymph nodes likely representing local nodal metastatic spread. Mildly enlarged right hilar lymph node favored incisional by radiologist. 6 x 3 mm lingular nodule warranting surveillance.    Past Medical History:  Diagnosis Date  . Cancer ([place]) 03/01/2018   rectal cancer=had chemo pills and radation   . Cataract   . Cervix cancer ([place]) 1979  . Depression   . Dyspnea   . Hypoglycemia   . Hyponatremia    syncope with this and admitted twice due to this issue   . Osteopenia   . Rectal bleeding   . Smoker     Past Surgical History:  Procedure Laterality Date  . ABDOMINAL HYSTERECTOMY    . APPENDECTOMY    . BIOPSY  07/04/2018   Procedure: BIOPSY;  Surgeon: Jerene Bears, MD;  Location: [facility];  Service: Gastroenterology;;  . CHOLECYSTECTOMY    . COLONOSCOPY  03/01/2018   Dr.Pyrtle  . COLONOSCOPY WITH PROPOFOL N/A 07/04/2018   Procedure: COLONOSCOPY WITH PROPOFOL;  Surgeon: Jerene Bears, MD;  Location: [facility];  Service: Gastroenterology;  Laterality: N/A;  . EYE SURGERY    . POLYPECTOMY  07/04/2018   Procedure: POLYPECTOMY;  Surgeon: Jerene Bears, MD;  Location: [facility];  Service: Gastroenterology;;  . SHOULDER SURGERY Left    Rotary Cuff Tear  . TUBAL LIGATION      Family History  Problem Relation Age of Onset  . Stomach cancer Sister   . Ovarian cancer Sister   . Cancer - Ovarian Sister   . Dementia Mother   . Uterine cancer Mother   . Endometrial cancer Mother   . Prostate cancer Brother   . Cancer - Prostate Brother   .  Cancer Other   . Cancer Brother        lung/brain  . Breast cancer Daughter   . Alzheimer's disease Father   . Colon cancer Neg Hx   . Esophageal cancer Neg Hx   . Pancreatic cancer Neg Hx   . Rectal cancer Neg Hx   . Colon polyps Neg Hx     Social:  reports that she has been smoking cigarettes. She has been smoking about 1.50 packs per day. She has never used smokeless tobacco. She reports that she does not drink alcohol or use drugs.  Allergies:  Allergies  Allergen Reactions  . Aleve [Naproxen Sodium] Other (See Comments)    Eyes and throat swell up    Medications: I have reviewed the patient's current medications.  No results found for this or any previous visit (from the past 48 hour(s)).  No results found.  ROS - all of the below systems have been reviewed with the patient and positives are indicated with bold text General: chills, fever or night sweats Eyes: blurry vision or double vision ENT: epistaxis or sore throat Allergy/Immunology: itchy/watery eyes or nasal congestion Hematologic/Lymphatic: bleeding problems, blood clots or swollen lymph nodes Endocrine: temperature intolerance or unexpected weight changes Breast: new or changing breast  lumps or nipple discharge Resp: cough, shortness of breath, or wheezing CV: chest pain or dyspnea on exertion GI: as per HPI GU: dysuria, trouble voiding, or hematuria MSK: joint pain or joint stiffness Neuro: TIA or stroke symptoms Derm: pruritus and skin lesion changes Psych: anxiety and depression  PE Blood pressure 125/72, pulse 68, temperature (!) 96.6 F (35.9 C), temperature source Axillary, resp. rate 18, SpO2 99 %. Constitutional: NAD; conversant; no deformities Eyes: Moist conjunctiva; no lid lag; anicteric; PERRL Neck: Trachea midline; no thyromegaly Lungs: Normal respiratory effort; no tactile fremitus CV: RRR; no palpable thrills; no pitting edema GI: Abd soft, NT/ND; no palpable hepatosplenomegaly MSK:  Normal gait; no clubbing/cyanosis Psychiatric: Appropriate affect; alert and oriented x3 Lymphatic: No palpable cervical or axillary lymphadenopathy   A/P: Ms. Parkerson is a pleasant 62yoF with 61yr hx of hematochezia, recently diagnosed with large mid rectal cancer - 5cm from anal verge; CEA 1.9; no evidence of metastatic disease on CT C/A/P; MRI-P shows this to be cT3cN2 - s/p Xeloda+XRT - completed 05/11/18 - here for surgery  -As discussed in office, will plan laparoscopic vs open low anterior resection with colorectal anastomosis, possible diverting loop ileostomy, flexible sigmoidoscopy; cystoscopy/stents by urology. -The planned procedure, material risks (including, but not limited to, pain, bleeding, infection, scarring, need for blood transfusion, damage to surrounding structures- blood vessels/nerves/viscus/organs, damage to vagina, dehydration, kidney injury, damage to ureter, urine leak, leak from anastomosis, need for additional procedures, need for stoma which may be permanent, hernia, recurrence of cancer, pneumonia, heart attack, stroke, death) benefits and alternatives to surgery were discussed at length. The patient's questions were answered to her satisfaction, she voiced understanding and elected to proceed with surgery. Additionally, we discussed typical postop expectations and the recovery process.  Sharon Mt. Dema Severin, M.D. General and Colorectal Surgery [facility]

## 2018-07-12 NOTE — Transfer of Care (Signed)
Immediate Anesthesia Transfer of Care Note  Patient: Deborah Arroyo  Procedure(s) Performed: LAPAROSCOPIC LOW ANTERIOR RESECTION WITH COLOPROSTOSTOMY, ERAS PATHWAY (N/A ) DIVERTING LOOP ILEOSTOMY (N/A ) FLEXIBLE SIGMOIDOSCOPY (N/A ) CYSTOSCOPY WITH STENT PLACEMENT (Bilateral )  Patient Location: PACU  Anesthesia Type:General  Level of Consciousness: awake, alert  and oriented  Airway & Oxygen Therapy: Patient Spontanous Breathing and Patient connected to face mask oxygen  Post-op Assessment: Report given to RN and Post -op Vital signs reviewed and stable  Post vital signs: Reviewed and stable  Last Vitals:  Vitals Value Taken Time  BP 100/62 07/12/2018  2:00 PM  Temp    Pulse 74 07/12/2018  2:10 PM  Resp 11 07/12/2018  2:10 PM  SpO2 92 % 07/12/2018  2:10 PM  Vitals shown include unvalidated device data.  Last Pain:  Vitals:   07/12/18 1345  TempSrc:   PainSc: 0-No pain      Patients Stated Pain Goal: 4 (43/60/67 7034)  Complications: No apparent anesthesia complications

## 2018-07-12 NOTE — Op Note (Signed)
Operative Note  Preoperative diagnosis:  1.  Anal cancer  Post operative diagnosis: 1.  Anal cancer  Procedure(s): 1.  Cystoscopy with bilateral retrograde pyelogram and bilateral open-ended ureteral catheter placement  Surgeon: Eugene Bell, MD  Assistants: None  Anesthesia: General  Complications: None immediate  EBL: Minimal  Specimens: 1.  None  Drains/Catheters: 1.  Bilateral open-ended ureteral catheters 2.  Foley catheter  Intraoperative findings: 1.  Normal urethra and bladder 2.  Bilateral retrograde pyelogram revealed normal ureter and kidney without any filling defects  Indication: 66-year-old female with a history of anal cancer presents for LAR.  Intraoperative stent placement was requested.  Description of procedure:  The patient was identified and consent was obtained.  The patient was taken to the operating room and placed in the supine position.  The patient was placed under general anesthesia.  Perioperative antibiotics were administered.  The patient was placed in dorsal lithotomy.  Patient was prepped and draped in a standard sterile fashion and a timeout was performed.  A 21 French rigid cystoscope was advanced into the urethra and into the bladder.  The right distal most portion of the ureter was cannulated with an open-ended ureteral catheter.  Retrograde pyelogram was performed with the findings noted above.  A sensor wire was then advanced up to the kidney under fluoroscopic guidance.  The open-ended ureteral catheter was advanced over the wire up to the kidney and then the scope and wire were withdrawn keeping the open-ended ureteral catheter in place.   The left distal most portion of the ureter was cannulated with an open-ended ureteral catheter.  Retrograde pyelogram was performed with the findings noted above.  A sensor wire was then advanced but met resistance.  A Glidewire also would not pass up.  Therefore used a semirigid ureteroscope to  cannulate just the distal portion of the ureter.  This revealed that the sensor wire had mildly back walled the ureter.  This was incomplete.  There was not a significant injury but was impeding the ability to pass a wire.  I was able to pass a wire through the scope and into the true lumen up to the kidney.  The open-ended ureteral catheter was then passed over the wire up to the kidney and the wire was withdrawn.  I reintroduced the cystoscope.  The open-ended ureteral catheter was advanced over the wire up to the kidney and then the scope and wire were withdrawn keeping the open-ended ureteral catheter in place.Foley catheter was placed and both open-ended ureteral catheters were secured and threaded through into a drainage bag.  This concluded my portion of the operation.  Patient tolerated procedure well and the case was handed over to general surgery.  Plan: Per general surgery.  Urology will be available as needed.  

## 2018-07-12 NOTE — H&P (Signed)
H&P Physician requesting consult: Nadeen Landau, MD  Chief Complaint: Rectal cancer  History of Present Illness: 67 year old female recently diagnosed with a large mid rectal cancer.  She is undergoing laparoscopic versus open LAR with colorectal anastomosis with possible diverting loop ileostomy.  Intraoperative stent placement is requested.  She has no complaints today.  Past Medical History:  Diagnosis Date  . Cancer ([place]) 03/01/2018   rectal cancer=had chemo pills and radation   . Cataract   . Cervix cancer ([place]) 1979  . Depression   . Dyspnea   . Hypoglycemia   . Hyponatremia    syncope with this and admitted twice due to this issue   . Osteopenia   . Rectal bleeding   . Smoker    Past Surgical History:  Procedure Laterality Date  . ABDOMINAL HYSTERECTOMY    . APPENDECTOMY    . BIOPSY  07/04/2018   Procedure: BIOPSY;  Surgeon: Jerene Bears, MD;  Location: [facility];  Service: Gastroenterology;;  . CHOLECYSTECTOMY    . COLONOSCOPY  03/01/2018   Dr.Pyrtle  . COLONOSCOPY WITH PROPOFOL N/A 07/04/2018   Procedure: COLONOSCOPY WITH PROPOFOL;  Surgeon: Jerene Bears, MD;  Location: [facility];  Service: Gastroenterology;  Laterality: N/A;  . EYE SURGERY    . POLYPECTOMY  07/04/2018   Procedure: POLYPECTOMY;  Surgeon: Jerene Bears, MD;  Location: [facility];  Service: Gastroenterology;;  . SHOULDER SURGERY Left    Rotary Cuff Tear  . TUBAL LIGATION      Home Medications:  Medications Prior to Admission  Medication Sig Dispense Refill Last Dose  . acetaminophen (TYLENOL) 325 MG tablet Take 975 mg by mouth every 6 (six) hours as needed for moderate pain.    07/12/2018 at 0130   Allergies:  Allergies  Allergen Reactions  . Aleve [Naproxen Sodium] Other (See Comments)    Eyes and throat swell up    Family History  Problem Relation Age of Onset  . Stomach cancer Sister   . Ovarian cancer Sister   . Cancer - Ovarian Sister   . Dementia Mother   . Uterine cancer  Mother   . Endometrial cancer Mother   . Prostate cancer Brother   . Cancer - Prostate Brother   . Cancer Other   . Cancer Brother        lung/brain  . Breast cancer Daughter   . Alzheimer's disease Father   . Colon cancer Neg Hx   . Esophageal cancer Neg Hx   . Pancreatic cancer Neg Hx   . Rectal cancer Neg Hx   . Colon polyps Neg Hx    Social History:  reports that she has been smoking cigarettes. She has been smoking about 1.50 packs per day. She has never used smokeless tobacco. She reports that she does not drink alcohol or use drugs.  ROS: A complete review of systems was performed.  All systems are negative except for pertinent findings as noted. ROS   Physical Exam:  Vital signs in last 24 hours: Temp:  [96.6 F (35.9 C)] 96.6 F (35.9 C) (09/11 0625) Pulse Rate:  [68] 68 (09/11 0625) Resp:  [18] 18 (09/11 0625) BP: (125)/(72) 125/72 (09/11 0625) SpO2:  [99 %] 99 % (09/11 0625) Weight:  [51.7 kg] 51.7 kg (09/11 0811) General:  Alert and oriented, No acute distress HEENT: Normocephalic, atraumatic Neck: No JVD or lymphadenopathy Cardiovascular: Regular rate and rhythm Lungs: Regular rate and effort Abdomen: Soft, nontender, nondistended, no abdominal masses Back:  No CVA tenderness Extremities: No edema Neurologic: Grossly intact  Laboratory Data:  No results found for this or any previous visit (from the past 24 hour(s)). No results found for this or any previous visit (from the past 240 hour(s)). Creatinine: No results for input(s): CREATININE in the last 168 hours.  Impression/Assessment:  Rectal cancer  Plan:  Proceed with cystoscopy, bilateral ureteral stent placement.  She understands potential risks including but not limited to bleeding, infection, injury to surrounding structures including the bladder ureter or kidney and possible need for additional procedures especially if there is injury to the ureter.  Marton Redwood, III 07/12/2018, 8:43 AM

## 2018-07-12 NOTE — Progress Notes (Signed)
Patient ambulated to the bathroom. Tolerated well. Reports dizziness while sitting on toilet. Passed mucous like, scant amount of stool. JP dressing reinforced due to moderated drainage on dressing. JP output 24ml of bloody drainage. Educated and encouraged to use incentive spirometer during television commercials. Patient able to hit 750 on IS. Foley catheter draining well, free to gravity, no kinks in tubing; drained 17ml of blood tinged urine. Vital signs stable. Will continue to monitor.

## 2018-07-12 NOTE — Anesthesia Postprocedure Evaluation (Signed)
Anesthesia Post Note  Patient: Deborah Arroyo  Procedure(s) Performed: LAPAROSCOPIC LOW ANTERIOR RESECTION WITH COLOPROSTOSTOMY, ERAS PATHWAY (N/A ) DIVERTING LOOP ILEOSTOMY (N/A ) FLEXIBLE SIGMOIDOSCOPY (N/A ) CYSTOSCOPY WITH STENT PLACEMENT (Bilateral )     Patient location during evaluation: PACU Anesthesia Type: General Level of consciousness: awake Pain management: pain level controlled Vital Signs Assessment: post-procedure vital signs reviewed and stable Respiratory status: spontaneous breathing Cardiovascular status: stable Postop Assessment: no apparent nausea or vomiting Anesthetic complications: no    Last Vitals:  Vitals:   07/12/18 1628 07/12/18 1756  BP: 115/66 120/63  Pulse: 88 82  Resp: 14 16  Temp: 36.6 C 36.8 C  SpO2: 93% 96%    Last Pain:  Vitals:   07/12/18 1756  TempSrc: Axillary  PainSc:                  [name]

## 2018-07-12 NOTE — Anesthesia Preprocedure Evaluation (Signed)
Anesthesia Evaluation  Patient identified by MRN, date of birth, ID band Patient awake    Reviewed: Allergy & Precautions, NPO status   Airway Mallampati: II  TM Distance: >3 FB     Dental   Pulmonary shortness of breath, Current Smoker,    breath sounds clear to auscultation       Cardiovascular negative cardio ROS   Rhythm:Regular Rate:Normal     Neuro/Psych  Headaches, Anxiety Depression    GI/Hepatic Neg liver ROS, GI history noted. CG   Endo/Other  negative endocrine ROS  Renal/GU negative Renal ROS     Musculoskeletal   Abdominal   Peds  Hematology   Anesthesia Other Findings   Reproductive/Obstetrics                             Anesthesia Physical Anesthesia Plan  ASA: III  Anesthesia Plan: General   Post-op Pain Management:    Induction: Intravenous  PONV Risk Score and Plan: Ondansetron, Dexamethasone and Midazolam  Airway Management Planned: Oral ETT  Additional Equipment:   Intra-op Plan:   Post-operative Plan: Possible Post-op intubation/ventilation  Informed Consent: I have reviewed the patients History and Physical, chart, labs and discussed the procedure including the risks, benefits and alternatives for the proposed anesthesia with the patient or authorized representative who has indicated his/her understanding and acceptance.     Plan Discussed with: CRNA and Anesthesiologist  Anesthesia Plan Comments:         Anesthesia Quick Evaluation

## 2018-07-13 ENCOUNTER — Encounter (HOSPITAL_COMMUNITY): Payer: Self-pay | Admitting: Surgery

## 2018-07-13 DIAGNOSIS — R0902 Hypoxemia: Secondary | ICD-10-CM

## 2018-07-13 LAB — MAGNESIUM: MAGNESIUM: 1.6 mg/dL — AB (ref 1.7–2.4)

## 2018-07-13 LAB — BASIC METABOLIC PANEL
ANION GAP: 9 (ref 5–15)
BUN: 8 mg/dL (ref 8–23)
CO2: 28 mmol/L (ref 22–32)
Calcium: 9 mg/dL (ref 8.9–10.3)
Chloride: 96 mmol/L — ABNORMAL LOW (ref 98–111)
Creatinine, Ser: 0.75 mg/dL (ref 0.44–1.00)
GFR calc Af Amer: >60 mL/min (ref >60)
GLUCOSE: 152 mg/dL — AB (ref 70–99)
POTASSIUM: 4.9 mmol/L (ref 3.5–5.1)
Sodium: 133 mmol/L — ABNORMAL LOW (ref 135–145)

## 2018-07-13 LAB — CBC
HEMATOCRIT: 35.5 % — AB (ref 36.0–46.0)
Hemoglobin: 12 g/dL (ref 12.0–15.0)
MCH: 32.5 pg (ref 26.0–34.0)
MCHC: 33.8 g/dL (ref 30.0–36.0)
MCV: 96.2 fL (ref 78.0–100.0)
PLATELETS: 314 10*3/uL (ref 150–400)
RBC: 3.69 MIL/uL — AB (ref 3.87–5.11)
RDW: 15.6 % — ABNORMAL HIGH (ref 11.5–15.5)
WBC: 13.8 10*3/uL — AB (ref 4.0–10.5)

## 2018-07-13 LAB — PHOSPHORUS: Phosphorus: 3.2 mg/dL (ref 2.5–4.6)

## 2018-07-13 MED ORDER — SODIUM CHLORIDE 0.9 % IV SOLN
INTRAVENOUS | Status: DC
  Administered 2018-07-13 – 2018-07-21 (×14): via INTRAVENOUS

## 2018-07-13 MED ORDER — MAGNESIUM SULFATE 4 GM/100ML IV SOLN
4.0000 g | Freq: Once | INTRAVENOUS | Status: AC
  Administered 2018-07-13: 4 g via INTRAVENOUS
  Filled 2018-07-13: qty 100

## 2018-07-13 NOTE — Progress Notes (Signed)
OT Cancellation Note  Patient Details Name: Deborah Arroyo MRN: [id number] DOB: 11/06/1950   Cancelled Treatment:    Reason Eval/Treat Not Completed: Fatigue/lethargy limiting ability to participate  Pt had just gotten back to bed- will check on later or next day  Kari Baars, [place]  Payton Mccallum D 07/13/2018, 1:46 PM

## 2018-07-13 NOTE — Evaluation (Signed)
Occupational Therapy Evaluation Patient Details Name: Deborah Arroyo MRN: [id number] DOB: 1951-01-18 Today's Date: 07/13/2018    History of Present Illness Pt is a 67 YO female s/p cystoscopy with bilateral retrograde pyelogram and bilateral open-ended urethral catheter placement; laparoscopic low anterior resection with coloproctostomy, diverting loop ileostomy, flexible sigmoidoscopy, bilateral TAP blocks on 07/12/18. Surgeries secondary to rectal cancer, diagnosed May 2019. Pt with PMH including chemo/radiation for cancer earlier in 2019, cataracts, cervical cancer 1979, depression, dyspnea, hyponatremia with syncope, osteopenia, smoker. Past surgical history includes abdominal hysterectomy, cholecystectomy, L RTC repair.   Clinical Impression   Pt underwent the above sx.  She was limited by pain during OT evaluation. Pt was independent with all adls prior to admission. Goals in acute setting are for mod I to occasional min A.     Follow Up Recommendations  Supervision/Assistance - 24 hour;Home health OT    Equipment Recommendations  (to be further assessed)    Recommendations for Other Services       Precautions / Restrictions Precautions Precautions: Fall Precaution Comments: abdominal - Pt instructed in log rolling technique Restrictions Weight Bearing Restrictions: No      Mobility Bed Mobility Overal bed mobility: Needs Assistance Bed Mobility: Rolling;Sidelying to Sit;Sit to Sidelying Rolling: Min assist      Sit to sidelying: Mod assist General bed mobility comments: assist for bil LEs  Transfers Overall transfer level: Needs assistance Equipment used: Rolling walker (2 wheeled) Transfers: Sit to/from Stand Sit to Stand: Min assist         General transfer comment: light assistance to stand and steady    Balance Overall balance assessment: Mild deficits observed, not formally tested                                         ADL either  performed or assessed with clinical judgement   ADL Overall ADL's : Needs assistance/impaired                         Toilet Transfer: Minimal assistance;Stand-pivot;RW(back to bed)             General ADL Comments: Pt was in pain when I arrived.  Worked on trying to get her more comfortable in recliner but could not.  Assisted her back to bed and called RN for pain medication.  Pt was limited by pain for ADLs today.  Based on clinical judgment, pt can perform UB adls with set up.  She would need max A for LB at this time.  Will continue to assess tomorrow and educate on adaptations.     Vision         Perception     Praxis      Pertinent Vitals/Pain Pain Score: 9  Pain Location: abdomen, especially with coughing  Pain Descriptors / Indicators: Crying;Aching;Guarding;Grimacing     Hand Dominance Right   Extremity/Trunk Assessment Upper Extremity Assessment Upper Extremity Assessment: Overall WFL for tasks assessed      Cervical / Trunk Assessment Cervical / Trunk Assessment: Other exceptions Cervical / Trunk Exceptions: forward flexed posture in standing due to abdominal incisions    Communication Communication Communication: No difficulties   Cognition Arousal/Alertness: Awake/alert Behavior During Therapy: WFL for tasks assessed/performed Overall Cognitive Status: Within Functional Limits for tasks assessed  General Comments       Exercises     Shoulder Instructions      Home Living Family/patient expects to be discharged to:: Private residence Living Arrangements: Children Available Help at Discharge: Family;Available PRN/intermittently               Bathroom Shower/Tub: Teacher, early years/pre: Standard     Home Equipment: None          Prior Functioning/Environment Level of Independence: Independent                 OT Problem List: Decreased  strength;Decreased activity tolerance;Pain;Decreased knowledge of use of DME or AE;Decreased knowledge of precautions      OT Treatment/Interventions: Self-care/ADL training;DME and/or AE instruction;Energy conservation;Patient/family education;Balance training    OT Goals(Current goals can be found in the care plan section) Acute Rehab OT Goals Patient Stated Goal: less pain OT Goal Formulation: With patient Time For Goal Achievement: 07/27/18 Potential to Achieve Goals: Good ADL Goals Pt Will Perform Lower Body Bathing: with supervision;with adaptive equipment;sit to/from stand Pt Will Perform Lower Body Dressing: with min assist;with adaptive equipment;sit to/from stand Pt Will Transfer to Toilet: with modified independence;ambulating;regular height toilet;bedside commode(vs) Pt Will Perform Toileting - Clothing Manipulation and hygiene: with supervision;sit to/from stand Additional ADL Goal #1: pt will perform bed mobility at mod I level, protecting incisions, from flat bed in preparation for adls  OT Frequency: Min 2X/week   Barriers to D/C:            Co-evaluation              AM-PAC PT "6 Clicks" Daily Activity     Outcome Measure Help from another person eating meals?: None Help from another person taking care of personal grooming?: A Little Help from another person toileting, which includes using toliet, bedpan, or urinal?: A Lot Help from another person bathing (including washing, rinsing, drying)?: A Lot Help from another person to put on and taking off regular upper body clothing?: A Little Help from another person to put on and taking off regular lower body clothing?: A Lot 6 Click Score: 16   End of Session    Activity Tolerance: Patient tolerated treatment well Patient left: in bed;with call bell/phone within reach;with bed alarm set;with nursing/sitter in room  OT Visit Diagnosis: Muscle weakness (generalized) (M62.81)                Time: 5956-3875 OT  Time Calculation (min): 18 min Charges:  OT General Charges $OT Visit: 1 Visit OT Evaluation $OT Eval Low Complexity: [place], OTR/L Acute Rehabilitation Services [phone] [facility] pager [phone] office 07/13/2018  Deborah Arroyo 07/13/2018, 4:13 PM

## 2018-07-13 NOTE — Evaluation (Signed)
Physical Therapy Evaluation Patient Details Name: Deborah Arroyo MRN: [id number] DOB: 1951/04/05 Today's Date: 07/13/2018   History of Present Illness  Pt is a 67 YO female s/p cystoscopy with bilateral retrograde pyelogram and bilateral open-ended urethral catheter placement; laparoscopic low anterior resection with coloproctostomy, diverting loop ileostomy, flexible sigmoidoscopy, bilateral TAP blocks on 07/12/18. Surgeries secondary to rectal cancer, diagnosed May 2019. Pt with PMH including chemo/radiation for cancer earlier in 2019, cataracts, cervical cancer 1979, depression, dyspnea, hyponatremia with syncope, osteopenia, smoker. Past surgical history includes abdominal hysterectomy, cholecystectomy, L RTC repair.  Clinical Impression   Pt current admission and PMH information listed above. Pt presents with severe abdominal pain, difficulty performing bed mobility with log roll technique, difficulty performing transfers/ambulation, and decreased endurance for mobility tasks. Pt to benefit from acute PT to address deficits. Pt ambulated in hallway with RW today. Pt with one bout of dizziness at EOB with BP on 114/62. DBP increased upon standing, and pt no longer dizzy upon standing. Pt mostly limited by abdominal pain today. Will continue to follow acutely and progress mobility as able.   BP, HR sitting: 114/62, 78 BP, HR standing: 109/70, 84 BP, HR after ambulation: 110/66, 78 O2sats during session, 3LO2 via [place]: 89-95%, lower after ambulation.    Follow Up Recommendations Home health PT    Equipment Recommendations  Rolling walker with 5" wheels    Recommendations for Other Services       Precautions / Restrictions Precautions Precaution Comments: abdominal - Pt instructed in log rolling technique      Mobility  Bed Mobility Overal bed mobility: Needs Assistance Bed Mobility: Rolling;Sidelying to Sit;Sit to Sidelying Rolling: Min assist Sidelying to sit: Min assist     Sit  to sidelying: Mod assist General bed mobility comments: Pt instructed in log rolling technique. Assist required to roll pt at shoulder girdle and pelvis, to lower bilat LEs when moving from sidelying to sit. Pt with increased time and effort to perform. Mod assist for sit to sidelying for LE management due to pt's abdominal pain. Pt with complaints of lightheadedness sitting EOB, BP114/62 with pulse of 78 bpm. Pt instructed to perfrom UE pumping to increase BP.    Transfers Overall transfer level: Needs assistance Equipment used: Rolling walker (2 wheeled) Transfers: Sit to/from Stand Sit to Stand: Min assist         General transfer comment: Min assist for trunk elevation, steadying upon standing. BP at 109/70 and pulse of 84 bpm upon standing. No dizziness in standing.   Ambulation/Gait Ambulation/Gait assistance: Min guard Gait Distance (Feet): 125 Feet Assistive device: Rolling walker (2 wheeled) Gait Pattern/deviations: Step-through pattern;Decreased stride length;Trunk flexed;Antalgic Gait velocity: very decr    General Gait Details: Min guard assist for safety. Pt with forward flexed posture due to abdominal pain. Pt reporting fatigue after ambulating.   Stairs            Wheelchair Mobility    Modified Rankin (Stroke Patients Only)       Balance Overall balance assessment: Mild deficits observed, not formally tested                                           Pertinent Vitals/Pain Pain Assessment: 0-10 Pain Score: 8  Pain Location: abdomen, especially with coughing  Pain Descriptors / Indicators: Guarding;Grimacing;Aching Pain Intervention(s): Limited activity within patient's tolerance;Monitored during session;Ice applied  Home Living Family/patient expects to be discharged to:: Private residence Living Arrangements: Children(son and grandson live with her ) Available Help at Discharge: Family;Available PRN/intermittently Type of Home:  House Home Access: Stairs to enter Entrance Stairs-Rails: None Entrance Stairs-Number of Steps: 2 Home Layout: Able to live on main level with bedroom/bathroom;Two level Home Equipment: None      Prior Function Level of Independence: Independent         Comments: Pt independent with all ADLs, never has needed any AD.      Hand Dominance   Dominant Hand: Right    Extremity/Trunk Assessment   Upper Extremity Assessment Upper Extremity Assessment: Overall WFL for tasks assessed    Lower Extremity Assessment Lower Extremity Assessment: Generalized weakness    Cervical / Trunk Assessment Cervical / Trunk Assessment: Other exceptions Cervical / Trunk Exceptions: forward flexed posture in standing due to abdominal incisions   Communication   Communication: No difficulties  Cognition Arousal/Alertness: Awake/alert Behavior During Therapy: WFL for tasks assessed/performed Overall Cognitive Status: Within Functional Limits for tasks assessed                                        General Comments      Exercises     Assessment/Plan    PT Assessment Patient needs continued PT services  PT Problem List Decreased strength;Cardiopulmonary status limiting activity;Pain;Decreased activity tolerance;Decreased knowledge of use of DME;Decreased balance;Decreased mobility       PT Treatment Interventions DME instruction;Therapeutic activities;Gait training;Therapeutic exercise;Patient/family education;Stair training;Balance training;Functional mobility training    PT Goals (Current goals can be found in the Care Plan section)  Acute Rehab PT Goals PT Goal Formulation: With patient Time For Goal Achievement: 07/27/18 Potential to Achieve Goals: Good    Frequency Min 3X/week   Barriers to discharge        Co-evaluation               AM-PAC PT "6 Clicks" Daily Activity  Outcome Measure Difficulty turning over in bed (including adjusting  bedclothes, sheets and blankets)?: Unable Difficulty moving from lying on back to sitting on the side of the bed? : Unable Difficulty sitting down on and standing up from a chair with arms (e.g., wheelchair, bedside commode, etc,.)?: Unable Help needed moving to and from a bed to chair (including a wheelchair)?: A Little Help needed walking in hospital room?: A Little Help needed climbing 3-5 steps with a railing? : A Lot 6 Click Score: 11    End of Session Equipment Utilized During Treatment: Oxygen;Gait belt(3L O2 via [place], sats 92% on 3L with sitting EOB) Activity Tolerance: Patient tolerated treatment well;Patient limited by pain Patient left: with call bell/phone within reach;with family/visitor present;in bed;with bed alarm set Nurse Communication: Mobility status PT Visit Diagnosis: Other abnormalities of gait and mobility (R26.89);Muscle weakness (generalized) (M62.81)    Time: 1130-1209 PT Time Calculation (min) (ACUTE ONLY): 39 min   Charges:   PT Evaluation $PT Eval Low Complexity: 1 Low PT Treatments $Gait Training: 8-22 mins $Therapeutic Activity: 8-22 mins       Deborah Arroyo, PT, DPT  Pager # [phone]    Deborah Arroyo 07/13/2018, 1:25 PM

## 2018-07-13 NOTE — Progress Notes (Signed)
Pt declined HH at present time, however asked for RW with seat.

## 2018-07-13 NOTE — Consult Note (Signed)
[facility] Nurse ostomy consult note Stoma type/location: RLQ diverting loop Ileostomy Stomal assessment/size: 1 1/4" round, budded, slightly edematous, moist, red rubber bridge in place Peristomal assessment: intact Treatment options for stomal/peristomal skin: barrier ring Output 10cc clear liquid effluent Ostomy pouching: 2pc. 2 1/4" Education provided: Pt states she will be only caretaker of ostomy.  Ileostomy book given, pouch changed, educated on opening and rolling to close pouch. Demonstrated toilet paper wick to clean pouch. Supplies in bag in room for bedside RNs if needs changing before our next scheduled change. Pt is somewhat child like but seems very eager to learn to care for stoma.  Enrolled patient in [place] program: No, signature obtained, [place] to register. WOC will follow this patient and remain available to this patient, nursing, and the medical and surgical teams. Fara Olden, RN-C, WTA-C, [facility] Associate Ostomy Care Associate

## 2018-07-13 NOTE — Progress Notes (Addendum)
Subjective No acute events. Doing well. Nausea but no emesis. Up walking around. No stoma output yet. Pain well controlled.  Objective: Vital signs in last 24 hours: Temp:  [97.5 F (36.4 C)-98.8 F (37.1 C)] 98.8 F (37.1 C) (09/12 0004) Pulse Rate:  [76-93] 91 (09/12 0004) Resp:  [11-17] 15 (09/12 0004) BP: (97-120)/(62-70) 112/67 (09/12 0004) SpO2:  [90 %-98 %] 97 % (09/12 0004) Weight:  [53.7 kg] 53.7 kg (09/12 0713)    Intake/Output from previous day: 09/11 0701 - 09/12 0700 In: 3958.5 [P.O.:50; I.V.:3408.5; IV Piggyback:500] Out: 4235 [Urine:1275; Drains:150] Intake/Output this shift: Total I/O In: 52 [P.O.:60] Out: 450 [Urine:400; Drains:50]  Gen: NAD, comfortable CV: RRR Pulm: Normal work of breathing Abd: Incisions c/d/i without erythema. Abd soft, NT, mildly distended; loop ileostomy pink with red rubber in place. JP drain with serosang output Ext: SCDs in place  Lab Results: CBC  Recent Labs    07/13/18 0422  WBC 13.8*  HGB 12.0  HCT 35.5*  PLT 314   BMET Recent Labs    07/13/18 0422  NA 133*  K 4.9  CL 96*  CO2 28  GLUCOSE 152*  BUN 8  CREATININE 0.75  CALCIUM 9.0   PT/INR No results for input(s): LABPROT, INR in the last 72 hours. ABG No results for input(s): PHART, HCO3 in the last 72 hours.  Invalid input(s): PCO2, PO2  UOP: 1.3L/24hrs; 400cc overnight JP: 50cc, serosang  Studies/Results:  Anti-infectives: Anti-infectives (From admission, onward)   Start     Dose/Rate Route Frequency Ordered Stop   07/12/18 1400  neomycin (MYCIFRADIN) tablet 1,000 mg  Status:  Discontinued     1,000 mg Oral 3 times per day 07/12/18 0736 07/12/18 0739   07/12/18 1400  metroNIDAZOLE (FLAGYL) tablet 1,000 mg  Status:  Discontinued     1,000 mg Oral 3 times per day 07/12/18 0736 07/12/18 0739   07/12/18 0745  cefoTEtan (CEFOTAN) 2 g in sodium chloride 0.9 % 100 mL IVPB     2 g 200 mL/hr over 30 Minutes Intravenous On call to O.R. 07/12/18 0736  07/12/18 0855       Assessment/Plan: Patient Active Problem List   Diagnosis Date Noted  . Benign neoplasm of transverse colon   . Benign neoplasm of descending colon   . Great toe pain, right 04/02/2018  . Rectal cancer ([place]) 03/06/2018  . Osteopenia 12/15/2017  . Cervicogenic headache 09/08/2017  . Rectal mass 08/25/2017  . Bilateral knee pain 08/25/2017  . Health care maintenance 09/04/2015  . Tobacco abuse 09/04/2015   s/p Procedure(s): LAPAROSCOPIC LOW ANTERIOR RESECTION WITH COLOPROSTOSTOMY, ERAS PATHWAY DIVERTING LOOP ILEOSTOMY FLEXIBLE SIGMOIDOSCOPY CYSTOSCOPY WITH STENT PLACEMENT 07/12/2018  -Clears, ok for full liquids as tolerated -Continue MIVF today -Continue foley catheter today given pelvic dissection; will plan removal tomorrow -Hypomagnesemia - replace today -Ambulate 5x/day; IS 10x/hr while awake -Continue Entereg until reliable stoma output -PPx: SQH, SCDs   LOS: 1 day   Sharon Mt. Dema Severin, M.D. General and Colorectal Surgery [facility]

## 2018-07-14 ENCOUNTER — Inpatient Hospital Stay (HOSPITAL_COMMUNITY): Payer: Medicare Other

## 2018-07-14 LAB — BASIC METABOLIC PANEL
ANION GAP: 9 (ref 5–15)
BUN: 12 mg/dL (ref 8–23)
CHLORIDE: 99 mmol/L (ref 98–111)
CO2: 29 mmol/L (ref 22–32)
Calcium: 8.7 mg/dL — ABNORMAL LOW (ref 8.9–10.3)
Creatinine, Ser: 1.04 mg/dL — ABNORMAL HIGH (ref 0.44–1.00)
GFR calc Af Amer: >60 mL/min (ref >60)
GFR calc non Af Amer: 54 mL/min — ABNORMAL LOW (ref >60)
GLUCOSE: 121 mg/dL — AB (ref 70–99)
POTASSIUM: 4 mmol/L (ref 3.5–5.1)
Sodium: 137 mmol/L (ref 135–145)

## 2018-07-14 LAB — CBC
HEMATOCRIT: 35.8 % — AB (ref 36.0–46.0)
HEMOGLOBIN: 12 g/dL (ref 12.0–15.0)
MCH: 32.6 pg (ref 26.0–34.0)
MCHC: 33.5 g/dL (ref 30.0–36.0)
MCV: 97.3 fL (ref 78.0–100.0)
Platelets: 323 10*3/uL (ref 150–400)
RBC: 3.68 MIL/uL — AB (ref 3.87–5.11)
RDW: 15.9 % — ABNORMAL HIGH (ref 11.5–15.5)
WBC: 17.8 10*3/uL — AB (ref 4.0–10.5)

## 2018-07-14 LAB — CREATININE, FLUID (PLEURAL, PERITONEAL, JP DRAINAGE): CREAT FL: 0.8 mg/dL

## 2018-07-14 LAB — PHOSPHORUS: PHOSPHORUS: 2.4 mg/dL — AB (ref 2.5–4.6)

## 2018-07-14 LAB — MAGNESIUM: Magnesium: 2.1 mg/dL (ref 1.7–2.4)

## 2018-07-14 NOTE — Progress Notes (Signed)
Patient refuses incentive spirometer, ambulation and OOB to chair. Patient cursing about being NPO and foley catheter not being discontinued, stating "I'm not doing any of that since the catheter is not coming out and they won't even let me drink anything". Attempt made to explain to patient that MDs are doing what they feel is best for her. Donne Hazel, RN

## 2018-07-14 NOTE — Care Management Note (Signed)
Case Management Note  Patient Details  Name: Deborah Arroyo MRN: [id number] Date of Birth: Apr 15, 1951  Subjective/Objective: Patient chose [name] for [name] aware. [name] has already delivered rollator to rm prior to d/c. Patient will transport home by family.Address where patient will PXTG:[address] [place],  [address].                  Action/Plan:d/c home w/HHC.   Expected Discharge Date:                  Expected Discharge Plan:  [place]  In-House Referral:     Discharge planning Services  CM Consult  Post Acute Care Choice:    Choice offered to:  Patient  DME Arranged:  Walker rolling with seat DME Agency:  [place]:  RN [facility] Agency:  [place]  Status of Service:  Completed, signed off  If discussed at [place] of Stay Meetings, dates discussed:    Additional Comments:  Dessa Phi, RN 07/14/2018, 3:08 PM

## 2018-07-14 NOTE — Progress Notes (Signed)
Physical Therapy Treatment Patient Details Name: Deborah Arroyo MRN: [id number] DOB: 1950/12/16 Today's Date: 07/14/2018    History of Present Illness Pt is a 67 YO female s/p cystoscopy with bilateral retrograde pyelogram and bilateral open-ended urethral catheter placement; laparoscopic low anterior resection with coloproctostomy, diverting loop ileostomy, flexible sigmoidoscopy, bilateral TAP blocks on 07/12/18. Surgeries secondary to rectal cancer, diagnosed May 2019. Pt with PMH including chemo/radiation for cancer earlier in 2019, cataracts, cervical cancer 1979, depression, dyspnea, hyponatremia with syncope, osteopenia, smoker. Past surgical history includes abdominal hysterectomy, cholecystectomy, L RTC repair.    PT Comments    Pt in bed on 3 lts nasal sats 96%.  Trial RA as assisted pt OOB to BSC.  C/o MAX dizziness.  Sats decreased to 64%.  Reapplied 3 lts with instruction on purse lip breathing sats returned to low 90's.  Pt  was unable to attempt amb after using BSC so assisted to recliner.    Follow Up Recommendations  Home health PT     Equipment Recommendations  Rolling walker with 5" wheels    Recommendations for Other Services       Precautions / Restrictions Precautions Precautions: Fall Precaution Comments: abdominal - Pt instructed in log rolling technique Restrictions Weight Bearing Restrictions: No    Mobility  Bed Mobility Overal bed mobility: Needs Assistance Bed Mobility: Rolling;Sidelying to Sit;Sit to Sidelying Rolling: Min assist Sidelying to sit: Min assist       General bed mobility comments: assist for bil LEs and increased time to complete   Transfers Overall transfer level: Needs assistance Equipment used: None Transfers: Sit to/from [facility] Sit to Stand: Min guard;Supervision Stand pivot transfers: Min guard;Min assist       General transfer comment: assist B LE off bed and increased time.  Max c/o dizziness when  sitting EOB.  Vitals taken BP 125/88, HR 96 RA 64%   Reapplied 3 lts to achieve stas 90%  Ambulation/Gait             General Gait Details: unable to attempt due to MAX dizziness and limited activity tolerance   Stairs             Wheelchair Mobility    Modified Rankin (Stroke Patients Only)       Balance                                            Cognition Arousal/Alertness: Awake/alert Behavior During Therapy: WFL for tasks assessed/performed Overall Cognitive Status: Within Functional Limits for tasks assessed                                        Exercises      General Comments        Pertinent Vitals/Pain Pain Assessment: Faces Faces Pain Scale: Hurts little more Pain Location: abdomen with activity Pain Descriptors / Indicators: Discomfort;Grimacing;Operative site guarding Pain Intervention(s): Monitored during session;Repositioned    Home Living                      Prior Function            PT Goals (current goals can now be found in the care plan section) Progress towards PT goals: Progressing toward goals  Frequency    Min 3X/week      PT Plan Current plan remains appropriate    Co-evaluation              AM-PAC PT "6 Clicks" Daily Activity  Outcome Measure  Difficulty turning over in bed (including adjusting bedclothes, sheets and blankets)?: Unable Difficulty moving from lying on back to sitting on the side of the bed? : Unable Difficulty sitting down on and standing up from a chair with arms (e.g., wheelchair, bedside commode, etc,.)?: Unable Help needed moving to and from a bed to chair (including a wheelchair)?: Total Help needed walking in hospital room?: Total Help needed climbing 3-5 steps with a railing? : Total 6 Click Score: 6    End of Session Equipment Utilized During Treatment: Oxygen;Gait belt Activity Tolerance: Patient limited by fatigue Patient left:  in chair;with call bell/phone within reach Nurse Communication: Mobility status PT Visit Diagnosis: Other abnormalities of gait and mobility (R26.89);Muscle weakness (generalized) (M62.81)     Time: 5397-6734 PT Time Calculation (min) (ACUTE ONLY): 27 min  Charges:  $Therapeutic Activity: 23-37 mins                     [name]  PTA Acute  Rehabilitation Services Pager      [phone] Office      [phone]

## 2018-07-14 NOTE — Progress Notes (Signed)
Occupational Therapy Treatment Patient Details Name: Deborah Arroyo MRN: [id number] DOB: 1951/01/10 Today's Date: 07/14/2018    History of present illness Pt is a 67 YO female s/p cystoscopy with bilateral retrograde pyelogram and bilateral open-ended urethral catheter placement; laparoscopic low anterior resection with coloproctostomy, diverting loop ileostomy, flexible sigmoidoscopy, bilateral TAP blocks on 07/12/18. Surgeries secondary to rectal cancer, diagnosed May 2019. Pt with PMH including chemo/radiation for cancer earlier in 2019, cataracts, cervical cancer 1979, depression, dyspnea, hyponatremia with syncope, osteopenia, smoker. Past surgical history includes abdominal hysterectomy, cholecystectomy, L RTC repair.   OT comments  Pt was up on her own, with one LOB just prior to sitting, requiring external support from therapist.  Sleepy once she returned to bed.  NT aware.  Follow Up Recommendations  Supervision/Assistance - 24 hour;Home health OT    Equipment Recommendations  (to be further assessed)    Recommendations for Other Services      Precautions / Restrictions Precautions Precautions: Fall Precaution Comments: abdominal - Pt instructed in log rolling technique Restrictions Weight Bearing Restrictions: No       Mobility Bed Mobility Overal bed mobility: Needs Assistance Bed Mobility: Rolling;Sidelying to Sit;Sit to Sidelying Rolling: Min assist      Sit to sidelying: Min assist General bed mobility comments: light assistance for legs  Transfers Overall transfer level: Needs assistance Equipment used: None Transfers: Sit to/from [facility] Sit to Stand: Min guard Stand pivot transfers: Min assist       General transfer comment: had LOB with SPT just prior to sitting    Balance                                           ADL either performed or assessed with clinical judgement   ADL                                          General ADL Comments: Pt had just gotten herself off of toilet when OT arrived. She reports that no one came to assist.  Checked with front desk---NT had been there 10 minutes prior and she did not call again.  JP bulb was not secured and short IV line would not reach as it was blocked behind chair.  Pt had LOB to R  just before she sat on bed.  Re-educated to call for assistance and bed alarm set.  Brought AE to show her for ADLs but she was driifting off to sleep     Vision       Perception     Praxis      Cognition Arousal/Alertness: Awake/alert Behavior During Therapy: WFL for tasks assessed/performed Overall Cognitive Status: Impaired/Different from baseline Area of Impairment: Safety/judgement                         Safety/Judgement: Decreased awareness of safety     General Comments: pt was up by herself when I arrived and was attempting to get back to bed with IV line getting tangled.          Exercises     Shoulder Instructions       General Comments      Pertinent Vitals/ Pain       Pain Assessment:  Faces Faces Pain Scale: Hurts little more Pain Location: abdomen with activity Pain Descriptors / Indicators: Discomfort;Grimacing;Operative site guarding Pain Intervention(s): Monitored during session;Limited activity within patient's tolerance;Premedicated before session  Home Living                                          Prior Functioning/Environment              Frequency  Min 2X/week        Progress Toward Goals  OT Goals(current goals can now be found in the care plan section)  Progress towards OT goals: Progressing toward goals     Plan      Co-evaluation                 AM-PAC PT "6 Clicks" Daily Activity     Outcome Measure   Help from another person eating meals?: None Help from another person taking care of personal grooming?: A Little Help from another person  toileting, which includes using toliet, bedpan, or urinal?: A Little Help from another person bathing (including washing, rinsing, drying)?: A Lot Help from another person to put on and taking off regular upper body clothing?: A Little Help from another person to put on and taking off regular lower body clothing?: A Lot 6 Click Score: 17    End of Session    OT Visit Diagnosis: Muscle weakness (generalized) (M62.81)   Activity Tolerance Patient limited by fatigue   Patient Left in bed;with call bell/phone within reach;with bed alarm set   Nurse Communication          Time: 8546-2703 OT Time Calculation (min): 10 min  Charges: OT General Charges $OT Visit: 1 Visit OT Treatments $Therapeutic Activity: 8-22 mins  Deborah Arroyo, OTR/L Acute Rehabilitation Services [phone] [facility] pager [phone] office 07/14/2018   Deborah Arroyo 07/14/2018, 3:11 PM

## 2018-07-14 NOTE — Progress Notes (Addendum)
Subjective Nausea/emesis x1 overnight. Productive cough. Getting up to chair but did not walk yesterday. Stoma began having gas and some green liquid output overnight - scant amount  Objective: Vital signs in last 24 hours: Temp:  [97.6 F (36.4 C)-98.9 F (37.2 C)] 98.6 F (37 C) (09/13 0607) Pulse Rate:  [73-97] 94 (09/13 0607) Resp:  [14-17] 14 (09/13 0607) BP: (109-126)/(58-76) 126/76 (09/13 0607) SpO2:  [91 %-99 %] 93 % (09/13 0607) Weight:  [51.3 kg] 51.3 kg (09/13 0607)    Intake/Output from previous day: 09/12 0701 - 09/13 0700 In: 2561.2 [P.O.:1260; I.V.:1193.4; IV Piggyback:107.8] Out: 3115 [Urine:2450; Drains:665] Intake/Output this shift: No intake/output data recorded.  Gen: NAD, comfortable CV: RRR Pulm: Normal work of breathing Abd: Incisions c/d/i without erythema. Abd soft, NT, moderately distended; loop ileostomy pink with red rubber in place. JP drain with serous output Ext: SCDs in place  Lab Results: CBC  Recent Labs    07/13/18 0422 07/14/18 0419  WBC 13.8* 17.8*  HGB 12.0 12.0  HCT 35.5* 35.8*  PLT 314 323   BMET Recent Labs    07/13/18 0422 07/14/18 0419  NA 133* 137  K 4.9 4.0  CL 96* 99  CO2 28 29  GLUCOSE 152* 121*  BUN 8 12  CREATININE 0.75 1.04*  CALCIUM 9.0 8.7*   PT/INR No results for input(s): LABPROT, INR in the last 72 hours. ABG No results for input(s): PHART, HCO3 in the last 72 hours.  Invalid input(s): PCO2, PO2  UOP: 2.4L/24hrs JP: 665cc/24hrs, serous Ileostomy: not recorded; gas and stool in appliance  Studies/Results:  Anti-infectives: Anti-infectives (From admission, onward)   Start     Dose/Rate Route Frequency Ordered Stop   07/12/18 1400  neomycin (MYCIFRADIN) tablet 1,000 mg  Status:  Discontinued     1,000 mg Oral 3 times per day 07/12/18 0736 07/12/18 0739   07/12/18 1400  metroNIDAZOLE (FLAGYL) tablet 1,000 mg  Status:  Discontinued     1,000 mg Oral 3 times per day 07/12/18 0736 07/12/18 0739   07/12/18 0745  cefoTEtan (CEFOTAN) 2 g in sodium chloride 0.9 % 100 mL IVPB     2 g 200 mL/hr over 30 Minutes Intravenous On call to O.R. 07/12/18 0736 07/12/18 0855       Assessment/Plan: Patient Active Problem List   Diagnosis Date Noted  . Benign neoplasm of transverse colon   . Benign neoplasm of descending colon   . Great toe pain, right 04/02/2018  . Rectal cancer ([place]) 03/06/2018  . Osteopenia 12/15/2017  . Cervicogenic headache 09/08/2017  . Rectal mass 08/25/2017  . Bilateral knee pain 08/25/2017  . Health care maintenance 09/04/2015  . Tobacco abuse 09/04/2015   s/p Procedure(s): LAPAROSCOPIC LOW ANTERIOR RESECTION WITH COLOPROSTOSTOMY, ERAS PATHWAY DIVERTING LOOP ILEOSTOMY FLEXIBLE SIGMOIDOSCOPY CYSTOSCOPY WITH STENT PLACEMENT 07/12/2018  -NPO until reliable output and distention improved -Continue MIVF today -Will get CXR today as well for cough and leukocytosis - she is a smoker and certainly at risk for pneumonia -Drain creatinine normal; D/C foley -Ambulate 5x/day; IS 10x/hr while awake -Continue Entereg until reliable stoma output -PPx: SQH, SCDs   LOS: 2 days   Sharon Mt. Dema Severin, M.D. General and Colorectal Surgery [facility]

## 2018-07-14 NOTE — Discharge Instructions (Addendum)
POST OP INSTRUCTIONS AFTER COLON SURGERY  1. DIET: Be sure to include lots of fluids daily to stay hydrated - 64oz of water (or gatorade) per day (8, 8 oz glasses). With your new ileostomy, dehydration is the most common issue that arises that results in readmission to the hospital. Avoid fast food or heavy meals for the first couple of weeks as your are more likely to get nauseated. Avoid raw/uncooked fruits or vegetables for the first 4 weeks (it's ok to have these if they are blended into smoothie form). If you have fruits/vegetables, make sure they are cooked until soft enough to mash on the roof of your mouth and chew your food well. Otherwise, diet as tolerated.  2. Take your usually prescribed home medications unless otherwise directed.  3. PAIN CONTROL: a. Pain is best controlled by a usual combination of three different methods TOGETHER: i. Ice/Heat ii. Over the counter pain medication iii. Prescription pain medication b. Most patients will experience some swelling and bruising around the surgical site.  Ice packs or heating pads (30-60 minutes up to 6 times a day) will help. Some people prefer to use ice alone, heat alone, alternating between ice & heat.  Experiment to what works for you.  Swelling and bruising can take several weeks to resolve.   c. It is helpful to take an over-the-counter pain medication regularly for the first few weeks: i. Ibuprofen (Motrin/Advil) - 200mg  tabs - take 3 tabs (600mg ) every 6 hours as needed for pain (unless you have been directed previously to avoid NSAIDs/ibuprofen) ii. Acetaminophen (Tylenol) - you may take 650mg  every 6 hours as needed. You can take this with motrin as they act differently on the body. If you are taking a narcotic pain medication that has acetaminophen in it, do not take over the counter tylenol at the same time. iii. NOTE: You may take both of these medications together - most patients  find it most helpful when alternating between  the two (i.e. Ibuprofen at 6am, tylenol at 9am, ibuprofen at 12pm ...) iv. Take Imodium 4 times daily (1 tablet 30 minutes before each of your meals and then another tablet at bedtime) v. Take a fiber supplement twice daily (fibercon tablets, etc) - this will help thicken your ileostomy output d. A  prescription for pain medication should be given to you upon discharge.  Take your pain medication as prescribed if your pain is not adequatly controlled with the over-the-counter pain reliefs mentioned above.  4. Ileostomy:  You should monitor the amount coming out for the coming months. You need to empty the bag into a measuring device and record the output every time. If the output is in excess of 1231mL (36 oz) in 24hrs, it is very important you let us know as there are medications we can give you to slow this down. Outputs in excess of 1238mL (36 oz) in 24hrs places you at high risk for dehydration which can lead to kidney damage. You should cut an apply your bag as taught while you were in the hospital.   5. Dressing: Your incisions are covered in Dermabond which is like sterile superglue for the skin. This will come off on it's own in a couple weeks. It is waterproof and you may bathe normally in a shower. Avoid baths/pools/lakes/oceans until your wounds have fully healed.  6. ACTIVITIES as tolerated:   a. Avoid heavy lifting (>10lbs or 1 gallon of milk) for the next 6 weeks. b. You may  resume regular daily activities as tolerated--such as daily self-care, walking, climbing stairs--gradually increasing activities as tolerated.  If you can walk 30 minutes without difficulty, it is safe to try more intense activity such as jogging, treadmill, bicycling, low-impact aerobics.  c. DO NOT PUSH THROUGH PAIN.  Let pain be your guide: If it hurts to do something, don't do it. d. Dennis Bast may drive when you are no longer taking prescription pain medication, you can comfortably wear a seatbelt, and you can safely  maneuver your car and apply brakes.  7. FOLLOW UP in our office a. Please call [facility] at [phone] to set up an appointment to see your surgeon in the office for a follow-up appointment approximately 2 weeks after your surgery. b. Make sure that you call for this appointment the day you arrive home to insure a convenient appointment time.  9. If you have disability or family leave forms that need to be completed, you may have them completed by your primary care physician's office; for return to work instructions, please ask our office staff and they will be happy to assist you in obtaining this documentation   When to call us [phone]: 1. Poor pain control 2. Reactions / problems with new medications (rash/itching, etc)  3. Fever over 101.5 F (38.5 C) 4. Inability to urinate 5. Nausea/vomiting 6. Worsening swelling or bruising 7. Continued bleeding from incision. 8. Increased pain, redness, or drainage from the incision  The clinic staff is available to answer your questions during regular business hours (8:30am-5pm).  Please dont hesitate to call and ask to speak to one of our nurses for clinical concerns.   A surgeon from [facility] is always on call at the hospitals   If you have a medical emergency, go to the nearest emergency room or call 911.  [facility], [place] [address] [place], [place], [place]  [address] MAIN: [phone] FAX: [phone] [email or web address]

## 2018-07-14 NOTE — Progress Notes (Signed)
Emesis x1 overnight. Complaints of back pain,  Reduced to a 2 this a.m. Vitals stable. Foley in place.

## 2018-07-15 LAB — BASIC METABOLIC PANEL
ANION GAP: 6 (ref 5–15)
BUN: 11 mg/dL (ref 8–23)
CALCIUM: 8.8 mg/dL — AB (ref 8.9–10.3)
CO2: 30 mmol/L (ref 22–32)
Chloride: 104 mmol/L (ref 98–111)
Creatinine, Ser: 0.77 mg/dL (ref 0.44–1.00)
GFR calc Af Amer: >60 mL/min (ref >60)
Glucose, Bld: 116 mg/dL — ABNORMAL HIGH (ref 70–99)
Potassium: 4.2 mmol/L (ref 3.5–5.1)
Sodium: 140 mmol/L (ref 135–145)

## 2018-07-15 LAB — PHOSPHORUS: Phosphorus: 2.9 mg/dL (ref 2.5–4.6)

## 2018-07-15 LAB — CBC
HEMATOCRIT: 40.2 % (ref 36.0–46.0)
Hemoglobin: 13.1 g/dL (ref 12.0–15.0)
MCH: 32.4 pg (ref 26.0–34.0)
MCHC: 32.6 g/dL (ref 30.0–36.0)
MCV: 99.5 fL (ref 78.0–100.0)
PLATELETS: 330 10*3/uL (ref 150–400)
RBC: 4.04 MIL/uL (ref 3.87–5.11)
RDW: 16 % — AB (ref 11.5–15.5)
WBC: 14.7 10*3/uL — ABNORMAL HIGH (ref 4.0–10.5)

## 2018-07-15 LAB — MAGNESIUM: Magnesium: 1.9 mg/dL (ref 1.7–2.4)

## 2018-07-15 NOTE — Progress Notes (Signed)
Assessment & Plan: POD#3 - status post low anterior resection with diverting loop ileostomy  Begin clear liquid diet  Encouraged OOB, ambulation in halls        Earnstine Regal, MD, [facility]       Office: [phone]   Chief Complaint: Rectal carcinoma  Subjective: Patient in bed, pleasant, "thirsty".  States ambulated last night.  Objective: Vital signs in last 24 hours: Temp:  [97.9 F (36.6 C)-98.7 F (37.1 C)] 97.9 F (36.6 C) (09/14 0439) Pulse Rate:  [86-98] 86 (09/14 0439) Resp:  [14-18] 18 (09/14 0439) BP: (113-135)/(77-88) 113/88 (09/14 0439) SpO2:  [93 %-97 %] 97 % (09/14 0439) Weight:  [50.2 kg] 50.2 kg (09/14 0500) Last BM Date: 07/14/18(liquid stool, dark greenish brown color)  Intake/Output from previous day: 09/13 0701 - 09/14 0700 In: 1944.2 [I.V.:1944.2] Out: 3040 [Urine:1050; Drains:1440; Stool:550] Intake/Output this shift: No intake/output data recorded.  Physical Exam: HEENT - sclerae clear, mucous membranes moist Neck - soft Chest - clear bilaterally Cor - RRR Abdomen - soft, mild distension; active BS present; wounds dry and intact; thin green liquid in ostomy bag Ext - no edema, non-tender Neuro - alert & oriented, no focal deficits  Lab Results:  Recent Labs    07/14/18 0419 07/15/18 0417  WBC 17.8* 14.7*  HGB 12.0 13.1  HCT 35.8* 40.2  PLT 323 330   BMET Recent Labs    07/14/18 0419 07/15/18 0417  NA 137 140  K 4.0 4.2  CL 99 104  CO2 29 30  GLUCOSE 121* 116*  BUN 12 11  CREATININE 1.04* 0.77  CALCIUM 8.7* 8.8*   PT/INR No results for input(s): LABPROT, INR in the last 72 hours. Comprehensive Metabolic Panel:    Component Value Date/Time   NA 140 07/15/2018 0417   NA 137 07/14/2018 0419   NA 135 08/24/2017 1531   K 4.2 07/15/2018 0417   K 4.0 07/14/2018 0419   CL 104 07/15/2018 0417   CL 99 07/14/2018 0419   CO2 30 07/15/2018 0417   CO2 29 07/14/2018 0419   BUN 11  07/15/2018 0417   BUN 12 07/14/2018 0419   BUN 9 08/24/2017 1531   CREATININE 0.77 07/15/2018 0417   CREATININE 1.04 (H) 07/14/2018 0419   CREATININE 0.71 04/14/2018 1117   CREATININE 0.73 03/22/2018 1012   GLUCOSE 116 (H) 07/15/2018 0417   GLUCOSE 121 (H) 07/14/2018 0419   GLUCOSE 95 03/28/2014 1224   CALCIUM 8.8 (L) 07/15/2018 0417   CALCIUM 8.7 (L) 07/14/2018 0419   AST 22 06/28/2018 1156   AST 15 04/28/2018 1407   AST 13 04/14/2018 1117   AST 17 03/22/2018 1012   ALT 13 06/28/2018 1156   ALT 10 04/28/2018 1407   ALT <6 04/14/2018 1117   ALT 11 03/22/2018 1012   ALKPHOS 111 06/28/2018 1156   ALKPHOS 101 04/28/2018 1407   BILITOT 0.6 06/28/2018 1156   BILITOT 0.4 04/28/2018 1407   BILITOT 0.3 04/14/2018 1117   BILITOT 0.2 03/22/2018 1012   PROT 7.6 06/28/2018 1156   PROT 7.0 04/28/2018 1407   PROT 6.9 08/24/2017 1531   ALBUMIN 4.2 06/28/2018 1156   ALBUMIN 3.8 04/28/2018 1407   ALBUMIN 4.4 08/24/2017 1531    Studies/Results: Dg Chest Port 1 View  Result Date: 07/14/2018 CLINICAL DATA:  Cough EXAM: PORTABLE CHEST 1 VIEW COMPARISON:  Chest CT 03/03/2018.  Chest x-ray 12/10/2008 FINDINGS: Mild  cardiomegaly. Mild interstitial prominence throughout the lungs. Patchy bilateral lower lobe airspace opacities. No definite visible significant effusions or acute bony abnormality. IMPRESSION: Cardiomegaly. Mild diffuse interstitial prominence throughout the lungs with patchy bilateral lower lobe airspace opacities. Findings could reflect interstitial edema. Cannot exclude pneumonia in the lung bases. Electronically Signed   By: Rolm Baptise Arroyo.D.   On: 07/14/2018 08:44      Deborah Arroyo 07/15/2018  Patient ID: Deborah Arroyo, female   DOB: 10-13-1951, 67 y.o.   MRN: [id number]

## 2018-07-15 NOTE — Plan of Care (Signed)
Pt sitting up in chair this morning. C/O back pain and requests pain medication - given by night nurse prior to shift change. Hot packs in place to lower back. No additional complaints or concerns noted at this time. Will continue to monitor.

## 2018-07-16 LAB — BASIC METABOLIC PANEL
ANION GAP: 6 (ref 5–15)
BUN: 9 mg/dL (ref 8–23)
CO2: 27 mmol/L (ref 22–32)
CREATININE: 0.66 mg/dL (ref 0.44–1.00)
Calcium: 8.6 mg/dL — ABNORMAL LOW (ref 8.9–10.3)
Chloride: 105 mmol/L (ref 98–111)
Glucose, Bld: 95 mg/dL (ref 70–99)
Potassium: 3.8 mmol/L (ref 3.5–5.1)
SODIUM: 138 mmol/L (ref 135–145)

## 2018-07-16 LAB — CBC
HEMATOCRIT: 37.9 % (ref 36.0–46.0)
Hemoglobin: 12.6 g/dL (ref 12.0–15.0)
MCH: 32.6 pg (ref 26.0–34.0)
MCHC: 33.2 g/dL (ref 30.0–36.0)
MCV: 97.9 fL (ref 78.0–100.0)
PLATELETS: 359 10*3/uL (ref 150–400)
RBC: 3.87 MIL/uL (ref 3.87–5.11)
RDW: 15.7 % — AB (ref 11.5–15.5)
WBC: 12.7 10*3/uL — AB (ref 4.0–10.5)

## 2018-07-16 LAB — PHOSPHORUS: PHOSPHORUS: 3.2 mg/dL (ref 2.5–4.6)

## 2018-07-16 LAB — MAGNESIUM: MAGNESIUM: 1.7 mg/dL (ref 1.7–2.4)

## 2018-07-16 NOTE — Plan of Care (Signed)
Pt resting in bed this morning. Needs to get up to [facility]. No complaints or concerns voiced at this time. Will continue to monitor.

## 2018-07-16 NOTE — Progress Notes (Signed)
Assessment & Plan: POD#4 - status post low anterior resection with diverting loop ileostomy             advance to full liquid diet             encouraged OOB, ambulation in halls        Earnstine Regal, MD, [facility]       Office: [phone]   Chief Complaint: Low anterior resection  Subjective: Patient in bed, no complaints.  Having output from ileostomy.  Has not ambulated.  Objective: Vital signs in last 24 hours: Temp:  [98 F (36.7 C)-99.9 F (37.7 C)] 98.1 F (36.7 C) (09/15 0559) Pulse Rate:  [65-91] 65 (09/15 0559) Resp:  [16-18] 16 (09/15 0559) BP: (126-146)/(73-107) 141/77 (09/15 0559) SpO2:  [97 %-99 %] 99 % (09/15 0559) Weight:  [51 kg] 51 kg (09/15 0559) Last BM Date: 07/15/18  Intake/Output from previous day: 09/14 0701 - 09/15 0700 In: 2392.9 [P.O.:600; I.V.:1792.9] Out: 4160 [ZHYQM:5784; Drains:885; ONGEX:5284] Intake/Output this shift: Total I/O In: -  Out: 380 [Urine:50; Drains:80; Stool:250]  Physical Exam: HEENT - sclerae clear, mucous membranes moist Neck - soft Chest - clear bilaterally Cor - RRR Abdomen - soft, ileostomy viable with liquid output; serosanguinous, thin, in JP drain Ext - no edema, non-tender Neuro - alert & oriented, no focal deficits  Lab Results:  Recent Labs    07/15/18 0417 07/16/18 0404  WBC 14.7* 12.7*  HGB 13.1 12.6  HCT 40.2 37.9  PLT 330 359   BMET Recent Labs    07/15/18 0417 07/16/18 0404  NA 140 138  K 4.2 3.8  CL 104 105  CO2 30 27  GLUCOSE 116* 95  BUN 11 9  CREATININE 0.77 0.66  CALCIUM 8.8* 8.6*   PT/INR No results for input(s): LABPROT, INR in the last 72 hours. Comprehensive Metabolic Panel:    Component Value Date/Time   NA 138 07/16/2018 0404   NA 140 07/15/2018 0417   NA 135 08/24/2017 1531   K 3.8 07/16/2018 0404   K 4.2 07/15/2018 0417   CL 105 07/16/2018 0404   CL 104 07/15/2018 0417   CO2 27 07/16/2018 0404   CO2 30 07/15/2018  0417   BUN 9 07/16/2018 0404   BUN 11 07/15/2018 0417   BUN 9 08/24/2017 1531   CREATININE 0.66 07/16/2018 0404   CREATININE 0.77 07/15/2018 0417   CREATININE 0.71 04/14/2018 1117   CREATININE 0.73 03/22/2018 1012   GLUCOSE 95 07/16/2018 0404   GLUCOSE 116 (H) 07/15/2018 0417   GLUCOSE 95 03/28/2014 1224   CALCIUM 8.6 (L) 07/16/2018 0404   CALCIUM 8.8 (L) 07/15/2018 0417   AST 22 06/28/2018 1156   AST 15 04/28/2018 1407   AST 13 04/14/2018 1117   AST 17 03/22/2018 1012   ALT 13 06/28/2018 1156   ALT 10 04/28/2018 1407   ALT <6 04/14/2018 1117   ALT 11 03/22/2018 1012   ALKPHOS 111 06/28/2018 1156   ALKPHOS 101 04/28/2018 1407   BILITOT 0.6 06/28/2018 1156   BILITOT 0.4 04/28/2018 1407   BILITOT 0.3 04/14/2018 1117   BILITOT 0.2 03/22/2018 1012   PROT 7.6 06/28/2018 1156   PROT 7.0 04/28/2018 1407   PROT 6.9 08/24/2017 1531   ALBUMIN 4.2 06/28/2018 1156   ALBUMIN 3.8 04/28/2018 1407   ALBUMIN 4.4 08/24/2017 1531    Studies/Results: No results found.  Deborah Arroyo 07/16/2018  Patient ID: Deborah Arroyo, female   DOB: 1951-06-19, 67 y.o.   MRN: [id number]

## 2018-07-17 LAB — BASIC METABOLIC PANEL
Anion gap: 10 (ref 5–15)
BUN: 11 mg/dL (ref 8–23)
CHLORIDE: 100 mmol/L (ref 98–111)
CO2: 25 mmol/L (ref 22–32)
CREATININE: 0.68 mg/dL (ref 0.44–1.00)
Calcium: 8.2 mg/dL — ABNORMAL LOW (ref 8.9–10.3)
GFR calc Af Amer: >60 mL/min (ref >60)
GFR calc non Af Amer: >60 mL/min (ref >60)
GLUCOSE: 94 mg/dL (ref 70–99)
Potassium: 3.2 mmol/L — ABNORMAL LOW (ref 3.5–5.1)
Sodium: 135 mmol/L (ref 135–145)

## 2018-07-17 LAB — PHOSPHORUS: Phosphorus: 3.4 mg/dL (ref 2.5–4.6)

## 2018-07-17 LAB — MAGNESIUM: Magnesium: 1.6 mg/dL — ABNORMAL LOW (ref 1.7–2.4)

## 2018-07-17 MED ORDER — POTASSIUM CHLORIDE 20 MEQ PO PACK
60.0000 meq | PACK | Freq: Once | ORAL | Status: AC
  Administered 2018-07-17: 60 meq via ORAL
  Filled 2018-07-17: qty 3

## 2018-07-17 MED ORDER — PSYLLIUM 95 % PO PACK: 1.0000 | PACK | Freq: Every day | ORAL | Status: DC

## 2018-07-17 MED ORDER — PSYLLIUM 95 % PO PACK
1.0000 | PACK | Freq: Two times a day (BID) | ORAL | Status: DC
  Administered 2018-07-17 – 2018-07-18 (×2): 1 via ORAL
  Filled 2018-07-17 (×2): qty 1

## 2018-07-17 MED ORDER — MAGNESIUM SULFATE 4 GM/100ML IV SOLN
4.0000 g | Freq: Once | INTRAVENOUS | Status: AC
  Administered 2018-07-17: 4 g via INTRAVENOUS
  Filled 2018-07-17: qty 100

## 2018-07-17 NOTE — Consult Note (Addendum)
[facility] Nurse ostomy follow up Ileostomy surgery performed on 9/11. Pt states she has been emptying her pouch without assistance. Stoma type/location: Stoma is red and viable, above skin level, 1 1/4 inches.  Removed red rubber rod as ordered by the surgical team. Peristomal assessment: Intact skin surrounding stoma Output: 100cc green liquid stool  Ostomy pouching: 2pc.  Education provided:  Pt assisted with pouch application, using barrier ring and 2 piece pouching system.  Supplies and educational materials left at the bedside.  She could benefit from home health assistance after discharge.  Reviewed pouching routines, ordering supplies, and dietary precautions.  Pt asked appropriate questions. Enrolled patient in [place] Start Discharge program: Yes, last week.   [place] team will continue to follow for further teaching session while in the hospital. Julien Girt MSN, RN, [place], [place], [place]

## 2018-07-17 NOTE — Progress Notes (Signed)
Physical Therapy Treatment Patient Details Name: Deborah Arroyo MRN: [id number] DOB: [birth year] Today's Date: 07/17/2018    History of Present Illness Pt is a 67 YO female s/p cystoscopy with bilateral retrograde pyelogram and bilateral open-ended urethral catheter placement; laparoscopic low anterior resection with coloproctostomy, diverting loop ileostomy, flexible sigmoidoscopy, bilateral TAP blocks on 07/12/18. Surgeries secondary to rectal cancer, diagnosed May 2019. Pt with PMH including chemo/radiation for cancer earlier in 2019, cataracts, cervical cancer 1979, depression, dyspnea, hyponatremia with syncope, osteopenia, smoker. Past surgical history includes abdominal hysterectomy, cholecystectomy, L RTC repair.    PT Comments    Pt ambulated increased distance today with one standing rest break to recover sats. Pt requiring less physical assist for bed mobility and transfers this session. Pt's limiting factor at this time is abdominal pain. Will continue to follow acutely and progress mobility as able.    Follow Up Recommendations  Home health PT     Equipment Recommendations  Rolling walker with 5" wheels    Recommendations for Other Services       Precautions / Restrictions Precautions Precautions: Fall Precaution Comments: abdominal - log rolling technique Restrictions Weight Bearing Restrictions: No    Mobility  Bed Mobility Overal bed mobility: Needs Assistance Bed Mobility: Rolling;Sidelying to Sit Rolling: Supervision Sidelying to sit: Supervision       General bed mobility comments: supervision for all bed mobility; increased time and effort to perform   Transfers Overall transfer level: Needs assistance Equipment used: Rolling walker (2 wheeled) Transfers: Sit to/from Stand Sit to Stand: Min guard         General transfer comment: min guard for safety, pt self-steadied upon standing with use of RW. sit to stand x2, once from bed and once from  [facility]  Ambulation/Gait Ambulation/Gait assistance: Min guard Gait Distance (Feet): 175 Feet Assistive device: Rolling walker (2 wheeled) Gait Pattern/deviations: Step-through pattern;Decreased stride length;Trunk flexed Gait velocity: decr   General Gait Details: Pt with mild dizziness upon first 10 ft ambulation, resolved. Verbal cuing for breathing in through nose when dsypneic halfway through ambulation. 1 standing rest break to recover breathing.    Stairs             Wheelchair Mobility    Modified Rankin (Stroke Patients Only)       Balance Overall balance assessment: Mild deficits observed, not formally tested                                          Cognition Arousal/Alertness: Awake/alert Behavior During Therapy: WFL for tasks assessed/performed Overall Cognitive Status: Within Functional Limits for tasks assessed                                        Exercises      General Comments        Pertinent Vitals/Pain Pain Assessment: 0-10 Pain Score: 6  Pain Location: abdomen with activity Pain Descriptors / Indicators: Grimacing;Guarding;Sore Pain Intervention(s): Limited activity within patient's tolerance;Repositioned;Monitored during session    Home Living                      Prior Function            PT Goals (current goals can now be found in the care plan section)  Acute Rehab PT Goals PT Goal Formulation: With patient Time For Goal Achievement: 07/27/18 Potential to Achieve Goals: Good Progress towards PT goals: Progressing toward goals    Frequency    Min 3X/week      PT Plan Current plan remains appropriate    Co-evaluation              AM-PAC PT "6 Clicks" Daily Activity  Outcome Measure  Difficulty turning over in bed (including adjusting bedclothes, sheets and blankets)?: A Little Difficulty moving from lying on back to sitting on the side of the bed? : A Little Difficulty  sitting down on and standing up from a chair with arms (e.g., wheelchair, bedside commode, etc,.)?: Unable Help needed moving to and from a bed to chair (including a wheelchair)?: A Little Help needed walking in hospital room?: A Little Help needed climbing 3-5 steps with a railing? : A Lot 6 Click Score: 15    End of Session Equipment Utilized During Treatment: Oxygen;Gait belt(3L O2 via [place]) Activity Tolerance: Patient tolerated treatment well;Patient limited by pain Patient left: with call bell/phone within reach;in chair;with chair alarm set;with SCD's reapplied Nurse Communication: Mobility status PT Visit Diagnosis: Other abnormalities of gait and mobility (R26.89);Muscle weakness (generalized) (M62.81)     Time: 7579-7282 PT Time Calculation (min) (ACUTE ONLY): 24 min  Charges:  $Gait Training: 8-22 mins $Therapeutic Activity: 8-22 mins                     Julien Girt, PT Acute Rehabilitation Services Pager [phone]  Office [phone]   Jakeira Seeman D Elonda Husky 07/17/2018, 3:29 PM

## 2018-07-17 NOTE — Care Management Important Message (Signed)
Important Message  Patient Details  Name: Deborah Arroyo MRN: [id number] Date of Birth: [birth year]   Medicare Important Message Given:  Yes    Kerin Salen 07/17/2018, 10:16 AMImportant Message  Patient Details  Name: Deborah Arroyo MRN: [id number] Date of Birth: [birth year]   Medicare Important Message Given:  Yes    Kerin Salen 07/17/2018, 10:16 AM

## 2018-07-17 NOTE — Progress Notes (Signed)
Subjective Denies n/v. Tolerating full liquids without issue. Copious thin/watery bilious output from ileostomy. Getting up to bathroom and around room. Denies complaints this morning.  Objective: Vital signs in last 24 hours: Temp:  [97.8 F (36.6 C)-98.8 F (37.1 C)] 98.1 F (36.7 C) (09/16 0439) Pulse Rate:  [60-79] 79 (09/16 0439) Resp:  [16-18] 18 (09/16 0439) BP: (127-133)/(67-74) 131/74 (09/16 0439) SpO2:  [94 %-100 %] 94 % (09/16 0439) Weight:  [50.2 kg] 50.2 kg (09/16 0559) Last BM Date: 07/15/18  Intake/Output from previous day: 09/15 0701 - 09/16 0700 In: 2755.5 [P.O.:965; I.V.:1790.5] Out: 3149 [Urine:825; Drains:349; ALPFX:9024] Intake/Output this shift: No intake/output data recorded.  Gen: NAD, comfortable CV: RRR Pulm: Normal work of breathing Abd: Incisions c/d/i without erythema. Abd soft, NT/ND; loop ileostomy pink with red rubber in place - thin green output in appliance. JP drain with clear serous output Ext: SCDs in place  Lab Results: CBC  Recent Labs    07/15/18 0417 07/16/18 0404  WBC 14.7* 12.7*  HGB 13.1 12.6  HCT 40.2 37.9  PLT 330 359   BMET Recent Labs    07/16/18 0404 07/17/18 0400  NA 138 135  K 3.8 3.2*  CL 105 100  CO2 27 25  GLUCOSE 95 94  BUN 9 11  CREATININE 0.66 0.68  CALCIUM 8.6* 8.2*   PT/INR No results for input(s): LABPROT, INR in the last 72 hours. ABG No results for input(s): PHART, HCO3 in the last 72 hours.  Invalid input(s): PCO2, PO2  UOP: 2.4L/24hrs JP: 665cc/24hrs, serous Ileostomy: not recorded; gas and stool in appliance  Studies/Results:  Anti-infectives: Anti-infectives (From admission, onward)   Start     Dose/Rate Route Frequency Ordered Stop   07/12/18 1400  neomycin (MYCIFRADIN) tablet 1,000 mg  Status:  Discontinued     1,000 mg Oral 3 times per day 07/12/18 0736 07/12/18 0739   07/12/18 1400  metroNIDAZOLE (FLAGYL) tablet 1,000 mg  Status:  Discontinued     1,000 mg Oral 3 times per  day 07/12/18 0736 07/12/18 0739   07/12/18 0745  cefoTEtan (CEFOTAN) 2 g in sodium chloride 0.9 % 100 mL IVPB     2 g 200 mL/hr over 30 Minutes Intravenous On call to O.R. 07/12/18 0736 07/12/18 0855       Assessment/Plan: Patient Active Problem List   Diagnosis Date Noted  . Benign neoplasm of transverse colon   . Benign neoplasm of descending colon   . Great toe pain, right 04/02/2018  . Rectal cancer ([place]) 03/06/2018  . Osteopenia 12/15/2017  . Cervicogenic headache 09/08/2017  . Rectal mass 08/25/2017  . Bilateral knee pain 08/25/2017  . Health care maintenance 09/04/2015  . Tobacco abuse 09/04/2015   s/p Procedure(s): LAPAROSCOPIC LOW ANTERIOR RESECTION WITH COLOPROSTOSTOMY, ERAS PATHWAY DIVERTING LOOP ILEOSTOMY FLEXIBLE SIGMOIDOSCOPY CYSTOSCOPY WITH STENT PLACEMENT 07/12/2018  POD#5  -Advance to soft diet today -Continue MIVF today given ostomy output - anticipate this will slow now that she's on a diet -Start BID metamucil to help thicken output as well -WOCN following for new ileostomy education/care. Will need to empty/record 24hr outputs - goal <1.2L/24hrs. Will need to demonstrate ability to do this prior to discharge -Hypokalemia - replace with 54mEq PO potassium -Hypomagnesemia - 4g magsulfate IV today -PT/OT; recommending home OT; also needs home health RN for new ileostomy. She lives with her 2 sons and brother whom are willing to help with the home transition -Ambulate 5x/day; IS 10x/hr while awake -PPx: SQH, SCDs  LOS: 5 days   Sharon Mt. Dema Severin, M.D. General and Colorectal Surgery [facility]

## 2018-07-17 NOTE — Progress Notes (Addendum)
Occupational Therapy Treatment Patient Details Name: Deborah Arroyo MRN: [id number] DOB: Sep 11, 1951 Today's Date: 07/17/2018    History of present illness Pt is a 67 YO female s/p cystoscopy with bilateral retrograde pyelogram and bilateral open-ended urethral catheter placement; laparoscopic low anterior resection with coloproctostomy, diverting loop ileostomy, flexible sigmoidoscopy, bilateral TAP blocks on 07/12/18. Surgeries secondary to rectal cancer, diagnosed May 2019. Pt with PMH including chemo/radiation for cancer earlier in 2019, cataracts, cervical cancer 1979, depression, dyspnea, hyponatremia with syncope, osteopenia, smoker. Past surgical history includes abdominal hysterectomy, cholecystectomy, L RTC repair.   OT comments  Pt agreed to AE next OT visit  Follow Up Recommendations  Supervision/Assistance - 24 hour;Home health OT    Equipment Recommendations  3 in 1 bedside commode    Recommendations for Other Services      Precautions / Restrictions Precautions Precautions: Fall Precaution Comments: abdominal - log rolling technique Restrictions Weight Bearing Restrictions: No       Mobility Bed Mobility Overal bed mobility: Needs Assistance Bed Mobility: Rolling;Sidelying to Sit Rolling: Supervision Sidelying to sit: Supervision       General bed mobility comments: pt in chair  Transfers Overall transfer level: Needs assistance Equipment used: Rolling walker (2 wheeled) Transfers: Sit to/from Stand Sit to Stand: Min assist         General transfer comment: Vc for hand placement and increased time    Balance Overall balance assessment: Mild deficits observed, not formally tested                                         ADL either performed or assessed with clinical judgement   ADL Overall ADL's : Needs assistance/impaired Eating/Feeding: Set up;Sitting   Grooming: Wash/dry face;Brushing hair;Set up               Lower  Body Dressing: Moderate assistance;Sit to/from stand;Cueing for safety;Cueing for compensatory techniques Lower Body Dressing Details (indicate cue type and reason): socks- will try AE                      Vision Patient Visual Report: No change from baseline     Perception     Praxis      Cognition Arousal/Alertness: Awake/alert Behavior During Therapy: WFL for tasks assessed/performed Overall Cognitive Status: Within Functional Limits for tasks assessed                                          Exercises     Shoulder Instructions       General Comments      Pertinent Vitals/ Pain       Pain Assessment: 0-10 Pain Score: 4  Pain Location: abdomen with activity Pain Descriptors / Indicators: Grimacing;Guarding;Sore Pain Intervention(s): Limited activity within patient's tolerance;Repositioned(placed pillow at pts back)  Home Living                                          Prior Functioning/Environment              Frequency  Min 2X/week        Progress Toward Goals  OT Goals(current goals can now be found in  the care plan section)  Progress towards OT goals: Progressing toward goals     Plan Discharge plan remains appropriate    Co-evaluation                 AM-PAC PT "6 Clicks" Daily Activity     Outcome Measure   Help from another person eating meals?: None Help from another person taking care of personal grooming?: A Little Help from another person toileting, which includes using toliet, bedpan, or urinal?: A Little Help from another person bathing (including washing, rinsing, drying)?: A Little Help from another person to put on and taking off regular upper body clothing?: A Little Help from another person to put on and taking off regular lower body clothing?: A Lot 6 Click Score: 18    End of Session    OT Visit Diagnosis: Unsteadiness on feet (R26.81);Muscle weakness (generalized)  (M62.81);History of falling (Z91.81)   Activity Tolerance Patient tolerated treatment well   Patient Left in bed;with call bell/phone within reach;with bed alarm set;in chair   Nurse Communication Mobility status        Time: 6283-6629 OT Time Calculation (min): 11 min  Charges: OT General Charges $OT Visit: 1 Visit OT Evaluation $OT Eval Moderate Complexity: 1 Mod   Wrong charge entered- should be [place], [place]   Payton Mccallum D 07/17/2018, 7:15 PM

## 2018-07-18 NOTE — Progress Notes (Signed)
Occupational Therapy Treatment Patient Details Name: Deborah Arroyo MRN: [id number] DOB: [birth year] Today's Date: 07/18/2018    History of present illness Pt is a 67 YO female s/p cystoscopy with bilateral retrograde pyelogram and bilateral open-ended urethral catheter placement; laparoscopic low anterior resection with coloproctostomy, diverting loop ileostomy, flexible sigmoidoscopy, bilateral TAP blocks on 07/12/18. Surgeries secondary to rectal cancer, diagnosed May 2019. Pt with PMH including chemo/radiation for cancer earlier in 2019, cataracts, cervical cancer 1979, depression, dyspnea, hyponatremia with syncope, osteopenia, smoker. Past surgical history includes abdominal hysterectomy, cholecystectomy, L RTC repair.   OT comments  Completed ADL and educated on AE options as well as using common object such as kitchen tongs to elongate reach for pants/underwear. Pt would benefit from toilet aide. Cued pt to back off during ADL as pain was registered on face with reaching and she is very independent.  Reinforced her lifting restrictions   Follow Up Recommendations  Supervision/Assistance - 24 hour;Home health OT    Equipment Recommendations  3 in 1 bedside commode    Recommendations for Other Services      Precautions / Restrictions Precautions Precautions: Fall Precaution Comments: abdominal - log rolling technique Restrictions Weight Bearing Restrictions: No       Mobility Bed Mobility     Rolling: Supervision Sidelying to sit: Supervision     Sit to sidelying: Min assist General bed mobility comments: light assistance for RLE for back to bed  Transfers                 General transfer comment: returned to supine for peri care    Balance                                           ADL either performed or assessed with clinical judgement   ADL               Lower Body Bathing: Minimal assistance;Moderate assistance       Lower  Body Dressing: Minimal assistance;Sit to/from stand                 General ADL Comments: completed ADL at eob and educated on using kitchen tongs for adls. She can almost reach to feet.  Encouraged using adapted method so that she doesn't overdo it.  Also educated on sock aide; she is fine with family helping with this only.  Pt has difficulty reaching peri area:  educated on toilet aide for both bathing and hygiene---left resource for her     Vision       Perception     Praxis      Cognition Arousal/Alertness: Awake/alert Behavior During Therapy: WFL for tasks assessed/performed Overall Cognitive Status: Within Functional Limits for tasks assessed                                 General Comments: pt is very independent minded and needs cues to not overdo it        Exercises     Shoulder Instructions       General Comments      Pertinent Vitals/ Pain       Faces Pain Scale: Hurts even more Pain Location: abdomen with activity Pain Descriptors / Indicators: Grimacing;Guarding;Sore Pain Intervention(s): Limited activity within patient's tolerance;Monitored during session;Repositioned;Patient requesting pain meds-RN notified;RN  gave pain meds during session  Home Living                                          Prior Functioning/Environment              Frequency  Min 2X/week        Progress Toward Goals  OT Goals(current goals can now be found in the care plan section)  Progress towards OT goals: Progressing toward goals     Plan      Co-evaluation                 AM-PAC PT "6 Clicks" Daily Activity     Outcome Measure   Help from another person eating meals?: None Help from another person taking care of personal grooming?: A Little Help from another person toileting, which includes using toliet, bedpan, or urinal?: A Little Help from another person bathing (including washing, rinsing, drying)?: A  Little Help from another person to put on and taking off regular upper body clothing?: A Little Help from another person to put on and taking off regular lower body clothing?: A Lot 6 Click Score: 18    End of Session    OT Visit Diagnosis: Unsteadiness on feet (R26.81);Muscle weakness (generalized) (M62.81);History of falling (Z91.81)   Activity Tolerance Patient tolerated treatment well   Patient Left in bed;with call bell/phone within reach;with bed alarm set;in chair   Nurse Communication          Time: 9244-6286 OT Time Calculation (min): 35 min  Charges: OT General Charges $OT Visit: 1 Visit OT Treatments $Self Care/Home Management : 23-37 mins  Lesle Chris, OTR/L Acute Rehabilitation Services [phone] [place] pager [phone] office 07/18/2018   Hayward Rylander 07/18/2018, 10:00 AM

## 2018-07-18 NOTE — Consult Note (Signed)
WOC follow-up: Pouch intact with good seal.  Plan to perform another pouch change and teaching session tomorrow.  Pt denies further questions at this time. Julien Girt MSN, RN, [facility], [place], [place]

## 2018-07-18 NOTE — Progress Notes (Signed)
Subjective Nausea/emesis x1 overnight. Tolerating bites of food - not eating much yet - at half of an omelet yesterday and then some bites with lunch and dinner. Getting up to bathroom and around room. Denies complaints this morning aside from depressed appetite  Objective: Vital signs in last 24 hours: Temp:  [98.5 F (36.9 C)-99.1 F (37.3 C)] 98.5 F (36.9 C) (09/17 0536) Pulse Rate:  [67-81] 67 (09/17 0536) Resp:  [16-18] 16 (09/17 0536) BP: (106-136)/(51-65) 106/51 (09/17 0536) SpO2:  [97 %-99 %] 98 % (09/17 0536) Weight:  [57 kg] 57 kg (09/17 0649) Last BM Date: 07/17/18(ileostomy)  Intake/Output from previous day: 09/16 0701 - 09/17 0700 In: 2010.1 [P.O.:300; I.V.:1642.9; IV Piggyback:67.3] Out: 4270 [Urine:1250; Drains:355; Stool:2150] Intake/Output this shift: No intake/output data recorded.  Gen: NAD, comfortable CV: RRR Pulm: Normal work of breathing Abd: Incisions c/d/i without erythema. Abd soft, NT; mildly distended; loop ileostomy pink with red rubber in place - thin bilious output in appliance. JP drain with clear serous output Ext: SCDs in place  Lab Results: CBC  Recent Labs    07/16/18 0404  WBC 12.7*  HGB 12.6  HCT 37.9  PLT 359   BMET Recent Labs    07/16/18 0404 07/17/18 0400  NA 138 135  K 3.8 3.2*  CL 105 100  CO2 27 25  GLUCOSE 95 94  BUN 9 11  CREATININE 0.66 0.68  CALCIUM 8.6* 8.2*   PT/INR No results for input(s): LABPROT, INR in the last 72 hours. ABG No results for input(s): PHART, HCO3 in the last 72 hours.  Invalid input(s): PCO2, PO2  UOP: 1.3L/24hrs JP: 355cc/24hrs, serous Ileostomy: 2.2L/24hrs  Studies/Results:  Anti-infectives: Anti-infectives (From admission, onward)   Start     Dose/Rate Route Frequency Ordered Stop   07/12/18 1400  neomycin (MYCIFRADIN) tablet 1,000 mg  Status:  Discontinued     1,000 mg Oral 3 times per day 07/12/18 0736 07/12/18 0739   07/12/18 1400  metroNIDAZOLE (FLAGYL) tablet 1,000 mg   Status:  Discontinued     1,000 mg Oral 3 times per day 07/12/18 0736 07/12/18 0739   07/12/18 0745  cefoTEtan (CEFOTAN) 2 g in sodium chloride 0.9 % 100 mL IVPB     2 g 200 mL/hr over 30 Minutes Intravenous On call to O.R. 07/12/18 0736 07/12/18 0855       Assessment/Plan: Patient Active Problem List   Diagnosis Date Noted  . Benign neoplasm of transverse colon   . Benign neoplasm of descending colon   . Great toe pain, right 04/02/2018  . Rectal cancer ([place]) 03/06/2018  . Osteopenia 12/15/2017  . Cervicogenic headache 09/08/2017  . Rectal mass 08/25/2017  . Bilateral knee pain 08/25/2017  . Health care maintenance 09/04/2015  . Tobacco abuse 09/04/2015   s/p Procedure(s): LAPAROSCOPIC LOW ANTERIOR RESECTION WITH COLOPROSTOSTOMY, ERAS PATHWAY DIVERTING LOOP ILEOSTOMY FLEXIBLE SIGMOIDOSCOPY CYSTOSCOPY WITH STENT PLACEMENT 07/12/2018  POD#6  -Continue soft diet  -Continue MIVF today given ostomy output - anticipate this will slow now that she's on a diet and BID metamucil to help thicken output as well. Holding on Imodium until nausea improves -WOCN following for new ileostomy education/care. Will need to empty/record 24hr outputs - goal <1.2L/24hrs. Will need to demonstrate ability to do this prior to discharge -PT/OT; recommending home PT/OT; also needs home health RN for new ileostomy. She lives with her 2 sons and brother whom are willing to help with the home transition -Ambulate 5x/day; IS 10x/hr while awake -PPx:  SQH, SCDs   LOS: 6 days   Sharon Mt. Dema Severin, M.D. General and Colorectal Surgery [facility]

## 2018-07-18 NOTE — Care Management Note (Signed)
Case Management Note  Patient Details  Name: NAKEYA ADINOLFI MRN: [id number] Date of Birth: Jun 12, 1951  Subjective/Objective:  Patient wants home w/HHC-[name] already chosen-rep [facility] Glad aware-HHRN/PT/OT-ordered, also rw(ordered, & delivere to rm already), will need 3n1. Patient from home w/sons. Has transp @ d/c-sister will pick her up. Noted on 02-will monitor if needed with qualifying 02 sats @ least 2 days prior d/c documented.                  Action/Plan:d/c home w/HHC/dme.   Expected Discharge Date:                  Expected Discharge Plan:  [place]  In-House Referral:     Discharge planning Services  CM Consult  Post Acute Care Choice:    Choice offered to:  Patient  DME Arranged:  Walker rolling with seat, 3-N-1 DME Agency:  [place]:  RN, PT, OT [facility] Agency:  [place]  Status of Service:  In process, will continue to follow  If discussed at Long Length of Stay Meetings, dates discussed:    Additional Comments:  Dessa Phi, RN 07/18/2018, 3:10 PM

## 2018-07-19 MED ORDER — CALCIUM POLYCARBOPHIL 625 MG PO TABS
625.0000 mg | ORAL_TABLET | Freq: Two times a day (BID) | ORAL | Status: DC
  Administered 2018-07-19 – 2018-07-20 (×4): 625 mg via ORAL
  Filled 2018-07-19 (×5): qty 1

## 2018-07-19 MED ORDER — LOPERAMIDE HCL 2 MG PO CAPS
2.0000 mg | ORAL_CAPSULE | Freq: Three times a day (TID) | ORAL | Status: DC
  Administered 2018-07-19 (×3): 2 mg via ORAL
  Filled 2018-07-19 (×3): qty 1

## 2018-07-19 NOTE — Consult Note (Addendum)
WOC follow-up: Ileostomy pouch is intact with good seal.  Pt declines offer to assist with change today.  She is able to verbalize the steps and denies need for another teaching session today. She states she has been emptying without assistance.  Reviewed pouching routines, ordering supplies, and dietary precautions. Supplies and educational materials at the bedside.  Julien Girt MSN, RN, [place], [place], [place]

## 2018-07-19 NOTE — Progress Notes (Signed)
Physical Therapy Treatment Patient Details Name: Deborah Arroyo MRN: [id number] DOB: 1951-01-18 Today's Date: 07/19/2018    History of Present Illness Pt is a 67 YO female s/p cystoscopy with bilateral retrograde pyelogram and bilateral open-ended urethral catheter placement; laparoscopic low anterior resection with coloproctostomy, diverting loop ileostomy, flexible sigmoidoscopy, bilateral TAP blocks on 07/12/18. Surgeries secondary to rectal cancer, diagnosed May 2019. Pt with PMH including chemo/radiation for cancer earlier in 2019, cataracts, cervical cancer 1979, depression, dyspnea, hyponatremia with syncope, osteopenia, smoker. Past surgical history includes abdominal hysterectomy, cholecystectomy, L RTC repair.    PT Comments    Pt with continued abdominal pain secondary to surgery. Pt also complaining of back pain today, which she describes as deep and states "it might be my kidney or something". Pt using hot pad in room for this. PT ambulated 350 ft with rollator today, requiring supervision for all mobility. Pt encouraged to take rest breaks during session as she needs them, but she is reluctant to do so. PT to continue to progress mobility as able. Will continue to follow acutely.    Follow Up Recommendations  Home health PT     Equipment Recommendations  Rolling walker with 5" wheels    Recommendations for Other Services       Precautions / Restrictions Precautions Precautions: Fall Precaution Comments: abdominal - log rolling technique Restrictions Weight Bearing Restrictions: No    Mobility  Bed Mobility Overal bed mobility: Needs Assistance Bed Mobility: Rolling;Sidelying to Sit;Sit to Sidelying Rolling: Supervision Sidelying to sit: Supervision     Sit to sidelying: Supervision General bed mobility comments: supervision for all bed mobility; increased time and effort to perform mostly limited by pain and some LE weakness  Transfers Overall transfer level:  Needs assistance Equipment used: 4-wheeled walker Transfers: Sit to/from Stand Sit to Stand: Supervision         General transfer comment: supervision for safety, increased time to perform due to abdominal pain. Pt holding abdomen and grimacing during transfer.   Ambulation/Gait Ambulation/Gait assistance: Min Gaffer (Feet): 350 Feet Assistive device: 4-wheeled walker Gait Pattern/deviations: Step-through pattern;Decreased stride length;Trunk flexed Gait velocity: decr   General Gait Details: Min guard at first due to new use of rollator. Pt educated on locking, sitting, unlocking, and pushing rollator prior to mobilization. Pt with one standing rest break and stated she felt winded at one point but did not want to stop. PT checked O2sats and pulse, O2sats at 98% on 2LO2 and pulse of 83 bpm.    Stairs             Wheelchair Mobility    Modified Rankin (Stroke Patients Only)       Balance Overall balance assessment: Mild deficits observed, not formally tested                                          Cognition Arousal/Alertness: Awake/alert Behavior During Therapy: WFL for tasks assessed/performed Overall Cognitive Status: Within Functional Limits for tasks assessed                                        Exercises General Exercises - Lower Extremity Ankle Circles/Pumps: AROM;Both;10 reps;Seated Short Arc Quad: AROM;Both;10 reps;Supine    General Comments        Pertinent  Vitals/Pain Pain Assessment: Faces Faces Pain Scale: Hurts little more Pain Location: abdomen with activity Pain Descriptors / Indicators: Grimacing;Guarding;Sore Pain Intervention(s): Limited activity within patient's tolerance;Repositioned;Monitored during session    Home Living                      Prior Function            PT Goals (current goals can now be found in the care plan section) Acute Rehab PT Goals PT  Goal Formulation: With patient Time For Goal Achievement: 07/27/18 Potential to Achieve Goals: Good Progress towards PT goals: Progressing toward goals    Frequency    Min 3X/week      PT Plan Current plan remains appropriate    Co-evaluation              AM-PAC PT "6 Clicks" Daily Activity  Outcome Measure  Difficulty turning over in bed (including adjusting bedclothes, sheets and blankets)?: A Little Difficulty moving from lying on back to sitting on the side of the bed? : A Little Difficulty sitting down on and standing up from a chair with arms (e.g., wheelchair, bedside commode, etc,.)?: A Little Help needed moving to and from a bed to chair (including a wheelchair)?: A Little Help needed walking in hospital room?: A Little Help needed climbing 3-5 steps with a railing? : A Lot 6 Click Score: 17    End of Session Equipment Utilized During Treatment: Oxygen;Gait belt(2L O2 via [place]) Activity Tolerance: Patient tolerated treatment well;Patient limited by pain Patient left: with call bell/phone within reach;in chair   PT Visit Diagnosis: Other abnormalities of gait and mobility (R26.89);Muscle weakness (generalized) (M62.81)     Time: 7353-2992 PT Time Calculation (min) (ACUTE ONLY): 32 min  Charges:  $Gait Training: 23-37 mins                     Julien Girt, PT Acute Rehabilitation Services Pager [phone]  Office [phone]   Lidia Clavijo D Elonda Husky 07/19/2018, 2:06 PM

## 2018-07-19 NOTE — Progress Notes (Signed)
Occupational Therapy Treatment Patient Details Name: Deborah Arroyo MRN: [id number] DOB: 05-09-1951 Today's Date: 07/19/2018    History of present illness Pt is a 67 YO female s/p cystoscopy with bilateral retrograde pyelogram and bilateral open-ended urethral catheter placement; laparoscopic low anterior resection with coloproctostomy, diverting loop ileostomy, flexible sigmoidoscopy, bilateral TAP blocks on 07/12/18. Surgeries secondary to rectal cancer, diagnosed May 2019. Pt with PMH including chemo/radiation for cancer earlier in 2019, cataracts, cervical cancer 1979, depression, dyspnea, hyponatremia with syncope, osteopenia, smoker. Past surgical history includes abdominal hysterectomy, cholecystectomy, L RTC repair.   OT comments  Pt with increased pain today.  Pt verbalizes understanding of AE. She was asking about building activity.  Reviewed energy conservation and gave her handouts. Pt tends to push through activities.  Demonstrated sidestepping over tub   Follow Up Recommendations  Supervision/Assistance - 24 hour;Home health OT    Equipment Recommendations  3 in 1 bedside commode    Recommendations for Other Services      Precautions / Restrictions Precautions Precautions: Fall Precaution Comments: abdominal - log rolling technique Restrictions Weight Bearing Restrictions: No          Balance Overall balance assessment: Mild deficits observed, not formally tested                                         ADL either performed or assessed with clinical judgement   ADL                                         General ADL Comments: educated on energy conservation and sidestepping into tub. Pt had already done limited bathing.  Did not want to use AE today--would like her to demonstrate use of this prior to d/c as she pushes herself through activities and is reluctant to having help     Vision       Perception     Praxis       Cognition Arousal/Alertness: Awake/alert Behavior During Therapy: WFL for tasks assessed/performed Overall Cognitive Status: Within Functional Limits for tasks assessed                                          Exercises    Shoulder Instructions       General Comments      Pertinent Vitals/ Pain       Pain Assessment: Faces Faces Pain Scale: Hurts even more Pain Location: abdomen with activity Pain Descriptors / Indicators: Grimacing;Guarding;Sore Pain Intervention(s): Limited activity within patient's tolerance;Monitored during session;Premedicated before session;Repositioned;Heat applied  Home Living                                          Prior Functioning/Environment              Frequency  Min 2X/week        Progress Toward Goals  OT Goals(current goals can now be found in the care plan section)  Progress towards OT goals: Progressing toward goals     Plan      Co-evaluation  AM-PAC PT "6 Clicks" Daily Activity     Outcome Measure   Help from another person eating meals?: None Help from another person taking care of personal grooming?: A Little Help from another person toileting, which includes using toliet, bedpan, or urinal?: A Little Help from another person bathing (including washing, rinsing, drying)?: A Little Help from another person to put on and taking off regular upper body clothing?: A Little Help from another person to put on and taking off regular lower body clothing?: A Lot 6 Click Score: 18    End of Session    OT Visit Diagnosis: Unsteadiness on feet (R26.81);Muscle weakness (generalized) (M62.81);History of falling (Z91.81)   Activity Tolerance Patient limited by pain   Patient Left in chair;with call bell/phone within reach   Nurse Communication          Time: 2956-2130 OT Time Calculation (min): 21 min  Charges: OT General Charges $OT Visit: 1 Visit OT  Treatments $Self Care/Home Management : 8-22 mins  Deborah Arroyo, OTR/L Acute Rehabilitation Services [phone] [place] pager [phone] office 07/19/2018   Deborah Arroyo 07/19/2018, 3:45 PM

## 2018-07-19 NOTE — Progress Notes (Signed)
Subjective Some nausea but no emesis. Tolerating diet. Stoma teaching going well - underway. Getting up to bathroom and around room. Denies complaints this morning  Objective: Vital signs in last 24 hours: Temp:  [97.9 F (36.6 C)-98.5 F (36.9 C)] 98.3 F (36.8 C) (09/18 0519) Pulse Rate:  [69-76] 73 (09/18 0519) Resp:  [13-18] 14 (09/18 0519) BP: (104-124)/(64-81) 107/64 (09/18 0519) SpO2:  [88 %-99 %] 96 % (09/18 0519) Weight:  [57.2 kg] 57.2 kg (09/18 0453) Last BM Date: 07/17/18(ileostomy)  Intake/Output from previous day: 09/17 0701 - 09/18 0700 In: 2452.1 [P.O.:600; I.V.:1852.1] Out: 3180 [Urine:1600; Drains:330; ZOXWR:6045] Intake/Output this shift: No intake/output data recorded.  Gen: NAD, comfortable CV: RRR Pulm: Normal work of breathing Abd: Incisions c/d/i without erythema. Abd soft, NT; mildly distended; loop ileostomy pink with red rubber in place - thin bilious output in appliance. JP drain with clear serous output Ext: SCDs in place  Lab Results: CBC  No results for input(s): WBC, HGB, HCT, PLT in the last 72 hours. BMET Recent Labs    07/17/18 0400  NA 135  K 3.2*  CL 100  CO2 25  GLUCOSE 94  BUN 11  CREATININE 0.68  CALCIUM 8.2*   PT/INR No results for input(s): LABPROT, INR in the last 72 hours. ABG No results for input(s): PHART, HCO3 in the last 72 hours.  Invalid input(s): PCO2, PO2  UOP: 1.6L/24hrs JP: 330cc/24hrs, serous Ileostomy: 1.25L/24hrs  Studies/Results:  Anti-infectives: Anti-infectives (From admission, onward)   Start     Dose/Rate Route Frequency Ordered Stop   07/12/18 1400  neomycin (MYCIFRADIN) tablet 1,000 mg  Status:  Discontinued     1,000 mg Oral 3 times per day 07/12/18 0736 07/12/18 0739   07/12/18 1400  metroNIDAZOLE (FLAGYL) tablet 1,000 mg  Status:  Discontinued     1,000 mg Oral 3 times per day 07/12/18 0736 07/12/18 0739   07/12/18 0745  cefoTEtan (CEFOTAN) 2 g in sodium chloride 0.9 % 100 mL IVPB     2 g 200 mL/hr over 30 Minutes Intravenous On call to O.R. 07/12/18 0736 07/12/18 0855       Assessment/Plan: Patient Active Problem List   Diagnosis Date Noted  . Benign neoplasm of transverse colon   . Benign neoplasm of descending colon   . Great toe pain, right 04/02/2018  . Rectal cancer ([place]) 03/06/2018  . Osteopenia 12/15/2017  . Cervicogenic headache 09/08/2017  . Rectal mass 08/25/2017  . Bilateral knee pain 08/25/2017  . Health care maintenance 09/04/2015  . Tobacco abuse 09/04/2015   s/p Procedure(s): LAPAROSCOPIC LOW ANTERIOR RESECTION WITH COLOPROSTOSTOMY, ERAS PATHWAY DIVERTING LOOP ILEOSTOMY FLEXIBLE SIGMOIDOSCOPY CYSTOSCOPY WITH STENT PLACEMENT 07/12/2018  POD#7  -Continue soft diet - encouraged her to eat more today -Continue MIVF today given ostomy output - slowing down - didn't like metamucil and didn't take all of it - switch to fibercon pills BID. Her output is now decreasing; 1.25L/24hrs; will start low dose imodium as she is at risk for issues arising from increased output at home given social situation -WOCN following for new ileostomy education/care. Will need to empty/record 24hr outputs - goal <1.2L/24hrs. Will need to demonstrate ability to do this prior to discharge -PT/OT; recommending home PT/OT; also needs home health RN for new ileostomy. She lives with her 2 sons and brother whom are willing to help with the home transition -Ambulate 5x/day; IS 10x/hr while awake -PPx: SQH, SCDs -Dispo: Possible discharge tomorrow if output controlled, tolerating diet, pain controlled,  comfortable with appliance and home health RN, PT & OT in place.   LOS: 7 days   Sharon Mt. Dema Severin, M.D. General and Colorectal Surgery [facility]

## 2018-07-20 MED ORDER — LOPERAMIDE HCL 2 MG PO TABS: 2.0000 mg | ORAL_TABLET | Freq: Three times a day (TID) | ORAL | 2 refills | Status: DC

## 2018-07-20 MED ORDER — LOPERAMIDE HCL 2 MG PO CAPS
2.0000 mg | ORAL_CAPSULE | Freq: Three times a day (TID) | ORAL | Status: DC
  Administered 2018-07-20 (×4): 2 mg via ORAL
  Filled 2018-07-20 (×4): qty 1

## 2018-07-20 MED ORDER — BENEFIBER PO TABS: 1.0000 | ORAL_TABLET | Freq: Two times a day (BID) | ORAL | 2 refills | Status: DC

## 2018-07-20 MED ORDER — TRAMADOL HCL 50 MG PO TABS: 50.0000 mg | ORAL_TABLET | Freq: Four times a day (QID) | ORAL | 0 refills | Status: AC | PRN

## 2018-07-20 NOTE — Consult Note (Addendum)
[place] Nurse ostomy follow up Ileostomy surgery performed on 9/11. Pt states she has been emptying her pouch without assistance. Stoma type/location: Stoma is red and viable, slightly above skin level, 1 1/4 inches.   Peristomal assessment: Intact skin surrounding stoma Output: 75cc green liquid stool  Ostomy pouching: 2pc.  Education provided:  Pt performed pouch application with minimal assistance, using barrier ring and 2 piece pouching system. 5 sets of supplies and educational materials left at the bedside.  She could benefit from home health assistance after discharge.  Reviewed pouching routines, ordering supplies, and dietary precautions.  Pt asked appropriate questions. Enrolled patient in [place] Start Discharge program: Yes, last week.   [place] team will continue to follow for further teaching session while in the hospital. Julien Girt MSN, RN, [place], [place], [place]

## 2018-07-20 NOTE — Progress Notes (Signed)
Occupational Therapy Treatment Patient Details Name: Deborah Arroyo MRN: [id number] DOB: 03/27/1951 Today's Date: 07/20/2018    History of present illness Pt is a 67 YO female s/p cystoscopy with bilateral retrograde pyelogram and bilateral open-ended urethral catheter placement; laparoscopic low anterior resection with coloproctostomy, diverting loop ileostomy, flexible sigmoidoscopy, bilateral TAP blocks on 07/12/18. Surgeries secondary to rectal cancer, diagnosed May 2019. Pt with PMH including chemo/radiation for cancer earlier in 2019, cataracts, cervical cancer 1979, depression, dyspnea, hyponatremia with syncope, osteopenia, smoker. Past surgical history includes abdominal hysterectomy, cholecystectomy, L RTC repair.   OT comments  Ambulated to bathroom, emptied colostomy and JP herself. Completed adl but had increased pain and lightheadedness which limited session.  Continued energy conservation education:  Pt does not like to sit for adls.  Did not use AE due to having to cut session short  Follow Up Recommendations  Supervision/Assistance - 24 hour;Home health OT    Equipment Recommendations  3 in 1 bedside commode    Recommendations for Other Services      Precautions / Restrictions Precautions Precautions: Fall Precaution Comments: abdominal - log rolling technique Restrictions Weight Bearing Restrictions: No       Mobility Bed Mobility     Rolling: Supervision Sidelying to sit: Min assist       General bed mobility comments: assist for RLE due to pain. Pt using bedrails with HOB raised 20  Transfers   Equipment used: 4-wheeled walker   Sit to Stand: Supervision         General transfer comment: pt independent locking wheels. Min guard when walking to maneuver around obstacles    Balance                                           ADL either performed or assessed with clinical judgement   ADL       Grooming: Wash/dry hands;Wash/dry  face;Oral care;Standing;Supervision/safety       Lower Body Bathing: Minimal assistance           Toilet Transfer: Min guard;Ambulation;BSC;RW             General ADL Comments: ambulated to bathroom to perform ADL; pt had increased abdominal pain and felt lightheaded. Used 4WW to roll back to side of bed.  BP 112/75 in bed. Pt felt better with repositioning.  Did not get to use AE this session due to pain/lightheadedness     Vision       Perception     Praxis      Cognition Arousal/Alertness: Awake/alert Behavior During Therapy: WFL for tasks assessed/performed Overall Cognitive Status: Within Functional Limits for tasks assessed                                          Exercises     Shoulder Instructions       General Comments      Pertinent Vitals/ Pain          Home Living                                          Prior Functioning/Environment  Frequency  Min 2X/week        Progress Toward Goals  OT Goals(current goals can now be found in the care plan section)  Progress towards OT goals: (limited today)     Plan      Co-evaluation                 AM-PAC PT "6 Clicks" Daily Activity     Outcome Measure   Help from another person eating meals?: None Help from another person taking care of personal grooming?: A Little Help from another person toileting, which includes using toliet, bedpan, or urinal?: A Little Help from another person bathing (including washing, rinsing, drying)?: A Little Help from another person to put on and taking off regular upper body clothing?: A Little Help from another person to put on and taking off regular lower body clothing?: A Lot 6 Click Score: 18    End of Session    OT Visit Diagnosis: Unsteadiness on feet (R26.81);Muscle weakness (generalized) (M62.81);History of falling (Z91.81)   Activity Tolerance Other (comment)(pain/lightheaded)    Patient Left in bed;with call bell/phone within reach   Nurse Communication (pain med and lightheaded/BP WFLs)        Time: 9485-4627 OT Time Calculation (min): 40 min  Charges: OT General Charges $OT Visit: 1 Visit OT Treatments $Self Care/Home Management : 38-52 mins  Lesle Chris, OTR/L Acute Rehabilitation Services [phone] [place] pager [phone] office 07/20/2018   Lakelynn Severtson 07/20/2018, 11:25 AM

## 2018-07-20 NOTE — Progress Notes (Signed)
I have reviewed and concur with this student's documentation.   

## 2018-07-20 NOTE — Progress Notes (Signed)
Physical Therapy Treatment Patient Details Name: Deborah Arroyo MRN: [id number] DOB: [birth year] Today's Date: 07/20/2018    History of Present Illness Pt is a 67 YO female s/p cystoscopy with bilateral retrograde pyelogram and bilateral open-ended urethral catheter placement; laparoscopic low anterior resection with coloproctostomy, diverting loop ileostomy, flexible sigmoidoscopy, bilateral TAP blocks on 07/12/18. Surgeries secondary to rectal cancer, diagnosed May 2019. Pt with PMH including chemo/radiation for cancer earlier in 2019, cataracts, cervical cancer 1979, depression, dyspnea, hyponatremia with syncope, osteopenia, smoker. Past surgical history includes abdominal hysterectomy, cholecystectomy, L RTC repair.    PT Comments    Pt ambulated around unit with rollator and encouraged to ambulate at least once more today.  Pt reports being up in room modified independent.  Pt hopeful for d/c home tomorrow.  Follow Up Recommendations  Home health PT     Equipment Recommendations  Other (comment)(has rollator in room now)    Recommendations for Other Services       Precautions / Restrictions Precautions Precautions: Fall Precaution Comments: abdominal - log rolling technique Restrictions Weight Bearing Restrictions: No    Mobility  Bed Mobility Overal bed mobility: Modified Independent Bed Mobility: Rolling;Sidelying to Sit;Sit to Sidelying Rolling: Supervision Sidelying to sit: Min assist       General bed mobility comments: pt performed log roll technique without cues  Transfers Overall transfer level: Needs assistance Equipment used: 4-wheeled walker Transfers: Sit to/from Stand Sit to Stand: Supervision         General transfer comment: pt independent locking wheels  Ambulation/Gait Ambulation/Gait assistance: Supervision Gait Distance (Feet): 400 Feet Assistive device: 4-wheeled walker Gait Pattern/deviations: Step-through pattern;Decreased stride  length;Trunk flexed     General Gait Details: verbal cues for rollator distance and posture, remained on 2L O2 [place], no SOB reported   Stairs             Wheelchair Mobility    Modified Rankin (Stroke Patients Only)       Balance                                            Cognition Arousal/Alertness: Awake/alert Behavior During Therapy: WFL for tasks assessed/performed Overall Cognitive Status: Within Functional Limits for tasks assessed                                        Exercises      General Comments        Pertinent Vitals/Pain Pain Assessment: 0-10 Pain Score: 5  Pain Location: abdomen with activity Pain Descriptors / Indicators: Grimacing;Guarding;Sore Pain Intervention(s): Monitored during session;Limited activity within patient's tolerance;Repositioned    Home Living                      Prior Function            PT Goals (current goals can now be found in the care plan section) Progress towards PT goals: Progressing toward goals    Frequency    Min 3X/week      PT Plan Current plan remains appropriate    Co-evaluation              AM-PAC PT "6 Clicks" Daily Activity  Outcome Measure  Difficulty turning over in bed (including adjusting bedclothes, sheets and  blankets)?: A Little Difficulty moving from lying on back to sitting on the side of the bed? : A Little Difficulty sitting down on and standing up from a chair with arms (e.g., wheelchair, bedside commode, etc,.)?: A Little Help needed moving to and from a bed to chair (including a wheelchair)?: A Little Help needed walking in hospital room?: A Little Help needed climbing 3-5 steps with a railing? : A Little 6 Click Score: 18    End of Session Equipment Utilized During Treatment: Oxygen Activity Tolerance: Patient tolerated treatment well Patient left: with call bell/phone within reach;in bed   PT Visit Diagnosis: Other  abnormalities of gait and mobility (R26.89);Muscle weakness (generalized) (M62.81)     Time: 0165-5374 PT Time Calculation (min) (ACUTE ONLY): 20 min  Charges:  $Gait Training: 8-22 mins                     Carmelia Bake, PT, DPT Acute Rehabilitation Services Office: [phone] Pager: [phone]  Trena Platt 07/20/2018, 2:42 PM

## 2018-07-20 NOTE — Progress Notes (Addendum)
Subjective No more nausea - related this to ensure. Tolerating diet. Stoma teaching going well - underway. Getting up to bathroom and around room. Denies complaints this morning  Objective: Vital signs in last 24 hours: Temp:  [98.1 F (36.7 C)-98.3 F (36.8 C)] 98.1 F (36.7 C) (09/19 0615) Pulse Rate:  [63-75] 75 (09/19 0615) Resp:  [15-18] 15 (09/19 0615) BP: (103-117)/(63-66) 103/66 (09/19 0615) SpO2:  [95 %-100 %] 100 % (09/19 0615) Weight:  [51.8 kg] 51.8 kg (09/19 0648) Last BM Date: 07/19/18  Intake/Output from previous day: 09/18 0701 - 09/19 0700 In: 2408.3 [P.O.:630; I.V.:1778.3] Out: 8366 [Urine:1525; Drains:475; QHUTM:5465] Intake/Output this shift: No intake/output data recorded.  Gen: NAD, comfortable CV: RRR Pulm: Normal work of breathing Abd: Incisions c/d/i without erythema. Abd soft, NT/ND; loop ileostomy pink - JP drain with clear serous output Ext: SCDs in place  Lab Results: CBC  No results for input(s): WBC, HGB, HCT, PLT in the last 72 hours. BMET No results for input(s): NA, K, CL, CO2, GLUCOSE, BUN, CREATININE, CALCIUM in the last 72 hours. PT/INR No results for input(s): LABPROT, INR in the last 72 hours. ABG No results for input(s): PHART, HCO3 in the last 72 hours.  Invalid input(s): PCO2, PO2  UOP: 1.6L/24hrs JP: 330cc/24hrs, serous Ileostomy: 1.25L/24hrs  Studies/Results:  Anti-infectives: Anti-infectives (From admission, onward)   Start     Dose/Rate Route Frequency Ordered Stop   07/12/18 1400  neomycin (MYCIFRADIN) tablet 1,000 mg  Status:  Discontinued     1,000 mg Oral 3 times per day 07/12/18 0736 07/12/18 0739   07/12/18 1400  metroNIDAZOLE (FLAGYL) tablet 1,000 mg  Status:  Discontinued     1,000 mg Oral 3 times per day 07/12/18 0736 07/12/18 0739   07/12/18 0745  cefoTEtan (CEFOTAN) 2 g in sodium chloride 0.9 % 100 mL IVPB     2 g 200 mL/hr over 30 Minutes Intravenous On call to O.R. 07/12/18 0736 07/12/18 0855        Assessment/Plan: Patient Active Problem List   Diagnosis Date Noted  . Benign neoplasm of transverse colon   . Benign neoplasm of descending colon   . Great toe pain, right 04/02/2018  . Rectal cancer ([place]) 03/06/2018  . Osteopenia 12/15/2017  . Cervicogenic headache 09/08/2017  . Rectal mass 08/25/2017  . Bilateral knee pain 08/25/2017  . Health care maintenance 09/04/2015  . Tobacco abuse 09/04/2015   s/p Procedure(s): LAPAROSCOPIC LOW ANTERIOR RESECTION WITH COLOPROSTOSTOMY, ERAS PATHWAY DIVERTING LOOP ILEOSTOMY FLEXIBLE SIGMOIDOSCOPY CYSTOSCOPY WITH STENT PLACEMENT 07/12/2018  POD#8  -Continue soft diet -Continue MIVF until discharge - 1.25L/24hrs; increased imodium to 2mg  30 minutes before each meal and additional tablet at bedtime -WOCN following for new ileostomy education/care. Will need to empty/record 24hr outputs - goal <1.2L/24hrs (36 oz) -PT/OT; recommending home PT/OT; also needs home health RN for new ileostomy. She lives with her 2 sons and brother whom are willing to help with the home transition -Ambulate 5x/day; IS 10x/hr while awake -May need home O2 - this is pending as well. No chest pain/SOB. No tachycardia. She has smoked for years and likely has underlying COPD. CXR did not demonstrate infiltrate -PPx: SQH, SCDs -Dispo: Anticipate discharge once ready and home health RN, PT & OT in place, home O2   LOS: 8 days   Sharon Mt. Dema Severin, M.D. General and Colorectal Surgery [facility]

## 2018-07-21 DIAGNOSIS — R198 Other specified symptoms and signs involving the digestive system and abdomen: Secondary | ICD-10-CM

## 2018-07-21 DIAGNOSIS — Z932 Ileostomy status: Secondary | ICD-10-CM

## 2018-07-21 DIAGNOSIS — E876 Hypokalemia: Secondary | ICD-10-CM

## 2018-07-21 DIAGNOSIS — Z9981 Dependence on supplemental oxygen: Secondary | ICD-10-CM

## 2018-07-21 HISTORY — DX: Ileostomy status: Z93.2

## 2018-07-21 HISTORY — DX: Other specified symptoms and signs involving the digestive system and abdomen: R19.8

## 2018-07-21 LAB — BASIC METABOLIC PANEL
Anion gap: 8 (ref 5–15)
BUN: 5 mg/dL — ABNORMAL LOW (ref 8–23)
CALCIUM: 8.8 mg/dL — AB (ref 8.9–10.3)
CO2: 30 mmol/L (ref 22–32)
CREATININE: 0.51 mg/dL (ref 0.44–1.00)
Chloride: 102 mmol/L (ref 98–111)
GFR calc Af Amer: >60 mL/min (ref >60)
GFR calc non Af Amer: >60 mL/min (ref >60)
GLUCOSE: 142 mg/dL — AB (ref 70–99)
Potassium: 3.1 mmol/L — ABNORMAL LOW (ref 3.5–5.1)
Sodium: 140 mmol/L (ref 135–145)

## 2018-07-21 LAB — CBC
HCT: 31.2 % — ABNORMAL LOW (ref 36.0–46.0)
Hemoglobin: 10.5 g/dL — ABNORMAL LOW (ref 12.0–15.0)
MCH: 32.7 pg (ref 26.0–34.0)
MCHC: 33.7 g/dL (ref 30.0–36.0)
MCV: 97.2 fL (ref 78.0–100.0)
PLATELETS: 472 10*3/uL — AB (ref 150–400)
RBC: 3.21 MIL/uL — ABNORMAL LOW (ref 3.87–5.11)
RDW: 15.2 % (ref 11.5–15.5)
WBC: 8 10*3/uL (ref 4.0–10.5)

## 2018-07-21 LAB — MAGNESIUM: Magnesium: 1.3 mg/dL — ABNORMAL LOW (ref 1.7–2.4)

## 2018-07-21 MED ORDER — LOPERAMIDE HCL 2 MG PO CAPS
2.0000 mg | ORAL_CAPSULE | Freq: Every day | ORAL | Status: DC
  Administered 2018-07-21: 2 mg via ORAL
  Filled 2018-07-21: qty 1

## 2018-07-21 MED ORDER — MAGNESIUM SULFATE 4 GM/100ML IV SOLN
4.0000 g | Freq: Once | INTRAVENOUS | Status: AC
  Administered 2018-07-21: 4 g via INTRAVENOUS
  Filled 2018-07-21: qty 100

## 2018-07-21 MED ORDER — ACETAMINOPHEN 500 MG PO TABS
1000.0000 mg | ORAL_TABLET | Freq: Three times a day (TID) | ORAL | Status: DC
  Administered 2018-07-21 – 2018-07-22 (×3): 1000 mg via ORAL
  Filled 2018-07-21 (×3): qty 2

## 2018-07-21 MED ORDER — CALCIUM POLYCARBOPHIL 625 MG PO TABS
1250.0000 mg | ORAL_TABLET | Freq: Two times a day (BID) | ORAL | Status: DC
  Administered 2018-07-21 – 2018-07-22 (×3): 1250 mg via ORAL
  Filled 2018-07-21 (×3): qty 2

## 2018-07-21 MED ORDER — POTASSIUM CHLORIDE 10 MEQ/100ML IV SOLN
10.0000 meq | INTRAVENOUS | Status: DC
  Filled 2018-07-21: qty 100

## 2018-07-21 MED ORDER — LOPERAMIDE HCL 2 MG PO CAPS
4.0000 mg | ORAL_CAPSULE | Freq: Every day | ORAL | Status: DC
  Administered 2018-07-21 – 2018-07-22 (×2): 4 mg via ORAL
  Filled 2018-07-21 (×2): qty 2

## 2018-07-21 MED ORDER — POTASSIUM CHLORIDE CRYS ER 20 MEQ PO TBCR
40.0000 meq | EXTENDED_RELEASE_TABLET | Freq: Two times a day (BID) | ORAL | Status: AC
  Administered 2018-07-21 (×2): 40 meq via ORAL
  Filled 2018-07-21 (×2): qty 2

## 2018-07-21 MED ORDER — SODIUM CHLORIDE 0.9 % IV SOLN: 250.0000 mL | INTRAVENOUS | Status: DC | PRN

## 2018-07-21 MED ORDER — LOPERAMIDE HCL 2 MG PO CAPS
4.0000 mg | ORAL_CAPSULE | Freq: Every day | ORAL | Status: DC
  Administered 2018-07-21: 4 mg via ORAL
  Filled 2018-07-21: qty 2

## 2018-07-21 MED ORDER — LACTATED RINGERS IV BOLUS: 1000.0000 mL | Freq: Three times a day (TID) | INTRAVENOUS | Status: DC | PRN

## 2018-07-21 MED ORDER — SODIUM CHLORIDE 0.9% FLUSH
3.0000 mL | Freq: Two times a day (BID) | INTRAVENOUS | Status: DC
  Administered 2018-07-22: 3 mL via INTRAVENOUS

## 2018-07-21 MED ORDER — FERROUS SULFATE 325 (65 FE) MG PO TABS
325.0000 mg | ORAL_TABLET | Freq: Two times a day (BID) | ORAL | Status: DC
  Administered 2018-07-21 – 2018-07-22 (×3): 325 mg via ORAL
  Filled 2018-07-21 (×3): qty 1

## 2018-07-21 MED ORDER — SODIUM CHLORIDE 0.9% FLUSH: 3.0000 mL | INTRAVENOUS | Status: DC | PRN

## 2018-07-21 NOTE — Progress Notes (Addendum)
Deborah Arroyo [id number] 1951-07-05  CARE TEAM:  PCP: Deborah Craze, DO  Outpatient Care Team: Patient Care Team: Deborah Craze, DO as PCP - General  Inpatient Treatment Team: Treatment Team: Attending Provider: Ileana Roup, MD; [place] Nurse: Deborah Regulus, RN; Technician: Deborah Arroyo, NT; Technician: Deborah Arroyo, NT; [place] Nurse: Deborah Mode, RN; Technician: Deborah Arroyo, Deborah Arroyo, NT; Technician: Deborah Arroyo, NT; Technician: Deborah Arroyo, Deborah Arroyo; Registered Nurse: Deborah Mouse, RN; Consulting Physician: Deborah Boston, MD; Occupational Therapist: Lesle Arroyo, OT; Case Manager: Deborah Maple, RN   Problem List:   Principal Problem:   Rectal cancer s/p LAR/diverting loop ileostomy 07/12/2018 Active Problems:   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  BAck up IVF boluses  Check labs.  Increase Imodium at breakfast & bedtime.  Increase fiber.  Add iron.  Try and slow ostomy output to ideally less than a liter a day.  1900 a day is too much.  Hopefully discharge this weekend if ileostomy diarrhea better controlled and oral intake improved.  See if labs OK/stable tomorrow.  She seemed annoyed/disappointed she was not going home today.  I explained my reasoning in the interest of trying to be safe.  Continue ileostomy care.  Still seems to be needing oxygen.  Working on home health for that.  Try to wean.  Rectal cancer - to discuss at GI Tumor Board ? Incomplete colonoscopy secondary to obstructing tumorRectal cancer, obstructing rectal mass on colonoscopy 03/01/2018-biopsy confirmed adenocarcinoma ? Staging CTs  on 03/03/2018-small mediastinal/hilar lymph nodes, indeterminate lingula nodule, rectal mass, small perirectal lymph nodes, mass measured at approximately 4.9 cm from the anal verge ? MRI pelvis 03/11/2018- T3c,N2 Radiation/Xeloda  04/03/18 - 05/11/18 Lap LAR/dierting loop ileostomy 07/12/2018  Pathologic Stage Classification (pTNM, AJCC 8th Edition): ypT3, ypN0 (0/11 LN)   VTE prophylaxis- SCDs, etc  Mobilize as tolerated to help recovery  30 minutes spent in review, evaluation, examination, counseling, and coordination of care.  More than 50% of that time was spent in counseling.  07/21/2018    Subjective: (Chief complaint)  Fair amount of output from ileostomy - 1.9L  No more nausea.  Appetite better.  Still needing oxygen.  Trying to walk in hallways.  Denies much pain.  Objective:  Vital signs:  Vitals:   07/20/18 1320 07/20/18 2055 07/21/18 0517 07/21/18 0638  BP: 98/61 90/64 111/65   Pulse: 66 69 67   Resp: _0 Temp: 98.6 F (37 C) 98.7 F (37.1 C) 98.5 F (36.9 C)   TempSrc: Oral Oral Oral   SpO2: 94% 91% 98%   Weight:    51.6 kg  Height:        Last BM Date: 07/20/18  Intake/Output   Yesterday:  09/19 0701 - 09/20 0700 In: 2621.5 [P.O.:878; I.V.:1743.5] Out: 3559 [Urine:2000; Drains:60; RCBUL:8453] This shift:  No intake/output data recorded.  Bowel function:  Flatus: YES  BM:  YES  Drain: (No drain)   Physical Exam:  General: Pt awake/alert/oriented x4 in no acute distress Eyes: PERRL, normal EOM.  Sclera clear.  No icterus Neuro: CN II-XII intact w/o focal sensory/motor deficits. Lymph: No  head/neck/groin lymphadenopathy Psych:  No delerium/psychosis/paranoia HENT: Normocephalic, Mucus membranes moist.  No thrush.  On oxygen Neck: Supple, No tracheal deviation Chest: No chest wall pain w good excursion CV:  Pulses intact.  Regular rhythm MS: Normal AROM mjr joints.  No obvious deformity  Abdomen: Soft.  Nondistended.  Mildly  tender at incisions only.  No evidence of peritonitis.  No incarcerated hernias.  Ext:  No deformity.  No mjr edema.  No cyanosis Skin: No petechiae / purpura  Results:   1. Colon, segmental resection for tumor, rectum and sigmoid - INVASIVE COLORECTAL ADENOCARCINOMA, 2.5 CM. - TUMOR EXTENDS INTO PERIRECTAL CONNECTIVE TISSUE. - MARGINS NOT INVOLVED. - ELEVEN BENIGN LYMPH NODES (0/11). - SEPARATE TUBULAR ADENOMA, 0.5 CM. 2. Colon, resection margin (donut), final distal - BENIGN COLON. - NO EVIDENCE OF MALIGNANCY. - FINAL DISTAL MARGIN CLEAR. 3. Colon, resection margin (donut), proximal - BENIGN COLON. - NO EVIDENCE OF MALIGNANCY. - FINAL PROXIMAL MARGIN CLEAR. Microscopic Comment 1. COLON AND RECTUM: Resection, Including Transanal Disk Excision of Rectal Neoplasms Procedure: Resection. Tumor Site: Rectum. Tumor Size: 2.5 cm. Macroscopic Tumor Perforation: No. Histologic Type: Colorectal adenocarcinoma. Histologic Grade: Moderately differentiated. Tumor Extension: Into perirectal connective tissue. Margins: Free of tumor. Treatment Effect: Present. Lymphovascular Invasion: No. Perineural Invasion: No. Tumor Deposits: No. Regional Lymph Nodes: Number of Lymph Nodes Involved: 0 1 of 4 Supplemental copy SUPPLEMENTAL for Deborah, Arroyo [phone]) Microscopic Comment(continued) Number of Lymph Nodes Examined: 11. Pathologic Stage Classification (pTNM, AJCC 8th Edition): pT3, pN0. Ancillary Studies: MMR / MSI pending. Representative tumor block: 1C, 1D, 1E, and 55F. Comments: The perirectal adipose tissue will be cleared and one additional lymph node is isolated. (Deborah Arroyo, 07/14/18) (v4.0.1.0) Deborah Laws MD Pathologist, Electronic Signature (Case signed 07/17/2018) Specimen Deborah Arroyo and Clinical Information Specimen(s) Obtained: 1. Colon, segmental resection for tumor, rectum and sigmoid 2. Colon, resection margin (donut), final distal 3. Colon, resection margin (donut),  proximal Specimen Clinical Information 1. rectal cancer [rd] Deborah Arroyo 1. Specimen: Rectosigmoid, to include at least middle third of rectum Specimen integrity: Partially opened Specimen length: 27 cm total length (10 cm of rectum). Mesorectal intactness: Near complete Tumor location: Middle third of rectum, posterior wall Tumor size: 2.5 cm in length and 1.1 cm area of healing ulceration, with underlying indurated wall thickened to 1 cm. Distinct mass is not identified. There is tattooing in the area of the healing ulceration. Percent of bowel circumference involved: 40% Tumor distance to margins: Proximal: 22.5 cm Distal: 2 cm Mesenteric (sigmoid and transverse): 5 cm Radial (posterior ascending, posterior descending; lateral and posterior mid-rectum; and entire lower 1/3 rectum): The indurated tissue in the wall is 2.6 cm from the perirectal soft tissue margin. Macroscopic extent of tumor invasion: The area of indurated tissue in the wall appears to involve underlying fat, however definite mass for Kristalynn Coddington evaluation of invasion is indeterminate. Total presumed lymph nodes: On initial search, five possible lymph nodes ranging from 0.2 to 0.5 cm are submitted. Following fixation in clearing solution, one additional possible node is submitted. Extramural satellite tumor nodules: None Mucosal polyp(s): Adjacent to the healing ulceration is a 0.5 cm tan red polyp. Additional findings: None Block summary: A = proximal margin B = distal margin C-G = ulceration, entirely submitted H = polyp I = five possible nodes 2 of 4 Supplemental copy SUPPLEMENTAL for KIESHA, ENSEY [phone]) Aaira Oestreicher(continued) J = five possible nodes K= one possible node found after fat clearing Total = Eleven blocks. 2. Received in formalin is a  1.8 cm i diameter and up to 0.7 cm in length ring of tan pink to hyperemic smooth, soft mucosa with underlying embedded staples. Representative sections are submitted in  one block. 3. Received in formalin around a stapling device is a 1.7 cm in diameter and up to 0.7cm in length ring of tan pink to hyperemic smooth, soft mucosa with embedded blue suture-like material. Representative sections are submitted in one block. (SSW:kh 07-13-18) Disclaimer Some of these immunohistochemical stains may have been developed and the performance characteristics determined by [facility]. Some may not have been cleared or approved by the U.S. Food and Drug Administration. The FDA has determined that such clearance or approval is not necessary. This test is used for clinical purposes. It should not be regarded as investigational or for research. This laboratory is certified under the [place] (CLIA-88) as qualified to perform high complexity clinical laboratory testing. Report signed out from the following location(s) Technical component and interpretation was performed at [facility] [place], [place], [place] [address]. CLIA #: [id number], 3 of  Labs: No results found for this or any previous visit (from the past 48 hour(s)).  Imaging / Studies: No results found.  Medications / Allergies: per chart  Antibiotics: Anti-infectives (From admission, onward)   Start     Dose/Rate Route Frequency Ordered Stop   07/12/18 1400  neomycin (MYCIFRADIN) tablet 1,000 mg  Status:  Discontinued     1,000 mg Oral 3 times per day 07/12/18 0736 07/12/18 0739   07/12/18 1400  metroNIDAZOLE (FLAGYL) tablet 1,000 mg  Status:  Discontinued     1,000 mg Oral 3 times per day 07/12/18 0736 07/12/18 0739   07/12/18 0745  cefoTEtan (CEFOTAN) 2 g in sodium chloride 0.9 % 100 mL IVPB     2 g 200 mL/hr over 30 Minutes Intravenous On call to O.R. 07/12/18 0736 07/12/18 0855        Note: Portions of this report may have been transcribed using voice recognition software. Every effort was made to ensure accuracy;  however, inadvertent computerized transcription errors may be present.   Any transcriptional errors that result from this process are unintentional.     Adin Hector, MD, FACS, MASCRS Gastrointestinal and Minimally Invasive Surgery    [address], [place], [place] [address] [phone] Main / Paging [phone] Fax

## 2018-07-21 NOTE — Progress Notes (Signed)
Occupational Therapy Treatment Patient Details Name: Deborah Arroyo MRN: [id number] DOB: [birth year] Today's Date: 07/21/2018    History of present illness Pt is a 67 YO female s/p cystoscopy with bilateral retrograde pyelogram and bilateral open-ended urethral catheter placement; laparoscopic low anterior resection with coloproctostomy, diverting loop ileostomy, flexible sigmoidoscopy, bilateral TAP blocks on 07/12/18. Surgeries secondary to rectal cancer, diagnosed May 2019. Pt with PMH including chemo/radiation for cancer earlier in 2019, cataracts, cervical cancer 1979, depression, dyspnea, hyponatremia with syncope, osteopenia, smoker. Past surgical history includes abdominal hysterectomy, cholecystectomy, L RTC repair.   OT comments  Goals met today.  Follow Up Recommendations  Supervision/Assistance - 24 hour    Equipment Recommendations  3 in 1 bedside commode    Recommendations for Other Services      Precautions / Restrictions Precautions Precautions: Fall Precaution Comments: abdominal - log rolling technique Restrictions Weight Bearing Restrictions: No       Mobility Bed Mobility Overal bed mobility: Modified Independent             General bed mobility comments: hob raised  Transfers   Equipment used: 4-wheeled walker   Sit to Stand: Modified independent (Device/Increase time)              Balance                                           ADL either performed or assessed with clinical judgement   ADL                                         General ADL Comments: pt can perform ADL with set up only; crosses legs comfortably for adls     Vision       Perception     Praxis      Cognition Arousal/Alertness: Awake/alert Behavior During Therapy: WFL for tasks assessed/performed Overall Cognitive Status: Within Functional Limits for tasks assessed                                           Exercises     Shoulder Instructions       General Comments ambulated in hall at [place] / mod I level.  Discussed gathering clothes. Pt states she will ask for help if she needs it    Pertinent Vitals/ Pain       Pain Score: 4  Pain Location: abdomen with activity Pain Descriptors / Indicators: Sore Pain Intervention(s): Limited activity within patient's tolerance;Monitored during session  Home Living                                          Prior Functioning/Environment              Frequency           Progress Toward Goals  OT Goals(current goals can now be found in the care plan section)  Progress towards OT goals: Goals met/education completed, patient discharged from [place]  AM-PAC PT "6 Clicks" Daily Activity     Outcome Measure   Help from another person eating meals?: None Help from another person taking care of personal grooming?: A Little Help from another person toileting, which includes using toliet, bedpan, or urinal?: A Little Help from another person bathing (including washing, rinsing, drying)?: A Little Help from another person to put on and taking off regular upper body clothing?: A Little Help from another person to put on and taking off regular lower body clothing?: A Lot 6 Click Score: 18    End of Session        Activity Tolerance Patient tolerated treatment well   Patient Left in bed;with call bell/phone within reach   Nurse Communication          Time: 2023-3435 OT Time Calculation (min): 22 min  Charges: OT General Charges $OT Visit: 1 Visit OT Treatments $Self Care/Home Management : 8-22 mins  Lesle Chris, OTR/L Acute Rehabilitation Services [phone] [facility] pager [phone] office 07/21/2018   [place] 07/21/2018, 3:55 PM

## 2018-07-21 NOTE — Progress Notes (Signed)
Note: Portions of this report may have been transcribed using voice recognition software. Every effort was made to ensure accuracy; however, inadvertent computerized transcription errors may be present.   Any transcriptional errors that result from this process are unintentional.              Deborah Arroyo  12/21/1950 [id number]  Patient Care Team: Mike Craze, DO as PCP - General  This patient is a 67 y.o.female   Potassium &  magnesium low.  We will more aggressively replace.  Patient Active Problem List   Diagnosis Date Noted  . Rectal cancer s/p LAR/diverting loop ileostomy 07/12/2018 03/06/2018    Priority: High  . 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 Supplemental oxygen dependent 07/21/2018  . Benign neoplasm of transverse colon   . Benign neoplasm of descending colon   . Great toe pain, right 04/02/2018  . Osteopenia 12/15/2017  . Cervicogenic headache 09/08/2017  . Rectal mass 08/25/2017  . Bilateral knee pain 08/25/2017  . Health care maintenance 09/04/2015  . Tobacco abuse 09/04/2015    Past Medical History:  Diagnosis Date  . Cancer ([place]) 03/01/2018   rectal cancer=had chemo pills and radation   . Cataract   . Cervix cancer ([place]) 1979  . Depression   . Dyspnea   . Hypoglycemia   . Hyponatremia    syncope with this and admitted twice due to this issue   . Osteopenia   . Rectal bleeding   . Smoker     Past Surgical History:  Procedure Laterality Date  . ABDOMINAL HYSTERECTOMY    . APPENDECTOMY    . BIOPSY  07/04/2018   Procedure: BIOPSY;  Surgeon: Jerene Bears, MD;  Location: [facility];  Service: Gastroenterology;;  . CHOLECYSTECTOMY    . COLONOSCOPY  03/01/2018   Dr.Pyrtle  . COLONOSCOPY WITH PROPOFOL N/A 07/04/2018   Procedure: COLONOSCOPY WITH PROPOFOL;  Surgeon: Jerene Bears, MD;  Location: [facility];  Service: Gastroenterology;  Laterality: N/A;  . CYSTOSCOPY WITH STENT  PLACEMENT Bilateral 07/12/2018   Procedure: CYSTOSCOPY WITH STENT PLACEMENT;  Surgeon: Lucas Mallow, MD;  Location: [facility];  Service: Urology;  Laterality: Bilateral;  . DIVERTING ILEOSTOMY N/A 07/12/2018   Procedure: DIVERTING LOOP ILEOSTOMY;  Surgeon: Ileana Roup, MD;  Location: [facility];  Service: General;  Laterality: N/A;  . EYE SURGERY    . FLEXIBLE SIGMOIDOSCOPY N/A 07/12/2018   Procedure: FLEXIBLE SIGMOIDOSCOPY;  Surgeon: Ileana Roup, MD;  Location: [facility];  Service: General;  Laterality: N/A;  . LAPAROSCOPIC LOW ANTERIOR RESECTION N/A 07/12/2018   Procedure: LAPAROSCOPIC LOW ANTERIOR RESECTION WITH COLOPROSTOSTOMY, ERAS PATHWAY;  Surgeon: Ileana Roup, MD;  Location: [facility];  Service: General;  Laterality: N/A;  . POLYPECTOMY  07/04/2018   Procedure: POLYPECTOMY;  Surgeon: Jerene Bears, MD;  Location: [facility];  Service: Gastroenterology;;  . SHOULDER SURGERY Left    Rotary Cuff Tear  . TUBAL LIGATION      Social History   Socioeconomic History  . Marital status: Divorced    Spouse name: Not on file  . Number of children: 3  . Years of education: Not on file  . Highest education level: 8th grade  Occupational History  . Occupation: retired  Scientific laboratory technician  . Financial resource strain: Not on file  . Food insecurity:    Worry: Not on file    Inability:  Not on file  . Transportation needs:    Medical: Not on file    Non-medical: Not on file  Tobacco Use  . Smoking status: Current Every Day Smoker    Packs/day: 1.50    Types: Cigarettes  . Smokeless tobacco: Never Used  Substance and Sexual Activity  . Alcohol use: No    Alcohol/week: 0.0 standard drinks  . Drug use: No  . Sexual activity: Never  Lifestyle  . Physical activity:    Days per week: Not on file    Minutes per session: Not on file  . Stress: Not on file  Relationships  . Social connections:    Talks on phone: Not on file    Gets together: Not on file    Attends  religious service: Not on file    Active member of club or organization: Not on file    Attends meetings of clubs or organizations: Not on file    Relationship status: Not on file  . Intimate partner violence:    Fear of current or ex partner: No    Emotionally abused: No    Physically abused: No    Forced sexual activity: No  Other Topics Concern  . Not on file  Social History Narrative   Divorced, lives with 2 sons and 1 grandchild, I dog. She drinks 3-4 cups of coffee a day. Is very active around the house. Has 3 brothers and 6 sisters.    Family History  Problem Relation Age of Onset  . Stomach cancer Sister   . Ovarian cancer Sister   . Cancer - Ovarian Sister   . Dementia Mother   . Uterine cancer Mother   . Endometrial cancer Mother   . Prostate cancer Brother   . Cancer - Prostate Brother   . Cancer Other   . Cancer Brother        lung/brain  . Breast cancer Daughter   . Alzheimer's disease Father   . Colon cancer Neg Hx   . Esophageal cancer Neg Hx   . Pancreatic cancer Neg Hx   . Rectal cancer Neg Hx   . Colon polyps Neg Hx     Current Facility-Administered Medications  Medication Dose Route Frequency Provider Last Rate Last Dose  . 0.9 %  sodium chloride infusion  250 mL Intravenous PRN Michael Boston, MD      . acetaminophen (TYLENOL) tablet 1,000 mg  1,000 mg Oral Q8H Simaan, Darci Current, PA-C   1,000 mg at 07/21/18 1400  . alum & mag hydroxide-simeth (MAALOX/MYLANTA) 200-200-20 MG/5ML suspension 30 mL  30 mL Oral Q6H PRN Ileana Roup, MD   30 mL at 07/16/18 2208  . diphenhydrAMINE (BENADRYL) 12.5 MG/5ML elixir 12.5 mg  12.5 mg Oral Q6H PRN Ileana Roup, MD       Or  . diphenhydrAMINE (BENADRYL) injection 12.5 mg  12.5 mg Intravenous Q6H PRN Ileana Roup, MD   12.5 mg at 07/16/18 0554  . feeding supplement (ENSURE SURGERY) liquid 237 mL  237 mL Oral BID BM Ileana Roup, MD   237 mL at 07/19/18 1421  . ferrous sulfate tablet 325  mg  325 mg Oral BID WC Michael Boston, MD   325 mg at 07/21/18 0840  . heparin injection 5,000 Units  5,000 Units Subcutaneous Q8H Ileana Roup, MD   5,000 Units at 07/21/18 1400  . hydrALAZINE (APRESOLINE) injection 10 mg  10 mg Intravenous Q2H PRN Ileana Roup, MD      .  HYDROmorphone (DILAUDID) injection 0.5 mg  0.5 mg Intravenous Q3H PRN Ileana Roup, MD   0.5 mg at 07/20/18 1022  . Influenza vac split quadrivalent PF (FLUZONE HIGH-DOSE) injection 0.5 mL  0.5 mL Intramuscular Tomorrow-1000 Ileana Roup, MD      . lactated ringers bolus 1,000 mL  1,000 mL Intravenous TID PRN Michael Boston, MD      . loperamide (IMODIUM) capsule 2 mg  2 mg Oral QAC lunch Michael Boston, MD   2 mg at 07/21/18 1214  . loperamide (IMODIUM) capsule 2 mg  2 mg Oral QAC supper Michael Boston, MD      . loperamide (IMODIUM) capsule 4 mg  4 mg Oral Ardeen Fillers, MD      . loperamide (IMODIUM) capsule 4 mg  4 mg Oral Q breakfast Michael Boston, MD   4 mg at 07/21/18 0840  . magnesium sulfate IVPB 4 g 100 mL  4 g Intravenous Once Michael Boston, MD      . ondansetron [facility]) tablet 4 mg  4 mg Oral Q6H PRN Ileana Roup, MD       Or  . ondansetron [facility]) injection 4 mg  4 mg Intravenous Q6H PRN Ileana Roup, MD   4 mg at 07/18/18 2301  . pneumococcal 23 valent vaccine (PNU-IMMUNE) injection 0.5 mL  0.5 mL Intramuscular Tomorrow-1000 Ileana Roup, MD      . polycarbophil (FIBERCON) tablet 1,250 mg  1,250 mg Oral BID Michael Boston, MD   1,250 mg at 07/21/18 1056  . potassium chloride 10 mEq in 100 mL IVPB  10 mEq Intravenous Q1 Hr x 4 Sinead Hockman, Remo Lipps, MD      . potassium chloride SA (K-DUR,KLOR-CON) CR tablet 40 mEq  40 mEq Oral BID Jill Alexanders, PA-C   40 mEq at 07/21/18 1400  . saccharomyces boulardii (FLORASTOR) capsule 250 mg  250 mg Oral BID Ileana Roup, MD   250 mg at 07/21/18 1056  . sodium chloride flush (NS) 0.9 % injection 3 mL  3 mL  Intravenous Gorden Harms, MD      . sodium chloride flush (NS) 0.9 % injection 3 mL  3 mL Intravenous PRN Michael Boston, MD      . traMADol Veatrice Bourbon) tablet 50 mg  50 mg Oral Q6H PRN Ileana Roup, MD   50 mg at 07/21/18 1214     Allergies  Allergen Reactions  . Aleve [Naproxen Sodium] Other (See Comments)    Eyes and throat swell up    BP 111/65 (BP Location: Right Arm)   Pulse 67   Temp 98.5 F (36.9 C) (Oral)   Resp 18   Ht 5\' 1"  (1.549 m)   Wt 51.6 kg   SpO2 98%   BMI 21.49 kg/m   Dg Chest Port 1 View  Result Date: 07/14/2018 CLINICAL DATA:  Cough EXAM: PORTABLE CHEST 1 VIEW COMPARISON:  Chest CT 03/03/2018.  Chest x-ray 12/10/2008 FINDINGS: Mild cardiomegaly. Mild interstitial prominence throughout the lungs. Patchy bilateral lower lobe airspace opacities. No definite visible significant effusions or acute bony abnormality. IMPRESSION: Cardiomegaly. Mild diffuse interstitial prominence throughout the lungs with patchy bilateral lower lobe airspace opacities. Findings could reflect interstitial edema. Cannot exclude pneumonia in the lung bases. Electronically Signed   By: Rolm Baptise M.D.   On: 07/14/2018 08:44   Dg C-arm 1-60 Min-no Report  Result Date: 07/12/2018 Fluoroscopy was utilized by the requesting physician.  No radiographic interpretation.

## 2018-07-22 DIAGNOSIS — D124 Benign neoplasm of descending colon: Secondary | ICD-10-CM | POA: Diagnosis not present

## 2018-07-22 DIAGNOSIS — D123 Benign neoplasm of transverse colon: Secondary | ICD-10-CM | POA: Diagnosis not present

## 2018-07-22 LAB — CREATININE, SERUM
Creatinine, Ser: 0.61 mg/dL (ref 0.44–1.00)
GFR calc Af Amer: >60 mL/min (ref >60)
GFR calc non Af Amer: >60 mL/min (ref >60)

## 2018-07-22 LAB — POTASSIUM: Potassium: 4.2 mmol/L (ref 3.5–5.1)

## 2018-07-22 LAB — MAGNESIUM: Magnesium: 2.1 mg/dL (ref 1.7–2.4)

## 2018-07-22 MED ORDER — FERROUS SULFATE 325 (65 FE) MG PO TABS: 325.0000 mg | ORAL_TABLET | Freq: Two times a day (BID) | ORAL | 3 refills | Status: DC

## 2018-07-22 NOTE — Progress Notes (Signed)
On call MD paged due to patient refusing IV Potassium runs. Given orders to only administer the 40 mEq at 2200 and discontinue the 4 IV Potassium runs. Will continue to monitor.

## 2018-07-22 NOTE — Plan of Care (Signed)
Pt in bed this am; no complaints or concerns noted other than "bulge" at ostomy site. Emptying ostomy bag and recording output with no issues. Pain controlled. Pt hoping to go home. Will continue to monitor.

## 2018-07-22 NOTE — Progress Notes (Signed)
Patient ID: Deborah Arroyo, female   DOB: Mar 05, 1951, 67 y.o.   MRN: [id number] 10 Days Post-Op   Subjective: Feels well today.  Anxious to go home.  Has noticed some bulging around her ileostomy.  No significant pain.  Objective: Vital signs in last 24 hours: Temp:  [98.1 F (36.7 C)-98.2 F (36.8 C)] 98.2 F (36.8 C) (09/21 0436) Pulse Rate:  [67-70] 67 (09/21 0436) Resp:  [18-20] 18 (09/21 0436) BP: (99-114)/(60-61) 99/61 (09/21 0436) SpO2:  [91 %-94 %] 91 % (09/21 0436) Weight:  [50.8 kg] 50.8 kg (09/21 0553) Last BM Date: 07/21/18  Intake/Output from previous day: 09/20 0701 - 09/21 0700 In: 655.1 [P.O.:360; I.V.:241.7; IV Piggyback:53.4] Out: 2175 [Urine:1150; Stool:1025] Intake/Output this shift: No intake/output data recorded.  General appearance: alert, cooperative, no distress and Good spirits GI: Nondistended.  Soft and nontender.  Ileostomy healthy.  There is a moderate bulge at the ileostomy site resolved when she lies down. Incision/Wound: No erythema or drainage.  Lab Results:  Recent Labs    07/21/18 1013  WBC 8.0  HGB 10.5*  HCT 31.2*  PLT 472*   BMET Recent Labs    07/21/18 1013 07/22/18 0434  NA 140  --   K 3.1* 4.2  CL 102  --   CO2 30  --   GLUCOSE 142*  --   BUN 5*  --   CREATININE 0.51 0.61  CALCIUM 8.8*  --      Studies/Results: No results found.  Anti-infectives: Anti-infectives (From admission, onward)   Start     Dose/Rate Route Frequency Ordered Stop   07/12/18 1400  neomycin (MYCIFRADIN) tablet 1,000 mg  Status:  Discontinued     1,000 mg Oral 3 times per day 07/12/18 0736 07/12/18 0739   07/12/18 1400  metroNIDAZOLE (FLAGYL) tablet 1,000 mg  Status:  Discontinued     1,000 mg Oral 3 times per day 07/12/18 0736 07/12/18 0739   07/12/18 0745  cefoTEtan (CEFOTAN) 2 g in sodium chloride 0.9 % 100 mL IVPB     2 g 200 mL/hr over 30 Minutes Intravenous On call to O.R. 07/12/18 0736 07/12/18 0855      Assessment/Plan: s/p  Procedure(s): LAPAROSCOPIC LOW ANTERIOR RESECTION WITH COLOPROSTOSTOMY, ERAS PATHWAY DIVERTING LOOP ILEOSTOMY FLEXIBLE SIGMOIDOSCOPY CYSTOSCOPY WITH STENT PLACEMENT Doing well postoperatively.  Ileostomy output controlled.  Hypokalemia corrected.  Okay for discharge. 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  This is soft and reducible and no immediate treatment required.   LOS: 10 days    Edward Jolly 07/22/2018

## 2018-07-22 NOTE — Progress Notes (Signed)
Pt on room air sitting up in bed. Sats 94-96%. Ambulated on room air around unit x1. Sats upon return 98% on room air. CM advised of results. Pt has not been on oxygen at home previously.

## 2018-07-22 NOTE — Progress Notes (Signed)
Reviewed discharge instructions with patient; copy given for records. IV removed. Pt ready for discharge.

## 2018-07-22 NOTE — Progress Notes (Signed)
Contacted [name] to make aware of dc home today with HH. Also pt will need 3n1 bedside commode and tub bench. [name] will deliver to room prior to dc. Unit RN checked O2 sat and she was 98% while ambulating. Jonnie Finner RN CCM Case Mgmt phone [phone]

## 2018-07-24 NOTE — Discharge Summary (Signed)
Patient ID: Deborah Arroyo MRN: [id number] DOB/AGE: Oct 12, 1951 67 y.o.  Admit date: 07/12/2018 Discharge date: 07/24/2018  Discharge Diagnoses Patient Active Problem List   Diagnosis Date Noted  . 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 Supplemental oxygen dependent 07/21/2018  . Hypokalemia 07/21/2018  . Hypomagnesemia 07/21/2018  . Hypoxemia 07/13/2018  . Benign neoplasm of transverse colon   . Benign neoplasm of descending colon   . Great toe pain, right 04/02/2018  . Rectal cancer s/p LAR/diverting loop ileostomy 07/12/2018 03/06/2018  . Osteopenia 12/15/2017  . Cervicogenic headache 09/08/2017  . Rectal mass 08/25/2017  . Bilateral knee pain 08/25/2017  . Health care maintenance 09/04/2015  . Tobacco abuse 09/04/2015    Consultants None  Procedures OR 07/12/18 for laparoscopic LAR with colorectal anastomosis, flexible sigmoidoscopy, diverting loop ileostomy, cystoscopy/stents (urology)  Hospital Course: She was taken to the OR 07/12/18 for the above procedures. She recovered well from this. Over the ensuing 2-3 days, her ileostomy began functioning and her diet was gently advanced. Her ileostomy output was elevated >1.2L/day so she was started on PO fiber supplementation and PO imodium. The dose of her imodium was gradually escalated as well. Home health RN + PT + OT was recommended for a safe transition to her home environment. She had this ultimately established on 07/22/18. At that time, her ostomy output was controlled on fiber + imodium, she was tolerating a diet without issue, ambulating with appropriate assistance/devices and felt comfortable managing her new ileostomy. She was deemed stable for discharge home.   Allergies as of 07/22/2018      Reactions   Aleve [naproxen Sodium] Other (See Comments)   Eyes and throat swell up      Medication List    TAKE these medications   acetaminophen 325 MG tablet Commonly known as:   TYLENOL Take 975 mg by mouth every 6 (six) hours as needed for moderate pain.   BENEFIBER Tabs Take 1 tablet by mouth 2 (two) times daily.   ferrous sulfate 325 (65 FE) MG tablet Take 1 tablet (325 mg total) by mouth 2 (two) times daily with a meal.   loperamide 2 MG tablet Commonly known as:  IMODIUM A-D Take 1 tablet (2 mg total) by mouth 4 (four) times daily -  before meals and at bedtime.   traMADol 50 MG tablet Commonly known as:  ULTRAM Take 1 tablet (50 mg total) by mouth every 6 (six) hours as needed for up to 7 days (postop pain not controlled with tylenol+ibuprofen).        Follow-up Information    Ileana Roup, MD Follow up in 1 week(s).   Specialty:  General Surgery Contact information: [place] [address] [phone]        [facility] - Dme Follow up.   Why:  rollator;bedside commode Contact information: [address] [place] [address] [phone]        [facility]-Home Follow up.   Specialty:  Home Health Services Why:  Home Health RN, Physical Therapy and Occupational Therapy- agency will call to arrange visit Contact information: [address] [place] [address] [place]. Dema Severin, M.D. [facility]

## 2018-07-25 DIAGNOSIS — Z483 Aftercare following surgery for neoplasm: Secondary | ICD-10-CM | POA: Diagnosis not present

## 2018-07-25 DIAGNOSIS — Z432 Encounter for attention to ileostomy: Secondary | ICD-10-CM | POA: Diagnosis not present

## 2018-07-25 DIAGNOSIS — C20 Malignant neoplasm of rectum: Secondary | ICD-10-CM | POA: Diagnosis not present

## 2018-07-25 DIAGNOSIS — K435 Parastomal hernia without obstruction or  gangrene: Secondary | ICD-10-CM | POA: Diagnosis not present

## 2018-07-25 DIAGNOSIS — Z9049 Acquired absence of other specified parts of digestive tract: Secondary | ICD-10-CM | POA: Diagnosis not present

## 2018-07-26 ENCOUNTER — Telehealth: Payer: Self-pay | Admitting: Oncology

## 2018-07-26 DIAGNOSIS — K435 Parastomal hernia without obstruction or  gangrene: Secondary | ICD-10-CM | POA: Diagnosis not present

## 2018-07-26 DIAGNOSIS — C20 Malignant neoplasm of rectum: Secondary | ICD-10-CM | POA: Diagnosis not present

## 2018-07-26 DIAGNOSIS — Z432 Encounter for attention to ileostomy: Secondary | ICD-10-CM | POA: Diagnosis not present

## 2018-07-26 DIAGNOSIS — Z9049 Acquired absence of other specified parts of digestive tract: Secondary | ICD-10-CM | POA: Diagnosis not present

## 2018-07-26 DIAGNOSIS — Z483 Aftercare following surgery for neoplasm: Secondary | ICD-10-CM | POA: Diagnosis not present

## 2018-07-26 NOTE — Progress Notes (Signed)
Patient discussed at GI tumor Board this AM. Scheduling message sent for appointment with Dr. Benay Spice week of 07/31/18 to discuss adjuvant chemotherapy. I called patient and she voiced frustration at "all these appointments." I explained that Dr. Benay Spice would like to review the pathology and discuss potential next steps. Patient agreed to come in.

## 2018-07-26 NOTE — Telephone Encounter (Signed)
Scheduled appt per 9/25 sch  Message - pt is aware of appt date and time.

## 2018-07-27 ENCOUNTER — Emergency Department (HOSPITAL_COMMUNITY): Payer: Medicare Other

## 2018-07-27 ENCOUNTER — Emergency Department (HOSPITAL_COMMUNITY)
Admission: EM | Admit: 2018-07-27 | Discharge: 2018-07-27 | Disposition: A | Discharge status: Home / Self Care | Payer: Medicare Other | Attending: Emergency Medicine | Admitting: Emergency Medicine

## 2018-07-27 ENCOUNTER — Encounter (HOSPITAL_COMMUNITY): Payer: Self-pay

## 2018-07-27 ENCOUNTER — Other Ambulatory Visit: Payer: Self-pay

## 2018-07-27 DIAGNOSIS — D72829 Elevated white blood cell count, unspecified: Secondary | ICD-10-CM | POA: Insufficient documentation

## 2018-07-27 DIAGNOSIS — R109 Unspecified abdominal pain: Secondary | ICD-10-CM

## 2018-07-27 DIAGNOSIS — R51 Headache: Secondary | ICD-10-CM | POA: Insufficient documentation

## 2018-07-27 DIAGNOSIS — Z79899 Other long term (current) drug therapy: Secondary | ICD-10-CM | POA: Insufficient documentation

## 2018-07-27 DIAGNOSIS — R112 Nausea with vomiting, unspecified: Secondary | ICD-10-CM | POA: Diagnosis not present

## 2018-07-27 DIAGNOSIS — R52 Pain, unspecified: Secondary | ICD-10-CM | POA: Diagnosis not present

## 2018-07-27 DIAGNOSIS — R000 Tachycardia, unspecified: Secondary | ICD-10-CM | POA: Diagnosis not present

## 2018-07-27 DIAGNOSIS — Z85048 Personal history of other malignant neoplasm of rectum, rectosigmoid junction, and anus: Secondary | ICD-10-CM | POA: Diagnosis not present

## 2018-07-27 DIAGNOSIS — G43909 Migraine, unspecified, not intractable, without status migrainosus: Secondary | ICD-10-CM | POA: Diagnosis not present

## 2018-07-27 DIAGNOSIS — F17210 Nicotine dependence, cigarettes, uncomplicated: Secondary | ICD-10-CM | POA: Insufficient documentation

## 2018-07-27 DIAGNOSIS — G4489 Other headache syndrome: Secondary | ICD-10-CM | POA: Diagnosis not present

## 2018-07-27 DIAGNOSIS — R1084 Generalized abdominal pain: Secondary | ICD-10-CM | POA: Diagnosis not present

## 2018-07-27 DIAGNOSIS — R001 Bradycardia, unspecified: Secondary | ICD-10-CM | POA: Diagnosis not present

## 2018-07-27 LAB — URINALYSIS, ROUTINE W REFLEX MICROSCOPIC
Bacteria, UA: NONE SEEN
Bilirubin Urine: NEGATIVE
Glucose, UA: NEGATIVE mg/dL
KETONES UR: NEGATIVE mg/dL
Leukocytes, UA: NEGATIVE
Nitrite: NEGATIVE
PH: 5 (ref 5.0–8.0)
Protein, ur: NEGATIVE mg/dL
SPECIFIC GRAVITY, URINE: 1.018 (ref 1.005–1.030)

## 2018-07-27 LAB — CBC WITH DIFFERENTIAL/PLATELET
Basophils Absolute: 0 10*3/uL (ref 0.0–0.1)
Basophils Relative: 0 %
EOS PCT: 0 %
Eosinophils Absolute: 0.1 10*3/uL (ref 0.0–0.7)
HCT: 34.4 % — ABNORMAL LOW (ref 36.0–46.0)
Hemoglobin: 11.4 g/dL — ABNORMAL LOW (ref 12.0–15.0)
LYMPHS ABS: 0.6 10*3/uL — AB (ref 0.7–4.0)
LYMPHS PCT: 4 %
MCH: 32.6 pg (ref 26.0–34.0)
MCHC: 33.1 g/dL (ref 30.0–36.0)
MCV: 98.3 fL (ref 78.0–100.0)
MONO ABS: 0.9 10*3/uL (ref 0.1–1.0)
MONOS PCT: 5 %
Neutro Abs: 16.1 10*3/uL — ABNORMAL HIGH (ref 1.7–7.7)
Neutrophils Relative %: 91 %
PLATELETS: 672 10*3/uL — AB (ref 150–400)
RBC: 3.5 MIL/uL — ABNORMAL LOW (ref 3.87–5.11)
RDW: 14.3 % (ref 11.5–15.5)
WBC: 17.7 10*3/uL — ABNORMAL HIGH (ref 4.0–10.5)

## 2018-07-27 LAB — BASIC METABOLIC PANEL
Anion gap: 9 (ref 5–15)
BUN: 13 mg/dL (ref 8–23)
CHLORIDE: 102 mmol/L (ref 98–111)
CO2: 26 mmol/L (ref 22–32)
Calcium: 9.2 mg/dL (ref 8.9–10.3)
Creatinine, Ser: 0.88 mg/dL (ref 0.44–1.00)
GFR calc Af Amer: >60 mL/min (ref >60)
GFR calc non Af Amer: >60 mL/min (ref >60)
GLUCOSE: 105 mg/dL — AB (ref 70–99)
POTASSIUM: 4.1 mmol/L (ref 3.5–5.1)
Sodium: 137 mmol/L (ref 135–145)

## 2018-07-27 MED ORDER — IOPAMIDOL (ISOVUE-300) INJECTION 61%
INTRAVENOUS | Status: AC
  Filled 2018-07-27: qty 100

## 2018-07-27 MED ORDER — IOHEXOL 300 MG/ML  SOLN
30.0000 mL | Freq: Once | INTRAMUSCULAR | Status: AC | PRN
  Administered 2018-07-27: 30 mL via ORAL

## 2018-07-27 MED ORDER — IOPAMIDOL (ISOVUE-300) INJECTION 61%
100.0000 mL | Freq: Once | INTRAVENOUS | Status: AC | PRN
  Administered 2018-07-27: 100 mL via INTRAVENOUS

## 2018-07-27 MED ORDER — METOCLOPRAMIDE HCL 5 MG/ML IJ SOLN
10.0000 mg | Freq: Once | INTRAMUSCULAR | Status: AC
  Administered 2018-07-27: 10 mg via INTRAVENOUS
  Filled 2018-07-27: qty 2

## 2018-07-27 MED ORDER — KETOROLAC TROMETHAMINE 15 MG/ML IJ SOLN
15.0000 mg | Freq: Once | INTRAMUSCULAR | Status: AC
  Administered 2018-07-27: 15 mg via INTRAVENOUS
  Filled 2018-07-27: qty 1

## 2018-07-27 MED ORDER — IOHEXOL 300 MG/ML  SOLN: 100.0000 mL | Freq: Once | INTRAMUSCULAR | Status: DC | PRN

## 2018-07-27 NOTE — ED Triage Notes (Signed)
Patient BIB EMS from home with complaints of new onset abdominal pain, nausea, and vomiting. Patient reports she has history of colon cancer and had colostomy placed 2 weeks ago. Patient reports starting last night, her stomach started causing her pain and her colostomy started leaking. Patient's colostomy dressing states the date 07/24/18. Patient stoma is pink and moist to visual assessment. Patient also reports migraine, with hx of migraines.  18 guage Left AC PIV placed in EMS, Patient received 137mcg IV Fentanyl at approximately 0915, and 8mg  IV zofran at approx 0915. Patient also has received 575mL IV NS via EMS.   Patient oxygen was 92% for EMS on [facility], on [facility] patient sats 98%. Patient has smoking history

## 2018-07-27 NOTE — ED Notes (Signed)
Bed: WA17 Expected date:  Expected time:  Means of arrival:  Comments: 67yo female-migraine, stomach pain

## 2018-07-27 NOTE — Progress Notes (Signed)
Subjective Pt is well known to me. Discharged this past weekend - now POD#15 from laparoscopic LAR with diverting loop ileostomy. Had stoma bag leakage and an episode of n/v early this morning. Has felt better since. She denies any nausea/vomiting now. Has been tolerating liquids and bites of food without issue. Denies any abdominal pain whatsoever. Denies distention/bloating. Denies fever nor chills at home\  She has been recording output and states its between 26-28 oz per 24hrs. She is taking her imodium and fiber as prescribed. Staying hydrated - reports 60-64oz by mouth per day.  Objective: Vital signs in last 24 hours: Temp:  [97.3 F (36.3 C)] 97.3 F (36.3 C) (09/26 0940) Pulse Rate:  [67-90] 81 (09/26 1554) Resp:  [14-18] 14 (09/26 1554) BP: (84-129)/(57-70) 94/68 (09/26 1554) SpO2:  [92 %-100 %] 93 % (09/26 1554) Weight:  [50.8 kg] 50.8 kg (09/26 0929)    Intake/Output from previous day: No intake/output data recorded. Intake/Output this shift: No intake/output data recorded.  Gen: NAD, comfortable CV: RRR Pulm: Normal work of breathing Abd: Soft, NT/ND; incisions c/d/i without erythema. Diverting ileostomy pink with succus in appliance. 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 ABG No results for input(s): PHART, HCO3 in the last 72 hours.  Invalid input(s): PCO2, PO2  Studies/Results:  Anti-infectives: Anti-infectives (From admission, onward)   None       Assessment/Plan: Patient Active Problem List   Diagnosis Date Noted  . 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 Supplemental oxygen dependent  07/21/2018  . Hypokalemia 07/21/2018  . Hypomagnesemia 07/21/2018  . Hypoxemia 07/13/2018  . Benign neoplasm of transverse colon   . Benign neoplasm of descending colon   . Great toe pain, right 04/02/2018  . Rectal cancer s/p LAR/diverting loop ileostomy 07/12/2018 03/06/2018  . Osteopenia 12/15/2017  . Cervicogenic headache 09/08/2017  . Rectal mass 08/25/2017  . Bilateral knee pain 08/25/2017  . Health care maintenance 09/04/2015  . Tobacco abuse 09/04/2015   CT A/P reviewed has known parastomal hernia but not obstructed, having output - contrast fills bag.  PLAN -RN to change appliance -PO challenge - if tolerates, ok for discharge with strict ER warnings -She has f/u with my this Monday   LOS: 0 days    Sharon Mt. Dema Severin, M.D. [facility]

## 2018-07-27 NOTE — ED Notes (Signed)
Patient back from CT.

## 2018-07-27 NOTE — ED Notes (Signed)
Patient ostomy leaking. New ostomy placed to patient stoma.

## 2018-07-27 NOTE — ED Provider Notes (Signed)
  Physical Exam  BP 94/68 (BP Location: Right Arm)   Pulse 81   Temp (!) 97.3 F (36.3 C) (Oral)   Resp 14   Wt 50.8 kg   SpO2 93%   BMI 21.16 kg/m   Physical Exam  ED Course/Procedures   Clinical Course as of Jul 28 1623  Thu Jul 27, 2018  1230 Upon reassessment, patient reports that the headache has resolved. She continued to have no neurologic complains. Strict return precautions discussed, pt will return to the ER if there is visual complains, seizures, altered mental status, loss of consciousness, dizziness, new focal weakness, or numbness.      [AN]    Clinical Course User Index [AN] Varney Biles, MD    Procedures  MDM  Received patient in signout.  CT scan done does not show infection.  Feels she can manage this at home.  Seen by surgery in the ER.  Has short-term follow-up.  Discharge home.      Davonna Belling, MD 07/27/18 [phone]

## 2018-07-27 NOTE — Discharge Instructions (Signed)
Follow-up with Dr. Dema Severin as planned.

## 2018-07-28 DIAGNOSIS — K435 Parastomal hernia without obstruction or  gangrene: Secondary | ICD-10-CM | POA: Diagnosis not present

## 2018-07-28 DIAGNOSIS — Z483 Aftercare following surgery for neoplasm: Secondary | ICD-10-CM | POA: Diagnosis not present

## 2018-07-28 DIAGNOSIS — C20 Malignant neoplasm of rectum: Secondary | ICD-10-CM | POA: Diagnosis not present

## 2018-07-28 DIAGNOSIS — Z9049 Acquired absence of other specified parts of digestive tract: Secondary | ICD-10-CM | POA: Diagnosis not present

## 2018-07-28 DIAGNOSIS — Z432 Encounter for attention to ileostomy: Secondary | ICD-10-CM | POA: Diagnosis not present

## 2018-07-29 DIAGNOSIS — D124 Benign neoplasm of descending colon: Secondary | ICD-10-CM | POA: Diagnosis not present

## 2018-07-29 DIAGNOSIS — D123 Benign neoplasm of transverse colon: Secondary | ICD-10-CM | POA: Diagnosis not present

## 2018-07-31 ENCOUNTER — Telehealth: Payer: Self-pay | Admitting: Oncology

## 2018-07-31 ENCOUNTER — Inpatient Hospital Stay: Payer: Medicare Other | Attending: Oncology | Admitting: Oncology

## 2018-07-31 VITALS — BP 114/73 | HR 75 | Temp 97.9°F | Resp 18 | Ht 61.0 in | Wt 107.0 lb

## 2018-07-31 DIAGNOSIS — C20 Malignant neoplasm of rectum: Secondary | ICD-10-CM

## 2018-07-31 DIAGNOSIS — Z432 Encounter for attention to ileostomy: Secondary | ICD-10-CM | POA: Diagnosis not present

## 2018-07-31 DIAGNOSIS — D124 Benign neoplasm of descending colon: Secondary | ICD-10-CM | POA: Diagnosis not present

## 2018-07-31 DIAGNOSIS — Z932 Ileostomy status: Secondary | ICD-10-CM | POA: Insufficient documentation

## 2018-07-31 DIAGNOSIS — F17210 Nicotine dependence, cigarettes, uncomplicated: Secondary | ICD-10-CM | POA: Diagnosis not present

## 2018-07-31 DIAGNOSIS — R109 Unspecified abdominal pain: Secondary | ICD-10-CM | POA: Insufficient documentation

## 2018-07-31 DIAGNOSIS — D123 Benign neoplasm of transverse colon: Secondary | ICD-10-CM | POA: Diagnosis not present

## 2018-07-31 DIAGNOSIS — I700 Atherosclerosis of aorta: Secondary | ICD-10-CM | POA: Insufficient documentation

## 2018-07-31 DIAGNOSIS — Z86010 Personal history of colonic polyps: Secondary | ICD-10-CM | POA: Insufficient documentation

## 2018-07-31 DIAGNOSIS — R112 Nausea with vomiting, unspecified: Secondary | ICD-10-CM | POA: Insufficient documentation

## 2018-07-31 DIAGNOSIS — N281 Cyst of kidney, acquired: Secondary | ICD-10-CM | POA: Diagnosis not present

## 2018-07-31 DIAGNOSIS — Z809 Family history of malignant neoplasm, unspecified: Secondary | ICD-10-CM | POA: Insufficient documentation

## 2018-07-31 DIAGNOSIS — Z483 Aftercare following surgery for neoplasm: Secondary | ICD-10-CM | POA: Diagnosis not present

## 2018-07-31 DIAGNOSIS — Z923 Personal history of irradiation: Secondary | ICD-10-CM | POA: Insufficient documentation

## 2018-07-31 DIAGNOSIS — J449 Chronic obstructive pulmonary disease, unspecified: Secondary | ICD-10-CM | POA: Diagnosis not present

## 2018-07-31 DIAGNOSIS — K435 Parastomal hernia without obstruction or  gangrene: Secondary | ICD-10-CM | POA: Diagnosis not present

## 2018-07-31 DIAGNOSIS — Z9049 Acquired absence of other specified parts of digestive tract: Secondary | ICD-10-CM | POA: Diagnosis not present

## 2018-07-31 NOTE — Telephone Encounter (Signed)
Scheduled apt per per 9/30 los - pt is aware of appt date and time.

## 2018-07-31 NOTE — Progress Notes (Signed)
[facility] OFFICE PROGRESS NOTE   Diagnosis: Rectal cancer  INTERVAL HISTORY:   Deborah Arroyo underwent a laparoscopic low anterior resection diverting loop ileostomy on 07/12/2018. The pathology ([id number]) revealed a 2.5 cm moderately differentiated adenocarcinoma extending into perirectal connective tissue.  11 lymph nodes were negative for metastatic disease.  The resection margins are negative.  No lymphovascular or perineural invasion.  No tumor deposits.  Treatment effect is present.  The tumor returned MSI-stable with no loss of mismatch repair protein expression.  She empties the ileostomy frequently.  The stool is intermittently semi-formed.  She is taking Imodium approximately 4 times daily.  She reports adequate oral intake.  Complains of pain at the right abdomen and right lower back since the time of surgery.  She did not return for follow-up after her last appointment here in June. Objective:  Vital signs in last 24 hours:  Blood pressure 114/73, pulse 75, temperature 97.9 F (36.6 C), temperature source Oral, resp. rate 18, height '5\' 1"'$  (1.549 m), weight 107 lb (48.5 kg), SpO2 100 %.    HEENT: The mucous membranes are moist, no thrush Resp: Lungs clear bilaterally Cardio: Regular rate and rhythm GI: No hepatomegaly, right mid abdomen ileostomy with dark liquid stool, tender in the right upper abdomen Vascular: No leg edema  Skin: Healed abdominal incisions  Portacath/PICC-without erythema  Lab Results:  Lab Results  Component Value Date   WBC 17.7 (H) 07/27/2018   HGB 11.4 (L) 07/27/2018   HCT 34.4 (L) 07/27/2018   MCV 98.3 07/27/2018   PLT 672 (H) 07/27/2018   NEUTROABS 16.1 (H) 07/27/2018    CMP  Lab Results  Component Value Date   NA 137 07/27/2018   K 4.1 07/27/2018   CL 102 07/27/2018   CO2 26 07/27/2018   GLUCOSE 105 (H) 07/27/2018   BUN 13 07/27/2018   CREATININE 0.88 07/27/2018   CALCIUM 9.2 07/27/2018   PROT 7.6 06/28/2018   ALBUMIN 4.2 06/28/2018   AST 22 06/28/2018   ALT 13 06/28/2018   ALKPHOS 111 06/28/2018   BILITOT 0.6 06/28/2018   GFRNONAA >60 07/27/2018   GFRAA >60 07/27/2018    Lab Results  Component Value Date   CEA1 4.2 06/28/2018    Imaging:  Dg Chest 2 View  Result Date: 07/27/2018 CLINICAL DATA:  Abdomen pain with nausea and vomiting EXAM: CHEST - 2 VIEW COMPARISON:  July 14, 2018 FINDINGS: The heart size and mediastinal contours are within normal limits. There is no focal infiltrate, pulmonary edema, or pleural effusion. The visualized skeletal structures are unremarkable. IMPRESSION: No active cardiopulmonary disease. Electronically Signed   By: Abelardo Diesel M.D.   On: 07/27/2018 14:22   Ct Abdomen Pelvis W Contrast  Result Date: 07/27/2018 CLINICAL DATA:  Acute generalized abdominal pain. Status post ileostomy placement for colon cancer. EXAM: CT ABDOMEN AND PELVIS WITH CONTRAST TECHNIQUE: Multidetector CT imaging of the abdomen and pelvis was performed using the standard protocol following bolus administration of intravenous contrast. CONTRAST:  140m ISOVUE-300 IOPAMIDOL (ISOVUE-300) INJECTION 61% COMPARISON:  CT scan of Mar 03, 2018. FINDINGS: Lower chest: No acute abnormality. Hepatobiliary: No focal liver abnormality is seen. Status post cholecystectomy. No biliary dilatation. Pancreas: Unremarkable. No pancreatic ductal dilatation or surrounding inflammatory changes. Spleen: Normal in size without focal abnormality. Adrenals/Urinary Tract: Adrenal glands appear normal. Small bilateral renal cysts are noted. No hydronephrosis or renal obstruction is noted. No renal or ureteral calculi are noted urinary bladder is unremarkable. Stomach/Bowel: Stomach appears  normal. Ileostomy is noted in right lower quadrant. There is a moderate size peristomal hernia which contains small bowel loops. Status post low anterior resection. There is no evidence of bowel obstruction or inflammation.  Vascular/Lymphatic: Aortic atherosclerosis. No enlarged abdominal or pelvic lymph nodes. Reproductive: Status post hysterectomy. No adnexal masses. Other: No abnormal fluid collection is noted. Musculoskeletal: No acute or significant osseous findings. IMPRESSION: Status post low anterior resection and ileostomy placement and right lower quadrant. Moderate size peristomal hernia is noted which contains small bowel loops, but does not result in incarceration or obstruction. No acute abnormality is noted in the abdomen or pelvis. Aortic Atherosclerosis (ICD10-I70.0). Electronically Signed   By: Marijo Conception, M.D.   On: 07/27/2018 16:02    Medications: I have reviewed the patient's current medications.   Assessment/Plan: 1. Rectal cancer, obstructing rectal mass on colonoscopy 03/01/2018-biopsy confirmed adenocarcinoma ? Incomplete colonoscopy secondary to obstructing tumor ? Staging CTs on 03/03/2018-small mediastinal/hilar lymph nodes, indeterminate lingula nodule, rectal mass, small perirectal lymph nodes, mass measured at approximately 4.9 cm from the anal verge ? MRI pelvis 03/11/2018- T3c,N2 ? Radiation/Xeloda 04/03/2018-05/11/2018 ? Colonoscopy 07/04/2018- mass at 8 cm from the dentate line, polyps removed from the hepatic flexure and descending colon-tubular adenomas ? Low anterior resection/ileostomy 07/12/2018, T3N0 moderately differentiated adenocarcinoma, margins negative, 11- lymph nodes, no lymphovascular or perineural invasion, MSI-stable, no loss of mismatch repair protein expression 2. Tobacco use 3. COPD 4. Family history of multiple cancers     Disposition: Deborah Arroyo underwent a low anterior resection earlier this month.  She had pathologic stage II disease confirmed on the surgical pathology.  I discussed the prognosis and adjuvant treatment options with Deborah Arroyo and her sister.  I recommend adjuvant systemic chemotherapy based on the pretreatment staging.  We discussed 5-FU and  oxaliplatin based therapies.  I explained the potential benefit with the addition of oxalic platinum to 5-fluorouracil.  She does not wish to consider oxaliplatin therapy.  I recommend completing a course of adjuvant capecitabine.  We discussed potential toxicities associated with capecitabine including the chance for diarrhea, mucositis, and hand/foot syndrome.  She will contact us for increased stool output while on capecitabine.  The plan is to begin adjuvant capecitabine on 08/07/2018.  She will return for an office and lab visit on 08/23/2018.  She declined an influenza vaccine.  She also declined a referral to the genetics counselor.  She understands her family members are increased risk of developing colorectal cancer and should receive appropriate screening.  25 minutes were spent with the patient today.  The majority of the time was used for counseling and coordination of care.  Betsy Coder, MD  07/31/2018  12:21 PM

## 2018-08-01 ENCOUNTER — Telehealth: Payer: Self-pay | Admitting: Pharmacist

## 2018-08-01 DIAGNOSIS — C20 Malignant neoplasm of rectum: Secondary | ICD-10-CM

## 2018-08-01 MED ORDER — CAPECITABINE 500 MG PO TABS: ORAL_TABLET | ORAL | 0 refills | Status: DC

## 2018-08-01 NOTE — Telephone Encounter (Signed)
Oral Oncology Pharmacist Encounter  Received new prescription for Xeloda (capecitabine) for the adjuvant  treatment of rectal cancer, planned duration 6 months of therapy.  Patient received neoadjuvant Xeloda plus radiation 04/03/2018-05/11/2018 Dose of Xeloda at that time was 1500mg  in AM and 1000mg  in PM on days of radiation only  Patient is s/p resection performed 07/12/2018  Xeloda will dosed at ~865 mg/m2 BID for 14 days on, 7 days off, repeated for 6 months total  Labs from Epic assessed, OK for treatment.  Current medication list in Epic reviewed, no DDIs with Xeloda identified.  Prescription has been e-scribed to the [facility] for benefits analysis and approval.  Oral Oncology Clinic will continue to follow for insurance authorization, copayment issues, initial counseling and start date.  Johny Drilling, PharmD, BCPS, BCOP  08/01/2018 2:41 PM Oral Oncology Clinic [phone]

## 2018-08-01 NOTE — Telephone Encounter (Signed)
Oral Chemotherapy Pharmacist Encounter   I spoke with patient for overview of: Xeloda (capecitabine) for the adjuvant treatment of rectal cancer, planned duration 5 more cycles of therapy.   Counseled patient on administration, dosing, side effects, monitoring, drug-food interactions, safe handling, storage, and disposal.  Patient will take Xeloda 500mg  tablets, 3 tablets (1500mg ) by mouth in AM and 2 tabs (1000mg ) by mouth in PM, within 30 minutes of finishing meals, on days 1-14 of each 21 day cycle.   Adjuvant Xeloda start date: 08/07/2018  Adverse effects include but are not limited to: fatigue, decreased blood counts, GI upset, diarrhea, and hand-foot syndrome.  Patient states she did not experience any adverse effects from the Xeloda during concurrent chemoradiation course.  Patient has anti-emetic on hand and knows to take it if nausea develops.   Patient will obtain anti diarrheal and alert the office of 4 or more loose stools above baseline.  Patient states she is managing her ileostomy output with ferrous sulfate, fiber, and loperamide.  Reviewed with patient importance of keeping a medication schedule and plan for any missed doses.  Ms. Alessandrini voiced understanding and appreciation.   All questions answered. Medication reconciliation performed and medication/allergy list updated.  Insurance authorization is not required for Xeloda at this time. Oral Oncology Patient Advocate will coordinate medication acquisition with patient from the [facility].  Patient knows to call the office with questions or concerns. Oral Oncology Clinic will continue to follow.  Johny Drilling, PharmD, BCPS, BCOP  08/01/2018   3:05 PM Oral Oncology Clinic [phone]

## 2018-08-02 MED FILL — CAPECITABINE 500 MG TABS: 500 | 21 days supply | Qty: 70 | Fill #0

## 2018-08-02 NOTE — ED Provider Notes (Signed)
[facility] DEPT Provider Note   CSN: [id number] Arrival date & time: 07/27/18  0915     History   Chief Complaint Chief Complaint  Patient presents with  . Abdominal Pain  . Migraine    HPI Deborah Arroyo is a 67 y.o. female.  HPI 67 year old female comes in with chief complaint of headaches and abdominal pain.  Patient reports that she has been having abdominal pain with nausea and vomiting for the past few days.  Patient was diagnosed with colon cancer, and she is status post partial colectomy and colostomy tube placement.  Patient denies any associated nausea, vomiting, diarrhea.  Patient also has history of migraines and reports that she is been having headaches.  Her headaches are anterior, throbbing and different in nature than her migrainous headache.  Patient denies any associated numbness, tingling, vision changes, dizziness, confusion.  Patient's symptoms are not worse when laying flat.   Past Medical History:  Diagnosis Date  . Cancer ([place]) 03/01/2018   rectal cancer=had chemo pills and radation   . Cataract   . Cervix cancer ([place]) 1979  . Depression   . Dyspnea   . Hypoglycemia   . Hyponatremia    syncope with this and admitted twice due to this issue   . Osteopenia   . Rectal bleeding   . Smoker     Patient Active Problem List   Diagnosis Date Noted  . 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 High output ileostomy ([place]) 07/21/2018  . Supplemental oxygen dependent 07/21/2018  . Hypokalemia 07/21/2018  . Hypomagnesemia 07/21/2018  . Hypoxemia 07/13/2018  . Benign neoplasm of transverse colon   . Benign neoplasm of descending colon   . Great toe pain, right 04/02/2018  . Rectal cancer s/p LAR/diverting loop ileostomy 07/12/2018 03/06/2018  . Osteopenia 12/15/2017  . Cervicogenic headache 09/08/2017  . Rectal mass 08/25/2017  . Bilateral knee pain 08/25/2017  . Health care maintenance 09/04/2015  . Tobacco abuse  09/04/2015    Past Surgical History:  Procedure Laterality Date  . ABDOMINAL HYSTERECTOMY    . APPENDECTOMY    . BIOPSY  07/04/2018   Procedure: BIOPSY;  Surgeon: Jerene Bears, MD;  Location: [facility];  Service: Gastroenterology;;  . CHOLECYSTECTOMY    . COLONOSCOPY  03/01/2018   Dr.Pyrtle  . COLONOSCOPY WITH PROPOFOL N/A 07/04/2018   Procedure: COLONOSCOPY WITH PROPOFOL;  Surgeon: Jerene Bears, MD;  Location: [facility];  Service: Gastroenterology;  Laterality: N/A;  . CYSTOSCOPY WITH STENT PLACEMENT Bilateral 07/12/2018   Procedure: CYSTOSCOPY WITH STENT PLACEMENT;  Surgeon: Lucas Mallow, MD;  Location: [facility];  Service: Urology;  Laterality: Bilateral;  . DIVERTING ILEOSTOMY N/A 07/12/2018   Procedure: DIVERTING LOOP ILEOSTOMY;  Surgeon: Ileana Roup, MD;  Location: [facility];  Service: General;  Laterality: N/A;  . EYE SURGERY    . FLEXIBLE SIGMOIDOSCOPY N/A 07/12/2018   Procedure: FLEXIBLE SIGMOIDOSCOPY;  Surgeon: Ileana Roup, MD;  Location: [facility];  Service: General;  Laterality: N/A;  . LAPAROSCOPIC LOW ANTERIOR RESECTION N/A 07/12/2018   Procedure: LAPAROSCOPIC LOW ANTERIOR RESECTION WITH COLOPROSTOSTOMY, ERAS PATHWAY;  Surgeon: Ileana Roup, MD;  Location: [facility];  Service: General;  Laterality: N/A;  . POLYPECTOMY  07/04/2018   Procedure: POLYPECTOMY;  Surgeon: Jerene Bears, MD;  Location: [facility];  Service: Gastroenterology;;  . SHOULDER SURGERY Left    Rotary Cuff Tear  . TUBAL LIGATION  OB History    Gravida  4   Para  3   Term  3   Preterm      AB  1   Living  3     SAB      TAB  1   Ectopic      Multiple      Live Births               Home Medications    Prior to Admission medications   Medication Sig Start Date End Date Taking? Authorizing Provider  acetaminophen (TYLENOL) 325 MG tablet Take 975 mg by mouth every 6 (six) hours as needed for moderate pain.    Yes [provider]  ferrous  sulfate 325 (65 FE) MG tablet Take 1 tablet (325 mg total) by mouth 2 (two) times daily with a meal. 07/22/18  Yes Hoxworth, Marland Kitchen, MD  loperamide (IMODIUM A-D) 2 MG tablet Take 1 tablet (2 mg total) by mouth 4 (four) times daily -  before meals and at bedtime. 07/20/18  Yes Ileana Roup, MD  Wheat Dextrin (BENEFIBER) TABS Take 1 tablet by mouth 2 (two) times daily. 07/20/18  Yes Ileana Roup, MD  capecitabine (XELODA) 500 MG tablet Take 3 tablets (1500mg ) by mouth in AM & 2 tabs (1000mg ) in PM, within 64min of finishing food. Take for 14 days on, 7 days off. 08/01/18   Ladell Pier, MD    Family History Family History  Problem Relation Age of Onset  . Stomach cancer Sister   . Ovarian cancer Sister   . Cancer - Ovarian Sister   . Dementia Mother   . Uterine cancer Mother   . Endometrial cancer Mother   . Prostate cancer Brother   . Cancer - Prostate Brother   . Cancer Other   . Cancer Brother        lung/brain  . Breast cancer Daughter   . Alzheimer's disease Father   . Colon cancer Neg Hx   . Esophageal cancer Neg Hx   . Pancreatic cancer Neg Hx   . Rectal cancer Neg Hx   . Colon polyps Neg Hx     Social History Social History   Tobacco Use  . Smoking status: Current Every Day Smoker    Packs/day: 1.50    Types: Cigarettes  . Smokeless tobacco: Never Used  Substance Use Topics  . Alcohol use: No    Alcohol/week: 0.0 standard drinks  . Drug use: No     Allergies   Aleve [naproxen sodium]   Review of Systems Review of Systems  Constitutional: Positive for activity change.  Gastrointestinal: Positive for abdominal pain.  Neurological: Positive for headaches.  Hematological: Does not bruise/bleed easily.  All other systems reviewed and are negative.    Physical Exam Updated Vital Signs BP 105/66 (BP Location: Right Arm)   Pulse 81   Temp (!) 97.3 F (36.3 C) (Oral)   Resp 18   Wt 50.8 kg   SpO2 100%   BMI 21.16 kg/m   Physical  Exam  Constitutional: She is oriented to person, place, and time. She appears well-developed and well-nourished.  HENT:  Head: Normocephalic and atraumatic.  Eyes: Pupils are equal, round, and reactive to light. EOM are normal.  Neck: Normal range of motion. Neck supple.  Cardiovascular: Normal rate, regular rhythm and normal heart sounds.  No murmur heard. Pulmonary/Chest: Effort normal. No respiratory distress.  Abdominal: Soft. Normal appearance and bowel sounds  are normal. She exhibits no distension. There is generalized tenderness. There is no rebound and no guarding.  Ostomy site appears to be clean  Neurological: She is alert and oriented to person, place, and time.  Cerebellar exam is normal (finger to nose) Sensory exam normal for bilateral upper and lower extremities - and patient is able to discriminate between sharp and dull. Motor exam is 4+/5   Skin: Skin is warm and dry.  Nursing note and vitals reviewed.    ED Treatments / Results  Labs (all labs ordered are listed, but only abnormal results are displayed) Labs Reviewed  BASIC METABOLIC PANEL - Abnormal; Notable for the following components:      Result Value   Glucose, Bld 105 (*)    All other components within normal limits  CBC WITH DIFFERENTIAL/PLATELET - Abnormal; Notable for the following components:   WBC 17.7 (*)    RBC 3.50 (*)    Hemoglobin 11.4 (*)    HCT 34.4 (*)    Platelets 672 (*)    Neutro Abs 16.1 (*)    Lymphs Abs 0.6 (*)    All other components within normal limits  URINALYSIS, ROUTINE W REFLEX MICROSCOPIC - Abnormal; Notable for the following components:   APPearance HAZY (*)    Hgb urine dipstick MODERATE (*)    All other components within normal limits    EKG None  Radiology No results found.  Procedures Procedures (including critical care time)  Medications Ordered in ED Medications  metoCLOPramide (REGLAN) injection 10 mg (10 mg Intravenous Given 07/27/18 1020)  ketorolac  (TORADOL) 15 MG/ML injection 15 mg (15 mg Intravenous Given 07/27/18 1218)  metoCLOPramide (REGLAN) injection 10 mg (10 mg Intravenous Given 07/27/18 1218)  iohexol (OMNIPAQUE) 300 MG/ML solution 30 mL (30 mLs Oral Contrast Given 07/27/18 1330)  iopamidol (ISOVUE-300) 61 % injection 100 mL (100 mLs Intravenous Contrast Given 07/27/18 1522)     Initial Impression / Assessment and Plan / ED Course  I have reviewed the triage vital signs and the nursing notes.  Pertinent labs & imaging results that were available during my care of the patient were reviewed by me and considered in my medical decision making (see chart for details).  Clinical Course as of Aug 03 1523  Thu Jul 27, 2018  1230 Upon reassessment, patient reports that the headache has resolved. She continued to have no neurologic complains. Strict return precautions discussed, pt will return to the ER if there is visual complains, seizures, altered mental status, loss of consciousness, dizziness, new focal weakness, or numbness.      [AN]    Clinical Course User Index [AN] Varney Biles, MD    67 year old female comes in with chief complaint of abdominal discomfort.  She is also having headaches.  She has history of colon cancer and status post recent colorectal surgery and has ostomy bag in place.  Neuro exam is nonfocal, CT head ordered to ensure there is no mets and it is negative.  CT scan of the abdomen also ordered because of the recent surgery and white count of 17.7.  There is no UTI-like symptoms.  CT scan does not show any clear concerning etiology.  Surgical team proactively came and saw the patient.  They reviewed the CT scan did not find anything concerning.  P.o. challenge has been initiated.  Patient's care to be transferred to Dr. Alvino Chapel, who will get further recommendation from the surgical team.  Anticipate discharge with close follow-up.  Final  Clinical Impressions(s) / ED Diagnoses   Final diagnoses:    Abdominal pain, unspecified abdominal location  Leukocytosis, unspecified type    ED Discharge Orders    None       Varney Biles, MD 08/02/18 [phone]

## 2018-08-03 NOTE — Telephone Encounter (Signed)
Oral Oncology Patient Advocate Encounter  Confirmed with [facility] that Xeloda was picked up on 08/03/18   [facility] Patient [place] Phone [phone] Fax [phone]

## 2018-08-07 DIAGNOSIS — K435 Parastomal hernia without obstruction or  gangrene: Secondary | ICD-10-CM | POA: Diagnosis not present

## 2018-08-07 DIAGNOSIS — Z9049 Acquired absence of other specified parts of digestive tract: Secondary | ICD-10-CM | POA: Diagnosis not present

## 2018-08-07 DIAGNOSIS — Z432 Encounter for attention to ileostomy: Secondary | ICD-10-CM | POA: Diagnosis not present

## 2018-08-07 DIAGNOSIS — Z483 Aftercare following surgery for neoplasm: Secondary | ICD-10-CM | POA: Diagnosis not present

## 2018-08-07 DIAGNOSIS — C20 Malignant neoplasm of rectum: Secondary | ICD-10-CM | POA: Diagnosis not present

## 2018-08-09 ENCOUNTER — Ambulatory Visit (INDEPENDENT_AMBULATORY_CARE_PROVIDER_SITE_OTHER): Payer: Medicare Other | Admitting: Internal Medicine

## 2018-08-09 ENCOUNTER — Other Ambulatory Visit: Payer: Self-pay

## 2018-08-09 ENCOUNTER — Encounter: Payer: Self-pay | Admitting: Internal Medicine

## 2018-08-09 VITALS — BP 113/72 | HR 96 | Temp 97.7°F | Ht 61.0 in | Wt 106.1 lb

## 2018-08-09 DIAGNOSIS — M62830 Muscle spasm of back: Secondary | ICD-10-CM | POA: Diagnosis not present

## 2018-08-09 DIAGNOSIS — Z791 Long term (current) use of non-steroidal anti-inflammatories (NSAID): Secondary | ICD-10-CM

## 2018-08-09 DIAGNOSIS — Z79899 Other long term (current) drug therapy: Secondary | ICD-10-CM

## 2018-08-09 DIAGNOSIS — Z923 Personal history of irradiation: Secondary | ICD-10-CM

## 2018-08-09 DIAGNOSIS — Z932 Ileostomy status: Secondary | ICD-10-CM

## 2018-08-09 DIAGNOSIS — M545 Low back pain, unspecified: Secondary | ICD-10-CM

## 2018-08-09 DIAGNOSIS — Z9221 Personal history of antineoplastic chemotherapy: Secondary | ICD-10-CM | POA: Diagnosis not present

## 2018-08-09 DIAGNOSIS — Z85038 Personal history of other malignant neoplasm of large intestine: Secondary | ICD-10-CM | POA: Diagnosis not present

## 2018-08-09 MED ORDER — CYCLOBENZAPRINE HCL 10 MG PO TABS: 10.0000 mg | ORAL_TABLET | Freq: Three times a day (TID) | ORAL | 0 refills | Status: DC | PRN

## 2018-08-09 NOTE — Progress Notes (Signed)
   CC: back pain  HPI:  Deborah Arroyo is a 67 y.o. female with PMHx of colon cancer s/p neoadjuvant chemo/radiation, low anterior ileostomy on 9/11 and currently on adjuvant Xeloda. She presents with 4 weeks of right sided low back pain. She has a history of chronic back pain that she notes has gotten worse since surgery. She is not able to lie in her bed due to pain and has been sleeping in her recliner. The pain is described as a constant squeezing sensation. It is relieved if she sits up straight and applies pressure to the area. She has not tried any medications for the pain. Denies fevers, chills, radiation into her legs, numbness/tingling, saddles anesthesia, bladder incontinence.    Past Medical History:  Diagnosis Date  . Cancer ([place]) 03/01/2018   rectal cancer=had chemo pills and radation   . Cataract   . Cervix cancer ([place]) 1979  . Depression   . Dyspnea   . Hypoglycemia   . Hyponatremia    syncope with this and admitted twice due to this issue   . Osteopenia   . Rectal bleeding   . Smoker    Review of Systems:   General: no weight loss  CV: no chest pain, palpitations  Abd: no increased output from ileostomy bag or abdominal pain    Physical Exam:  Vitals:   08/09/18 1319  BP: 113/72  Pulse: 96  Temp: 97.7 F (36.5 C)  TempSrc: Oral  SpO2: 100%  Weight: 106 lb 1.6 oz (48.1 kg)  Height: 5\' 1"  (1.549 m)   General: alert, pleasant female, appears stated age in NAD CV: RRR; no murmurs, rubs or gallops Pulm: normal respiratory effort; lungs CTA bilaterally Abd: BS+; right ileostomy with dark liquid stool; abdomen is soft; LLQ with fibrotic changes at surgical site that is TTP  Ext: no swelling MSK: TTP of right thoracolumbar fascia and external oblique muscles. Decreased ROM with flexion and left side-bending secondary to pain.  Assessment & Plan:   See Encounters Tab for problem based charting.  Patient seen with Dr. Eppie Gibson

## 2018-08-09 NOTE — Patient Instructions (Addendum)
[name], It was a pleasure meeting you! We are treating you for muscle spasms in your back. I am prescribing a muscle relaxant for you to take as needed. I'd also like you to try taking 400 mg Ibuprofen three times a day for the next 2 weeks.  You can also use heating pads as needed to help loosen up the muscles.  The muscle relaxant can make you sleepy so please do not drive whenever you take this.  Please call if you develop fevers, chills, pain going into your legs, or weakness. We will see you back for your next visit with your PCP.   Blood pressure: 120/80-140/90 Pulse: 60-99 Respirations/minute 12-18 Fasting blood sugar: less than 140

## 2018-08-10 ENCOUNTER — Encounter: Payer: Self-pay | Admitting: Internal Medicine

## 2018-08-10 DIAGNOSIS — M545 Low back pain, unspecified: Secondary | ICD-10-CM | POA: Insufficient documentation

## 2018-08-10 NOTE — Assessment & Plan Note (Signed)
Exam most consistent with MSK etiology. Given that it has worsened since surgery, may be partially related to posterior compensation from having ileostomy bag. Denies red flag symptoms. Will prescribe short course of Flexeril to use as needed. Also instructed her to take Ibuprofen 400 mg TID for the next 2 weeks to treat acute inflammation. Advised patient not to drive when taking Flexeril.

## 2018-08-11 NOTE — Addendum Note (Signed)
Addended by: Oval Linsey D on: 08/11/2018 05:21 PM   Modules accepted: Level of Service

## 2018-08-11 NOTE — Progress Notes (Signed)
Patient ID: Deborah Arroyo, female   DOB: [birth year], 67 y.o.   MRN: [id number]  I saw and evaluated the patient.  I personally confirmed the key portions of Dr. Janne Napoleon history and exam and reviewed pertinent patient test results.  The assessment, diagnosis, and plan were formulated together and I agree with the documentation in the resident's note.

## 2018-08-14 DIAGNOSIS — K435 Parastomal hernia without obstruction or  gangrene: Secondary | ICD-10-CM | POA: Diagnosis not present

## 2018-08-14 DIAGNOSIS — Z9049 Acquired absence of other specified parts of digestive tract: Secondary | ICD-10-CM | POA: Diagnosis not present

## 2018-08-14 DIAGNOSIS — Z432 Encounter for attention to ileostomy: Secondary | ICD-10-CM | POA: Diagnosis not present

## 2018-08-14 DIAGNOSIS — C20 Malignant neoplasm of rectum: Secondary | ICD-10-CM | POA: Diagnosis not present

## 2018-08-14 DIAGNOSIS — Z483 Aftercare following surgery for neoplasm: Secondary | ICD-10-CM | POA: Diagnosis not present

## 2018-08-21 ENCOUNTER — Other Ambulatory Visit: Payer: Self-pay | Admitting: Oncology

## 2018-08-21 ENCOUNTER — Telehealth: Payer: Self-pay | Admitting: *Deleted

## 2018-08-21 DIAGNOSIS — Z483 Aftercare following surgery for neoplasm: Secondary | ICD-10-CM | POA: Diagnosis not present

## 2018-08-21 DIAGNOSIS — Z432 Encounter for attention to ileostomy: Secondary | ICD-10-CM | POA: Diagnosis not present

## 2018-08-21 DIAGNOSIS — Z9049 Acquired absence of other specified parts of digestive tract: Secondary | ICD-10-CM | POA: Diagnosis not present

## 2018-08-21 DIAGNOSIS — C20 Malignant neoplasm of rectum: Secondary | ICD-10-CM | POA: Diagnosis not present

## 2018-08-21 DIAGNOSIS — K435 Parastomal hernia without obstruction or  gangrene: Secondary | ICD-10-CM | POA: Diagnosis not present

## 2018-08-21 NOTE — Telephone Encounter (Signed)
Morey Hummingbird, RN with [facility] wanted to give Dr Benay Spice an update after her visit today with Deborah Arroyo.  "Pt c/o mid thoracic back pain extending to lumbar area, c/o bilateral lower leg pain and muscle weakness. Is having dizziness. Is taking in fluids without problems and is taking oral chemo. Has an appt with Dr Benay Spice this week"   BP sitting 102/74, pulse 110 BP standing 86/62, pulse 120

## 2018-08-23 ENCOUNTER — Ambulatory Visit: Payer: Medicare Other

## 2018-08-23 ENCOUNTER — Inpatient Hospital Stay: Payer: Medicare Other

## 2018-08-23 ENCOUNTER — Telehealth: Payer: Self-pay | Admitting: Pharmacist

## 2018-08-23 ENCOUNTER — Inpatient Hospital Stay: Payer: Medicare Other | Attending: Oncology | Admitting: Oncology

## 2018-08-23 VITALS — BP 123/82 | HR 90 | Temp 97.9°F | Resp 18 | Ht 61.0 in | Wt 102.7 lb

## 2018-08-23 DIAGNOSIS — Z932 Ileostomy status: Secondary | ICD-10-CM | POA: Insufficient documentation

## 2018-08-23 DIAGNOSIS — J449 Chronic obstructive pulmonary disease, unspecified: Secondary | ICD-10-CM

## 2018-08-23 DIAGNOSIS — Z809 Family history of malignant neoplasm, unspecified: Secondary | ICD-10-CM | POA: Diagnosis not present

## 2018-08-23 DIAGNOSIS — C20 Malignant neoplasm of rectum: Secondary | ICD-10-CM

## 2018-08-23 DIAGNOSIS — F17210 Nicotine dependence, cigarettes, uncomplicated: Secondary | ICD-10-CM | POA: Diagnosis not present

## 2018-08-23 LAB — CMP (CANCER CENTER ONLY)
ALT: 17 U/L (ref 0–44)
AST: 18 U/L (ref 15–41)
Albumin: 3.8 g/dL (ref 3.5–5.0)
Alkaline Phosphatase: 115 U/L (ref 38–126)
Anion gap: 11 (ref 5–15)
BUN: 30 mg/dL — ABNORMAL HIGH (ref 8–23)
CHLORIDE: 104 mmol/L (ref 98–111)
CO2: 18 mmol/L — ABNORMAL LOW (ref 22–32)
Calcium: 9.8 mg/dL (ref 8.9–10.3)
Creatinine: 1.18 mg/dL — ABNORMAL HIGH (ref 0.44–1.00)
GFR, EST AFRICAN AMERICAN: 54 mL/min — AB (ref >60)
GFR, Estimated: 46 mL/min — ABNORMAL LOW (ref >60)
Glucose, Bld: 79 mg/dL (ref 70–99)
POTASSIUM: 4.7 mmol/L (ref 3.5–5.1)
Sodium: 133 mmol/L — ABNORMAL LOW (ref 135–145)
TOTAL PROTEIN: 7.6 g/dL (ref 6.5–8.1)

## 2018-08-23 LAB — CBC WITH DIFFERENTIAL (CANCER CENTER ONLY)
Abs Immature Granulocytes: 0.02 10*3/uL (ref 0.00–0.07)
BASOS ABS: 0 10*3/uL (ref 0.0–0.1)
BASOS PCT: 0 %
EOS PCT: 2 %
Eosinophils Absolute: 0.1 10*3/uL (ref 0.0–0.5)
HCT: 40.1 % (ref 36.0–46.0)
Hemoglobin: 13.7 g/dL (ref 12.0–15.0)
Immature Granulocytes: 0 %
LYMPHS PCT: 14 %
Lymphs Abs: 0.9 10*3/uL (ref 0.7–4.0)
MCH: 31.6 pg (ref 26.0–34.0)
MCHC: 34.2 g/dL (ref 30.0–36.0)
MCV: 92.6 fL (ref 80.0–100.0)
MONO ABS: 0.7 10*3/uL (ref 0.1–1.0)
Monocytes Relative: 10 %
NEUTROS ABS: 5 10*3/uL (ref 1.7–7.7)
NRBC: 0 % (ref 0.0–0.2)
Neutrophils Relative %: 74 %
Platelet Count: 315 10*3/uL (ref 150–400)
RBC: 4.33 MIL/uL (ref 3.87–5.11)
RDW: 13.2 % (ref 11.5–15.5)
WBC: 6.8 10*3/uL (ref 4.0–10.5)

## 2018-08-23 LAB — MAGNESIUM: MAGNESIUM: 1.9 mg/dL (ref 1.7–2.4)

## 2018-08-23 MED FILL — CAPECITABINE 500 MG TABS: 500 | 21 days supply | Qty: 70 | Fill #0

## 2018-08-23 NOTE — Telephone Encounter (Signed)
Oral Chemotherapy Pharmacist Encounter   Spoke with patient today to follow up regarding patient's oral chemotherapy medication: Xeloda (capecitabine) for the adjuvanttreatment of rectal cancer, planned duration5 more cycles of therapy post radiation treatment.   Patient received neoadjuvant Xeloda plus radiation 04/03/2018-05/11/2018 Dose of Xeloda at that time was 1500mg  in AM and 1000mg  in PM on days of radiation only  Adjuvant treatment with Xeloda monotherapy began on 08/07/18 with Xeloda 500mg  tablets, 3 tablets (1500mg ) by mouth in AM and 2 tabs (1000mg ) by mouth in PM, within 30 minutes of finishing meals, on days 1-14 of each 21 day cycle.  Pt reports 0 tablets missed in the last month.   Pt reports the following side effects: none to report.  Pertinent labs reviewed: OK for continued treatment.  Other Issues:  Patient with multiple questions about continued medication acquisition.   Patient states that her Medicaid is no longer paying for her Xeloda tablets and she will not be able to afford her medication past this cycle which she is picking up today and will start on 08/28/18. Patient states that the last fill that she got of the Xeloda from the [facility] carried a co-pay of $190, all of which was covered by to [place]. Patient states there are only enough funds left to cover this fill and she will not be able to afford that high co-pay for any remaining cycles of her Xeloda.  Oral Oncology Clinic will research patient's copayment and [facility] funds remaining and follow-up with patient.  Patient knows to call the office with questions or concerns. Oral Oncology Clinic will continue to follow.  Johny Drilling, PharmD, BCPS, BCOP  08/23/2018 1:15 PM Oral Oncology Clinic [phone]

## 2018-08-23 NOTE — Progress Notes (Signed)
  [facility] OFFICE PROGRESS NOTE   Diagnosis: Rectal cancer  INTERVAL HISTORY:   Deborah Arroyo returns as scheduled.  She completed a cycle of adjuvant Xeloda beginning 08/07/2018.  No mouth sores or hand/foot pain.  She has loose stool in the ileostomy.  She reports having up to 650 cc/day of stool output.  She takes Imodium 3 times per day and fiber.  She does not feel the Imodium has made a difference.  She empties the ileostomy bag 4-5 times per day.  Objective:  Vital signs in last 24 hours:  Blood pressure 123/82, pulse 90, temperature 97.9 F (36.6 C), temperature source Oral, resp. rate 18, height '5\' 1"'$  (1.549 m), weight 102 lb 11.2 oz (46.6 kg).    HEENT: No thrush or ulcers, the mucous membranes are moist Resp: Lungs clear bilaterally Cardio: Regular rate and rhythm GI: No hepatomegaly, nontender, right mid abdomen ileostomy with brown liquid stool Vascular: No leg edema  Skin: Palms without erythema   Lab Results:  Lab Results  Component Value Date   WBC 6.8 08/23/2018   HGB 13.7 08/23/2018   HCT 40.1 08/23/2018   MCV 92.6 08/23/2018   PLT 315 08/23/2018   NEUTROABS 5.0 08/23/2018    CMP  Lab Results  Component Value Date   NA 133 (L) 08/23/2018   K 4.7 08/23/2018   CL 104 08/23/2018   CO2 18 (L) 08/23/2018   GLUCOSE 79 08/23/2018   BUN 30 (H) 08/23/2018   CREATININE 1.18 (H) 08/23/2018   CALCIUM 9.8 08/23/2018   PROT 7.6 08/23/2018   ALBUMIN 3.8 08/23/2018   AST 18 08/23/2018   ALT 17 08/23/2018   ALKPHOS 115 08/23/2018   BILITOT <0.2 (L) 08/23/2018   GFRNONAA 46 (L) 08/23/2018   GFRAA 54 (L) 08/23/2018    Lab Results  Component Value Date   CEA1 4.2 06/28/2018     Medications: I have reviewed the patient's current medications.   Assessment/Plan: 1. Rectal cancer, obstructing rectal mass on colonoscopy 03/01/2018-biopsy confirmed adenocarcinoma ? Incomplete colonoscopy secondary to obstructing tumor ? Staging CTs on  03/03/2018-small mediastinal/hilar lymph nodes, indeterminate lingula nodule, rectal mass, small perirectal lymph nodes, mass measured at approximately 4.9 cm from the anal verge ? MRI pelvis 03/11/2018-T3c,N2 ? Radiation/Xeloda 04/03/2018-05/11/2018 ? Colonoscopy 07/04/2018- mass at 8 cm from the dentate line, polyps removed from the hepatic flexure and descending colon-tubular adenomas ? Low anterior resection/ileostomy 07/12/2018, T3N0 moderately differentiated adenocarcinoma, margins negative, 11- lymph nodes, no lymphovascular or perineural invasion, MSI-stable, no loss of mismatch repair protein expression ? Cycle 1 adjuvant Xeloda 08/07/2018 ? Cycle 2 adjuvant Xeloda 08/28/2018 2. Tobacco use 3. COPD 4. Family history of multiple cancers    Disposition: Deborah Arroyo rated the first cycle of adjuvant Xeloda well.  She will complete cycle 2 beginning 08/28/2018.  She declines an influenza vaccine.  She appears to be tolerating the ileostomy well.  I recommended she increase oral hydration as tolerated.  She will continue Imodium and fiber.  The BUN and creatinine are higher today compared to when she was here last month.  We will IV fluids today.  She will return for an office and lab visit in approximately 3 weeks.  Deborah Coder, MD  08/23/2018  12:20 PM

## 2018-08-24 ENCOUNTER — Telehealth: Payer: Self-pay | Admitting: Oncology

## 2018-08-24 DIAGNOSIS — D123 Benign neoplasm of transverse colon: Secondary | ICD-10-CM | POA: Diagnosis not present

## 2018-08-24 DIAGNOSIS — D124 Benign neoplasm of descending colon: Secondary | ICD-10-CM | POA: Diagnosis not present

## 2018-08-24 NOTE — Telephone Encounter (Signed)
Scheduled appt per 10/23 los - pt is aware of appt date and time

## 2018-08-25 ENCOUNTER — Telehealth: Payer: Self-pay

## 2018-08-25 NOTE — Telephone Encounter (Signed)
Oral Oncology Patient Advocate Encounter  Deborah Arroyo had questions about her copay and how much her insurance was paying versus how much the Deborah Arroyo was covering. I did some investigation into the receipt she received from the pharmacy, the copay that the insurance kicked back to the pharmacy and how much money was left in the Deborah Arroyo fund.   On her receipt it said $206.05. This was $164.84 that her insurance paid, and $41.21 that was her copay added together. The Deborah Arroyo covered the $41.21 and the patient paid $0. She currently has $147.89 in her Deborah Arroyo and will get her through the next 3 cycles she will need.  I called the patient and broke all of this down for her. She verbalized understanding and great appreciation. This seemed to put her at ease about her concerns with being able to continue her medicine.  [place] Patient [place] Phone [phone] Fax [phone]

## 2018-08-26 NOTE — Telephone Encounter (Signed)
thanks

## 2018-08-28 DIAGNOSIS — Z9049 Acquired absence of other specified parts of digestive tract: Secondary | ICD-10-CM | POA: Diagnosis not present

## 2018-08-28 DIAGNOSIS — K435 Parastomal hernia without obstruction or  gangrene: Secondary | ICD-10-CM | POA: Diagnosis not present

## 2018-08-28 DIAGNOSIS — Z483 Aftercare following surgery for neoplasm: Secondary | ICD-10-CM | POA: Diagnosis not present

## 2018-08-28 DIAGNOSIS — C20 Malignant neoplasm of rectum: Secondary | ICD-10-CM | POA: Diagnosis not present

## 2018-08-28 DIAGNOSIS — Z432 Encounter for attention to ileostomy: Secondary | ICD-10-CM | POA: Diagnosis not present

## 2018-08-29 ENCOUNTER — Telehealth: Payer: Self-pay

## 2018-08-29 NOTE — Telephone Encounter (Signed)
Nutrition Assessment   Reason for Assessment:   Patient identified on Malnutrition screening report for poor appetite and weight loss.     ASSESSMENT:  67 year old female with rectal cancer.  Patient s/p ileostomy on 07/12/18. Patient currently on xeloda.  Past medical history of COPD    Patient returned RDs call.  Introduce self and service at the cancer center.  Patient reports that she does not have much of an appetite.  "I have to force myself to eat."  "I have never been a big eater."  Reports that often times she just eats one meal per day.  Nothing eaten so far today. Yesterday ate 2 eggs with cheese and 2 pieces of toast with butter and jelly.  Day before yesterday ate hamburger, cream cheese and tomato mixture with potato chips and 1/2 peanut butter sandwich.  Drinks water, juice and gatorade (when she can afford it), coffee.    Reports ileostomy output is mostly around 400-575 cc daily.  Highest it has been is 830ml.     Medications: reviewed   Labs: reviewed   Anthropometrics:   Weight 102 lb 11.2 oz on 10/23 noted.   UBW 112-114 lb per patient Last at that weight on 9/21 112 lb 9% weight loss in the last month.     NUTRITION DIAGNOSIS: Inadequate oral nutrition related to cancer and cancer related side effects as evidenced by 9% weight loss in the last month and eating 1 meal per day   INTERVENTION:  Discussed importance of good nutrition via phone. Encouraged patient to consume small frequent snacks during the day. Discussed average output of ileostomy is 500-1371ml daily.  Discussed foods to eat to help thicken stool.   Discussed importance of hydration. Encouraged high calorie, high protein foods to help prevent weight from going down.   Patient declined nutrition visit at this time.  Gave patient RDs contact information and encouraged her to contact us in future with questions or nutrition concerns.    MONITORING, EVALUATION, GOAL: Patient will consume  adequate calories and protein to prevent further weight loss   Next Visit: as needed, patient to contact RD  Lillianna Sabel B. Zenia Resides, [place], [place] Registered Dietitian [phone] (pager)

## 2018-08-29 NOTE — Telephone Encounter (Signed)
Nutrition  Patient identified on Malnutrition screening report for weight loss and poor appetite.  Chart reviewed.  Called and left message on home voicemail. Asked for patient to call back.    Xerxes Agrusa B. Zenia Resides, [place], [place] Registered Dietitian [phone] (pager)

## 2018-09-04 DIAGNOSIS — C20 Malignant neoplasm of rectum: Secondary | ICD-10-CM | POA: Diagnosis not present

## 2018-09-04 DIAGNOSIS — K435 Parastomal hernia without obstruction or  gangrene: Secondary | ICD-10-CM | POA: Diagnosis not present

## 2018-09-04 DIAGNOSIS — Z432 Encounter for attention to ileostomy: Secondary | ICD-10-CM | POA: Diagnosis not present

## 2018-09-04 DIAGNOSIS — Z483 Aftercare following surgery for neoplasm: Secondary | ICD-10-CM | POA: Diagnosis not present

## 2018-09-04 DIAGNOSIS — Z9049 Acquired absence of other specified parts of digestive tract: Secondary | ICD-10-CM | POA: Diagnosis not present

## 2018-09-08 ENCOUNTER — Other Ambulatory Visit: Payer: Self-pay | Admitting: Oncology

## 2018-09-08 DIAGNOSIS — C20 Malignant neoplasm of rectum: Secondary | ICD-10-CM

## 2018-09-13 ENCOUNTER — Encounter: Payer: Self-pay | Admitting: Nurse Practitioner

## 2018-09-13 ENCOUNTER — Telehealth: Payer: Self-pay | Admitting: Oncology

## 2018-09-13 ENCOUNTER — Inpatient Hospital Stay: Payer: Medicare Other | Attending: Oncology | Admitting: Nurse Practitioner

## 2018-09-13 ENCOUNTER — Inpatient Hospital Stay: Payer: Medicare Other

## 2018-09-13 VITALS — BP 101/72 | HR 112 | Resp 17 | Ht 61.0 in | Wt 101.3 lb

## 2018-09-13 DIAGNOSIS — C20 Malignant neoplasm of rectum: Secondary | ICD-10-CM | POA: Diagnosis not present

## 2018-09-13 DIAGNOSIS — Z923 Personal history of irradiation: Secondary | ICD-10-CM | POA: Insufficient documentation

## 2018-09-13 DIAGNOSIS — J449 Chronic obstructive pulmonary disease, unspecified: Secondary | ICD-10-CM | POA: Diagnosis not present

## 2018-09-13 DIAGNOSIS — Z720 Tobacco use: Secondary | ICD-10-CM | POA: Insufficient documentation

## 2018-09-13 DIAGNOSIS — Z79899 Other long term (current) drug therapy: Secondary | ICD-10-CM | POA: Diagnosis not present

## 2018-09-13 DIAGNOSIS — Z9221 Personal history of antineoplastic chemotherapy: Secondary | ICD-10-CM | POA: Insufficient documentation

## 2018-09-13 LAB — CBC WITH DIFFERENTIAL (CANCER CENTER ONLY)
Abs Immature Granulocytes: 0.03 10*3/uL (ref 0.00–0.07)
BASOS PCT: 1 %
Basophils Absolute: 0.1 10*3/uL (ref 0.0–0.1)
EOS PCT: 1 %
Eosinophils Absolute: 0.1 10*3/uL (ref 0.0–0.5)
HCT: 44.2 % (ref 36.0–46.0)
Hemoglobin: 15 g/dL (ref 12.0–15.0)
Immature Granulocytes: 0 %
Lymphocytes Relative: 12 %
Lymphs Abs: 0.9 10*3/uL (ref 0.7–4.0)
MCH: 31.4 pg (ref 26.0–34.0)
MCHC: 33.9 g/dL (ref 30.0–36.0)
MCV: 92.7 fL (ref 80.0–100.0)
MONO ABS: 0.7 10*3/uL (ref 0.1–1.0)
Monocytes Relative: 9 %
Neutro Abs: 5.8 10*3/uL (ref 1.7–7.7)
Neutrophils Relative %: 77 %
Platelet Count: 313 10*3/uL (ref 150–400)
RBC: 4.77 MIL/uL (ref 3.87–5.11)
RDW: 15 % (ref 11.5–15.5)
WBC: 7.6 10*3/uL (ref 4.0–10.5)
nRBC: 0 % (ref 0.0–0.2)

## 2018-09-13 LAB — CMP (CANCER CENTER ONLY)
ALT: 21 U/L (ref 0–44)
AST: 26 U/L (ref 15–41)
Albumin: 4.2 g/dL (ref 3.5–5.0)
Alkaline Phosphatase: 146 U/L — ABNORMAL HIGH (ref 38–126)
Anion gap: 11 (ref 5–15)
BUN: 21 mg/dL (ref 8–23)
CO2: 20 mmol/L — ABNORMAL LOW (ref 22–32)
CREATININE: 1.14 mg/dL — AB (ref 0.44–1.00)
Calcium: 10.5 mg/dL — ABNORMAL HIGH (ref 8.9–10.3)
Chloride: 104 mmol/L (ref 98–111)
GFR, EST NON AFRICAN AMERICAN: 48 mL/min — AB (ref >60)
GFR, Est AFR Am: 56 mL/min — ABNORMAL LOW (ref >60)
Glucose, Bld: 120 mg/dL — ABNORMAL HIGH (ref 70–99)
POTASSIUM: 4.7 mmol/L (ref 3.5–5.1)
Sodium: 135 mmol/L (ref 135–145)
Total Bilirubin: 0.5 mg/dL (ref 0.3–1.2)
Total Protein: 8.2 g/dL — ABNORMAL HIGH (ref 6.5–8.1)

## 2018-09-13 MED ORDER — CAPECITABINE 500 MG PO TABS: ORAL_TABLET | ORAL | 0 refills | Status: DC

## 2018-09-13 MED FILL — CAPECITABINE 500 MG TABS: 500 | 21 days supply | Qty: 70 | Fill #0

## 2018-09-13 NOTE — Telephone Encounter (Signed)
Gave patient avs report and appointments for December  °

## 2018-09-13 NOTE — Progress Notes (Signed)
  [facility] OFFICE PROGRESS NOTE   Diagnosis: Rectal cancer  INTERVAL HISTORY:   Deborah Arroyo returns as scheduled.  She completed cycle 2 adjuvant Xeloda beginning 08/28/2018.  She denies nausea/vomiting.  No mouth sores.  No change in baseline output from the ostomy.  She estimates emptying the bag 6-7 times a day.  Output tends to be very watery.  She has discontinued Imodium.  No hand or foot pain or redness.  Objective:  Vital signs in last 24 hours:  Blood pressure 101/72, pulse (!) 112, resp. rate 17, height _0  (1.549 m), weight 101 lb 4.8 oz (45.9 kg), SpO2 97 %.    HEENT: No thrush or ulcers. Resp: Lungs clear bilaterally. Cardio: Regular rate and rhythm. GI: Abdomen soft and nontender.  No hepatomegaly.  Right abdomen ileostomy with watery output. Vascular: No leg edema.  Skin: Palms without erythema.   Lab Results:  Lab Results  Component Value Date   WBC 7.6 09/13/2018   HGB 15.0 09/13/2018   HCT 44.2 09/13/2018   MCV 92.7 09/13/2018   PLT 313 09/13/2018   NEUTROABS 5.8 09/13/2018    Imaging:  No results found.  Medications: I have reviewed the patient's current medications.  Assessment/Plan: 1. Rectal cancer, obstructing rectal mass on colonoscopy 03/01/2018-biopsy confirmed adenocarcinoma ? Incomplete colonoscopy secondary to obstructing tumor ? Staging CTs on 03/03/2018-small mediastinal/hilar lymph nodes, indeterminate lingula nodule, rectal mass, small perirectal lymph nodes, mass measured at approximately 4.9 cm from the anal verge ? MRI pelvis 03/11/2018-T3c,N2 ? Radiation/Xeloda 04/03/2018-05/11/2018 ? Colonoscopy 07/04/2018- mass at 8 cm from the dentate line, polypsremoved from the hepatic flexure and descending colon-tubular adenomas ? Low anterior resection/ileostomy 07/12/2018, T3N0 moderately differentiated adenocarcinoma, margins negative, 11- lymph nodes, no lymphovascular or perineural invasion, MSI-stable, no loss of mismatch  repair protein expression ? Cycle 1 adjuvant Xeloda 08/07/2018 ? Cycle 2 adjuvant Xeloda 08/28/2018 ? Cycle 3 adjuvant Xeloda 09/18/2018 2. Tobacco use 3. COPD 4. Family history of multiple cancers  Disposition: Deborah Arroyo appears stable.  She has completed 2 cycles of adjuvant Xeloda.  Plan to proceed with cycle 3 as scheduled beginning 09/18/2018.  We discussed the diarrhea.  She is likely mildly dehydrated.  She declines IV fluids.  She will increase oral intake and resume Imodium.  She will contact the office with persistent diarrhea.  She will return for lab and follow-up in 3 weeks. She will contact the office in the interim as outlined above or with any other problems.    Ned Card ANP/GNP-BC   09/13/2018  2:46 PM

## 2018-09-14 DIAGNOSIS — Z483 Aftercare following surgery for neoplasm: Secondary | ICD-10-CM | POA: Diagnosis not present

## 2018-09-14 DIAGNOSIS — C20 Malignant neoplasm of rectum: Secondary | ICD-10-CM | POA: Diagnosis not present

## 2018-09-14 DIAGNOSIS — K435 Parastomal hernia without obstruction or  gangrene: Secondary | ICD-10-CM | POA: Diagnosis not present

## 2018-09-14 DIAGNOSIS — Z9049 Acquired absence of other specified parts of digestive tract: Secondary | ICD-10-CM | POA: Diagnosis not present

## 2018-09-14 DIAGNOSIS — Z432 Encounter for attention to ileostomy: Secondary | ICD-10-CM | POA: Diagnosis not present

## 2018-09-18 ENCOUNTER — Other Ambulatory Visit: Payer: Self-pay | Admitting: Internal Medicine

## 2018-09-18 DIAGNOSIS — M545 Low back pain, unspecified: Secondary | ICD-10-CM

## 2018-10-04 ENCOUNTER — Other Ambulatory Visit: Payer: Self-pay | Admitting: Oncology

## 2018-10-04 DIAGNOSIS — C20 Malignant neoplasm of rectum: Secondary | ICD-10-CM

## 2018-10-06 ENCOUNTER — Inpatient Hospital Stay: Payer: Medicare Other

## 2018-10-06 ENCOUNTER — Inpatient Hospital Stay: Payer: Medicare Other | Attending: Oncology | Admitting: Oncology

## 2018-10-06 ENCOUNTER — Telehealth: Payer: Self-pay | Admitting: Oncology

## 2018-10-06 VITALS — BP 101/77 | HR 98 | Temp 97.0°F | Resp 18 | Ht 61.0 in | Wt 100.3 lb

## 2018-10-06 VITALS — BP 88/60 | HR 82 | Resp 18

## 2018-10-06 DIAGNOSIS — C20 Malignant neoplasm of rectum: Secondary | ICD-10-CM | POA: Insufficient documentation

## 2018-10-06 DIAGNOSIS — Z923 Personal history of irradiation: Secondary | ICD-10-CM | POA: Diagnosis not present

## 2018-10-06 DIAGNOSIS — Z9221 Personal history of antineoplastic chemotherapy: Secondary | ICD-10-CM | POA: Diagnosis not present

## 2018-10-06 DIAGNOSIS — R197 Diarrhea, unspecified: Secondary | ICD-10-CM | POA: Diagnosis not present

## 2018-10-06 DIAGNOSIS — Z79899 Other long term (current) drug therapy: Secondary | ICD-10-CM | POA: Insufficient documentation

## 2018-10-06 DIAGNOSIS — E860 Dehydration: Secondary | ICD-10-CM | POA: Insufficient documentation

## 2018-10-06 DIAGNOSIS — L271 Localized skin eruption due to drugs and medicaments taken internally: Secondary | ICD-10-CM | POA: Diagnosis not present

## 2018-10-06 DIAGNOSIS — Z932 Ileostomy status: Secondary | ICD-10-CM | POA: Diagnosis not present

## 2018-10-06 LAB — CMP (CANCER CENTER ONLY)
ALBUMIN: 4.1 g/dL (ref 3.5–5.0)
ALT: 26 U/L (ref 0–44)
AST: 31 U/L (ref 15–41)
Alkaline Phosphatase: 149 U/L — ABNORMAL HIGH (ref 38–126)
Anion gap: 13 (ref 5–15)
BILIRUBIN TOTAL: 0.4 mg/dL (ref 0.3–1.2)
BUN: 23 mg/dL (ref 8–23)
CHLORIDE: 102 mmol/L (ref 98–111)
CO2: 20 mmol/L — ABNORMAL LOW (ref 22–32)
Calcium: 9.9 mg/dL (ref 8.9–10.3)
Creatinine: 1.37 mg/dL — ABNORMAL HIGH (ref 0.44–1.00)
GFR, EST NON AFRICAN AMERICAN: 40 mL/min — AB (ref >60)
GFR, Est AFR Am: 46 mL/min — ABNORMAL LOW (ref >60)
Glucose, Bld: 93 mg/dL (ref 70–99)
POTASSIUM: 4 mmol/L (ref 3.5–5.1)
Sodium: 135 mmol/L (ref 135–145)
TOTAL PROTEIN: 7.8 g/dL (ref 6.5–8.1)

## 2018-10-06 LAB — CBC WITH DIFFERENTIAL (CANCER CENTER ONLY)
ABS IMMATURE GRANULOCYTES: 0.03 10*3/uL (ref 0.00–0.07)
BASOS PCT: 1 %
Basophils Absolute: 0 10*3/uL (ref 0.0–0.1)
Eosinophils Absolute: 0.1 10*3/uL (ref 0.0–0.5)
Eosinophils Relative: 1 %
HCT: 39.2 % (ref 36.0–46.0)
Hemoglobin: 13.4 g/dL (ref 12.0–15.0)
IMMATURE GRANULOCYTES: 0 %
Lymphocytes Relative: 10 %
Lymphs Abs: 0.9 10*3/uL (ref 0.7–4.0)
MCH: 32.1 pg (ref 26.0–34.0)
MCHC: 34.2 g/dL (ref 30.0–36.0)
MCV: 93.8 fL (ref 80.0–100.0)
Monocytes Absolute: 0.9 10*3/uL (ref 0.1–1.0)
Monocytes Relative: 11 %
NEUTROS ABS: 6.6 10*3/uL (ref 1.7–7.7)
NEUTROS PCT: 77 %
PLATELETS: 319 10*3/uL (ref 150–400)
RBC: 4.18 MIL/uL (ref 3.87–5.11)
RDW: 17.8 % — ABNORMAL HIGH (ref 11.5–15.5)
WBC: 8.5 10*3/uL (ref 4.0–10.5)
nRBC: 0 % (ref 0.0–0.2)

## 2018-10-06 MED ORDER — SODIUM CHLORIDE 0.9 % IV SOLN
Freq: Once | INTRAVENOUS | Status: AC
  Administered 2018-10-06: 10:00:00 via INTRAVENOUS
  Filled 2018-10-06: qty 250

## 2018-10-06 MED ORDER — DIPHENOXYLATE-ATROPINE 2.5-0.025 MG PO TABS: 1.0000 | ORAL_TABLET | Freq: Three times a day (TID) | ORAL | 0 refills | Status: DC

## 2018-10-06 MED FILL — CAPECITABINE 500 MG TABS: 500 | 21 days supply | Qty: 70 | Fill #0

## 2018-10-06 MED FILL — DIPHENOXYLATE-ATROPINE 2.5-: 2.5-0.025 | 30 days supply | Qty: 90 | Fill #0

## 2018-10-06 NOTE — Patient Instructions (Signed)
Dehydration, Adult Dehydration is a condition in which there is not enough fluid or water in the body. This happens when you lose more fluids than you take in. Important organs, such as the kidneys, brain, and heart, cannot function without a proper amount of fluids. Any loss of fluids from the body can lead to dehydration. Dehydration can range from mild to severe. This condition should be treated right away to prevent it from becoming severe. What are the causes? This condition may be caused by:  Vomiting.  Diarrhea.  Excessive sweating, such as from heat exposure or exercise.  Not drinking enough fluid, especially: ? When ill. ? While doing activity that requires a lot of energy.  Excessive urination.  Fever.  Infection.  Certain medicines, such as medicines that cause the body to lose excess fluid (diuretics).  Inability to access safe drinking water.  Reduced physical ability to get adequate water and food.  What increases the risk? This condition is more likely to develop in people:  Who have a poorly controlled long-term (chronic) illness, such as diabetes, heart disease, or kidney disease.  Who are age 65 or older.  Who are disabled.  Who live in a place with high altitude.  Who play endurance sports.  What are the signs or symptoms? Symptoms of mild dehydration may include:  Thirst.  Dry lips.  Slightly dry mouth.  Dry, warm skin.  Dizziness. Symptoms of moderate dehydration may include:  Very dry mouth.  Muscle cramps.  Dark urine. Urine may be the color of tea.  Decreased urine production.  Decreased tear production.  Heartbeat that is irregular or faster than normal (palpitations).  Headache.  Light-headedness, especially when you stand up from a sitting position.  Fainting (syncope). Symptoms of severe dehydration may include:  Changes in skin, such as: ? Cold and clammy skin. ? Blotchy (mottled) or pale skin. ? Skin that does  not quickly return to normal after being lightly pinched and released (poor skin turgor).  Changes in body fluids, such as: ? Extreme thirst. ? No tear production. ? Inability to sweat when body temperature is high, such as in hot weather. ? Very little urine production.  Changes in vital signs, such as: ? Weak pulse. ? Pulse that is more than 100 beats a minute when sitting still. ? Rapid breathing. ? Low blood pressure.  Other changes, such as: ? Sunken eyes. ? Cold hands and feet. ? Confusion. ? Lack of energy (lethargy). ? Difficulty waking up from sleep. ? Short-term weight loss. ? Unconsciousness. How is this diagnosed? This condition is diagnosed based on your symptoms and a physical exam. Blood and urine tests may be done to help confirm the diagnosis. How is this treated? Treatment for this condition depends on the severity. Mild or moderate dehydration can often be treated at home. Treatment should be started right away. Do not wait until dehydration becomes severe. Severe dehydration is an emergency and it needs to be treated in a hospital. Treatment for mild dehydration may include:  Drinking more fluids.  Replacing salts and minerals in your blood (electrolytes) that you may have lost. Treatment for moderate dehydration may include:  Drinking an oral rehydration solution (ORS). This is a drink that helps you replace fluids and electrolytes (rehydrate). It can be found at pharmacies and retail stores. Treatment for severe dehydration may include:  Receiving fluids through an IV tube.  Receiving an electrolyte solution through a feeding tube that is passed through your nose   and into your stomach (nasogastric tube, or NG tube).  Correcting any abnormalities in electrolytes.  Treating the underlying cause of dehydration. Follow these instructions at home:  If directed by your health care provider, drink an ORS: ? Make an ORS by following instructions on the  package. ? Start by drinking small amounts, about  cup (120 mL) every 5-10 minutes. ? Slowly increase how much you drink until you have taken the amount recommended by your health care provider.  Drink enough clear fluid to keep your urine clear or pale yellow. If you were told to drink an ORS, finish the ORS first, then start slowly drinking other clear fluids. Drink fluids such as: ? Water. Do not drink only water. Doing that can lead to having too little salt (sodium) in the body (hyponatremia). ? Ice chips. ? Fruit juice that you have added water to (diluted fruit juice). ? Low-calorie sports drinks.  Avoid: ? Alcohol. ? Drinks that contain a lot of sugar. These include high-calorie sports drinks, fruit juice that is not diluted, and soda. ? Caffeine. ? Foods that are greasy or contain a lot of fat or sugar.  Take over-the-counter and prescription medicines only as told by your health care provider.  Do not take sodium tablets. This can lead to having too much sodium in the body (hypernatremia).  Eat foods that contain a healthy balance of electrolytes, such as bananas, oranges, potatoes, tomatoes, and spinach.  Keep all follow-up visits as told by your health care provider. This is important. Contact a health care provider if:  You have abdominal pain that: ? Gets worse. ? Stays in one area (localizes).  You have a rash.  You have a stiff neck.  You are more irritable than usual.  You are sleepier or more difficult to wake up than usual.  You feel weak or dizzy.  You feel very thirsty.  You have urinated only a small amount of very dark urine over 6-8 hours. Get help right away if:  You have symptoms of severe dehydration.  You cannot drink fluids without vomiting.  Your symptoms get worse with treatment.  You have a fever.  You have a severe headache.  You have vomiting or diarrhea that: ? Gets worse. ? Does not go away.  You have blood or green matter  (bile) in your vomit.  You have blood in your stool. This may cause stool to look black and tarry.  You have not urinated in 6-8 hours.  You faint.  Your heart rate while sitting still is over 100 beats a minute.  You have trouble breathing. This information is not intended to replace advice given to you by your health care provider. Make sure you discuss any questions you have with your health care provider. Document Released: 10/18/2005 Document Revised: 05/14/2016 Document Reviewed: 12/12/2015 Elsevier Interactive Patient Education  2018 Elsevier Inc.  

## 2018-10-06 NOTE — Telephone Encounter (Signed)
Printed calendar and avs. °

## 2018-10-06 NOTE — Progress Notes (Signed)
Per Dr. Benay Spice: OK to d/c home with BP 88/60 and pulse 82. Continue to push po fluids at home.

## 2018-10-06 NOTE — Progress Notes (Signed)
  [facility] OFFICE PROGRESS NOTE   Diagnosis: Rectal cancer  INTERVAL HISTORY:   Ms. Albert returns as scheduled.  She began another cycle of adjuvant Xeloda on 09/18/2018.  No mouth sores or hand/foot pain.  She has noted dryness of the hands and feet.  She reports drinking adequate fluids.  She empties the ostomy bag 6-8 times per day.  She is taking Imodium 3 times per day.  Objective:  Vital signs in last 24 hours:  Blood pressure 101/77, pulse 98, temperature (!) 97 F (36.1 C), temperature source Oral, resp. rate 18, height '5\' 1"'$  (1.549 m), weight 100 lb 4.8 oz (45.5 kg), SpO2 100 %.    HEENT: The mucous membranes are moist, no thrush or ulcers Resp: Lungs clear bilaterally Cardio: Regular rate and rhythm GI: No hepatomegaly, tender surrounding the low transverse incision, no mass, dark liquid stool in the ostomy bag Vascular: No leg edema  Skin: Dryness of the palms and soles, few linear ulcerations at the fingers no erythema  Portacath/PICC-without erythema  Lab Results:  Lab Results  Component Value Date   WBC 8.5 10/06/2018   HGB 13.4 10/06/2018   HCT 39.2 10/06/2018   MCV 93.8 10/06/2018   PLT 319 10/06/2018   NEUTROABS 6.6 10/06/2018    CMP  Lab Results  Component Value Date   NA 135 10/06/2018   K 4.0 10/06/2018   CL 102 10/06/2018   CO2 20 (L) 10/06/2018   GLUCOSE 93 10/06/2018   BUN 23 10/06/2018   CREATININE 1.37 (H) 10/06/2018   CALCIUM 9.9 10/06/2018   PROT 7.8 10/06/2018   ALBUMIN 4.1 10/06/2018   AST 31 10/06/2018   ALT 26 10/06/2018   ALKPHOS 149 (H) 10/06/2018   BILITOT 0.4 10/06/2018   GFRNONAA 40 (L) 10/06/2018   GFRAA 46 (L) 10/06/2018    Lab Results  Component Value Date   CEA1 4.2 06/28/2018    Medications: I have reviewed the patient's current medications.   Assessment/Plan: 1. Rectal cancer, obstructing rectal mass on colonoscopy 03/01/2018-biopsy confirmed adenocarcinoma ? Incomplete colonoscopy  secondary to obstructing tumor ? Staging CTs on 03/03/2018-small mediastinal/hilar lymph nodes, indeterminate lingula nodule, rectal mass, small perirectal lymph nodes, mass measured at approximately 4.9 cm from the anal verge ? MRI pelvis 03/11/2018-T3c,N2 ? Radiation/Xeloda 04/03/2018-05/11/2018 ? Colonoscopy 07/04/2018- mass at 8 cm from the dentate line, polypsremoved from the hepatic flexure and descending colon-tubular adenomas ? Low anterior resection/ileostomy 07/12/2018, T3N0 moderately differentiated adenocarcinoma, margins negative, 11- lymph nodes, no lymphovascular or perineural invasion, MSI-stable, no loss of mismatch repair protein expression ? Cycle 1 adjuvant Xeloda 08/07/2018 ? Cycle 2 adjuvant Xeloda 08/28/2018 ? Cycle 3 adjuvant Xeloda 09/18/2018 ? Cycle 4 adjuvant Xeloda 10/09/2018 2. Tobacco use 3. COPD 4. Family history of multiple cancers    Disposition: Deborah Arroyo appears to be tolerating the Xeloda well.  She has high output from the ileostomy and the chemistry panel suggest a degree of dehydration.  She will receive IV fluids today.  She will continue Imodium.  We added Lomotil.  She will return for an office visit and chemistry panel next week.  She will contact us for symptoms of dehydration.  25 minutes were spent with the patient today.  The majority of the time was used for counseling and coordination of care.  Betsy Coder, MD  10/06/2018  9:35 AM

## 2018-10-13 ENCOUNTER — Inpatient Hospital Stay: Payer: Medicare Other

## 2018-10-13 ENCOUNTER — Inpatient Hospital Stay (HOSPITAL_BASED_OUTPATIENT_CLINIC_OR_DEPARTMENT_OTHER): Payer: Medicare Other | Admitting: Nurse Practitioner

## 2018-10-13 ENCOUNTER — Telehealth: Payer: Self-pay

## 2018-10-13 VITALS — BP 106/65 | HR 93 | Temp 97.5°F | Resp 17 | Ht 61.0 in | Wt 104.0 lb

## 2018-10-13 DIAGNOSIS — Z932 Ileostomy status: Secondary | ICD-10-CM | POA: Diagnosis not present

## 2018-10-13 DIAGNOSIS — Z9221 Personal history of antineoplastic chemotherapy: Secondary | ICD-10-CM

## 2018-10-13 DIAGNOSIS — L271 Localized skin eruption due to drugs and medicaments taken internally: Secondary | ICD-10-CM

## 2018-10-13 DIAGNOSIS — C20 Malignant neoplasm of rectum: Secondary | ICD-10-CM

## 2018-10-13 DIAGNOSIS — R197 Diarrhea, unspecified: Secondary | ICD-10-CM

## 2018-10-13 DIAGNOSIS — Z79899 Other long term (current) drug therapy: Secondary | ICD-10-CM

## 2018-10-13 DIAGNOSIS — E860 Dehydration: Secondary | ICD-10-CM | POA: Diagnosis not present

## 2018-10-13 DIAGNOSIS — Z923 Personal history of irradiation: Secondary | ICD-10-CM

## 2018-10-13 LAB — BASIC METABOLIC PANEL - CANCER CENTER ONLY
Anion gap: 10 (ref 5–15)
BUN: 18 mg/dL (ref 8–23)
CO2: 22 mmol/L (ref 22–32)
CREATININE: 0.87 mg/dL (ref 0.44–1.00)
Calcium: 9.8 mg/dL (ref 8.9–10.3)
Chloride: 103 mmol/L (ref 98–111)
GFR, Est AFR Am: >60 mL/min (ref >60)
GFR, Estimated: >60 mL/min (ref >60)
GLUCOSE: 97 mg/dL (ref 70–99)
Potassium: 4.4 mmol/L (ref 3.5–5.1)
Sodium: 135 mmol/L (ref 135–145)

## 2018-10-13 NOTE — Progress Notes (Addendum)
[place] OFFICE PROGRESS NOTE   Diagnosis: Rectal cancer  INTERVAL HISTORY:   [name] returns as scheduled.  She began cycle 4 adjuvant Xeloda beginning 10/09/2018.  She received IV fluids on 10/06/2018 due to concern for dehydration.  She denies nausea.  No mouth sores.  Ostomy output ranges from thick to loose.  She is taking Imodium and Lomotil as needed.  She notes "cracks" in the fingers.  Feet are burning, itching and sore over the past few days.  She continues to try to push fluids.  Objective:  Vital signs in last 24 hours:  Blood pressure 106/65, pulse 93, temperature (!) 97.5 F (36.4 C), temperature source Oral, resp. rate 17, height '5\' 1"'  (1.549 m), weight 104 lb (47.2 kg), SpO2 100 %.    HEENT: No thrush or ulcers.  Mucous membranes appear moist. Resp: Lungs clear bilaterally. Cardio: Regular rate and rhythm. GI: Abdomen soft and nontender.  No hepatomegaly.  Watery liquid stool in the ostomy bag. Vascular: No leg edema. Skin: Palms are dry, erythematous.  Few linear ulcerations at the fingers.  Soles erythematous, dry appearing.   Lab Results:  Lab Results  Component Value Date   WBC 8.5 10/06/2018   HGB 13.4 10/06/2018   HCT 39.2 10/06/2018   MCV 93.8 10/06/2018   PLT 319 10/06/2018   NEUTROABS 6.6 10/06/2018    Imaging:  No results found.  Medications: I have reviewed the patient's current medications.  Assessment/Plan: 1. Rectal cancer, obstructing rectal mass on colonoscopy 03/01/2018-biopsy confirmed adenocarcinoma ? Incomplete colonoscopy secondary to obstructing tumor ? Staging CTs on 03/03/2018-small mediastinal/hilar lymph nodes, indeterminate lingula nodule, rectal mass, small perirectal lymph nodes, mass measured at approximately 4.9 cm from the anal verge ? MRI pelvis 03/11/2018-T3c,N2 ? Radiation/Xeloda 04/03/2018-05/11/2018 ? Colonoscopy 07/04/2018- mass at 8 cm from the dentate line, polypsremoved from the hepatic flexure  and descending colon-tubular adenomas ? Low anterior resection/ileostomy 07/12/2018, T3N0 moderately differentiated adenocarcinoma, margins negative, 11- lymph nodes, no lymphovascular or perineural invasion, MSI-stable, no loss of mismatch repair protein expression ? Cycle 1 adjuvant Xeloda 08/07/2018 ? Cycle 2 adjuvant Xeloda 08/28/2018 ? Cycle 3 adjuvant Xeloda 09/18/2018 ? Cycle 4 adjuvant Xeloda 10/09/2018 (Xeloda held 10/13/2018, 10/14/2018 and 10/15/2018; resumed 10/16/2018 at a reduced dose of 1000 mg every morning and 500 mg every afternoon for the remainder of the cycle) 2. Tobacco use 3. COPD 4. Family history of multiple cancers  Disposition: Deborah Arroyo appears stable.  She is currently completing cycle 4 adjuvant Xeloda.  She continues to have fairly significant diarrhea and has also developed hand-foot syndrome.  She will hold Xeloda today, 10/14/2018 and 10/15/2018.  If the hands and feet are better on 10/16/2018 she will resume Xeloda at a reduced dose of 1000 mg every morning and 500 mg every afternoon.  She will return for lab and follow-up on 10/18/2018.  She will contact the office in the interim with any problems.  Patient seen with Dr. Benay Spice.    Ned Card ANP/GNP-BC   10/13/2018  3:05 PM  This was a shared visit with Ned Card.  Deborah Arroyo was interviewed and examined.  She has developed increased hand/foot symptoms secondary to Xeloda.  She continues to have large volume loose stool.  Xeloda will be placed on hold until 10/16/2018.  She will resume Xeloda at a reduced dose if the hand symptoms have improved 10/16/2018.  She will return for an office and lab visit on 10/18/2018.  Julieanne Manson, MD

## 2018-10-13 NOTE — Telephone Encounter (Signed)
Printed avs and calender of upcoming appointment. Per 12/13 los added iv fluids with SSC. Infusion room cap

## 2018-10-13 NOTE — Patient Instructions (Signed)
Hold Xeloda 12/13 pm, 12/14 and 12/15; if hands and feet are better resume Xeloda on 12/16, 2 pills in the morning and 1 pill in the evening.

## 2018-10-17 DIAGNOSIS — R922 Inconclusive mammogram: Secondary | ICD-10-CM | POA: Diagnosis not present

## 2018-10-17 DIAGNOSIS — Z09 Encounter for follow-up examination after completed treatment for conditions other than malignant neoplasm: Secondary | ICD-10-CM | POA: Diagnosis not present

## 2018-10-17 DIAGNOSIS — C20 Malignant neoplasm of rectum: Secondary | ICD-10-CM | POA: Diagnosis not present

## 2018-10-18 ENCOUNTER — Encounter: Payer: Self-pay | Admitting: Nurse Practitioner

## 2018-10-18 ENCOUNTER — Other Ambulatory Visit: Payer: Self-pay | Admitting: Oncology

## 2018-10-18 ENCOUNTER — Inpatient Hospital Stay: Payer: Medicare Other

## 2018-10-18 ENCOUNTER — Telehealth: Payer: Self-pay

## 2018-10-18 ENCOUNTER — Inpatient Hospital Stay (HOSPITAL_BASED_OUTPATIENT_CLINIC_OR_DEPARTMENT_OTHER): Payer: Medicare Other | Admitting: Nurse Practitioner

## 2018-10-18 VITALS — BP 99/63 | HR 86 | Temp 97.9°F | Resp 18 | Ht 61.0 in | Wt 102.2 lb

## 2018-10-18 DIAGNOSIS — C20 Malignant neoplasm of rectum: Secondary | ICD-10-CM

## 2018-10-18 DIAGNOSIS — Z923 Personal history of irradiation: Secondary | ICD-10-CM | POA: Diagnosis not present

## 2018-10-18 DIAGNOSIS — R197 Diarrhea, unspecified: Secondary | ICD-10-CM | POA: Diagnosis not present

## 2018-10-18 DIAGNOSIS — Z932 Ileostomy status: Secondary | ICD-10-CM | POA: Diagnosis not present

## 2018-10-18 DIAGNOSIS — Z79899 Other long term (current) drug therapy: Secondary | ICD-10-CM

## 2018-10-18 DIAGNOSIS — E860 Dehydration: Secondary | ICD-10-CM | POA: Diagnosis not present

## 2018-10-18 DIAGNOSIS — L271 Localized skin eruption due to drugs and medicaments taken internally: Secondary | ICD-10-CM | POA: Diagnosis not present

## 2018-10-18 DIAGNOSIS — Z9221 Personal history of antineoplastic chemotherapy: Secondary | ICD-10-CM

## 2018-10-18 LAB — CMP (CANCER CENTER ONLY)
ALT: 16 U/L (ref 0–44)
AST: 23 U/L (ref 15–41)
Albumin: 3.8 g/dL (ref 3.5–5.0)
Alkaline Phosphatase: 128 U/L — ABNORMAL HIGH (ref 38–126)
Anion gap: 8 (ref 5–15)
BUN: 14 mg/dL (ref 8–23)
CO2: 23 mmol/L (ref 22–32)
Calcium: 9.5 mg/dL (ref 8.9–10.3)
Chloride: 104 mmol/L (ref 98–111)
Creatinine: 1.04 mg/dL — ABNORMAL HIGH (ref 0.44–1.00)
GFR, Estimated: 55 mL/min — ABNORMAL LOW (ref >60)
Glucose, Bld: 92 mg/dL (ref 70–99)
Potassium: 4.1 mmol/L (ref 3.5–5.1)
Sodium: 135 mmol/L (ref 135–145)
Total Bilirubin: 0.5 mg/dL (ref 0.3–1.2)
Total Protein: 7 g/dL (ref 6.5–8.1)

## 2018-10-18 LAB — CBC WITH DIFFERENTIAL (CANCER CENTER ONLY)
Abs Immature Granulocytes: 0.02 10*3/uL (ref 0.00–0.07)
Basophils Absolute: 0 10*3/uL (ref 0.0–0.1)
Basophils Relative: 1 %
EOS ABS: 0.1 10*3/uL (ref 0.0–0.5)
EOS PCT: 2 %
HCT: 36.8 % (ref 36.0–46.0)
Hemoglobin: 12.6 g/dL (ref 12.0–15.0)
Immature Granulocytes: 0 %
Lymphocytes Relative: 12 %
Lymphs Abs: 0.8 10*3/uL (ref 0.7–4.0)
MCH: 33.2 pg (ref 26.0–34.0)
MCHC: 34.2 g/dL (ref 30.0–36.0)
MCV: 97.1 fL (ref 80.0–100.0)
Monocytes Absolute: 0.9 10*3/uL (ref 0.1–1.0)
Monocytes Relative: 13 %
Neutro Abs: 4.9 10*3/uL (ref 1.7–7.7)
Neutrophils Relative %: 72 %
PLATELETS: 289 10*3/uL (ref 150–400)
RBC: 3.79 MIL/uL — ABNORMAL LOW (ref 3.87–5.11)
RDW: 20.5 % — AB (ref 11.5–15.5)
WBC Count: 6.8 10*3/uL (ref 4.0–10.5)
nRBC: 0 % (ref 0.0–0.2)

## 2018-10-18 NOTE — Progress Notes (Signed)
  [place] OFFICE PROGRESS NOTE   Diagnosis: Rectal cancer  INTERVAL HISTORY:   Deborah Arroyo returns as scheduled.  She began cycle 4 adjuvant Xeloda 10/09/2018.  Xeloda was placed on hold 10/13/2018 with the plan to resume on 10/16/2018 at a reduced dose.  She resumed Xeloda at the reduced dose on 10/16/2018.  She notes output from the ostomy is thicker.  Hands and feet feel better.  She reports good fluid intake.  No nausea or vomiting.  No mouth sores.  Objective:  Vital signs in last 24 hours:  Blood pressure 99/63, pulse 86, temperature 97.9 F (36.6 C), temperature source Oral, resp. rate 18, height '5\' 1"'$  (1.549 m), weight 102 lb 3.2 oz (46.4 kg), SpO2 96 %.    HEENT: No thrush or ulcers. Resp: Lungs clear bilaterally. Cardio: Regular rate and rhythm. GI: Abdomen soft and nontender.  No hepatomegaly.  Liquid stool in the ostomy collection bag. Vascular: No leg edema. Skin: Palms with hyperpigmentation.  Scattered linear breaks in the skin on fingertips.  These areas appear to be healing.  Feet are dry appearing with mild erythema.   Lab Results:  Lab Results  Component Value Date   WBC 6.8 10/18/2018   HGB 12.6 10/18/2018   HCT 36.8 10/18/2018   MCV 97.1 10/18/2018   PLT 289 10/18/2018   NEUTROABS 4.9 10/18/2018    Imaging:  No results found.  Medications: I have reviewed the patient's current medications.  Assessment/Plan: 1. Rectal cancer, obstructing rectal mass on colonoscopy 03/01/2018-biopsy confirmed adenocarcinoma ? Incomplete colonoscopy secondary to obstructing tumor ? Staging CTs on 03/03/2018-small mediastinal/hilar lymph nodes, indeterminate lingula nodule, rectal mass, small perirectal lymph nodes, mass measured at approximately 4.9 cm from the anal verge ? MRI pelvis 03/11/2018-T3c,N2 ? Radiation/Xeloda 04/03/2018-05/11/2018 ? Colonoscopy 07/04/2018- mass at 8 cm from the dentate line, polypsremoved from the hepatic flexure and  descending colon-tubular adenomas ? Low anterior resection/ileostomy 07/12/2018, T3N0 moderately differentiated adenocarcinoma, margins negative, 11- lymph nodes, no lymphovascular or perineural invasion, MSI-stable, no loss of mismatch repair protein expression ? Cycle 1 adjuvant Xeloda 08/07/2018 ? Cycle 2 adjuvant Xeloda 08/28/2018 ? Cycle 3 adjuvant Xeloda 09/18/2018 ? Cycle 4 adjuvant Xeloda 10/09/2018 (Xeloda held 10/13/2018 pm dose, 10/14/2018 and 10/15/2018; resumed 10/16/2018 at a reduced dose of 1000 mg every morning and 500 mg every afternoon for the remainder of the cycle) 2. Tobacco use 3. COPD 4. Family history of multiple cancers  Disposition: Deborah Arroyo appears improved.  She resumed cycle 4 adjuvant Xeloda at a reduced dose on 10/16/2018.  The diarrhea and symptoms of hand-foot syndrome are better.  She will complete this cycle on 10/22/2018.  She will return for lab and follow-up on 10/26/2018.  She will contact the office in the interim with any problems.  Plan reviewed with Dr. Benay Spice.    Ned Card ANP/GNP-BC   10/18/2018  2:04 PM

## 2018-10-18 NOTE — Telephone Encounter (Signed)
Will wait until follow up on 12/26 to refill (prior dose reduction)

## 2018-10-18 NOTE — Telephone Encounter (Signed)
Per patient request to cancel her iron appointment. Per 12/18 patient request. Curnently there is nothing in the los

## 2018-10-20 ENCOUNTER — Other Ambulatory Visit: Payer: Self-pay | Admitting: Surgery

## 2018-10-20 DIAGNOSIS — C20 Malignant neoplasm of rectum: Secondary | ICD-10-CM

## 2018-10-23 ENCOUNTER — Encounter (HOSPITAL_COMMUNITY): Payer: Medicare Other

## 2018-10-24 DIAGNOSIS — D123 Benign neoplasm of transverse colon: Secondary | ICD-10-CM | POA: Diagnosis not present

## 2018-10-24 DIAGNOSIS — D124 Benign neoplasm of descending colon: Secondary | ICD-10-CM | POA: Diagnosis not present

## 2018-10-26 ENCOUNTER — Telehealth: Payer: Self-pay

## 2018-10-26 ENCOUNTER — Inpatient Hospital Stay (HOSPITAL_BASED_OUTPATIENT_CLINIC_OR_DEPARTMENT_OTHER): Payer: Medicare Other | Admitting: Oncology

## 2018-10-26 ENCOUNTER — Inpatient Hospital Stay: Payer: Medicare Other

## 2018-10-26 VITALS — BP 111/68 | HR 85 | Temp 97.8°F | Resp 17 | Ht 61.0 in | Wt 104.8 lb

## 2018-10-26 DIAGNOSIS — E860 Dehydration: Secondary | ICD-10-CM | POA: Diagnosis not present

## 2018-10-26 DIAGNOSIS — Z79899 Other long term (current) drug therapy: Secondary | ICD-10-CM | POA: Diagnosis not present

## 2018-10-26 DIAGNOSIS — Z932 Ileostomy status: Secondary | ICD-10-CM | POA: Diagnosis not present

## 2018-10-26 DIAGNOSIS — L271 Localized skin eruption due to drugs and medicaments taken internally: Secondary | ICD-10-CM | POA: Diagnosis not present

## 2018-10-26 DIAGNOSIS — C20 Malignant neoplasm of rectum: Secondary | ICD-10-CM | POA: Diagnosis not present

## 2018-10-26 DIAGNOSIS — Z9221 Personal history of antineoplastic chemotherapy: Secondary | ICD-10-CM | POA: Diagnosis not present

## 2018-10-26 DIAGNOSIS — R197 Diarrhea, unspecified: Secondary | ICD-10-CM | POA: Diagnosis not present

## 2018-10-26 DIAGNOSIS — Z923 Personal history of irradiation: Secondary | ICD-10-CM | POA: Diagnosis not present

## 2018-10-26 LAB — CBC WITH DIFFERENTIAL (CANCER CENTER ONLY)
Abs Immature Granulocytes: 0.09 10*3/uL — ABNORMAL HIGH (ref 0.00–0.07)
Basophils Absolute: 0.1 10*3/uL (ref 0.0–0.1)
Basophils Relative: 1 %
EOS PCT: 2 %
Eosinophils Absolute: 0.2 10*3/uL (ref 0.0–0.5)
HCT: 37 % (ref 36.0–46.0)
HEMOGLOBIN: 12.6 g/dL (ref 12.0–15.0)
Immature Granulocytes: 1 %
LYMPHS PCT: 6 %
Lymphs Abs: 0.8 10*3/uL (ref 0.7–4.0)
MCH: 33.8 pg (ref 26.0–34.0)
MCHC: 34.1 g/dL (ref 30.0–36.0)
MCV: 99.2 fL (ref 80.0–100.0)
Monocytes Absolute: 1.1 10*3/uL — ABNORMAL HIGH (ref 0.1–1.0)
Monocytes Relative: 9 %
Neutro Abs: 10 10*3/uL — ABNORMAL HIGH (ref 1.7–7.7)
Neutrophils Relative %: 81 %
Platelet Count: 279 10*3/uL (ref 150–400)
RBC: 3.73 MIL/uL — ABNORMAL LOW (ref 3.87–5.11)
RDW: 22.5 % — ABNORMAL HIGH (ref 11.5–15.5)
WBC Count: 12.2 10*3/uL — ABNORMAL HIGH (ref 4.0–10.5)
nRBC: 0.2 % (ref 0.0–0.2)

## 2018-10-26 LAB — CMP (CANCER CENTER ONLY)
ALT: 23 U/L (ref 0–44)
AST: 27 U/L (ref 15–41)
Albumin: 3.8 g/dL (ref 3.5–5.0)
Alkaline Phosphatase: 124 U/L (ref 38–126)
Anion gap: 8 (ref 5–15)
BUN: 13 mg/dL (ref 8–23)
CO2: 24 mmol/L (ref 22–32)
Calcium: 9.4 mg/dL (ref 8.9–10.3)
Chloride: 104 mmol/L (ref 98–111)
Creatinine: 0.94 mg/dL (ref 0.44–1.00)
GFR, Est AFR Am: >60 mL/min (ref >60)
GFR, Estimated: >60 mL/min (ref >60)
Glucose, Bld: 74 mg/dL (ref 70–99)
Potassium: 3.6 mmol/L (ref 3.5–5.1)
Sodium: 136 mmol/L (ref 135–145)
Total Bilirubin: 0.5 mg/dL (ref 0.3–1.2)
Total Protein: 7.3 g/dL (ref 6.5–8.1)

## 2018-10-26 MED ORDER — CAPECITABINE 500 MG PO TABS: ORAL_TABLET | ORAL | 0 refills | Status: DC

## 2018-10-26 MED FILL — CAPECITABINE 500 MG TABS: 500 | 2 days supply | Qty: 6 | Fill #0

## 2018-10-26 NOTE — Progress Notes (Signed)
[facility] OFFICE PROGRESS NOTE   Diagnosis: Rectal cancer  INTERVAL HISTORY:   Deborah Arroyo returns as scheduled.  She completed the most recent cycle of Xeloda 10/22/2018.  No mouth sores or hand/foot pain.  The hands remain dry with areas of cracking.  She empties the ostomy bag 7-9 times per day.  Bag is not completely full when she empties it and she reports the stool is generally formed.  She has been congested for the past few weeks.  Objective:  Vital signs in last 24 hours:  Blood pressure 111/68, pulse 85, temperature 97.8 F (36.6 C), temperature source Oral, resp. rate 17, height _0  (1.549 m), weight 104 lb 12.8 oz (47.5 kg), SpO2 99 %.    HEENT: No thrush or ulcers Resp: Distant breath sounds, scattered inspiratory/expiratory rhonchi at the left posterior chest, no respiratory distress Cardio: Regular rate and rhythm GI: No hepatomegaly, right lower quadrant ileostomy with liquid stool Vascular: No leg edema  Skin: Palms and soles with skin thickening and dryness.  Mild erythema at the palms.  Few areas of linear ulceration over the hands    Lab Results:  Lab Results  Component Value Date   WBC 12.2 (H) 10/26/2018   HGB 12.6 10/26/2018   HCT 37.0 10/26/2018   MCV 99.2 10/26/2018   PLT 279 10/26/2018   NEUTROABS 10.0 (H) 10/26/2018    CMP  Lab Results  Component Value Date   NA 135 10/18/2018   K 4.1 10/18/2018   CL 104 10/18/2018   CO2 23 10/18/2018   GLUCOSE 92 10/18/2018   BUN 14 10/18/2018   CREATININE 1.04 (H) 10/18/2018   CALCIUM 9.5 10/18/2018   PROT 7.0 10/18/2018   ALBUMIN 3.8 10/18/2018   AST 23 10/18/2018   ALT 16 10/18/2018   ALKPHOS 128 (H) 10/18/2018   BILITOT 0.5 10/18/2018   GFRNONAA 55 (L) 10/18/2018   GFRAA >60 10/18/2018    Lab Results  Component Value Date   CEA1 4.2 06/28/2018    Medications: I have reviewed the patient's current medications.   Assessment/Plan: 1. Rectal cancer, obstructing rectal  mass on colonoscopy 03/01/2018-biopsy confirmed adenocarcinoma ? Incomplete colonoscopy secondary to obstructing tumor ? Staging CTs on 03/03/2018-small mediastinal/hilar lymph nodes, indeterminate lingula nodule, rectal mass, small perirectal lymph nodes, mass measured at approximately 4.9 cm from the anal verge ? MRI pelvis 03/11/2018-T3c,N2 ? Radiation/Xeloda 04/03/2018-05/11/2018 ? Colonoscopy 07/04/2018- mass at 8 cm from the dentate line, polypsremoved from the hepatic flexure and descending colon-tubular adenomas ? Low anterior resection/ileostomy 07/12/2018, T3N0 moderately differentiated adenocarcinoma, margins negative, 11- lymph nodes, no lymphovascular or perineural invasion, MSI-stable, no loss of mismatch repair protein expression ? Cycle 1 adjuvant Xeloda 08/07/2018 ? Cycle 2 adjuvant Xeloda 08/28/2018 ? Cycle 3 adjuvant Xeloda 09/18/2018 ? Cycle 4 adjuvant Xeloda 10/09/2018 (Xeloda held 10/13/2018 pm dose, 10/14/2018 and 10/15/2018; resumed 10/16/2018 at a reduced dose of 1000 mg every morning and 500 mg every afternoon for the remainder of the cycle) ? Cycle 5 adjuvant Xeloda 10/30/2018 2. Tobacco use 3. COPD 4. Family history of multiple cancers    Disposition: Deborah Arroyo appears well today.  She tolerated the most recent cycle of Xeloda well.  She will begin the final cycle of adjuvant Xeloda 10/30/2018.  She will return for an office and lab visit in approximately 3 weeks.  15 minutes were spent with the patient today.  The majority of the time was used for counseling and coordination of care.  Dominica Severin  Benay Spice, MD  10/26/2018  12:15 PM

## 2018-10-26 NOTE — Telephone Encounter (Signed)
Printed avs and calender of upcoming appointment. Per 12/26 los 

## 2018-10-27 ENCOUNTER — Ambulatory Visit: Payer: Self-pay | Admitting: Surgery

## 2018-10-27 NOTE — H&P (Signed)
CC: Rectal cancer - f/u  HPI: Deborah Arroyo is a pleasant 67yoF with no known past medical history. For at least the last year she reports having had issues with hematochezia with each bowel movement. She denies whatsoever any abdominal pain, cramping, bloating, nausea, vomiting. She reports having normal bowel movements throughout all this. She has been tolerating a diet well. She was seen by Dr. Hilarie Fredrickson for the hematochezia and taken for colonoscopy on 03/01/18 which was notable for a circumferential partially obstructing mass in the rectum approximately 5 cm from the anal verge that could not be traversed with a EGD scope. Biopsies returned adenocarcinoma. The area distal to this mass was tattooed. She reports issues with incontinence of her feces for at least the last 1-2 years and this has been getting worse over time. Her incontinence is primarily to liquid stool as well as gas. She wears a pad daily. She does not believe this significantly impacts her daily quality of life or changes her activities based on this, but experiences daily accidents. She reports her weight as being stable and has gained 3 lbs in the last month.  She was seen by one of my colleagues in medical oncology, Dr. Benay Spice earlier this week and was quite frustrated with her new diagnosis of cancer as well as the amount of information being delivered to her regarding all of this. She left his office frustrated and declined follow-up the following day with Dr. Lisbeth Renshaw radiation oncology.  She underwent MRI pelvis 03/13/18 which demonstrated a locally advanced rectal cancer- cT3cN2. CT C/A/P 03/03/18 - showed a large rectal mass approximately 6 cm in length 5 cm of anorectal junction with multiple surrounding lymph nodes. Mildly enlarged right hilar lymph node not favored to be metastatic based on the radiology interpretation and other subcentimeter nodules which warrant surveillance  She completed Xeloda + XRT 05/11/18. She  tolerated this reasonably well. Since completing this, she reports improvement in some of her symptoms. She still does have some incontinent episodes to primarily liquid stool and accidents occurring once a week. She does occasionally also have incontinence to gas. She denies incontinence to solid stool. She does not wear a pad or diaper.  She completed her colonoscopy with Dr. Hilarie Fredrickson preoperatively which demonstrated a nonobstructing mass in the proximal rectum that he measured 8 cm from the dentate. No bleeding was present. Distal to the mass tattoo was noted. There is additionally a 10 mm polyp in hepatic flexure that was removed piecemeal. Finally a polyp was found in descending colon and removed. Pathology revealed TAs  OR 07/12/18 - laparoscopic low anterior resection with coloproctostomy (anastomosis 4.5 cm from anal verge), diverting loop ileostomy, flexible sigmoidoscopy, cysto/stents. She recovered well postoperatively. She did initially have issues with high stoma output which kept her in the hospital a couple extra days. She had a small notable parastomal hernia at the time as well. She was discharged home. She returned to the emergency room 9/26 with a leaking stoma and one episode nausea/vomiting previously. She was seen in emergency room and noted to have a leukocytosis. She had no fevers. She had no abdominal pain. She was tolerating a diet without nausea or vomiting at that time. Her stoma was working. She underwent evaluation with a CT of the head as well as A/P which were largely unremarkable-she did have a small parastomal hernia but her ileostomy was productive and contrast was noted in the bag. Her chest x-ray demonstrated no evidence of pneumonia. Her urinalysis demonstrated  no UTI. She was subsequently discharged back home  Pathology returned invasive moderately differentiated colorectal adenocarcinoma 2.5 cm extending in the perirectal connective tissue, margins  negative, 0 of 11 lymph nodes, separate tubular adenoma 0.5 cm. LVI: No PNI: No  [id number]  Her case was presented at our multispecialty disparity report and the consensus was for her to proceed with adjuvant chemotherapy given her pretreatment and preoperative staging. She declined infusional agents and is therefore being given adjuvant capecitabine beginning 08/07/18  INTERVAL HX On adjuvant capecitabine since 08/07/18 and tolerating reasonably well. She reports a good appetite and has been eating pretty well. Her ostomy outputs have been no higher than 1L in 24 hours. She is now taking when necessary Imodium as the output is watery when she is on Xeloda but putting in consistency would not. She denies a nausea or vomiting.   PMH: Denies  PSH: Open appendectomy - RLQ Laparoscopic cholecystectomy BTL-low midline incision Hysterectomy-TVH-denies prior radiation but this was done for cervical cancer Shoulder surgery Eye surgery  OB/Gyn: G4P3, all vaginal deliveries  FHx: Brother-prostate ca Mom-uterine ca Sister-gastric and ovarian ca Brother-lung cancer Daughter-breast ca  Social: Denies use of tobacco-one to one and a half packs per day for the last 40-42 years; denies use of alcohol or drugs. She is currently retired-has held numerous prior jobs, the most recent being it [employer]  ROS: A comprehensive 10 system review of systems was completed with the patient and pertinent findings as noted above  Hgb 12/2017 14.7 (from 14.3 08/2017) CEA 03/01/18: 1.9 CT C/A/P 03/03/18: Large rectal mass approximate 6 cm in length located 5 cm from the anorectal junction with multiple surrounding perirectal/presacral lymph nodes likely representing local nodal metastatic spread. Mildly enlarged right hilar lymph node favored incisional by radiologist. 6 x 3 mm lingular nodule warranting surveillance.  The patient is a 67 year old female.   Allergies Alean Rinne, [place]; 10/17/2018 9:50  AM) Aleve *ANALGESICS - ANTI-INFLAMMATORY*  Urticaria, Swelling. Allergies Reconciled   Medication History Alean Rinne, [place]; 10/17/2018 9:52 AM) Imodium (2MG  Capsule, Oral) Active. Ferrous Sulfate (325 (65 Fe)MG Tablet, Oral) Active. Fiber (Oral) Active. Diphenoxylate-Atropine (2.5-0.025MG  Tablet, Oral) Active. Xeloda (500MG  Tablet, Oral three times daily) Active. Medications Reconciled    Review of Systems Harrell Gave M. Naeem Quillin MD; 10/17/2018 10:02 AM) General Not Present- Appetite Loss, Chills, Fatigue, Fever, Night Sweats, Weight Gain and Weight Loss. Skin Not Present- Change in Wart/Mole, Dryness, Hives, Jaundice, New Lesions, Non-Healing Wounds, Rash and Ulcer. Respiratory Not Present- Decreased Exercise Tolerance and Difficulty Breathing. Breast Not Present- Breast Mass, Breast Pain, Nipple Discharge and Skin Changes. Gastrointestinal Not Present- Abdominal Pain, Bloating, Bloody Stool, Change in Bowel Habits, Chronic diarrhea, Constipation, Excessive gas, Hemorrhoids, Indigestion, Nausea, Rectal Pain and Vomiting. Female Genitourinary Not Present- Frequency, Nocturia, Painful Urination, Pelvic Pain and Urgency. Musculoskeletal Present- Back Pain. Not Present- Joint Pain, Joint Stiffness, Muscle Pain, Muscle Weakness and Swelling of Extremities. Neurological Not Present- Decreased Memory, Fainting, Headaches, Numbness, Seizures, Tingling, Tremor, Trouble walking and Weakness. Endocrine Not Present- Cold Intolerance, Excessive Hunger, Hair Changes, Heat Intolerance, Hot flashes and New Diabetes. Hematology Not Present- Blood Thinners, Easy Bruising, Excessive bleeding, Gland problems, HIV and Persistent Infections.  Vitals Mardene Celeste King RMA; 10/17/2018 9:50 AM) 10/17/2018 9:50 AM Weight: 101.8 lb Height: 61in Body Surface Area: 1.42 m Body Mass Index: 19.23 kg/m  Temp.: 97.76F  Pulse: 97 (Regular)  BP: 110/78 (Sitting, Left Arm,  Standard)       Physical Exam [place]rell Gave M. Sayaka Hoeppner  MD; 10/17/2018 10:03 AM) The physical exam findings are as follows: Note:Constitutional: No acute distress; conversant; no deformities Eyes: Moist conjunctiva; no lid lag; anicteric sclerae; pupils equal round and reactive to light Neck: Trachea midline; no palpable thyromegaly Lungs: Normal respiratory effort; no tactile fremitus CV: Regular rate and rhythm; no palpable thrill; no pitting edema GI: Abdomen soft, nontender, nondistended; no palpable hepatosplenomegaly. Loop ileostomy in place - protruding nicely; pink; thin dark green output in appliance. MSK: Normal gait; no clubbing/cyanosis Psychiatric: Appropriate affect; alert and oriented 3 Lymphatic: No palpable cervical or axillary lymphadenopathy    Assessment & Plan Harrell Gave M. Breeana Sawtelle MD; 10/17/2018 10:04 AM) RECTAL CANCER (C20) Story: Deborah Arroyo is a pleasant 43yoF with 20yr hx of mid rectal cancer - 5cm from anal verge; CEA 1.9; no evidence of metastatic disease on CT C/A/P; MRI-P shows this to be cT3cN2 - s/p Xeloda+XRT - completed 05/11/18  S/P Laparoscopic LAR with coloproctostomy and DLI 07/12/18 Pathology: [id number] Undergoing adjuvant capecitabine - started 08/07/18 Impression: -Her ostomy outputs are well controlled, she is recovering well and tolerating a diet -I recommended she continue her fiber supplement and drinking plenty of fluids-64 ounces of water per day. PRN imodium as she is doing. -She is on adjuvant chemotherapy with planned completion date of 11/12/2018. -We discussed that at her next visit, we will begin ordering tests to interrogate the colorectal anastomosis -Will obtian gastrograffin enema via anus -Will schedule for flexible sigmoidoscopy - end of January 2020  Signed electronically by Ileana Roup, MD (10/17/2018 10:06 AM)

## 2018-11-02 ENCOUNTER — Other Ambulatory Visit: Payer: Self-pay | Admitting: *Deleted

## 2018-11-02 MED ORDER — DIPHENOXYLATE-ATROPINE 2.5-0.025 MG PO TABS: 1.0000 | ORAL_TABLET | Freq: Three times a day (TID) | ORAL | 0 refills | Status: DC

## 2018-11-02 NOTE — Progress Notes (Signed)
Patient called asking if she should continue Lomotil.  Reports improvement in consistency of bowel movements from watery to thick pudding and decrease in frequency of emptying of ileostomy bag.    Per Ned Card, NP ok to refill.    Per patients request [place].  Called, LM for refill at [facility].  Patient notified.

## 2018-11-03 MED FILL — DIPHENOXYLATE-ATROPINE 2.5-: 2.5-0.025 | 30 days supply | Qty: 90 | Fill #0

## 2018-11-08 ENCOUNTER — Ambulatory Visit (HOSPITAL_COMMUNITY)
Admission: RE | Admit: 2018-11-08 | Discharge: 2018-11-08 | Disposition: A | Discharge status: Home / Self Care | Payer: Medicare Other | Source: Ambulatory Visit | Attending: Surgery | Admitting: Surgery

## 2018-11-08 ENCOUNTER — Other Ambulatory Visit: Payer: Self-pay | Admitting: Surgery

## 2018-11-08 ENCOUNTER — Inpatient Hospital Stay: Admission: RE | Admit: 2018-11-08 | Payer: Medicare Other | Source: Ambulatory Visit

## 2018-11-08 DIAGNOSIS — C20 Malignant neoplasm of rectum: Secondary | ICD-10-CM | POA: Diagnosis not present

## 2018-11-08 DIAGNOSIS — Z85048 Personal history of other malignant neoplasm of rectum, rectosigmoid junction, and anus: Secondary | ICD-10-CM | POA: Diagnosis not present

## 2018-11-08 MED ORDER — IOHEXOL 300 MG/ML  SOLN
450.0000 mL | Freq: Once | INTRAMUSCULAR | Status: AC | PRN
  Administered 2018-11-08: 450 mL via ORAL

## 2018-11-16 ENCOUNTER — Encounter: Payer: Self-pay | Admitting: Nurse Practitioner

## 2018-11-16 ENCOUNTER — Inpatient Hospital Stay: Payer: Medicare Other | Attending: Oncology | Admitting: Nurse Practitioner

## 2018-11-16 ENCOUNTER — Telehealth: Payer: Self-pay | Admitting: Oncology

## 2018-11-16 ENCOUNTER — Inpatient Hospital Stay: Payer: Medicare Other

## 2018-11-16 VITALS — BP 103/65 | HR 85 | Temp 97.9°F | Resp 18 | Ht 61.0 in | Wt 101.6 lb

## 2018-11-16 DIAGNOSIS — C20 Malignant neoplasm of rectum: Secondary | ICD-10-CM | POA: Diagnosis not present

## 2018-11-16 DIAGNOSIS — Z809 Family history of malignant neoplasm, unspecified: Secondary | ICD-10-CM | POA: Insufficient documentation

## 2018-11-16 DIAGNOSIS — Z923 Personal history of irradiation: Secondary | ICD-10-CM | POA: Insufficient documentation

## 2018-11-16 DIAGNOSIS — J449 Chronic obstructive pulmonary disease, unspecified: Secondary | ICD-10-CM | POA: Insufficient documentation

## 2018-11-16 DIAGNOSIS — F17210 Nicotine dependence, cigarettes, uncomplicated: Secondary | ICD-10-CM | POA: Diagnosis not present

## 2018-11-16 LAB — CBC WITH DIFFERENTIAL (CANCER CENTER ONLY)
Abs Immature Granulocytes: 0.03 10*3/uL (ref 0.00–0.07)
Basophils Absolute: 0.1 10*3/uL (ref 0.0–0.1)
Basophils Relative: 1 %
Eosinophils Absolute: 0.1 10*3/uL (ref 0.0–0.5)
Eosinophils Relative: 2 %
HCT: 40.8 % (ref 36.0–46.0)
Hemoglobin: 13.8 g/dL (ref 12.0–15.0)
Immature Granulocytes: 0 %
LYMPHS ABS: 0.9 10*3/uL (ref 0.7–4.0)
Lymphocytes Relative: 11 %
MCH: 35.1 pg — AB (ref 26.0–34.0)
MCHC: 33.8 g/dL (ref 30.0–36.0)
MCV: 103.8 fL — AB (ref 80.0–100.0)
MONOS PCT: 9 %
Monocytes Absolute: 0.8 10*3/uL (ref 0.1–1.0)
Neutro Abs: 6.7 10*3/uL (ref 1.7–7.7)
Neutrophils Relative %: 77 %
Platelet Count: 281 10*3/uL (ref 150–400)
RBC: 3.93 MIL/uL (ref 3.87–5.11)
RDW: 21.4 % — ABNORMAL HIGH (ref 11.5–15.5)
WBC Count: 8.6 10*3/uL (ref 4.0–10.5)
nRBC: 0 % (ref 0.0–0.2)

## 2018-11-16 LAB — CMP (CANCER CENTER ONLY)
ALK PHOS: 126 U/L (ref 38–126)
ALT: 14 U/L (ref 0–44)
AST: 22 U/L (ref 15–41)
Albumin: 3.9 g/dL (ref 3.5–5.0)
Anion gap: 8 (ref 5–15)
BUN: 21 mg/dL (ref 8–23)
CO2: 23 mmol/L (ref 22–32)
Calcium: 9.9 mg/dL (ref 8.9–10.3)
Chloride: 102 mmol/L (ref 98–111)
Creatinine: 1.14 mg/dL — ABNORMAL HIGH (ref 0.44–1.00)
GFR, EST AFRICAN AMERICAN: 57 mL/min — AB (ref >60)
GFR, Estimated: 49 mL/min — ABNORMAL LOW (ref >60)
Glucose, Bld: 88 mg/dL (ref 70–99)
Potassium: 4.7 mmol/L (ref 3.5–5.1)
Sodium: 133 mmol/L — ABNORMAL LOW (ref 135–145)
Total Bilirubin: 0.5 mg/dL (ref 0.3–1.2)
Total Protein: 7.6 g/dL (ref 6.5–8.1)

## 2018-11-16 NOTE — Progress Notes (Signed)
  [place] OFFICE PROGRESS NOTE   Diagnosis: Rectal cancer  INTERVAL HISTORY:   Ms. Goslin returns as scheduled.  She completed cycle 5 adjuvant Xeloda beginning 10/30/2018.  She denies nausea/vomiting.  No mouth sores.  She notes stool is thicker, pudding consistency.  Some rectal discharge.  Hands feel slick.  No skin breakdown.  Objective:  Vital signs in last 24 hours:  Blood pressure 103/65, pulse 85, temperature 97.9 F (36.6 C), temperature source Oral, resp. rate 18, height _0  (1.549 m), weight 101 lb 9.6 oz (46.1 kg), SpO2 100 %.    HEENT: No thrush or ulcers. Lymphatics: No palpable cervical, supraclavicular, left axillary axillary or inguinal lymph nodes.  Shotty right axillary lymph nodes. Resp: Lungs clear bilaterally. Cardio: Regular rate and rhythm. GI: Abdomen soft and nontender.  No hepatomegaly.  Right abdomen ileostomy. Vascular: No leg edema. Skin: Palms with skin thickening.   Lab Results:  Lab Results  Component Value Date   WBC 8.6 11/16/2018   HGB 13.8 11/16/2018   HCT 40.8 11/16/2018   MCV 103.8 (H) 11/16/2018   PLT 281 11/16/2018   NEUTROABS 6.7 11/16/2018    Imaging:  No results found.  Medications: I have reviewed the patient's current medications.  Assessment/Plan: 1. Rectal cancer, obstructing rectal mass on colonoscopy 03/01/2018-biopsy confirmed adenocarcinoma ? Incomplete colonoscopy secondary to obstructing tumor ? Staging CTs on 03/03/2018-small mediastinal/hilar lymph nodes, indeterminate lingula nodule, rectal mass, small perirectal lymph nodes, mass measured at approximately 4.9 cm from the anal verge ? MRI pelvis 03/11/2018-T3c,N2 ? Radiation/Xeloda 04/03/2018-05/11/2018 ? Colonoscopy 07/04/2018- mass at 8 cm from the dentate line, polypsremoved from the hepatic flexure and descending colon-tubular adenomas ? Low anterior resection/ileostomy 07/12/2018, T3N0 moderately differentiated adenocarcinoma, margins  negative, 11- lymph nodes, no lymphovascular or perineural invasion, MSI-stable, no loss of mismatch repair protein expression ? Cycle 1 adjuvant Xeloda 08/07/2018 ? Cycle 2 adjuvant Xeloda 08/28/2018 ? Cycle 3 adjuvant Xeloda 09/18/2018 ? Cycle 4 adjuvant Xeloda 10/09/2018(Xeloda held 10/13/2018 pm dose, 10/14/2018 and 10/15/2018; resumed 10/16/2018 at a reduced dose of 1000 mg every morning and 500 mg every afternoon for the remainder of the cycle) ? Cycle 5 adjuvant Xeloda 10/30/2018 2. Tobacco use 3. COPD 4. Family history of multiple cancers  Disposition: Ms. Corney appears stable.  She has completed the course of adjuvant chemotherapy.  She is following up with Dr. Dema Severin regarding ileostomy reversal.  We discussed timing of surveillance CT scans.  We can coordinate with Dr. Dema Severin pending surgery date for the reversal.  She will return for a CEA and follow-up visit in 3 months.  She will contact the office in the interim with any problems.  Plan reviewed with Dr. Benay Spice.  Ned Card ANP/GNP-BC   11/16/2018  2:12 PM

## 2018-11-16 NOTE — Telephone Encounter (Signed)
Gave patient avs report and appointments for April  °

## 2018-12-04 ENCOUNTER — Ambulatory Visit (HOSPITAL_COMMUNITY)
Admission: RE | Admit: 2018-12-04 | Discharge: 2018-12-04 | Disposition: A | Discharge status: Home / Self Care | Payer: Medicare Other | Attending: Surgery | Admitting: Surgery

## 2018-12-04 ENCOUNTER — Encounter (HOSPITAL_COMMUNITY)
Admission: RE | Disposition: A | Discharge status: Home / Self Care | Payer: Self-pay | Source: Home / Self Care | Attending: Surgery

## 2018-12-04 ENCOUNTER — Ambulatory Visit (HOSPITAL_COMMUNITY): Payer: Medicare Other | Admitting: Certified Registered Nurse Anesthetist

## 2018-12-04 ENCOUNTER — Other Ambulatory Visit: Payer: Self-pay

## 2018-12-04 ENCOUNTER — Other Ambulatory Visit: Payer: Self-pay | Admitting: Nurse Practitioner

## 2018-12-04 ENCOUNTER — Encounter (HOSPITAL_COMMUNITY): Payer: Self-pay | Admitting: *Deleted

## 2018-12-04 DIAGNOSIS — F329 Major depressive disorder, single episode, unspecified: Secondary | ICD-10-CM | POA: Diagnosis not present

## 2018-12-04 DIAGNOSIS — Z85048 Personal history of other malignant neoplasm of rectum, rectosigmoid junction, and anus: Secondary | ICD-10-CM | POA: Diagnosis not present

## 2018-12-04 DIAGNOSIS — Z980 Intestinal bypass and anastomosis status: Secondary | ICD-10-CM | POA: Diagnosis not present

## 2018-12-04 DIAGNOSIS — Z09 Encounter for follow-up examination after completed treatment for conditions other than malignant neoplasm: Secondary | ICD-10-CM | POA: Insufficient documentation

## 2018-12-04 DIAGNOSIS — F17210 Nicotine dependence, cigarettes, uncomplicated: Secondary | ICD-10-CM | POA: Diagnosis not present

## 2018-12-04 DIAGNOSIS — M8580 Other specified disorders of bone density and structure, unspecified site: Secondary | ICD-10-CM | POA: Diagnosis not present

## 2018-12-04 DIAGNOSIS — K435 Parastomal hernia without obstruction or  gangrene: Secondary | ICD-10-CM | POA: Insufficient documentation

## 2018-12-04 DIAGNOSIS — R159 Full incontinence of feces: Secondary | ICD-10-CM | POA: Diagnosis not present

## 2018-12-04 DIAGNOSIS — C20 Malignant neoplasm of rectum: Secondary | ICD-10-CM

## 2018-12-04 DIAGNOSIS — E876 Hypokalemia: Secondary | ICD-10-CM | POA: Diagnosis not present

## 2018-12-04 HISTORY — PX: FLEXIBLE SIGMOIDOSCOPY: SHX5431

## 2018-12-04 LAB — GLUCOSE, CAPILLARY: GLUCOSE-CAPILLARY: 81 mg/dL (ref 70–99)

## 2018-12-04 SURGERY — SIGMOIDOSCOPY, FLEXIBLE
Anesthesia: Monitor Anesthesia Care

## 2018-12-04 MED ORDER — PROPOFOL 10 MG/ML IV BOLUS
INTRAVENOUS | Status: DC | PRN
  Administered 2018-12-04 (×2): 10 mg via INTRAVENOUS

## 2018-12-04 MED ORDER — PROPOFOL 10 MG/ML IV BOLUS
INTRAVENOUS | Status: AC
  Filled 2018-12-04: qty 40

## 2018-12-04 MED ORDER — SODIUM CHLORIDE 0.9 % IV SOLN: INTRAVENOUS | Status: DC

## 2018-12-04 MED ORDER — ONDANSETRON HCL 4 MG/2ML IJ SOLN
INTRAMUSCULAR | Status: DC | PRN
  Administered 2018-12-04: 4 mg via INTRAVENOUS

## 2018-12-04 MED ORDER — LACTATED RINGERS IV SOLN
INTRAVENOUS | Status: DC
  Administered 2018-12-04: 1000 mL via INTRAVENOUS

## 2018-12-04 MED ORDER — PHENYLEPHRINE 40 MCG/ML (10ML) SYRINGE FOR IV PUSH (FOR BLOOD PRESSURE SUPPORT)
PREFILLED_SYRINGE | INTRAVENOUS | Status: DC | PRN
  Administered 2018-12-04: 80 ug via INTRAVENOUS

## 2018-12-04 MED ORDER — PROPOFOL 500 MG/50ML IV EMUL
INTRAVENOUS | Status: DC | PRN
  Administered 2018-12-04: 150 ug/kg/min via INTRAVENOUS

## 2018-12-04 NOTE — H&P (Signed)
CC: Rectal cancer - f/u  HPI: Deborah Arroyo is a pleasant 55yoF with no known past medical history. For at least the last year she reports having had issues with hematochezia with each bowel movement. She denies whatsoever any abdominal pain, cramping, bloating, nausea, vomiting. She reports having normal bowel movements throughout all this. She has been tolerating a diet well. She was seen by Dr. Hilarie Fredrickson for the hematochezia and taken for colonoscopy on 03/01/18 which was notable for a circumferential partially obstructing mass in the rectum approximately 5 cm from the anal verge that could not be traversed with a EGD scope. Biopsies returned adenocarcinoma. The area distal to this mass was tattooed. She reports issues with incontinence of her feces for at least the last 1-2 years and this has been getting worse over time. Her incontinence is primarily to liquid stool as well as gas. She wears a pad daily. She does not believe this significantly impacts her daily quality of life or changes her activities based on this, but experiences daily accidents. She reports her weight as being stable and has gained 3 lbs in the last month.  She was seen by one of my colleagues in medical oncology, Dr. Benay Spice earlier this week and was quite frustrated with her new diagnosis of cancer as well as the amount of information being delivered to her regarding all of this. She left his office frustrated and declined follow-up the following day with Dr. Lisbeth Renshaw radiation oncology.  She underwent MRI pelvis 03/13/18 which demonstrated a locally advanced rectal cancer- cT3cN2. CT C/A/P 03/03/18 - showed a large rectal mass approximately 6 cm in length 5 cm of anorectal junction with multiple surrounding lymph nodes. Mildly enlarged right hilar lymph node not favored to be metastatic based on the radiology interpretation and other subcentimeter nodules which warrant surveillance  She completed Xeloda + XRT 05/11/18. She  tolerated this reasonably well. Since completing this, she reports improvement in some of her symptoms. She still does have some incontinent episodes to primarily liquid stool and accidents occurring once a week. She does occasionally also have incontinence to gas. She denies incontinence to solid stool. She does not wear a pad or diaper.  She completed her colonoscopy with Dr. Hilarie Fredrickson preoperatively which demonstrated a nonobstructing mass in the proximal rectum that he measured 8 cm from the dentate. No bleeding was present. Distal to the mass tattoo was noted. There is additionally a 10 mm polyp in hepatic flexure that was removed piecemeal. Finally a polyp was found in descending colon and removed. Pathology revealed TAs  OR 07/12/18 - laparoscopic low anterior resection with coloproctostomy (anastomosis 4.5 cm from anal verge), diverting loop ileostomy, flexible sigmoidoscopy, cysto/stents. She recovered well postoperatively. She did initially have issues with high stoma output which kept her in the hospital a couple extra days. She had a small notable parastomal hernia at the time as well. She was discharged home. She returned to the emergency room 9/26 with a leaking stoma and one episode nausea/vomiting previously. She was seen in emergency room and noted to have a leukocytosis. She had no fevers. She had no abdominal pain. She was tolerating a diet without nausea or vomiting at that time. Her stoma was working. She underwent evaluation with a CT of the head as well as A/P which were largely unremarkable-she did have a small parastomal hernia but her ileostomy was productive and contrast was noted in the bag. Her chest x-ray demonstrated no evidence of pneumonia. Her urinalysis demonstrated  no UTI. She was subsequently discharged back home  Pathology returned invasive moderately differentiated colorectal adenocarcinoma 2.5 cm extending in the perirectal connective tissue,  margins negative, 0 of 11 lymph nodes, separate tubular adenoma 0.5 cm. LVI: No PNI: No  [id number]  Her case was presented at our multispecialty disparity report and the consensus was for her to proceed with adjuvant chemotherapy given her pretreatment and preoperative staging. She declined infusional agents and is therefore being given adjuvant capecitabine beginning 08/07/18  She completed adjuvant capecitabine 11/12/2018 and tolerated it well. She reports a good appetite and has been eating pretty well. Her ostomy outputs have been no higher than 1L in 24 hours. She is now taking when necessary Imodium as the output is watery when she is on Xeloda but putting in consistency would not. She denies a nausea or vomiting.   GGE 11/08/2018 1. No leak at the rectosigmoid anastomosis status post abdominal perineal resection. 2. No reflux of the terminal ileum.  Ileostomy not opacified.  PMH: Denies  PSH: Open appendectomy - RLQ Laparoscopic cholecystectomy BTL-low midline incision Hysterectomy-TVH-denies prior radiation but this was done for cervical cancer Shoulder surgery Eye surgery  OB/Gyn: G4P3, all vaginal deliveries  FHx: Brother-prostate ca Mom-uterine ca Sister-gastric and ovarian ca Brother-lung cancer Daughter-breast ca  Social: Denies use of tobacco-one to one and a half packs per day for the last 40-42 years; denies use of alcohol or drugs. She is currently retired-has held numerous prior jobs, the most recent being it [employer]  Past Medical History:  Diagnosis Date  . Cancer ([place]) 03/01/2018   rectal cancer=had chemo pills and radation   . Cataract   . Cervix cancer ([place]) 1979  . Depression   . Dyspnea   . Hypoglycemia   . Hyponatremia    syncope with this and admitted twice due to this issue   . Osteopenia   . Rectal bleeding   . Smoker     Past Surgical History:  Procedure Laterality Date  . ABDOMINAL HYSTERECTOMY    . APPENDECTOMY    .  BIOPSY  07/04/2018   Procedure: BIOPSY;  Surgeon: Jerene Bears, MD;  Location: [facility];  Service: Gastroenterology;;  . CHOLECYSTECTOMY    . COLONOSCOPY  03/01/2018   Dr.Pyrtle  . COLONOSCOPY WITH PROPOFOL N/A 07/04/2018   Procedure: COLONOSCOPY WITH PROPOFOL;  Surgeon: Jerene Bears, MD;  Location: [facility];  Service: Gastroenterology;  Laterality: N/A;  . CYSTOSCOPY WITH STENT PLACEMENT Bilateral 07/12/2018   Procedure: CYSTOSCOPY WITH STENT PLACEMENT;  Surgeon: Lucas Mallow, MD;  Location: [facility];  Service: Urology;  Laterality: Bilateral;  . DIVERTING ILEOSTOMY N/A 07/12/2018   Procedure: DIVERTING LOOP ILEOSTOMY;  Surgeon: Ileana Roup, MD;  Location: [facility];  Service: General;  Laterality: N/A;  . EYE SURGERY    . FLEXIBLE SIGMOIDOSCOPY N/A 07/12/2018   Procedure: FLEXIBLE SIGMOIDOSCOPY;  Surgeon: Ileana Roup, MD;  Location: [facility];  Service: General;  Laterality: N/A;  . LAPAROSCOPIC LOW ANTERIOR RESECTION N/A 07/12/2018   Procedure: LAPAROSCOPIC LOW ANTERIOR RESECTION WITH COLOPROSTOSTOMY, ERAS PATHWAY;  Surgeon: Ileana Roup, MD;  Location: [facility];  Service: General;  Laterality: N/A;  . POLYPECTOMY  07/04/2018   Procedure: POLYPECTOMY;  Surgeon: Jerene Bears, MD;  Location: [facility];  Service: Gastroenterology;;  . SHOULDER SURGERY Left    Rotary Cuff Tear  . TUBAL LIGATION      Family History  Problem Relation Age of Onset  . Stomach  cancer Sister   . Ovarian cancer Sister   . Cancer - Ovarian Sister   . Dementia Mother   . Uterine cancer Mother   . Endometrial cancer Mother   . Prostate cancer Brother   . Cancer - Prostate Brother   . Cancer Other   . Cancer Brother        lung/brain  . Breast cancer Daughter   . Alzheimer's disease Father   . Colon cancer Neg Hx   . Esophageal cancer Neg Hx   . Pancreatic cancer Neg Hx   . Rectal cancer Neg Hx   . Colon polyps Neg Hx     Social:  reports that she has been smoking  cigarettes. She has been smoking about 1.00 pack per day. She has never used smokeless tobacco. She reports that she does not drink alcohol or use drugs.  Allergies:  Allergies  Allergen Reactions  . Aleve [Naproxen Sodium] Swelling and Other (See Comments)    Eyes and throat swell up    Medications: I have reviewed the patient's current medications.  Results for orders placed or performed during the hospital encounter of 12/04/18 (from the past 48 hour(s))  Glucose, capillary     Status: None   Collection Time: 12/04/18  6:41 AM  Result Value Ref Range   Glucose-Capillary 81 70 - 99 mg/dL    No results found.  ROS - all of the below systems have been reviewed with the patient and positives are indicated with bold text General: chills, fever or night sweats Eyes: blurry vision or double vision ENT: epistaxis or sore throat Allergy/Immunology: itchy/watery eyes or nasal congestion Hematologic/Lymphatic: bleeding problems, blood clots or swollen lymph nodes Endocrine: temperature intolerance or unexpected weight changes Breast: new or changing breast lumps or nipple discharge Resp: cough, shortness of breath, or wheezing CV: chest pain or dyspnea on exertion GI: as per HPI GU: dysuria, trouble voiding, or hematuria MSK: joint pain or joint stiffness Neuro: TIA or stroke symptoms Derm: pruritus and skin lesion changes Psych: anxiety and depression  PE Blood pressure 113/62, pulse 87, temperature 97.8 F (36.6 C), temperature source Oral, resp. rate 16, height 5\' 1"  (1.549 m), weight 45.8 kg, SpO2 100 %. Constitutional: NAD; conversant; no deformities Eyes: Moist conjunctiva; no lid lag; anicteric; PERRL Neck: Trachea midline; no thyromegaly Lungs: Normal respiratory effort; no tactile fremitus CV: RRR; no palpable thrills; no pitting edema GI: Abd soft, NT/ND; no palpable hepatosplenomegaly MSK: Normal gait; no clubbing/cyanosis Psychiatric: Appropriate affect; alert and  oriented x3 Lymphatic: No palpable cervical or axillary lymphadenopathy  Results for orders placed or performed during the hospital encounter of 12/04/18 (from the past 48 hour(s))  Glucose, capillary     Status: None   Collection Time: 12/04/18  6:41 AM  Result Value Ref Range   Glucose-Capillary 81 70 - 99 mg/dL    A/P: Deborah Arroyo is a pleasant 36yoF with 83yr hx of mid rectal cancer - 5cm from anal verge; CEA 1.9; no evidence of metastatic disease on CT C/A/P; MRI-P shows this to be cT3cN2 - s/p Xeloda+XRT - completed 05/11/18  S/P Laparoscopic LAR with coloproctostomy and DLI 07/12/18 Pathology: ypT3N0M0  Completed adjuvant Capecitabine -Planning flexible sigmoidoscopy today to evaluate anastomosis prior to considering ileostomy reversal -The anatomy and physiology of the GI tract was discussed at length with the patient. -We discussed flexible sigmoidoscopy, possible polypectomy/biopsy if indicated -The planned procedure, material risks (including, but not limited to, pain, bleeding, damage to surrounding structures-spleen,  perforation, need for additional procedures, worsening of pre-existing medical conditions, pneumonia, heart attack, stroke, death) benefits and alternatives to surgery were discussed at length. The patient's questions were answered to her satisfaction, she voiced understanding and elected to proceed with surgery.  Sharon Mt. Dema Severin, M.D. [facility]

## 2018-12-04 NOTE — Op Note (Signed)
[facility] Patient Name: Deborah Arroyo Procedure Date: 12/04/2018 MRN: [id number] Attending MD: Ileana Roup MD, MD Date of Birth: [birth year] CSN: [id number] Age: 68 Admit Type: Outpatient Procedure:                Flexible Sigmoidoscopy Indications:              Personal history of malignant rectal neoplasm, Post                            surgical follow-up Providers:                Sharon Mt. Danni Leabo MD, MD, Burtis Junes, RN, Tinnie Gens, Technician, Virgia Land, CRNA Referring MD:              Medicines:                Monitored Anesthesia Care Complications:            No immediate complications. Estimated Blood Loss:     Estimated blood loss: none. Procedure:                Pre-Anesthesia Assessment:                           - Prior to the procedure, a History and Physical                            was performed, and patient medications, allergies                            and sensitivities were reviewed. The patient's                            tolerance of previous anesthesia was reviewed.                           - The risks and benefits of the procedure and the                            sedation options and risks were discussed with the                            patient. All questions were answered and informed                            consent was obtained.                           - Patient identification and proposed procedure                            were verified prior to the procedure by the                            physician, the nurse  and the anesthetist. The                            procedure was verified in the pre-procedure area in                            the endoscopy suite.                           - ASA Grade Assessment: II - A patient with mild                            systemic disease.                           After obtaining informed consent, the scope was   passed under direct vision. The [id number]-[id number] ([id number])                            Olympus gastroscope was introduced through the anus                            and advanced to the the splenic flexure. The                            flexible sigmoidoscopy was accomplished without                            difficulty. The patient tolerated the procedure                            well. The quality of the bowel preparation was                            adequate. Scope In: Scope Out: Findings:      The perianal and digital examinations were normal. Pertinent negatives       include normal sphincter tone.      Colorectal anastomosis palpable at approximately 4 cm from anal verge;       patent; smooth; soft rectum; no palpable masses or mucosal nodularity      There was evidence of a prior end-to-end colo-rectal anastomosis in the       distal rectum. This was patent and was characterized by healthy       appearing mucosa; tattoo which was well distal to mass at time of her       surgery was visualized; no evidence of recurrence of her cancer. The       anastomosis was traversed. Estimated blood loss: none. Impression:               - Patent end-to-end colo-rectal anastomosis,                            characterized by healthy appearing mucosa.                           - No specimens collected. Moderate Sedation:  Not Applicable - Patient had care per Anesthesia. Recommendation:           - Resume previous diet. Procedure Code(s):        --- Professional ---                           [phone], Sigmoidoscopy, flexible; diagnostic,                            including collection of specimen(s) by brushing or                            washing, when performed (separate procedure) Diagnosis Code(s):        --- Professional ---                           Z09, Encounter for follow-up examination after                            completed treatment for conditions other than                             malignant neoplasm                           Z85.048, Personal history of other malignant                            neoplasm of rectum, rectosigmoid junction, and anus                           Z98.0, Intestinal bypass and anastomosis status CPT copyright 2018 American Medical Association. All rights reserved. The codes documented in this report are preliminary and upon coder review may  be revised to meet current compliance requirements. Nadeen Landau, MD Ileana Roup MD, MD 12/04/2018 7:50:58 AM This report has been signed electronically. Number of Addenda: 0

## 2018-12-04 NOTE — Transfer of Care (Signed)
Immediate Anesthesia Transfer of Care Note  Patient: Deborah Arroyo  Procedure(s) Performed: FLEXIBLE SIGMOIDOSCOPY (N/A )  Patient Location: PACU  Anesthesia Type:MAC  Level of Consciousness: awake, alert  and oriented  Airway & Oxygen Therapy: Patient Spontanous Breathing and Patient connected to face mask oxygen  Post-op Assessment: Report given to RN and Post -op Vital signs reviewed and stable  Post vital signs: Reviewed and stable  Last Vitals:  Vitals Value Taken Time  BP    Temp    Pulse 72 12/04/2018  7:49 AM  Resp 13 12/04/2018  7:49 AM  SpO2 100 % 12/04/2018  7:49 AM  Vitals shown include unvalidated device data.  Last Pain:  Vitals:   12/04/18 0623  TempSrc: Oral  PainSc:          Complications: No apparent anesthesia complications

## 2018-12-04 NOTE — Anesthesia Preprocedure Evaluation (Signed)
Anesthesia Evaluation  Patient identified by MRN, date of birth, ID band Patient awake    Reviewed: Allergy & Precautions, NPO status , Patient's Chart, lab work & pertinent test results  Airway Mallampati: II  TM Distance: >3 FB Neck ROM: Full    Dental no notable dental hx.    Pulmonary Current Smoker,    Pulmonary exam normal breath sounds clear to auscultation       Cardiovascular negative cardio ROS Normal cardiovascular exam Rhythm:Regular Rate:Normal     Neuro/Psych negative neurological ROS  negative psych ROS   GI/Hepatic negative GI ROS, Neg liver ROS,   Endo/Other  negative endocrine ROS  Renal/GU negative Renal ROS  negative genitourinary   Musculoskeletal negative musculoskeletal ROS (+)   Abdominal   Peds negative pediatric ROS (+)  Hematology negative hematology ROS (+)   Anesthesia Other Findings   Reproductive/Obstetrics negative OB ROS                             Anesthesia Physical Anesthesia Plan  ASA: II  Anesthesia Plan: MAC   Post-op Pain Management:    Induction: Intravenous  PONV Risk Score and Plan: 0  Airway Management Planned: Simple Face Mask  Additional Equipment:   Intra-op Plan:   Post-operative Plan:   Informed Consent: I have reviewed the patients History and Physical, chart, labs and discussed the procedure including the risks, benefits and alternatives for the proposed anesthesia with the patient or authorized representative who has indicated his/her understanding and acceptance.     Dental advisory given  Plan Discussed with: CRNA and Surgeon  Anesthesia Plan Comments:         Anesthesia Quick Evaluation

## 2018-12-04 NOTE — Anesthesia Procedure Notes (Signed)
Procedure Name: MAC Date/Time: 12/04/2018 7:28 AM Performed by: Maxwell Caul, CRNA Pre-anesthesia Checklist: Patient identified, Emergency Drugs available, Suction available and Patient being monitored Oxygen Delivery Method: Simple face mask

## 2018-12-04 NOTE — Anesthesia Postprocedure Evaluation (Signed)
Anesthesia Post Note  Patient: Deborah Arroyo  Procedure(s) Performed: FLEXIBLE SIGMOIDOSCOPY (N/A )     Patient location during evaluation: PACU Anesthesia Type: MAC Level of consciousness: awake and alert Pain management: pain level controlled Vital Signs Assessment: post-procedure vital signs reviewed and stable Respiratory status: spontaneous breathing, nonlabored ventilation, respiratory function stable and patient connected to nasal cannula oxygen Cardiovascular status: stable and blood pressure returned to baseline Postop Assessment: no apparent nausea or vomiting Anesthetic complications: no    Last Vitals:  Vitals:   12/04/18 0755 12/04/18 0800  BP: (!) 102/58 108/69  Pulse:  78  Resp:  (!) 21  Temp:    SpO2:  99%    Last Pain:  Vitals:   12/04/18 0748  TempSrc: Oral  PainSc: 0-No pain                 Jaelan Rasheed S

## 2018-12-04 NOTE — Discharge Instructions (Signed)
Flexible Sigmoidoscopy, Care After  This sheet gives you information about how to care for yourself after your procedure. Your health care provider may also give you more specific instructions. If you have problems or questions, contact your health care provider.  What can I expect after the procedure?  After the procedure, it is common to have:  · Abdominal cramping or pain.  · Bloating.  · A small amount of rectal bleeding if you had a biopsy.  Follow these instructions at home:  · Take over-the-counter and prescription medicines only as told by your health care provider.  · Do not drive for 24 hours if you received a medicine to help you relax (sedative).  · Keep all follow-up visits as told by your health care provider. This is important.  Contact a health care provider if:  · You have abdominal pain or cramping that gets worse or is not helped with medicine.  · You continue to have small amounts of rectal bleeding after 24 hours.  · You have nausea or vomiting.  · You feel weak or dizzy.  · You have a fever.  Get help right away if:  · You pass large blood clots or see a large amount of blood in the toilet after having a bowel movement.  · You have nausea or vomiting for more than 24 hours after the procedure.  This information is not intended to replace advice given to you by your health care provider. Make sure you discuss any questions you have with your health care provider.  Document Released: 10/23/2013 Document Revised: 05/07/2016 Document Reviewed: 01/17/2016  Elsevier Interactive Patient Education © 2019 [employer]

## 2018-12-05 ENCOUNTER — Encounter (HOSPITAL_COMMUNITY): Payer: Self-pay | Admitting: Surgery

## 2018-12-13 DIAGNOSIS — C20 Malignant neoplasm of rectum: Secondary | ICD-10-CM | POA: Diagnosis not present

## 2018-12-15 ENCOUNTER — Telehealth: Payer: Self-pay | Admitting: *Deleted

## 2018-12-15 NOTE — Telephone Encounter (Signed)
Patient called regarding CT scan scheduled on 01/01/2019. Patient states what is this looking for exactly? If its to check to see if cancer has spread she would like to cancel scan. She states she does not care anymore, and would like to get life back to being semi-normal. Per Lattie Haw, NP yes CT scan is to check to see if cancer has spread- patient made aware. Patient states she would like to cancel CT scan.  Patient advise that if she has any other questions/concerns or changes her mind regarding the scan please call office. Patient verbalized understanding. Noted CT scan cancelled.

## 2018-12-19 ENCOUNTER — Ambulatory Visit: Payer: Self-pay | Admitting: Surgery

## 2018-12-19 NOTE — H&P (Signed)
CC: Rectal cancer - f/u  HPI: Deborah Arroyo is a pleasant 79yoF with no known past medical history. For at least the last year she reports having had issues with hematochezia with each bowel movement. She denies whatsoever any abdominal pain, cramping, bloating, nausea, vomiting. She reports having normal bowel movements throughout all this. She has been tolerating a diet well. She was seen by Dr. Hilarie Fredrickson for the hematochezia and taken for colonoscopy on 03/01/18 which was notable for a circumferential partially obstructing mass in the rectum approximately 5 cm from the anal verge that could not be traversed with a EGD scope. Biopsies returned adenocarcinoma. The area distal to this mass was tattooed. She reports issues with incontinence of her feces for at least the last 1-2 years and this has been getting worse over time. Her incontinence is primarily to liquid stool as well as gas. She wears a pad daily. She does not believe this significantly impacts her daily quality of life or changes her activities based on this, but experiences daily accidents. She reports her weight as being stable and has gained 3 lbs in the last month.  She was seen by one of my colleagues in medical oncology, Dr. Benay Spice earlier this week and was quite frustrated with her new diagnosis of cancer as well as the amount of information being delivered to her regarding all of this. She left his office frustrated and declined follow-up the following day with Dr. Lisbeth Renshaw radiation oncology.  She underwent MRI pelvis 03/13/18 which demonstrated a locally advanced rectal cancer- cT3cN2. CT C/A/P 03/03/18 - showed a large rectal mass approximately 6 cm in length 5 cm of anorectal junction with multiple surrounding lymph nodes. Mildly enlarged right hilar lymph node not favored to be metastatic based on the radiology interpretation and other subcentimeter nodules which warrant surveillance  She completed Xeloda + XRT 05/11/18. She  tolerated this reasonably well. Since completing this, she reports improvement in some of her symptoms. She still does have some incontinent episodes to primarily liquid stool and accidents occurring once a week. She does occasionally also have incontinence to gas. She denies incontinence to solid stool. She does not wear a pad or diaper.  She completed her colonoscopy with Dr. Hilarie Fredrickson preoperatively which demonstrated a nonobstructing mass in the proximal rectum that he measured 8 cm from the dentate. No bleeding was present. Distal to the mass tattoo was noted. There is additionally a 10 mm polyp in hepatic flexure that was removed piecemeal. Finally a polyp was found in descending colon and removed. Pathology revealed TAs  OR 07/12/18 - laparoscopic low anterior resection with coloproctostomy (anastomosis 4.5 cm from anal verge), diverting loop ileostomy, flexible sigmoidoscopy, cysto/stents. She recovered well postoperatively. She did initially have issues with high stoma output which kept her in the hospital a couple extra days. She had a small notable parastomal hernia at the time as well. She was discharged home. She returned to the emergency room 9/26 with a leaking stoma and one episode nausea/vomiting previously. She was seen in emergency room and noted to have a leukocytosis. She had no fevers. She had no abdominal pain. She was tolerating a diet without nausea or vomiting at that time. Her stoma was working. She underwent evaluation with a CT of the head as well as A/P which were largely unremarkable-she did have a small parastomal hernia but her ileostomy was productive and contrast was noted in the bag. Her chest x-ray demonstrated no evidence of pneumonia. Her urinalysis demonstrated  no UTI. She was subsequently discharged back home  Pathology returned invasive moderately differentiated colorectal adenocarcinoma 2.5 cm extending in the perirectal connective tissue, margins  negative, 0 of 11 lymph nodes, separate tubular adenoma 0.5 cm. LVI: No PNI: No  [id number]  Her case was presented at our multispecialty disparity report and the consensus was for her to proceed with adjuvant chemotherapy given her pretreatment and preoperative staging. She declined infusional agents and is therefore being given adjuvant capecitabine beginning 08/07/18  Gastrografin enema-11/08/2018-no leak at the colorectal anastomosis, patent. Flexible sigmoidoscopy 12/04/2018-demonstrated a patent end-to-end colorectal anastomosis with healthy-appearing mucosa.   INTERVAL HX She completed adjuvant chemotherapy 11/12/2018. She reports having tolerated this well. She is doing well today. She denies any complaints. She is tolerating a diet and has appropriate ostomy output. No major appliance issues at this time. She is not currently taking antidiarrheals.   PMH: Denies  PSH: Open appendectomy - RLQ Laparoscopic cholecystectomy BTL-low midline incision Hysterectomy-TVH-denies prior radiation but this was done for cervical cancer Shoulder surgery Eye surgery  OB/Gyn: G4P3, all vaginal deliveries  FHx: Brother-prostate ca Mom-uterine ca Sister-gastric and ovarian ca Brother-lung cancer Daughter-breast ca  Social: Denies use of tobacco-one to one and a half packs per day for the last 40-42 years; denies use of alcohol or drugs. She is currently retired-has held numerous prior jobs, the most recent being it [employer]  ROS: A comprehensive 10 system review of systems was completed with the patient and pertinent findings as noted above.  The patient is a 68 year old female.   Allergies (Jacqueline Haggett, RMA; 12/13/2018 9:18 AM) Aleve *ANALGESICS - ANTI-INFLAMMATORY*  Urticaria, Swelling. Allergies Reconciled   Medication History [employer], RMA; 12/13/2018 9:18 AM) Imodium (2MG  Capsule, Oral) Active. Ferrous Sulfate (325 (65 Fe)MG Tablet, Oral)  Active. Fiber (Oral) Active. Diphenoxylate-Atropine (2.5-0.025MG  Tablet, Oral) Active. Xeloda (500MG  Tablet, Oral three times daily) Active. Medications Reconciled    Review of Systems Harrell Gave M. Luretha Eberly MD; 12/13/2018 9:32 AM) General Not Present- Appetite Loss, Chills, Fatigue, Fever, Night Sweats, Weight Gain and Weight Loss. Skin Not Present- Change in Wart/Mole, Dryness, Hives, Jaundice, New Lesions, Non-Healing Wounds, Rash and Ulcer. Respiratory Not Present- Decreased Exercise Tolerance and Difficulty Breathing. Breast Not Present- Breast Mass, Breast Pain, Nipple Discharge and Skin Changes. Gastrointestinal Not Present- Abdominal Pain, Bloating, Bloody Stool, Change in Bowel Habits, Chronic diarrhea, Constipation, Excessive gas, Hemorrhoids, Indigestion, Nausea, Rectal Pain and Vomiting. Female Genitourinary Not Present- Frequency, Nocturia, Painful Urination, Pelvic Pain and Urgency. Musculoskeletal Present- Back Pain. Not Present- Joint Pain, Joint Stiffness, Muscle Pain, Muscle Weakness and Swelling of Extremities. Neurological Not Present- Decreased Memory, Fainting, Headaches, Numbness, Seizures, Tingling, Tremor, Trouble walking and Weakness. Endocrine Not Present- Cold Intolerance, Excessive Hunger, Hair Changes, Heat Intolerance, Hot flashes and New Diabetes. Hematology Not Present- Blood Thinners, Easy Bruising, Excessive bleeding, Gland problems, HIV and Persistent Infections.  Vitals [employer] Haggett RMA; 12/13/2018 9:19 AM) 12/13/2018 9:18 AM Weight: 101.6 lb Height: 61in Body Surface Area: 1.42 m Body Mass Index: 19.2 kg/m  Temp.: 97.35F(Temporal)  Pulse: 96 (Regular)  P.OX: 92% (Room air) BP: 104/68 (Sitting, Left Arm, Standard)       Physical Exam Harrell Gave M. Lilliemae Fruge MD; 12/13/2018 9:33 AM) The physical exam findings are as follows: Note:Constitutional: No acute distress; conversant; no deformities Eyes: Moist conjunctiva; no lid lag;  anicteric sclerae; pupils equal round and reactive to light Neck: Trachea midline; no palpable thyromegaly Lungs: Normal respiratory effort; no tactile fremitus CV: RRR; no palpable thrill;  no pitting edema GI: Abdomen soft, nontender, nondistended; no palpable hepatosplenomegaly. Loop ileostomy in place with soft reducible parastomal hernia - ileostomy protruding nicely; pink; dark green output in appliance. MSK: Normal gait; no clubbing/cyanosis Psychiatric: Appropriate affect; alert and oriented 3 Lymphatic: No palpable cervical or axillary lymphadenopathy    Assessment & Plan Harrell Gave M. Iolani Twilley MD; 12/13/2018 9:35 AM) RECTAL CANCER (C20) Story: Ms. Letson is a pleasant 59yoF with 29yr hx of mid rectal cancer - 5cm from anal verge; CEA 1.9; no evidence of metastatic disease on CT C/A/P; MRI-P shows this to be cT3cN2 - s/p Xeloda+XRT - completed 05/11/18  S/P Laparoscopic LAR with coloproctostomy and DLI 07/12/18 Pathology: [id number] Completed adjuvant capecitabine - 08/07/18 - 11/12/2018 GGE- normal appearing anastomosis Flex sig- normal appearing anastomosis Impression: -She is scheduled to undergo surveillance CT scans 01/01/2019 -The anatomy and physiology of the GI tract was discussed at length with the patient today. The pathophysiology and technical aspects of ileostomy reversal was discussed at length with associated pictures as well. -We discussed takedown of loop ileostomy. We discussed possible additional laparoscopic as well as open approaches to this procedure. -The planned procedure, material risks (including, but not limited to, pain, bleeding, infection, scarring, need for blood transfusion, damage to surrounding structures- blood vessels/nerves/viscus/organs, leak from anastomosis, need for additional procedures/surgeries, need for stoma which may be permanent, hernia, recurrence, pneumonia, heart attack, stroke, death) benefits and alternatives to surgery were discussed at  length. The patient's questions were answered to her satisfaction, she voiced understanding and elected to proceed with surgery. Additionally, we discussed typical postoperative expectations and the recovery process.  Signed electronically by Ileana Roup, MD (12/13/2018 9:35 AM)

## 2018-12-26 DIAGNOSIS — C20 Malignant neoplasm of rectum: Secondary | ICD-10-CM | POA: Diagnosis not present

## 2019-01-01 ENCOUNTER — Other Ambulatory Visit: Payer: Self-pay | Admitting: Nurse Practitioner

## 2019-01-01 ENCOUNTER — Ambulatory Visit (HOSPITAL_COMMUNITY): Payer: Medicare Other

## 2019-01-01 DIAGNOSIS — C20 Malignant neoplasm of rectum: Secondary | ICD-10-CM

## 2019-01-04 ENCOUNTER — Ambulatory Visit (HOSPITAL_COMMUNITY)
Admission: RE | Admit: 2019-01-04 | Discharge: 2019-01-04 | Disposition: A | Discharge status: Home / Self Care | Payer: Medicare Other | Source: Ambulatory Visit | Attending: Nurse Practitioner | Admitting: Nurse Practitioner

## 2019-01-04 ENCOUNTER — Inpatient Hospital Stay: Payer: Medicare Other | Attending: Oncology

## 2019-01-04 DIAGNOSIS — C20 Malignant neoplasm of rectum: Secondary | ICD-10-CM | POA: Insufficient documentation

## 2019-01-04 DIAGNOSIS — C218 Malignant neoplasm of overlapping sites of rectum, anus and anal canal: Secondary | ICD-10-CM | POA: Diagnosis not present

## 2019-01-04 DIAGNOSIS — J439 Emphysema, unspecified: Secondary | ICD-10-CM | POA: Diagnosis not present

## 2019-01-04 LAB — CEA (IN HOUSE-CHCC): CEA (CHCC-In House): 3.6 ng/mL (ref 0.00–5.00)

## 2019-01-04 LAB — BASIC METABOLIC PANEL
Anion gap: 9 (ref 5–15)
BUN: 44 mg/dL — AB (ref 8–23)
CO2: 23 mmol/L (ref 22–32)
CREATININE: 1.13 mg/dL — AB (ref 0.44–1.00)
Calcium: 9.7 mg/dL (ref 8.9–10.3)
Chloride: 100 mmol/L (ref 98–111)
GFR calc Af Amer: 58 mL/min — ABNORMAL LOW (ref >60)
GFR calc non Af Amer: 50 mL/min — ABNORMAL LOW (ref >60)
Glucose, Bld: 92 mg/dL (ref 70–99)
Potassium: 4.1 mmol/L (ref 3.5–5.1)
Sodium: 132 mmol/L — ABNORMAL LOW (ref 135–145)

## 2019-01-04 MED ORDER — IOHEXOL 300 MG/ML  SOLN
100.0000 mL | Freq: Once | INTRAMUSCULAR | Status: AC | PRN
  Administered 2019-01-04: 100 mL via INTRAVENOUS

## 2019-01-04 MED ORDER — SODIUM CHLORIDE (PF) 0.9 % IJ SOLN
INTRAMUSCULAR | Status: AC
  Filled 2019-01-04: qty 50

## 2019-01-05 ENCOUNTER — Telehealth: Payer: Self-pay | Admitting: *Deleted

## 2019-01-05 NOTE — Telephone Encounter (Signed)
-----   Message from Ladell Pier, MD sent at 01/05/2019  7:30 AM EST ----- Please call patient, CTs are negative for cancer

## 2019-01-05 NOTE — Telephone Encounter (Signed)
  tct patient regarding results of CT scan. Spoke with patient. Per Dr. Benay Spice - no evidence of cancer. CEA also normal from yesterday.  Pt aware of f/u appts with Dr. Benay Spice in April.  Pt states she may need to reschedule as she is having surgery April 8th for reversal of her ileostomy.  Advised pt just to call if she needs to change appt day.  She voiced understanding of results and very pleased. No further questions or concerns.

## 2019-01-05 NOTE — Telephone Encounter (Signed)
Patient left VM requesting recent labs be mailed to her home. Called back and left VM requesting she call back to confirm correct mailing address.

## 2019-01-08 ENCOUNTER — Telehealth: Payer: Self-pay

## 2019-01-08 NOTE — Telephone Encounter (Signed)
Returned patient call, verified patient correct address, and placed in mail copy of labs and CT scans as requested. No other questions or concerns.

## 2019-01-12 DIAGNOSIS — Z7189 Other specified counseling: Secondary | ICD-10-CM | POA: Diagnosis not present

## 2019-01-23 NOTE — Progress Notes (Signed)
06-28-18 (Epic) EKG  07-27-18 (Epic) CXR

## 2019-01-23 NOTE — Patient Instructions (Addendum)
Deborah Arroyo  01/23/2019   Your procedure is scheduled on: 02-07-19    Report to [facility] Main  Entrance    Report to Admitting at 6:30 AM    Call this number if you have problems the morning of surgery [phone]    Remember: DRINK 2 PRESURGERY ENSURE DRINKS THE NIGHT BEFORE SURGERY AT  1000 PM AND 1 PRESURGERY DRINK THE DAY OF THE PROCEDURE.  NO SOLIDS AFTER MIDNIGHT THE DAY PRIOR TO THE SURGERY. NOTHING BY MOUTH EXCEPT CLEAR LIQUIDS UNTIL THREE HOURS PRIOR TO SCHEDULED SURGERY. PLEASE FINISH PRESURGERY ENSURE DRINK  WHICH NEEDS TO BE COMPLETED AT 4:30 AM.    CLEAR LIQUID DIET   Foods Allowed                                                                     Foods Excluded  Coffee and tea, regular and decaf                             liquids that you cannot  Plain Jell-O in any flavor                                             see through such as: Fruit ices (not with fruit pulp)                                     milk, soups, orange juice  Iced Popsicles                                    All solid food Carbonated beverages, regular and diet                                    Cranberry, grape and apple juices Sports drinks like Gatorade Lightly seasoned clear broth or consume(fat free) Sugar, honey syrup  Sample Menu Breakfast                                Lunch                                     Supper Cranberry juice                    Beef broth                            Chicken broth Jell-O  Grape juice                           Apple juice Coffee or tea                        Jell-O                                      Popsicle                                                Coffee or tea                        Coffee or tea  _____________________________________________________________________       BRUSH YOUR TEETH MORNING OF SURGERY AND RINSE YOUR MOUTH OUT, NO CHEWING GUM CANDY OR  MINTS.     Take these medicines the morning of surgery with A SIP OF WATER: None                                 You may not have any metal on your body including hair pins and              piercings  Do not wear jewelry, cologne, lotions, powders or deodorant             Do not wear nail polish.  Do not shave  48 hours prior to surgery.               Do not bring valuables to the hospital. [place].  Contacts, dentures or bridgework may not be worn into surgery.      Patients discharged the day of surgery will not be allowed to drive home. IF YOU ARE HAVING SURGERY AND GOING HOME THE SAME DAY, YOU MUST HAVE AN ADULT TO DRIVE YOU HOME AND BE WITH YOU FOR 24 HOURS. YOU MAY GO HOME BY TAXI OR UBER OR ORTHERWISE, BUT AN ADULT MUST ACCOMPANY YOU HOME AND STAY WITH YOU FOR 24 HOURS.    Name and phone number of your driver:   Special Instructions: Please consume a Clear Liquid Diet with your prep              Please read over the following fact sheets you were given: _____________________________________________________________________             [facility] - Preparing for Surgery Before surgery, you can play an important role.  Because skin is not sterile, your skin needs to be as free of germs as possible.  You can reduce the number of germs on your skin by washing with CHG (chlorahexidine gluconate) soap before surgery.  CHG is an antiseptic cleaner which kills germs and bonds with the skin to continue killing germs even after washing. Please DO NOT use if you have an allergy to CHG or antibacterial soaps.  If your skin becomes reddened/irritated stop using the CHG and inform your nurse when you arrive at Short Stay. Do not shave (  including legs and underarms) for at least 48 hours prior to the first CHG shower.  You may shave your face/neck. Please follow these instructions carefully:  1.  Shower with CHG Soap the night before surgery  and the  morning of Surgery.  2.  If you choose to wash your hair, wash your hair first as usual with your  normal  shampoo.  3.  After you shampoo, rinse your hair and body thoroughly to remove the  shampoo.                           4.  Use CHG as you would any other liquid soap.  You can apply chg directly  to the skin and wash                       Gently with a scrungie or clean washcloth.  5.  Apply the CHG Soap to your body ONLY FROM THE NECK DOWN.   Do not use on face/ open                           Wound or open sores. Avoid contact with eyes, ears mouth and genitals (private parts).                       Wash face,  Genitals (private parts) with your normal soap.             6.  Wash thoroughly, paying special attention to the area where your surgery  will be performed.  7.  Thoroughly rinse your body with warm water from the neck down.  8.  DO NOT shower/wash with your normal soap after using and rinsing off  the CHG Soap.                9.  Pat yourself dry with a clean towel.            10.  Wear clean pajamas.            11.  Place clean sheets on your bed the night of your first shower and do not  sleep with pets. Day of Surgery : Do not apply any lotions/deodorants the morning of surgery.  Please wear clean clothes to the hospital/surgery center.  FAILURE TO FOLLOW THESE INSTRUCTIONS MAY RESULT IN THE CANCELLATION OF YOUR SURGERY PATIENT SIGNATURE_________________________________  NURSE SIGNATURE__________________________________  ________________________________________________________________________  WHAT IS A BLOOD TRANSFUSION? Blood Transfusion Information  A transfusion is the replacement of blood or some of its parts. Blood is made up of multiple cells which provide different functions.  Red blood cells carry oxygen and are used for blood loss replacement.  White blood cells fight against infection.  Platelets control bleeding.  Plasma helps clot  blood.  Other blood products are available for specialized needs, such as hemophilia or other clotting disorders. BEFORE THE TRANSFUSION  Who gives blood for transfusions?   Healthy volunteers who are fully evaluated to make sure their blood is safe. This is blood bank blood. Transfusion therapy is the safest it has ever been in the practice of medicine. Before blood is taken from a donor, a complete history is taken to make sure that person has no history of diseases nor engages in risky social behavior (examples are intravenous drug use or sexual activity with multiple partners). The donor's travel history is screened  to minimize risk of transmitting infections, such as malaria. The donated blood is tested for signs of infectious diseases, such as HIV and hepatitis. The blood is then tested to be sure it is compatible with you in order to minimize the chance of a transfusion reaction. If you or a relative donates blood, this is often done in anticipation of surgery and is not appropriate for emergency situations. It takes many days to process the donated blood. RISKS AND COMPLICATIONS Although transfusion therapy is very safe and saves many lives, the main dangers of transfusion include:   Getting an infectious disease.  Developing a transfusion reaction. This is an allergic reaction to something in the blood you were given. Every precaution is taken to prevent this. The decision to have a blood transfusion has been considered carefully by your caregiver before blood is given. Blood is not given unless the benefits outweigh the risks. AFTER THE TRANSFUSION  Right after receiving a blood transfusion, you will usually feel much better and more energetic. This is especially true if your red blood cells have gotten low (anemic). The transfusion raises the level of the red blood cells which carry oxygen, and this usually causes an energy increase.  The nurse administering the transfusion will monitor  you carefully for complications. HOME CARE INSTRUCTIONS  No special instructions are needed after a transfusion. You may find your energy is better. Speak with your caregiver about any limitations on activity for underlying diseases you may have. SEEK MEDICAL CARE IF:   Your condition is not improving after your transfusion.  You develop redness or irritation at the intravenous (IV) site. SEEK IMMEDIATE MEDICAL CARE IF:  Any of the following symptoms occur over the next 12 hours:  Shaking chills.  You have a temperature by mouth above 102 F (38.9 C), not controlled by medicine.  Chest, back, or muscle pain.  People around you feel you are not acting correctly or are confused.  Shortness of breath or difficulty breathing.  Dizziness and fainting.  You get a rash or develop hives.  You have a decrease in urine output.  Your urine turns a dark color or changes to pink, red, or brown. Any of the following symptoms occur over the next 10 days:  You have a temperature by mouth above 102 F (38.9 C), not controlled by medicine.  Shortness of breath.  Weakness after normal activity.  The white part of the eye turns yellow (jaundice).  You have a decrease in the amount of urine or are urinating less often.  Your urine turns a dark color or changes to pink, red, or brown. Document Released: 10/15/2000 Document Revised: 01/10/2012 Document Reviewed: 06/03/2008 [facility] Patient Information 2014 [place], [place].  _______________________________________________________________________

## 2019-01-24 ENCOUNTER — Other Ambulatory Visit: Payer: Self-pay

## 2019-01-24 ENCOUNTER — Encounter (HOSPITAL_COMMUNITY)
Admission: RE | Admit: 2019-01-24 | Discharge: 2019-01-24 | Disposition: A | Discharge status: Home / Self Care | Payer: Medicare Other | Source: Ambulatory Visit | Attending: Surgery | Admitting: Surgery

## 2019-01-24 ENCOUNTER — Encounter (HOSPITAL_COMMUNITY): Payer: Self-pay

## 2019-01-24 DIAGNOSIS — Z01812 Encounter for preprocedural laboratory examination: Secondary | ICD-10-CM | POA: Insufficient documentation

## 2019-01-24 LAB — CBC WITH DIFFERENTIAL/PLATELET
Abs Immature Granulocytes: 0.02 10*3/uL (ref 0.00–0.07)
Basophils Absolute: 0.1 10*3/uL (ref 0.0–0.1)
Basophils Relative: 1 %
Eosinophils Absolute: 0.1 10*3/uL (ref 0.0–0.5)
Eosinophils Relative: 1 %
HCT: 46.2 % — ABNORMAL HIGH (ref 36.0–46.0)
Hemoglobin: 15.3 g/dL — ABNORMAL HIGH (ref 12.0–15.0)
Immature Granulocytes: 0 %
Lymphocytes Relative: 10 %
Lymphs Abs: 0.9 10*3/uL (ref 0.7–4.0)
MCH: 34.2 pg — ABNORMAL HIGH (ref 26.0–34.0)
MCHC: 33.1 g/dL (ref 30.0–36.0)
MCV: 103.1 fL — ABNORMAL HIGH (ref 80.0–100.0)
MONOS PCT: 6 %
Monocytes Absolute: 0.6 10*3/uL (ref 0.1–1.0)
Neutro Abs: 7.3 10*3/uL (ref 1.7–7.7)
Neutrophils Relative %: 82 %
PLATELETS: 380 10*3/uL (ref 150–400)
RBC: 4.48 MIL/uL (ref 3.87–5.11)
RDW: 13.2 % (ref 11.5–15.5)
WBC: 8.8 10*3/uL (ref 4.0–10.5)
nRBC: 0 % (ref 0.0–0.2)

## 2019-01-24 LAB — COMPREHENSIVE METABOLIC PANEL
ALT: 22 U/L (ref 0–44)
AST: 25 U/L (ref 15–41)
Albumin: 4.3 g/dL (ref 3.5–5.0)
Alkaline Phosphatase: 117 U/L (ref 38–126)
Anion gap: 11 (ref 5–15)
BUN: 22 mg/dL (ref 8–23)
CHLORIDE: 100 mmol/L (ref 98–111)
CO2: 22 mmol/L (ref 22–32)
Calcium: 9.8 mg/dL (ref 8.9–10.3)
Creatinine, Ser: 1.39 mg/dL — ABNORMAL HIGH (ref 0.44–1.00)
GFR calc Af Amer: 45 mL/min — ABNORMAL LOW (ref >60)
GFR calc non Af Amer: 39 mL/min — ABNORMAL LOW (ref >60)
Glucose, Bld: 103 mg/dL — ABNORMAL HIGH (ref 70–99)
Potassium: 3.7 mmol/L (ref 3.5–5.1)
Sodium: 133 mmol/L — ABNORMAL LOW (ref 135–145)
Total Bilirubin: 0.3 mg/dL (ref 0.3–1.2)
Total Protein: 8.6 g/dL — ABNORMAL HIGH (ref 6.5–8.1)

## 2019-01-24 LAB — TYPE AND SCREEN
ABO/RH(D): A POS
Antibody Screen: NEGATIVE

## 2019-01-24 LAB — PROTIME-INR
INR: 0.8 (ref 0.8–1.2)
Prothrombin Time: 11.4 seconds (ref 11.4–15.2)

## 2019-01-24 LAB — APTT: aPTT: 36 seconds (ref 24–36)

## 2019-01-24 NOTE — Progress Notes (Signed)
01-24-19 CMP result routed to Dr. Dema Severin for review

## 2019-01-25 NOTE — Anesthesia Preprocedure Evaluation (Addendum)
Anesthesia Evaluation  Patient identified by MRN, date of birth, ID band Patient awake    Reviewed: Allergy & Precautions, NPO status , Patient's Chart, lab work & pertinent test results  Airway Mallampati: II  TM Distance: >3 FB     Dental  (+) Dental Advisory Given   Pulmonary shortness of breath, Current Smoker,    breath sounds clear to auscultation       Cardiovascular negative cardio ROS   Rhythm:Regular Rate:Normal     Neuro/Psych  Headaches,    GI/Hepatic Neg liver ROS, Cervical and rectal CA s/p resection now for reversal   Endo/Other  Morbid obesity  Renal/GU negative Renal ROS     Musculoskeletal   Abdominal   Peds  Hematology negative hematology ROS (+)   Anesthesia Other Findings   Reproductive/Obstetrics                           Lab Results  Component Value Date   WBC 10.7 (H) 04/17/2019   HGB 13.4 04/17/2019   HCT 41.9 04/17/2019   MCV 97.9 04/17/2019   PLT 382 04/17/2019   Lab Results  Component Value Date   CREATININE 1.37 (H) 04/17/2019   BUN 21 04/17/2019   NA 135 04/17/2019   K 4.5 04/17/2019   CL 102 04/17/2019   CO2 22 04/17/2019    Anesthesia Physical Anesthesia Plan  ASA: III  Anesthesia Plan: General   Post-op Pain Management:    Induction: Intravenous  PONV Risk Score and Plan: 2 and Dexamethasone, Ondansetron and Treatment may vary due to age or medical condition  Airway Management Planned: Oral ETT  Additional Equipment:   Intra-op Plan:   Post-operative Plan: Extubation in OR  Informed Consent: I have reviewed the patients History and Physical, chart, labs and discussed the procedure including the risks, benefits and alternatives for the proposed anesthesia with the patient or authorized representative who has indicated his/her understanding and acceptance.     Dental advisory given  Plan Discussed with: CRNA  Anesthesia Plan  Comments:       Anesthesia Quick Evaluation

## 2019-01-25 NOTE — Progress Notes (Signed)
Anesthesia Chart Review   Case:  [id number] Date/Time:  02/07/19 0815   Procedure:  ILEOSTOMY REVERSAL (N/A )   Anesthesia type:  General   Pre-op diagnosis:  ILEOSTOMY STATUS, HISTORY OF RECTAL CANCER   Location:  [facility] ROOM 01 / [facility]   Surgeon:  Ileana Roup, MD      DISCUSSION: 68 yo current every day smoker with h/o depression, h/o cervical cancer, rectal cancer (s/p resection 07/12/18, completed adjuvant chemo 11/12/18) scheduled for above procedure 02/07/19 with Dr. Nadeen Landau.   Pt can proceed with planned procedure barring acute status change.  VS: BP 123/81 (BP Location: Right Arm)   Pulse 96   Temp 36.6 C (Oral)   Resp 18   Ht 5\' 1"  (1.549 m)   Wt 44.6 kg   SpO2 99%   BMI 18.58 kg/m   PROVIDERS: Modena Nunnery D, DO is PCP    LABS: Labs reviewed: Acceptable for surgery. (all labs ordered are listed, but only abnormal results are displayed)  Labs Reviewed  CBC WITH DIFFERENTIAL/PLATELET - Abnormal; Notable for the following components:      Result Value   Hemoglobin 15.3 (*)    HCT 46.2 (*)    MCV 103.1 (*)    MCH 34.2 (*)    All other components within normal limits  COMPREHENSIVE METABOLIC PANEL - Abnormal; Notable for the following components:   Sodium 133 (*)    Glucose, Bld 103 (*)    Creatinine, Ser 1.39 (*)    Total Protein 8.6 (*)    GFR calc non Af Amer 39 (*)    GFR calc Af Amer 45 (*)    All other components within normal limits  APTT  PROTIME-INR  TYPE AND SCREEN     IMAGES: CT Abdomen Pelvis 01/04/19 IMPRESSION: 1. Low anterior section with a right lower quadrant ileostomy. 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 No evidence of metastatic disease. 2. Hepatic steatosis. 3.  Aortic atherosclerosis (ICD10-170.0). 4.  Emphysema (ICD10-J43.9).  EKG: 06/28/18 Rate 63 bpm Normal sinus rhythm  Low voltage QRS Borderline ECG  CV:  Past Medical History:  Diagnosis Date  . Cancer ([place]) 03/01/2018    rectal cancer=had chemo pills and radation   . Cataract   . Cervix cancer ([place]) 1979  . Depression   . Dyspnea   . Hypoglycemia   . Hyponatremia    syncope with this and admitted twice due to this issue   . Osteopenia   . Rectal bleeding   . Smoker     Past Surgical History:  Procedure Laterality Date  . ABDOMINAL HYSTERECTOMY    . APPENDECTOMY    . BIOPSY  07/04/2018   Procedure: BIOPSY;  Surgeon: Jerene Bears, MD;  Location: [facility];  Service: Gastroenterology;;  . CHOLECYSTECTOMY    . COLONOSCOPY  03/01/2018   Dr.Pyrtle  . COLONOSCOPY WITH PROPOFOL N/A 07/04/2018   Procedure: COLONOSCOPY WITH PROPOFOL;  Surgeon: Jerene Bears, MD;  [facility];  Service: Gastroenterology;  Laterality: N/A;  . CYSTOSCOPY WITH STENT PLACEMENT Bilateral 07/12/2018   Procedure: CYSTOSCOPY WITH STENT PLACEMENT;  Surgeon: Lucas Mallow, MD;  [facility];  Service: Urology;  Laterality: Bilateral;  . DIVERTING ILEOSTOMY N/A 07/12/2018   Procedure: DIVERTING LOOP ILEOSTOMY;  Surgeon: Ileana Roup, MD;  [facility];  Service: General;  Laterality: N/A;  . EYE SURGERY    . FLEXIBLE SIGMOIDOSCOPY N/A 07/12/2018   Procedure: FLEXIBLE SIGMOIDOSCOPY;  Surgeon:  Ileana Roup, MD;  [facility];  Service: General;  Laterality: N/A;  . FLEXIBLE SIGMOIDOSCOPY N/A 12/04/2018   Procedure: Beryle Quant;  Surgeon: Ileana Roup, MD;  Location: [facility];  Service: General;  Laterality: N/A;  . LAPAROSCOPIC LOW ANTERIOR RESECTION N/A 07/12/2018   Procedure: LAPAROSCOPIC LOW ANTERIOR RESECTION WITH COLOPROSTOSTOMY, ERAS PATHWAY;  Surgeon: Ileana Roup, MD;  [facility];  Service: General;  Laterality: N/A;  . POLYPECTOMY  07/04/2018   Procedure: POLYPECTOMY;  Surgeon: Jerene Bears, MD;  [facility];  Service: Gastroenterology;;  . SHOULDER SURGERY Left    Rotary Cuff Tear  . TUBAL LIGATION      MEDICATIONS: . acetaminophen  (TYLENOL) 500 MG tablet  . cyclobenzaprine (FLEXERIL) 10 MG tablet  . diphenoxylate-atropine (LOMOTIL) 2.5-0.025 MG tablet  . ferrous sulfate 325 (65 FE) MG tablet   No current facility-administered medications for this encounter.     Maia Plan [facility] [phone] 01/25/19 11:22 AM

## 2019-01-29 DIAGNOSIS — D124 Benign neoplasm of descending colon: Secondary | ICD-10-CM | POA: Diagnosis not present

## 2019-01-29 DIAGNOSIS — D123 Benign neoplasm of transverse colon: Secondary | ICD-10-CM | POA: Diagnosis not present

## 2019-01-31 ENCOUNTER — Telehealth: Payer: Self-pay | Admitting: Internal Medicine

## 2019-01-31 ENCOUNTER — Telehealth: Payer: Self-pay

## 2019-01-31 DIAGNOSIS — M85859 Other specified disorders of bone density and structure, unspecified thigh: Secondary | ICD-10-CM

## 2019-01-31 NOTE — Telephone Encounter (Signed)
Pt is requesting a nurse call [phone]

## 2019-01-31 NOTE — Telephone Encounter (Signed)
TC from [name] inquiring about her appointment on 02/13/19 she stated that her reversal surgery was postponed till May and wanted to know if she should still see Dr. Benay Spice for her appointment. Per Dr. Benay Spice it's ok to reschedule appointment to June. Pt. Verbalized understanding message sent to scheduling, Pt. Informed she would get a call from scheduling to confirm appointment.

## 2019-01-31 NOTE — Telephone Encounter (Signed)
Returned pt's call- stated [facility] told her she needs a bone density test ordered by her doctor d/t osteopenia and her last one was over 2 yrs ago.  Please advise Thanks

## 2019-02-01 ENCOUNTER — Telehealth: Payer: Self-pay | Admitting: Oncology

## 2019-02-01 NOTE — Telephone Encounter (Signed)
R/s appt per 3/31 sch message - pt is aware of new appt date and time

## 2019-02-01 NOTE — Telephone Encounter (Signed)
Dr Koleen Distance can u change location [facility]? Thanks

## 2019-02-01 NOTE — Telephone Encounter (Signed)
Bone density has been ordered. Thanks!

## 2019-02-02 NOTE — Telephone Encounter (Signed)
Bone density scan order has been changed to [facility] - called pt , no answer, left message for pt to call me back.

## 2019-02-02 NOTE — Telephone Encounter (Signed)
Return call from pt - informed order has been changed to [facility] as location.

## 2019-02-06 ENCOUNTER — Encounter: Payer: Self-pay | Admitting: General Practice

## 2019-02-06 NOTE — Progress Notes (Signed)
[facility] Team contacted patient to assess for food insecurity and other psychosocial needs during current COVID19 pandemic.  "A little sore, ostomy bag is leaking sometimes."  Has called Dr Orest Dikes office several times, was sent to ostomy "place in [facility]", had some suggestions but did not resolve issue.  Patient began using triple antibiotic cream on her own for "pain."  This has reduced soreness.  Lives w sons and grandson.  Goes out to stores and grocery store w mask and gloves on infrequently.     Beverely Pace, [place]

## 2019-02-13 ENCOUNTER — Other Ambulatory Visit: Payer: Medicare Other

## 2019-02-13 ENCOUNTER — Ambulatory Visit: Payer: Medicare Other | Admitting: Oncology

## 2019-02-26 ENCOUNTER — Telehealth: Payer: Self-pay | Admitting: Oncology

## 2019-02-26 NOTE — Telephone Encounter (Signed)
Spoke with patient re 7/14 lab/fu.

## 2019-04-03 ENCOUNTER — Telehealth: Payer: Self-pay | Admitting: Internal Medicine

## 2019-04-03 NOTE — Telephone Encounter (Signed)
Deborah Arroyo from [facility] needs a referral for pt have a bone destiny test tomorrw pls call [phone] or fax request to [phone]

## 2019-04-03 NOTE — Telephone Encounter (Signed)
Bone density order faxed to [facility] - fax# [phone].

## 2019-04-05 DIAGNOSIS — Z1231 Encounter for screening mammogram for malignant neoplasm of breast: Secondary | ICD-10-CM | POA: Diagnosis not present

## 2019-04-05 DIAGNOSIS — C20 Malignant neoplasm of rectum: Secondary | ICD-10-CM | POA: Diagnosis not present

## 2019-04-05 DIAGNOSIS — M810 Age-related osteoporosis without current pathological fracture: Secondary | ICD-10-CM | POA: Diagnosis not present

## 2019-04-05 DIAGNOSIS — Z803 Family history of malignant neoplasm of breast: Secondary | ICD-10-CM | POA: Diagnosis not present

## 2019-04-16 ENCOUNTER — Ambulatory Visit: Payer: Self-pay | Admitting: Surgery

## 2019-04-16 NOTE — Progress Notes (Signed)
EKG 06-28-18 Epic CHEST CT 01-04-19 EPIC

## 2019-04-16 NOTE — Patient Instructions (Addendum)
Deborah Arroyo    Your procedure is scheduled on: 04-20-2019   Report to [facility] Main  Entrance    Report to short stay at [facility] TEST ON 04-17-19 @ 2;00 PM_______, THIS TEST MUST BE DONE BEFORE SURGERY, COME TO [place]. ONCE YOUR COVID TEST IS COMPLETED, PLEASE BEGIN THE QUARANTINE INSTRUCTIONS AS OUTLINED IN YOUR HANDOUT.   Call this number if you have problems the morning of surgery [phone]    Remember: DRINK 2 [place] AT 1000 PM AND 1 PRESURGERY DRINK THE DAY OF THE PROCEDURE 3 HOURS PRIOR TO SCHEDULED SURGERY.   THE DAY PRIOR TO THE SURGERY CLEAR LIQUIDS . NOTHING BY MOUTH EXCEPT CLEAR LIQUIDS UNTIL THREE HOURS PRIOR TO SCHEDULED SURGERY. PLEASE FINISH PRESURGERY ENSURE DRINK PER SURGEON ORDER 3 HOURS PRIOR TO SCHEDULED SURGERY TIME WHICH NEEDS TO BE COMPLETED AT 430 AM.    FOLLOW ALL BOWEL PREP INSTRUCTIONS FROM DR WHITE.  CLEAR LIQUID DIET   Foods Allowed                                                                     Foods Excluded  Coffee and tea, regular and decaf                             liquids that you cannot  Plain Jell-O in any flavor                                             see through such as: Fruit ices (not with fruit pulp)                                     milk, soups, orange juice  Iced Popsicles                                    All solid food Carbonated beverages, regular and diet                                    Cranberry, grape and apple juices Sports drinks like Gatorade Lightly seasoned clear broth or consume(fat free) Sugar, honey syrup  Sample Menu Breakfast                                Lunch                                     Supper Cranberry juice  Beef broth                            Chicken broth Jell-O                                     Grape juice                            Apple juice Coffee or tea                        Jell-O                                      Popsicle                                                Coffee or tea                        Coffee or tea  _____________________________________________________________________  . BRUSH YOUR TEETH MORNING OF SURGERY AND RINSE YOUR MOUTH OUT, NO CHEWING GUM CANDY OR MINTS.    Take these medicines the morning of surgery with A SIP OF WATER: NONE                               You may not have any metal on your body including hair pins and              piercings     Do not wear jewelry, make-up, lotions, powders or perfumes, deodorant             Do not wear nail polish.  Do not shave  48 hours prior to surgery.                Do not bring valuables to the hospital. [place].  Contacts, dentures or bridgework may not be worn into surgery.        _____________________________________________________________________             [facility] - Preparing for Surgery Before surgery, you can play an important role.  Because skin is not sterile, your skin needs to be as free of germs as possible.  You can reduce the number of germs on your skin by washing with CHG (chlorahexidine gluconate) soap before surgery.  CHG is an antiseptic cleaner which kills germs and bonds with the skin to continue killing germs even after washing. Please DO NOT use if you have an allergy to CHG or antibacterial soaps.  If your skin becomes reddened/irritated stop using the CHG and inform your nurse when you arrive at Short Stay. Do not shave (including legs and underarms) for at least 48 hours prior to the first CHG shower.  You may shave your face/neck. Please follow these instructions carefully:  1.  Shower with CHG Soap the night before surgery and the  morning of  Surgery.  2.  If you choose to wash your hair, wash your hair first as usual with your  normal   shampoo.  3.  After you shampoo, rinse your hair and body thoroughly to remove the  shampoo.                           4.  Use CHG as you would any other liquid soap.  You can apply chg directly  to the skin and wash                       Gently with a scrungie or clean washcloth.  5.  Apply the CHG Soap to your body ONLY FROM THE NECK DOWN.   Do not use on face/ open                           Wound or open sores. Avoid contact with eyes, ears mouth and genitals (private parts).                       Wash face,  Genitals (private parts) with your normal soap.             6.  Wash thoroughly, paying special attention to the area where your surgery  will be performed.  7.  Thoroughly rinse your body with warm water from the neck down.  8.  DO NOT shower/wash with your normal soap after using and rinsing off  the CHG Soap.                9.  Pat yourself dry with a clean towel.            10.  Wear clean pajamas.            11.  Place clean sheets on your bed the night of your first shower and do not  sleep with pets. Day of Surgery : Do not apply any lotions/deodorants the morning of surgery.  Please wear clean clothes to the hospital/surgery center.  FAILURE TO FOLLOW THESE INSTRUCTIONS MAY RESULT IN THE CANCELLATION OF YOUR SURGERY PATIENT SIGNATURE_________________________________  NURSE SIGNATURE__________________________________  ________________________________________________________________________   [name]  An incentive spirometer is a tool that can help keep your lungs clear and active. This tool measures how well you are filling your lungs with each breath. Taking long deep breaths may help reverse or decrease the chance of developing breathing (pulmonary) problems (especially infection) following:  A long period of time when you are unable to move or be active. BEFORE THE PROCEDURE   If the spirometer includes an indicator to show your best effort, your nurse or  respiratory therapist will set it to a desired goal.  If possible, sit up straight or lean slightly forward. Try not to slouch.  Hold the incentive spirometer in an upright position. INSTRUCTIONS FOR USE  1. Sit on the edge of your bed if possible, or sit up as far as you can in bed or on a chair. 2. Hold the incentive spirometer in an upright position. 3. Breathe out normally. 4. Place the mouthpiece in your mouth and seal your lips tightly around it. 5. Breathe in slowly and as deeply as possible, raising the piston or the ball toward the top of the column. 6. Hold your breath for 3-5 seconds or for as long as possible. Allow  the piston or ball to fall to the bottom of the column. 7. Remove the mouthpiece from your mouth and breathe out normally. 8. Rest for a few seconds and repeat Steps 1 through 7 at least 10 times every 1-2 hours when you are awake. Take your time and take a few normal breaths between deep breaths. 9. The spirometer may include an indicator to show your best effort. Use the indicator as a goal to work toward during each repetition. 10. After each set of 10 deep breaths, practice coughing to be sure your lungs are clear. If you have an incision (the cut made at the time of surgery), support your incision when coughing by placing a pillow or rolled up towels firmly against it. Once you are able to get out of bed, walk around indoors and cough well. You may stop using the incentive spirometer when instructed by your caregiver.  RISKS AND COMPLICATIONS  Take your time so you do not get dizzy or light-headed.  If you are in pain, you may need to take or ask for pain medication before doing incentive spirometry. It is harder to take a deep breath if you are having pain. AFTER USE  Rest and breathe slowly and easily.  It can be helpful to keep track of a log of your progress. Your caregiver can provide you with a simple table to help with this. If you are using the  spirometer at home, follow these instructions: [place] IF:   You are having difficultly using the spirometer.  You have trouble using the spirometer as often as instructed.  Your pain medication is not giving enough relief while using the spirometer.  You develop fever of 100.5 F (38.1 C) or higher. SEEK IMMEDIATE MEDICAL CARE IF:   You cough up bloody sputum that had not been present before.  You develop fever of 102 F (38.9 C) or greater.  You develop worsening pain at or near the incision site. MAKE SURE YOU:   Understand these instructions.  Will watch your condition.  Will get help right away if you are not doing well or get worse. Document Released: 02/28/2007 Document Revised: 01/10/2012 Document Reviewed: 05/01/2007 ExitCare Patient Information 2014 ExitCare, [place].   ________________________________________________________________________  WHAT IS A BLOOD TRANSFUSION? Blood Transfusion Information  A transfusion is the replacement of blood or some of its parts. Blood is made up of multiple cells which provide different functions.  Red blood cells carry oxygen and are used for blood loss replacement.  White blood cells fight against infection.  Platelets control bleeding.  Plasma helps clot blood.  Other blood products are available for specialized needs, such as hemophilia or other clotting disorders. BEFORE THE TRANSFUSION  Who gives blood for transfusions?   Healthy volunteers who are fully evaluated to make sure their blood is safe. This is blood bank blood. Transfusion therapy is the safest it has ever been in the practice of medicine. Before blood is taken from a donor, a complete history is taken to make sure that person has no history of diseases nor engages in risky social behavior (examples are intravenous drug use or sexual activity with multiple partners). The donor's travel history is screened to minimize risk of transmitting  infections, such as malaria. The donated blood is tested for signs of infectious diseases, such as HIV and hepatitis. The blood is then tested to be sure it is compatible with you in order to minimize the chance of a transfusion reaction.  If you or a relative donates blood, this is often done in anticipation of surgery and is not appropriate for emergency situations. It takes many days to process the donated blood. RISKS AND COMPLICATIONS Although transfusion therapy is very safe and saves many lives, the main dangers of transfusion include:   Getting an infectious disease.  Developing a transfusion reaction. This is an allergic reaction to something in the blood you were given. Every precaution is taken to prevent this. The decision to have a blood transfusion has been considered carefully by your caregiver before blood is given. Blood is not given unless the benefits outweigh the risks. AFTER THE TRANSFUSION  Right after receiving a blood transfusion, you will usually feel much better and more energetic. This is especially true if your red blood cells have gotten low (anemic). The transfusion raises the level of the red blood cells which carry oxygen, and this usually causes an energy increase.  The nurse administering the transfusion will monitor you carefully for complications. HOME CARE INSTRUCTIONS  No special instructions are needed after a transfusion. You may find your energy is better. Speak with your caregiver about any limitations on activity for underlying diseases you may have. SEEK MEDICAL CARE IF:   Your condition is not improving after your transfusion.  You develop redness or irritation at the intravenous (IV) site. SEEK IMMEDIATE MEDICAL CARE IF:  Any of the following symptoms occur over the next 12 hours:  Shaking chills.  You have a temperature by mouth above 102 F (38.9 C), not controlled by medicine.  Chest, back, or muscle pain.  People around you feel you are  not acting correctly or are confused.  Shortness of breath or difficulty breathing.  Dizziness and fainting.  You get a rash or develop hives.  You have a decrease in urine output.  Your urine turns a dark color or changes to pink, red, or brown. Any of the following symptoms occur over the next 10 days:  You have a temperature by mouth above 102 F (38.9 C), not controlled by medicine.  Shortness of breath.  Weakness after normal activity.  The white part of the eye turns yellow (jaundice).  You have a decrease in the amount of urine or are urinating less often.  Your urine turns a dark color or changes to pink, red, or brown. Document Released: 10/15/2000 Document Revised: 01/10/2012 Document Reviewed: 06/03/2008 [facility] Patient Information 2014 [place], [place].  _______________________________________________________________________

## 2019-04-17 ENCOUNTER — Encounter (HOSPITAL_COMMUNITY)
Admission: RE | Admit: 2019-04-17 | Discharge: 2019-04-17 | Disposition: A | Discharge status: Home / Self Care | Payer: Medicare Other | Source: Ambulatory Visit | Attending: Surgery | Admitting: Surgery

## 2019-04-17 ENCOUNTER — Encounter (HOSPITAL_COMMUNITY): Payer: Self-pay

## 2019-04-17 ENCOUNTER — Encounter (INDEPENDENT_AMBULATORY_CARE_PROVIDER_SITE_OTHER): Payer: Self-pay

## 2019-04-17 ENCOUNTER — Other Ambulatory Visit: Payer: Self-pay

## 2019-04-17 ENCOUNTER — Other Ambulatory Visit (HOSPITAL_COMMUNITY)
Admission: RE | Admit: 2019-04-17 | Discharge: 2019-04-17 | Disposition: A | Discharge status: Home / Self Care | Payer: Medicare Other | Source: Ambulatory Visit | Attending: Surgery | Admitting: Surgery

## 2019-04-17 DIAGNOSIS — Z9221 Personal history of antineoplastic chemotherapy: Secondary | ICD-10-CM | POA: Diagnosis not present

## 2019-04-17 DIAGNOSIS — Z01812 Encounter for preprocedural laboratory examination: Secondary | ICD-10-CM | POA: Insufficient documentation

## 2019-04-17 DIAGNOSIS — Z9981 Dependence on supplemental oxygen: Secondary | ICD-10-CM | POA: Diagnosis not present

## 2019-04-17 DIAGNOSIS — Z85048 Personal history of other malignant neoplasm of rectum, rectosigmoid junction, and anus: Secondary | ICD-10-CM | POA: Diagnosis not present

## 2019-04-17 DIAGNOSIS — Z800 Family history of malignant neoplasm of digestive organs: Secondary | ICD-10-CM | POA: Diagnosis not present

## 2019-04-17 DIAGNOSIS — Z432 Encounter for attention to ileostomy: Secondary | ICD-10-CM | POA: Diagnosis not present

## 2019-04-17 DIAGNOSIS — Z808 Family history of malignant neoplasm of other organs or systems: Secondary | ICD-10-CM | POA: Diagnosis not present

## 2019-04-17 DIAGNOSIS — Z801 Family history of malignant neoplasm of trachea, bronchus and lung: Secondary | ICD-10-CM | POA: Diagnosis not present

## 2019-04-17 DIAGNOSIS — Z1159 Encounter for screening for other viral diseases: Secondary | ICD-10-CM | POA: Insufficient documentation

## 2019-04-17 DIAGNOSIS — K435 Parastomal hernia without obstruction or  gangrene: Secondary | ICD-10-CM | POA: Diagnosis not present

## 2019-04-17 DIAGNOSIS — Z923 Personal history of irradiation: Secondary | ICD-10-CM | POA: Diagnosis not present

## 2019-04-17 DIAGNOSIS — Z6821 Body mass index (BMI) 21.0-21.9, adult: Secondary | ICD-10-CM | POA: Diagnosis not present

## 2019-04-17 DIAGNOSIS — Z932 Ileostomy status: Secondary | ICD-10-CM | POA: Insufficient documentation

## 2019-04-17 DIAGNOSIS — Z886 Allergy status to analgesic agent status: Secondary | ICD-10-CM | POA: Diagnosis not present

## 2019-04-17 DIAGNOSIS — Z803 Family history of malignant neoplasm of breast: Secondary | ICD-10-CM | POA: Diagnosis not present

## 2019-04-17 LAB — CBC WITH DIFFERENTIAL/PLATELET
Abs Immature Granulocytes: 0.03 10*3/uL (ref 0.00–0.07)
Basophils Absolute: 0.1 10*3/uL (ref 0.0–0.1)
Basophils Relative: 1 %
Eosinophils Absolute: 0.2 10*3/uL (ref 0.0–0.5)
Eosinophils Relative: 1 %
HCT: 41.9 % (ref 36.0–46.0)
Hemoglobin: 13.4 g/dL (ref 12.0–15.0)
Immature Granulocytes: 0 %
Lymphocytes Relative: 9 %
Lymphs Abs: 1 10*3/uL (ref 0.7–4.0)
MCH: 31.3 pg (ref 26.0–34.0)
MCHC: 32 g/dL (ref 30.0–36.0)
MCV: 97.9 fL (ref 80.0–100.0)
Monocytes Absolute: 0.6 10*3/uL (ref 0.1–1.0)
Monocytes Relative: 6 %
Neutro Abs: 8.9 10*3/uL — ABNORMAL HIGH (ref 1.7–7.7)
Neutrophils Relative %: 83 %
Platelets: 382 10*3/uL (ref 150–400)
RBC: 4.28 MIL/uL (ref 3.87–5.11)
RDW: 13.8 % (ref 11.5–15.5)
WBC: 10.7 10*3/uL — ABNORMAL HIGH (ref 4.0–10.5)
nRBC: 0 % (ref 0.0–0.2)

## 2019-04-17 LAB — HEMOGLOBIN A1C
Hgb A1c MFr Bld: 5.6 % (ref 4.8–5.6)
Mean Plasma Glucose: 114.02 mg/dL

## 2019-04-17 LAB — COMPREHENSIVE METABOLIC PANEL
ALT: 17 U/L (ref 0–44)
AST: 20 U/L (ref 15–41)
Albumin: 4.3 g/dL (ref 3.5–5.0)
Alkaline Phosphatase: 93 U/L (ref 38–126)
Anion gap: 11 (ref 5–15)
BUN: 21 mg/dL (ref 8–23)
CO2: 22 mmol/L (ref 22–32)
Calcium: 9.6 mg/dL (ref 8.9–10.3)
Chloride: 102 mmol/L (ref 98–111)
Creatinine, Ser: 1.37 mg/dL — ABNORMAL HIGH (ref 0.44–1.00)
GFR calc Af Amer: 46 mL/min — ABNORMAL LOW (ref >60)
GFR calc non Af Amer: 40 mL/min — ABNORMAL LOW (ref >60)
Glucose, Bld: 120 mg/dL — ABNORMAL HIGH (ref 70–99)
Potassium: 4.5 mmol/L (ref 3.5–5.1)
Sodium: 135 mmol/L (ref 135–145)
Total Bilirubin: 0.4 mg/dL (ref 0.3–1.2)
Total Protein: 7.7 g/dL (ref 6.5–8.1)

## 2019-04-18 LAB — NOVEL CORONAVIRUS, NAA (HOSP ORDER, SEND-OUT TO REF LAB; TAT 18-24 HRS): SARS-CoV-2, NAA: NOT DETECTED

## 2019-04-19 MED ORDER — BUPIVACAINE LIPOSOME 1.3 % IJ SUSP
20.0000 mL | Freq: Once | INTRAMUSCULAR | Status: DC
  Filled 2019-04-19: qty 20

## 2019-04-20 ENCOUNTER — Inpatient Hospital Stay (HOSPITAL_COMMUNITY): Payer: Medicare Other | Admitting: Anesthesiology

## 2019-04-20 ENCOUNTER — Encounter (HOSPITAL_COMMUNITY)
Admission: RE | Disposition: A | Discharge status: Home / Self Care | Payer: Self-pay | Source: Home / Self Care | Attending: Surgery

## 2019-04-20 ENCOUNTER — Ambulatory Visit: Payer: Medicare Other | Admitting: Oncology

## 2019-04-20 ENCOUNTER — Inpatient Hospital Stay (HOSPITAL_COMMUNITY): Payer: Medicare Other | Admitting: Physician Assistant

## 2019-04-20 ENCOUNTER — Inpatient Hospital Stay (HOSPITAL_COMMUNITY)
Admission: RE | Admit: 2019-04-20 | Discharge: 2019-04-24 | DRG: 331 | Disposition: A | Discharge status: Home / Self Care | Payer: Medicare Other | Attending: Surgery | Admitting: Surgery

## 2019-04-20 ENCOUNTER — Encounter (HOSPITAL_COMMUNITY): Payer: Self-pay

## 2019-04-20 ENCOUNTER — Other Ambulatory Visit: Payer: Medicare Other

## 2019-04-20 ENCOUNTER — Other Ambulatory Visit: Payer: Self-pay

## 2019-04-20 DIAGNOSIS — K435 Parastomal hernia without obstruction or  gangrene: Secondary | ICD-10-CM | POA: Diagnosis not present

## 2019-04-20 DIAGNOSIS — Z432 Encounter for attention to ileostomy: Principal | ICD-10-CM

## 2019-04-20 DIAGNOSIS — L8990 Pressure ulcer of unspecified site, unspecified stage: Secondary | ICD-10-CM | POA: Insufficient documentation

## 2019-04-20 DIAGNOSIS — Z808 Family history of malignant neoplasm of other organs or systems: Secondary | ICD-10-CM | POA: Diagnosis not present

## 2019-04-20 DIAGNOSIS — Z801 Family history of malignant neoplasm of trachea, bronchus and lung: Secondary | ICD-10-CM | POA: Diagnosis not present

## 2019-04-20 DIAGNOSIS — Z803 Family history of malignant neoplasm of breast: Secondary | ICD-10-CM | POA: Diagnosis not present

## 2019-04-20 DIAGNOSIS — Z886 Allergy status to analgesic agent status: Secondary | ICD-10-CM | POA: Diagnosis not present

## 2019-04-20 DIAGNOSIS — F17210 Nicotine dependence, cigarettes, uncomplicated: Secondary | ICD-10-CM | POA: Diagnosis present

## 2019-04-20 DIAGNOSIS — Z923 Personal history of irradiation: Secondary | ICD-10-CM

## 2019-04-20 DIAGNOSIS — Z8049 Family history of malignant neoplasm of other genital organs: Secondary | ICD-10-CM

## 2019-04-20 DIAGNOSIS — Z9981 Dependence on supplemental oxygen: Secondary | ICD-10-CM

## 2019-04-20 DIAGNOSIS — N2889 Other specified disorders of kidney and ureter: Secondary | ICD-10-CM | POA: Diagnosis not present

## 2019-04-20 DIAGNOSIS — Z9221 Personal history of antineoplastic chemotherapy: Secondary | ICD-10-CM | POA: Diagnosis not present

## 2019-04-20 DIAGNOSIS — Z6821 Body mass index (BMI) 21.0-21.9, adult: Secondary | ICD-10-CM | POA: Diagnosis not present

## 2019-04-20 DIAGNOSIS — Z1159 Encounter for screening for other viral diseases: Secondary | ICD-10-CM

## 2019-04-20 DIAGNOSIS — Z8042 Family history of malignant neoplasm of prostate: Secondary | ICD-10-CM | POA: Diagnosis not present

## 2019-04-20 DIAGNOSIS — Z85048 Personal history of other malignant neoplasm of rectum, rectosigmoid junction, and anus: Secondary | ICD-10-CM | POA: Diagnosis not present

## 2019-04-20 DIAGNOSIS — Z8543 Personal history of malignant neoplasm of ovary: Secondary | ICD-10-CM

## 2019-04-20 DIAGNOSIS — Z8041 Family history of malignant neoplasm of ovary: Secondary | ICD-10-CM

## 2019-04-20 DIAGNOSIS — Z720 Tobacco use: Secondary | ICD-10-CM | POA: Diagnosis present

## 2019-04-20 DIAGNOSIS — Z932 Ileostomy status: Secondary | ICD-10-CM | POA: Diagnosis not present

## 2019-04-20 DIAGNOSIS — Z800 Family history of malignant neoplasm of digestive organs: Secondary | ICD-10-CM | POA: Diagnosis not present

## 2019-04-20 DIAGNOSIS — R159 Full incontinence of feces: Secondary | ICD-10-CM

## 2019-04-20 DIAGNOSIS — C20 Malignant neoplasm of rectum: Secondary | ICD-10-CM | POA: Diagnosis present

## 2019-04-20 DIAGNOSIS — Z9889 Other specified postprocedural states: Secondary | ICD-10-CM | POA: Diagnosis present

## 2019-04-20 HISTORY — DX: Benign neoplasm of transverse colon: D12.3

## 2019-04-20 HISTORY — PX: ILEO LOOP COLOSTOMY CLOSURE: SHX5257

## 2019-04-20 HISTORY — DX: Benign neoplasm of descending colon: D12.4

## 2019-04-20 LAB — TYPE AND SCREEN
ABO/RH(D): A POS
Antibody Screen: NEGATIVE

## 2019-04-20 SURGERY — Surgical Case
Anesthesia: *Unknown

## 2019-04-20 SURGERY — CLOSURE, ILEOSTOMY, LAPAROSCOPIC, WITH LAPAROTOMY IF INDICATED
Anesthesia: General | Site: Abdomen

## 2019-04-20 MED ORDER — ALUM & MAG HYDROXIDE-SIMETH 200-200-20 MG/5ML PO SUSP: 30.0000 mL | Freq: Four times a day (QID) | ORAL | Status: DC | PRN

## 2019-04-20 MED ORDER — LIDOCAINE HCL 2 % IJ SOLN
INTRAMUSCULAR | Status: AC
  Filled 2019-04-20: qty 20

## 2019-04-20 MED ORDER — SODIUM CHLORIDE 0.9 % IV SOLN
2.0000 g | INTRAVENOUS | Status: AC
  Administered 2019-04-20: 2 g via INTRAVENOUS
  Filled 2019-04-20: qty 2

## 2019-04-20 MED ORDER — ROCURONIUM BROMIDE 10 MG/ML (PF) SYRINGE
PREFILLED_SYRINGE | INTRAVENOUS | Status: AC
  Filled 2019-04-20: qty 10

## 2019-04-20 MED ORDER — EPHEDRINE 5 MG/ML INJ
INTRAVENOUS | Status: AC
  Filled 2019-04-20: qty 10

## 2019-04-20 MED ORDER — LIDOCAINE 2% (20 MG/ML) 5 ML SYRINGE
INTRAMUSCULAR | Status: AC
  Filled 2019-04-20: qty 5

## 2019-04-20 MED ORDER — 0.9 % SODIUM CHLORIDE (POUR BTL) OPTIME
TOPICAL | Status: DC | PRN
  Administered 2019-04-20: 2000 mL

## 2019-04-20 MED ORDER — TRAMADOL HCL 50 MG PO TABS
100.0000 mg | ORAL_TABLET | Freq: Two times a day (BID) | ORAL | Status: DC | PRN
  Administered 2019-04-20 – 2019-04-21 (×2): 100 mg via ORAL
  Filled 2019-04-20 (×2): qty 2

## 2019-04-20 MED ORDER — PHENYLEPHRINE 40 MCG/ML (10ML) SYRINGE FOR IV PUSH (FOR BLOOD PRESSURE SUPPORT)
PREFILLED_SYRINGE | INTRAVENOUS | Status: AC
  Filled 2019-04-20: qty 10

## 2019-04-20 MED ORDER — HYDRALAZINE HCL 20 MG/ML IJ SOLN: 10.0000 mg | INTRAMUSCULAR | Status: DC | PRN

## 2019-04-20 MED ORDER — PROPOFOL 10 MG/ML IV BOLUS
INTRAVENOUS | Status: DC | PRN
  Administered 2019-04-20: 50 mg via INTRAVENOUS

## 2019-04-20 MED ORDER — KETAMINE HCL 10 MG/ML IJ SOLN
INTRAMUSCULAR | Status: AC
  Filled 2019-04-20: qty 1

## 2019-04-20 MED ORDER — MIDAZOLAM HCL 5 MG/5ML IJ SOLN
INTRAMUSCULAR | Status: DC | PRN
  Administered 2019-04-20: 2 mg via INTRAVENOUS

## 2019-04-20 MED ORDER — DEXAMETHASONE SODIUM PHOSPHATE 10 MG/ML IJ SOLN
INTRAMUSCULAR | Status: AC
  Filled 2019-04-20: qty 1

## 2019-04-20 MED ORDER — CHLORHEXIDINE GLUCONATE CLOTH 2 % EX PADS: 6.0000 | MEDICATED_PAD | Freq: Once | CUTANEOUS | Status: DC

## 2019-04-20 MED ORDER — MIDAZOLAM HCL 2 MG/2ML IJ SOLN
INTRAMUSCULAR | Status: AC
  Filled 2019-04-20: qty 2

## 2019-04-20 MED ORDER — ACETAMINOPHEN 325 MG PO TABS
650.0000 mg | ORAL_TABLET | Freq: Four times a day (QID) | ORAL | Status: DC
  Administered 2019-04-20 – 2019-04-21 (×4): 650 mg via ORAL
  Filled 2019-04-20 (×4): qty 2

## 2019-04-20 MED ORDER — PHENYLEPHRINE HCL (PRESSORS) 10 MG/ML IV SOLN
INTRAVENOUS | Status: AC
  Filled 2019-04-20: qty 1

## 2019-04-20 MED ORDER — SODIUM CHLORIDE 0.9 % IV SOLN
INTRAVENOUS | Status: DC | PRN
  Administered 2019-04-20: 07:00:00 10 ug/min via INTRAVENOUS

## 2019-04-20 MED ORDER — SUGAMMADEX SODIUM 200 MG/2ML IV SOLN
INTRAVENOUS | Status: AC
  Filled 2019-04-20: qty 2

## 2019-04-20 MED ORDER — FENTANYL CITRATE (PF) 250 MCG/5ML IJ SOLN
INTRAMUSCULAR | Status: AC
  Filled 2019-04-20: qty 5

## 2019-04-20 MED ORDER — HEPARIN SODIUM (PORCINE) 5000 UNIT/ML IJ SOLN
5000.0000 [IU] | Freq: Once | INTRAMUSCULAR | Status: AC
  Administered 2019-04-20: 5000 [IU] via SUBCUTANEOUS
  Filled 2019-04-20: qty 1

## 2019-04-20 MED ORDER — BUPIVACAINE-EPINEPHRINE (PF) 0.25% -1:200000 IJ SOLN
INTRAMUSCULAR | Status: DC | PRN
  Administered 2019-04-20: 30 mL

## 2019-04-20 MED ORDER — FENTANYL CITRATE (PF) 100 MCG/2ML IJ SOLN: 25.0000 ug | INTRAMUSCULAR | Status: DC | PRN

## 2019-04-20 MED ORDER — LIDOCAINE 20MG/ML (2%) 15 ML SYRINGE OPTIME
INTRAMUSCULAR | Status: DC | PRN
  Administered 2019-04-20: 1.5 mg/kg/h via INTRAVENOUS

## 2019-04-20 MED ORDER — ESMOLOL HCL 100 MG/10ML IV SOLN
INTRAVENOUS | Status: AC
  Filled 2019-04-20: qty 10

## 2019-04-20 MED ORDER — BUPIVACAINE LIPOSOME 1.3 % IJ SUSP
INTRAMUSCULAR | Status: DC | PRN
  Administered 2019-04-20: 20 mL

## 2019-04-20 MED ORDER — ROCURONIUM BROMIDE 10 MG/ML (PF) SYRINGE
PREFILLED_SYRINGE | INTRAVENOUS | Status: DC | PRN
  Administered 2019-04-20: 30 mg via INTRAVENOUS
  Administered 2019-04-20: 20 mg via INTRAVENOUS

## 2019-04-20 MED ORDER — GLYCOPYRROLATE PF 0.2 MG/ML IJ SOSY
PREFILLED_SYRINGE | INTRAMUSCULAR | Status: AC
  Filled 2019-04-20: qty 1

## 2019-04-20 MED ORDER — GABAPENTIN 300 MG PO CAPS
300.0000 mg | ORAL_CAPSULE | ORAL | Status: AC
  Administered 2019-04-20: 300 mg via ORAL
  Filled 2019-04-20: qty 1

## 2019-04-20 MED ORDER — DEXAMETHASONE SODIUM PHOSPHATE 10 MG/ML IJ SOLN
INTRAMUSCULAR | Status: DC | PRN
  Administered 2019-04-20: 8 mg via INTRAVENOUS

## 2019-04-20 MED ORDER — SUCCINYLCHOLINE CHLORIDE 200 MG/10ML IV SOSY
PREFILLED_SYRINGE | INTRAVENOUS | Status: AC
  Filled 2019-04-20: qty 10

## 2019-04-20 MED ORDER — LACTATED RINGERS IV SOLN
INTRAVENOUS | Status: DC
  Administered 2019-04-20: 21:00:00 via INTRAVENOUS

## 2019-04-20 MED ORDER — BUPIVACAINE-EPINEPHRINE (PF) 0.25% -1:200000 IJ SOLN
INTRAMUSCULAR | Status: AC
  Filled 2019-04-20: qty 30

## 2019-04-20 MED ORDER — SUGAMMADEX SODIUM 200 MG/2ML IV SOLN
INTRAVENOUS | Status: DC | PRN
  Administered 2019-04-20: 150 mg via INTRAVENOUS

## 2019-04-20 MED ORDER — HYDROMORPHONE HCL 1 MG/ML IJ SOLN: 0.5000 mg | INTRAMUSCULAR | Status: DC | PRN

## 2019-04-20 MED ORDER — LIDOCAINE 2% (20 MG/ML) 5 ML SYRINGE
INTRAMUSCULAR | Status: DC | PRN
  Administered 2019-04-20: 60 mg via INTRAVENOUS

## 2019-04-20 MED ORDER — PHENYLEPHRINE 40 MCG/ML (10ML) SYRINGE FOR IV PUSH (FOR BLOOD PRESSURE SUPPORT)
PREFILLED_SYRINGE | INTRAVENOUS | Status: DC | PRN
  Administered 2019-04-20 (×2): 120 ug via INTRAVENOUS
  Administered 2019-04-20 (×2): 80 ug via INTRAVENOUS

## 2019-04-20 MED ORDER — EPHEDRINE SULFATE-NACL 50-0.9 MG/10ML-% IV SOSY
PREFILLED_SYRINGE | INTRAVENOUS | Status: DC | PRN
  Administered 2019-04-20: 5 mg via INTRAVENOUS

## 2019-04-20 MED ORDER — ALVIMOPAN 12 MG PO CAPS
12.0000 mg | ORAL_CAPSULE | ORAL | Status: AC
  Administered 2019-04-20: 12 mg via ORAL
  Filled 2019-04-20: qty 1

## 2019-04-20 MED ORDER — PROMETHAZINE HCL 25 MG/ML IJ SOLN: 6.2500 mg | INTRAMUSCULAR | Status: DC | PRN

## 2019-04-20 MED ORDER — PROPOFOL 10 MG/ML IV BOLUS
INTRAVENOUS | Status: AC
  Filled 2019-04-20: qty 40

## 2019-04-20 MED ORDER — ACETAMINOPHEN 500 MG PO TABS
1000.0000 mg | ORAL_TABLET | ORAL | Status: AC
  Administered 2019-04-20: 1000 mg via ORAL
  Filled 2019-04-20: qty 2

## 2019-04-20 MED ORDER — ONDANSETRON HCL 4 MG/2ML IJ SOLN
INTRAMUSCULAR | Status: AC
  Filled 2019-04-20: qty 2

## 2019-04-20 MED ORDER — LACTATED RINGERS IV SOLN
INTRAVENOUS | Status: DC
  Administered 2019-04-20 (×2): via INTRAVENOUS

## 2019-04-20 MED ORDER — ONDANSETRON HCL 4 MG PO TABS: 4.0000 mg | ORAL_TABLET | Freq: Four times a day (QID) | ORAL | Status: DC | PRN

## 2019-04-20 MED ORDER — ENSURE SURGERY PO LIQD
237.0000 mL | Freq: Two times a day (BID) | ORAL | Status: DC
  Administered 2019-04-20 – 2019-04-24 (×8): 237 mL via ORAL
  Filled 2019-04-20 (×9): qty 237

## 2019-04-20 MED ORDER — KETAMINE HCL 10 MG/ML IJ SOLN
INTRAMUSCULAR | Status: DC | PRN
  Administered 2019-04-20: 25 mg via INTRAVENOUS

## 2019-04-20 MED ORDER — FENTANYL CITRATE (PF) 100 MCG/2ML IJ SOLN
INTRAMUSCULAR | Status: DC | PRN
  Administered 2019-04-20 (×6): 25 ug via INTRAVENOUS

## 2019-04-20 MED ORDER — ALVIMOPAN 12 MG PO CAPS
12.0000 mg | ORAL_CAPSULE | Freq: Two times a day (BID) | ORAL | Status: DC
  Filled 2019-04-20 (×2): qty 1

## 2019-04-20 MED ORDER — ALBUTEROL SULFATE HFA 108 (90 BASE) MCG/ACT IN AERS
INHALATION_SPRAY | RESPIRATORY_TRACT | Status: DC | PRN
  Administered 2019-04-20: 2 via RESPIRATORY_TRACT

## 2019-04-20 MED ORDER — ONDANSETRON HCL 4 MG/2ML IJ SOLN: 4.0000 mg | Freq: Four times a day (QID) | INTRAMUSCULAR | Status: DC | PRN

## 2019-04-20 MED ORDER — SUCCINYLCHOLINE CHLORIDE 200 MG/10ML IV SOSY
PREFILLED_SYRINGE | INTRAVENOUS | Status: DC | PRN
  Administered 2019-04-20: 60 mg via INTRAVENOUS

## 2019-04-20 MED ORDER — HEPARIN SODIUM (PORCINE) 5000 UNIT/ML IJ SOLN
5000.0000 [IU] | Freq: Three times a day (TID) | INTRAMUSCULAR | Status: DC
  Administered 2019-04-20 – 2019-04-24 (×12): 5000 [IU] via SUBCUTANEOUS
  Filled 2019-04-20 (×12): qty 1

## 2019-04-20 MED ORDER — ALBUMIN HUMAN 5 % IV SOLN
INTRAVENOUS | Status: DC | PRN
  Administered 2019-04-20: 08:00:00 via INTRAVENOUS

## 2019-04-20 SURGICAL SUPPLY — 87 items
ADH SKN CLS APL DERMABOND .7 (GAUZE/BANDAGES/DRESSINGS)
APPLIER CLIP 5 13 M/L LIGAMAX5 (MISCELLANEOUS)
APPLIER CLIP ROT 10 11.4 M/L (STAPLE)
APR CLP MED LRG 11.4X10 (STAPLE)
APR CLP MED LRG 5 ANG JAW (MISCELLANEOUS)
BLADE EXTENDED COATED 6.5IN (ELECTRODE) IMPLANT
BNDG GAUZE ELAST 4 BULKY (GAUZE/BANDAGES/DRESSINGS) ×2 IMPLANT
CABLE HIGH FREQUENCY MONO STRZ (ELECTRODE) ×1 IMPLANT
CATH MUSHROOM 28FR (CATHETERS) IMPLANT
CATH MUSHROOM 30FR (CATHETERS) IMPLANT
CELLS DAT CNTRL 66122 CELL SVR (MISCELLANEOUS) IMPLANT
CLIP APPLIE 5 13 M/L LIGAMAX5 (MISCELLANEOUS) IMPLANT
CLIP APPLIE ROT 10 11.4 M/L (STAPLE) IMPLANT
COVER WAND RF STERILE (DRAPES) IMPLANT
DECANTER SPIKE VIAL GLASS SM (MISCELLANEOUS) ×3 IMPLANT
DERMABOND ADVANCED (GAUZE/BANDAGES/DRESSINGS)
DERMABOND ADVANCED .7 DNX12 (GAUZE/BANDAGES/DRESSINGS) IMPLANT
DISSECTOR BLUNT TIP ENDO 5MM (MISCELLANEOUS) IMPLANT
DRAIN CHANNEL 19F RND (DRAIN) IMPLANT
DRAPE SURG IRRIG POUCH 19X23 (DRAPES) ×1 IMPLANT
DRSG OPSITE POSTOP 4X10 (GAUZE/BANDAGES/DRESSINGS) IMPLANT
DRSG OPSITE POSTOP 4X6 (GAUZE/BANDAGES/DRESSINGS) IMPLANT
DRSG OPSITE POSTOP 4X8 (GAUZE/BANDAGES/DRESSINGS) IMPLANT
DRSG PAD ABDOMINAL 8X10 ST (GAUZE/BANDAGES/DRESSINGS) ×2 IMPLANT
ELECT REM PT RETURN 15FT ADLT (MISCELLANEOUS) ×3 IMPLANT
EVACUATOR SILICONE 100CC (DRAIN) IMPLANT
GAUZE SPONGE 4X4 12PLY STRL (GAUZE/BANDAGES/DRESSINGS) ×2 IMPLANT
GLOVE BIO SURGEON STRL SZ 6.5 (GLOVE) ×1 IMPLANT
GLOVE BIO SURGEON STRL SZ7.5 (GLOVE) ×4 IMPLANT
GLOVE BIO SURGEONS STRL SZ 6.5 (GLOVE) ×1
GLOVE BIOGEL PI IND STRL 6.5 (GLOVE) IMPLANT
GLOVE BIOGEL PI INDICATOR 6.5 (GLOVE) ×4
GLOVE ECLIPSE 6.5 STRL STRAW (GLOVE) ×2 IMPLANT
GLOVE INDICATOR 8.0 STRL GRN (GLOVE) ×4 IMPLANT
GLOVE SURG SIGNA 7.5 PF LTX (GLOVE) ×2 IMPLANT
GLOVE SURG SS PI 7.0 STRL IVOR (GLOVE) ×4 IMPLANT
GOWN STRL REUS W/TWL XL LVL3 (GOWN DISPOSABLE) ×12 IMPLANT
HOLDER FOLEY CATH W/STRAP (MISCELLANEOUS) ×3 IMPLANT
KIT TURNOVER KIT A (KITS) ×2 IMPLANT
LIGASURE IMPACT 36 18CM CVD LR (INSTRUMENTS) ×2 IMPLANT
NDL INSUFFLATION 14GA 120MM (NEEDLE) IMPLANT
NEEDLE INSUFFLATION 14GA 120MM (NEEDLE) IMPLANT
PACK COLON (CUSTOM PROCEDURE TRAY) ×3 IMPLANT
PAD POSITIONING PINK XL (MISCELLANEOUS) ×1 IMPLANT
PORT LAP GEL ALEXIS MED 5-9CM (MISCELLANEOUS) IMPLANT
RELOAD PROXIMATE 75MM BLUE (ENDOMECHANICALS) ×6 IMPLANT
RELOAD STAPLE 75 3.8 BLU REG (ENDOMECHANICALS) IMPLANT
RETRACTOR WND ALEXIS 18 MED (MISCELLANEOUS) IMPLANT
RTRCTR WOUND ALEXIS 18CM MED (MISCELLANEOUS)
SCISSORS LAP 5X35 DISP (ENDOMECHANICALS) ×1 IMPLANT
SEALER TISSUE G2 STRG ARTC 35C (ENDOMECHANICALS) ×1 IMPLANT
SET IRRIG TUBING LAPAROSCOPIC (IRRIGATION / IRRIGATOR) ×1 IMPLANT
SET TUBE SMOKE EVAC HIGH FLOW (TUBING) ×1 IMPLANT
SLEEVE ADV FIXATION 5X100MM (TROCAR) ×2 IMPLANT
SPONGE DRAIN TRACH 4X4 STRL 2S (GAUZE/BANDAGES/DRESSINGS) IMPLANT
STAPLER GUN LINEAR PROX 60 (STAPLE) ×2 IMPLANT
STAPLER PROXIMATE 75MM BLUE (STAPLE) ×2 IMPLANT
STAPLER VISISTAT 35W (STAPLE) IMPLANT
SUT ETHILON 3 0 PS 1 (SUTURE) IMPLANT
SUT PDS AB 1 CT  36 (SUTURE) ×4
SUT PDS AB 1 CT 36 (SUTURE) IMPLANT
SUT PDS AB 1 CTX 36 (SUTURE) ×2 IMPLANT
SUT PDS AB 1 TP1 54 (SUTURE) IMPLANT
SUT PDS AB 1 TP1 96 (SUTURE) IMPLANT
SUT PROLENE 2 0 KS (SUTURE) ×1 IMPLANT
SUT PROLENE 2 0 SH DA (SUTURE) ×1 IMPLANT
SUT SILK 2 0 (SUTURE) ×3
SUT SILK 2 0 SH CR/8 (SUTURE) ×1 IMPLANT
SUT SILK 2-0 18XBRD TIE 12 (SUTURE) ×1 IMPLANT
SUT SILK 3 0 (SUTURE)
SUT SILK 3 0 SH CR/8 (SUTURE) ×5 IMPLANT
SUT SILK 3-0 18XBRD TIE 12 (SUTURE) ×1 IMPLANT
SUT VIC AB 2-0 SH 27 (SUTURE) ×3
SUT VIC AB 2-0 SH 27X BRD (SUTURE) IMPLANT
SUT VIC AB 3-0 SH 18 (SUTURE) IMPLANT
SUT VIC AB 3-0 SH 27 (SUTURE)
SUT VIC AB 3-0 SH 27X BRD (SUTURE) IMPLANT
SUT VICRYL 2 0 18  UND BR (SUTURE)
SUT VICRYL 2 0 18 UND BR (SUTURE) ×1 IMPLANT
SYS LAPSCP GELPORT 120MM (MISCELLANEOUS)
SYSTEM LAPSCP GELPORT 120MM (MISCELLANEOUS) IMPLANT
TOWEL OR 17X26 10 PK STRL BLUE (TOWEL DISPOSABLE) IMPLANT
TOWEL OR NON WOVEN STRL DISP B (DISPOSABLE) ×3 IMPLANT
TRAY FOLEY MTR SLVR 14FR STAT (SET/KITS/TRAYS/PACK) ×2 IMPLANT
TRAY FOLEY MTR SLVR 16FR STAT (SET/KITS/TRAYS/PACK) IMPLANT
TROCAR ADV FIXATION 5X100MM (TROCAR) ×1 IMPLANT
TROCAR XCEL BLUNT TIP 100MML (ENDOMECHANICALS) IMPLANT

## 2019-04-20 NOTE — Anesthesia Procedure Notes (Signed)
Procedure Name: Intubation Date/Time: 04/20/2019 7:40 AM Performed by: Silas Sacramento, CRNA Pre-anesthesia Checklist: Patient identified, Emergency Drugs available, Suction available and Patient being monitored Patient Re-evaluated:Patient Re-evaluated prior to induction Oxygen Delivery Method: Circle system utilized Preoxygenation: Pre-oxygenation with 100% oxygen Induction Type: IV induction Ventilation: Mask ventilation without difficulty Laryngoscope Size: Mac and 3 Grade View: Grade I Tube type: Oral Number of attempts: 1 Airway Equipment and Method: Stylet and Oral airway Placement Confirmation: ETT inserted through vocal cords under direct vision,  positive ETCO2 and breath sounds checked- equal and bilateral Secured at: 22 cm Tube secured with: Tape Dental Injury: Teeth and Oropharynx as per pre-operative assessment

## 2019-04-20 NOTE — Plan of Care (Signed)

## 2019-04-20 NOTE — Anesthesia Postprocedure Evaluation (Signed)
Anesthesia Post Note  Patient: Deborah Arroyo  Procedure(s) Performed: OPEN TAKEDOWN OF ILEOSTOMY, primary repair of parastomal hernia (N/A Abdomen)     Patient location during evaluation: PACU Anesthesia Type: General Level of consciousness: awake and alert Pain management: pain level controlled Vital Signs Assessment: post-procedure vital signs reviewed and stable Respiratory status: spontaneous breathing, nonlabored ventilation, respiratory function stable and patient connected to nasal cannula oxygen Cardiovascular status: blood pressure returned to baseline and stable Postop Assessment: no apparent nausea or vomiting Anesthetic complications: no    Last Vitals:  Vitals:   04/20/19 1145 04/20/19 1202  BP: (!) 88/56 (!) 86/53  Pulse: 85 92  Resp: 12 16  Temp: 36.9 C 36.8 C  SpO2: 99% 92%    Last Pain:  Vitals:   04/20/19 1202  TempSrc: Oral  PainSc:                  Tiajuana Amass

## 2019-04-20 NOTE — Progress Notes (Signed)
Talked with the sister on the phone to give her an update.  The patient's sister was given the room number and the telephone to the patient's room and the nurse's station.  She said she would call the patient in an hour.

## 2019-04-20 NOTE — Op Note (Signed)
04/20/2019  9:05 AM  PATIENT:  Deborah Arroyo  68 y.o. female  Patient Care Team: Delice Bison, DO as PCP - General (Internal Medicine) Ileana Roup, MD as Consulting Physician (Colon and Rectal Surgery)  PRE-OPERATIVE DIAGNOSIS:  1. Rectal cancer 2. Ileostomy status with parastomal hernia  POST-OPERATIVE DIAGNOSIS:  Same  PROCEDURE:   1. Takedown of diverting loop ileostomy 2. Primary closure of parastomal hernia  SURGEON:  Sharon Mt. Dema Severin, MD  ASSISTANT: Alphonsa Overall, MD  ANESTHESIA:   general  COUNTS:  Sponge, needle and instrument counts were reported correct x2 at the conclusion of the operation.  EBL: 10 mL  DRAINS: None  SPECIMEN: Ileostomy  COMPLICATIONS: None  FINDINGS: Diverting loop ileostomy in place - she had a fistula on the proximal limb at the level of the skin between the ileostomy and environment. This was all resected. She had a small bowel containing parastomal hernia that was essentially additional ileum proximal to the stoma within the subcutaneous space.  DISPOSITION: PACU in satisfactory condition  INDICATION: Ms. Pal is a very pleasant 43yoF with hx of cT3cN2M0 rectal cancer whom underwent neoadjuvant chemoradiation therapy (completed 05/11/2018) followed by laparoscopic LAR with diverting loop ileostomy; anastomosis 4.5 cm from anal verge (07/12/2018). She developed a small parastomal hernia following this procedure but recovered well. Pathology returned ypT3N0M0. She was then given adjuvant capecitabine after declining infusional agents and tolerated this therapy reasonably well.  She completed her adjuvant chemotherapy 11/12/2018.  She underwent evaluation for ileostomy closure.  Gastrografin enema 11/08/2018 demonstrated a patent colorectal anastomosis without evidence of contrast extravasation.  She underwent flexible sigmoidoscopy 12/04/2018 which demonstrated a patent healthy-appearing end-to-end colorectal anastomosis.  No  anastomotic defects were identified.  She was scheduled for ileostomy closure but her case was ultimately delayed due to COVID 19.   DESCRIPTION: The patient was identified in preop holding and taken to the OR where she was placed on the operating room table. SCDs were placed. General endotracheal anesthesia was induced without difficulty.  A digital rectal exam was performed and demonstrated a patent anastomosis without palpable mass. She was then prepped and draped in the usual sterile fashion. A surgical timeout was performed indicating the correct patient, procedure, positioning and need for preoperative antibiotics.   A peristomal skin incision was created circumferentially.  The ileostomy was retracted in the subcutaneous tissue carefully divided with electrocautery.  The parastomal hernia was identified and was essentially additional ileum leading into the stoma.  This was carefully dissected free using Metzenbaum scissors from the hernia sac.  Care was taken not to injure any bowel during this process.  The fascia of the anterior rectus sheath was circumferentially dissected and the ileostomy free of this.  The peritoneum had been entered during dissection of the hernia sac.  This was developed circumferentially.  At this point the entire ileostomy was completely mobilized and was free of any attachments inside the abdomen.  The underlying peritoneum was palpated and there were no adhesions noted.  The segment of herniated intestine was approximately 7 to 10 cm and it was just proximal to the ileostomy.  This was included in the specimen.  Windows were created in each respective mesentery and the intestine divided using GIA blue load firings.  The intervening mesentery was then divided using a LigaSure.  This was then passed off the specimen.  The mesentery and small bowel was inspected and noted to be hemostatic.  There was good blood flow to the respective  limbs.  The distal limb was at least 20 cm  proximal to the ileocecal valve.  Attention was then turned to creating the ileoileal anastomosis.  Orientation was then ensured and there is no twisting of either limb of small intestine. A stay suture was placed. The corners of each respective staple line on the antimesenteric side were cut sharply.  The enteroenterostomy was created using a single firing of a GIA blue load 75 mm stapler.  This anastomosis was created to the antimesenteric border of each respective limb.  The staple line was inspected and noted to be hemostatic and complete.  The common enterotomy was then closed using a single firing of the TX 60 blue load stapler.  This was done after distracting the respective staple lines.  The corners of each staple line were then "dunked."  A "crotch" suture of 3-0 silk was placed.  The mesenteric defect was then approximated using interrupted 3-0 silk suture.  The anastomosis was palpated and noted to be widely patent.  It was pink and well perfused.  Contents were milked back and forth and there is no evidence of any leakage. It was then returned to the peritoneal cavity where it laid nicely without any twisting.  Sponge, needle, and instrument counts were reported correct x2. The fascia was then closed with 2 running #1 PDS sutures longitudinally.  This effectively closed the defect in a tension-free manner and this was then palpated and noted to be completely closed.  Local anesthetic consisting of a dilute mixture of Exparel and 0.25% Marcaine was infiltrated into the rectus sheath and peristomal skin.  The wound was irrigated and noted to be hemostatic.  A pursestring suture of 2-0 Vicryl was then placed to cinch down the closure site.  A moist Kerlix was then loosely packed into the wound, covered with 4 x 4's, and then an ABD pad.  She was then awakened from general anesthesia, extubated, and transferred to a stretcher for transport to PACU in satisfactory condition.

## 2019-04-20 NOTE — Transfer of Care (Signed)
Immediate Anesthesia Transfer of Care Note  Patient: Deborah Arroyo  Procedure(s) Performed: OPEN TAKEDOWN OF ILEOSTOMY, primary repair of parastomal hernia (N/A Abdomen)  Patient Location: PACU  Anesthesia Type:General  Level of Consciousness: sedated, patient cooperative and responds to stimulation  Airway & Oxygen Therapy: Patient Spontanous Breathing and Patient connected to face mask oxygen  Post-op Assessment: Report given to RN and Post -op Vital signs reviewed and stable  Post vital signs: Reviewed and stable  Last Vitals:  Vitals Value Taken Time  BP 100/61 04/20/19 0920  Temp    Pulse 84 04/20/19 0923  Resp 21 04/20/19 0923  SpO2 100 % 04/20/19 0923  Vitals shown include unvalidated device data.  Last Pain:  Vitals:   04/20/19 0606  TempSrc: Oral  PainSc:          Complications: No apparent anesthesia complications

## 2019-04-20 NOTE — H&P (Signed)
CC: Here today for surgery  HPI: Ms. Glace is a pleasant 64yoF with no known past medical history. For at least the last year she reports having had issues with hematochezia with each bowel movement. She denies whatsoever any abdominal pain, cramping, bloating, nausea, vomiting. She reports having normal bowel movements throughout all this. She has been tolerating a diet well. She was seen by Dr. Hilarie Fredrickson for the hematochezia and taken for colonoscopy on 03/01/18 which was notable for a circumferential partially obstructing mass in the rectum approximately 5 cm from the anal verge that could not be traversed with a EGD scope. Biopsies returned adenocarcinoma. The area distal to this mass was tattooed. She reports issues with incontinence of her feces for at least the last 1-2 years and this has been getting worse over time. Her incontinence is primarily to liquid stool as well as gas. She wears a pad daily. She does not believe this significantly impacts her daily quality of life or changes her activities based on this, but experiences daily accidents. She reports her weight as being stable and has gained 3 lbs in the last month.  She was seen by one of my colleagues in medical oncology, Dr. Benay Spice earlier this week and was quite frustrated with her new diagnosis of cancer as well as the amount of information being delivered to her regarding all of this. She left his office frustrated and declined follow-up the following day with Dr. Lisbeth Renshaw radiation oncology.  She underwent MRI pelvis 03/13/18 which demonstrated a locally advanced rectal cancer- cT3cN2. CT C/A/P 03/03/18 - showed a large rectal mass approximately 6 cm in length 5 cm of anorectal junction with multiple surrounding lymph nodes. Mildly enlarged right hilar lymph node not favored to be metastatic based on the radiology interpretation and other subcentimeter nodules which warrant surveillance  She completed Xeloda + XRT 05/11/18.  She tolerated this reasonably well. Since completing this, she reports improvement in some of her symptoms. She still does have some incontinent episodes to primarily liquid stool and accidents occurring once a week. She does occasionally also have incontinence to gas. She denies incontinence to solid stool. She does not wear a pad or diaper.  She completed her colonoscopy with Dr. Hilarie Fredrickson preoperatively which demonstrated a nonobstructing mass in the proximal rectum that he measured 8 cm from the dentate. No bleeding was present. Distal to the mass tattoo was noted. There is additionally a 10 mm polyp in hepatic flexure that was removed piecemeal. Finally a polyp was found in descending colon and removed. Pathology revealed TAs  OR 07/12/18 - laparoscopic low anterior resection with coloproctostomy (anastomosis 4.5 cm from anal verge), diverting loop ileostomy, flexible sigmoidoscopy, cysto/stents. She recovered well postoperatively. She did initially have issues with high stoma output which kept her in the hospital a couple extra days. She had a small notable parastomal hernia at the time as well. She was discharged home. She returned to the emergency room 9/26 with a leaking stoma and one episode nausea/vomiting previously. She was seen in emergency room and noted to have a leukocytosis. She had no fevers. She had no abdominal pain. She was tolerating a diet without nausea or vomiting at that time. Her stoma was working. She underwent evaluation with a CT of the head as well as A/P which were largely unremarkable-she did have a small parastomal hernia but her ileostomy was productive and contrast was noted in the bag. Her chest x-ray demonstrated no evidence of pneumonia. Her urinalysis demonstrated  no UTI. She was subsequently discharged back home  Pathology returned invasive moderately differentiated colorectal adenocarcinoma 2.5 cm extending in the perirectal connective tissue,  margins negative, 0 of 11 lymph nodes, separate tubular adenoma 0.5 cm. LVI: No PNI: No  ypT3N0M0  Her case was presented at our multispecialty disparity report and the consensus was for her to proceed with adjuvant chemotherapy given her pretreatment and preoperative staging. She declined infusional agents and is therefore being given adjuvant capecitabine beginning 08/07/18  Gastrografin enema-11/08/2018-no leak at the colorectal anastomosis, patent. Flexible sigmoidoscopy 12/04/2018-demonstrated a patent end-to-end colorectal anastomosis with healthy-appearing mucosa.  She completed adjuvant chemotherapy 11/12/2018. She reported having tolerated this well. She was seen back in the office 12/2018 and noted to be doing well. She denied any complaints. She was tolerating a diet and had appropriate ostomy output.  No major appliance issues at this time. She is not currently taking antidiarrheals. Ready for surgery today. She denies any changes in her health, medication history or other history  PMH: Denies  PSH: Open appendectomy - RLQ Laparoscopic cholecystectomy BTL-low midline incision Hysterectomy-TVH-denies prior radiation but this was done for cervical cancer Shoulder surgery Eye surgery  OB/Gyn: G4P3, all vaginal deliveries  FHx: Brother-prostate ca Mom-uterine ca Sister-gastric and ovarian ca Brother-lung cancer Daughter-breast ca  Social: Denies use of tobacco-one to one and a half packs per day for the last 40-42 years; denies use of alcohol or drugs. She is currently retired-has held numerous prior jobs, the most recent being it [employer]  ROS: A comprehensive 10 system review of systems was completed with the patient and pertinent findings as noted above.   Past Medical History:  Diagnosis Date  . Cancer ([place]) 03/01/2018   rectal cancer=had chemo pills and radation   . Cataract   . Cervix cancer ([place]) 1979  . Depression   . Dyspnea   .  Hypoglycemia   . Hyponatremia    syncope with this and admitted twice due to this issue   . Osteopenia   . Rectal bleeding   . Smoker     Past Surgical History:  Procedure Laterality Date  . ABDOMINAL HYSTERECTOMY    . APPENDECTOMY    . BIOPSY  07/04/2018   Procedure: BIOPSY;  Surgeon: Jerene Bears, MD;  Location: [facility];  Service: Gastroenterology;;  . CHOLECYSTECTOMY    . COLONOSCOPY  03/01/2018   Dr.Pyrtle  . COLONOSCOPY WITH PROPOFOL N/A 07/04/2018   Procedure: COLONOSCOPY WITH PROPOFOL;  Surgeon: Jerene Bears, MD;  Location: [facility];  Service: Gastroenterology;  Laterality: N/A;  . CYSTOSCOPY WITH STENT PLACEMENT Bilateral 07/12/2018   Procedure: CYSTOSCOPY WITH STENT PLACEMENT;  Surgeon: Lucas Mallow, MD;  Location: [facility];  Service: Urology;  Laterality: Bilateral;  . DIVERTING ILEOSTOMY N/A 07/12/2018   Procedure: DIVERTING LOOP ILEOSTOMY;  Surgeon: Ileana Roup, MD;  Location: [facility];  Service: General;  Laterality: N/A;  . EYE SURGERY    . FLEXIBLE SIGMOIDOSCOPY N/A 07/12/2018   Procedure: FLEXIBLE SIGMOIDOSCOPY;  Surgeon: Ileana Roup, MD;  Location: [facility];  Service: General;  Laterality: N/A;  . FLEXIBLE SIGMOIDOSCOPY N/A 12/04/2018   Procedure: FLEXIBLE SIGMOIDOSCOPY;  Surgeon: Ileana Roup, MD;  Location: [facility];  Service: General;  Laterality: N/A;  . LAPAROSCOPIC LOW ANTERIOR RESECTION N/A 07/12/2018   Procedure: LAPAROSCOPIC LOW ANTERIOR RESECTION WITH COLOPROSTOSTOMY, ERAS PATHWAY;  Surgeon: Ileana Roup, MD;  Location: [facility];  Service: General;  Laterality: N/A;  . POLYPECTOMY  07/04/2018   Procedure: POLYPECTOMY;  Surgeon: Jerene Bears, MD;  Location: [facility];  Service: Gastroenterology;;  . SHOULDER SURGERY Left    Rotary Cuff Tear  . TUBAL LIGATION      Family History  Problem Relation Age of Onset  . Stomach cancer Sister   . Ovarian cancer Sister   . Cancer - Ovarian Sister   . Dementia Mother   .  Uterine cancer Mother   . Endometrial cancer Mother   . Prostate cancer Brother   . Cancer - Prostate Brother   . Cancer Other   . Cancer Brother        lung/brain  . Breast cancer Daughter   . Alzheimer's disease Father   . Colon cancer Neg Hx   . Esophageal cancer Neg Hx   . Pancreatic cancer Neg Hx   . Rectal cancer Neg Hx   . Colon polyps Neg Hx     Social:  reports that she has been smoking cigarettes. She has been smoking about 1.00 pack per day. She has never used smokeless tobacco. She reports that she does not drink alcohol or use drugs.  Allergies:  Allergies  Allergen Reactions  . Aleve [Naproxen Sodium] Swelling and Other (See Comments)    Eyes and throat swell up    Medications: I have reviewed the patient's current medications.  No results found for this or any previous visit (from the past 48 hour(s)).  No results found.  ROS - all of the below systems have been reviewed with the patient and positives are indicated with bold text General: chills, fever or night sweats Eyes: blurry vision or double vision ENT: epistaxis or sore throat Allergy/Immunology: itchy/watery eyes or nasal congestion Hematologic/Lymphatic: bleeding problems, blood clots or swollen lymph nodes Endocrine: temperature intolerance or unexpected weight changes Breast: new or changing breast lumps or nipple discharge Resp: cough, shortness of breath, or wheezing CV: chest pain or dyspnea on exertion GI: as per HPI GU: dysuria, trouble voiding, or hematuria MSK: joint pain or joint stiffness Neuro: TIA or stroke symptoms Derm: pruritus and skin lesion changes Psych: anxiety and depression  PE Blood pressure 102/68, pulse 91, temperature 98.7 F (37.1 C), temperature source Oral, resp. rate 16, height 4\' 11"  (1.499 m), weight 44.6 kg, SpO2 97 %. Constitutional: NAD; conversant; no deformities Eyes: Moist conjunctiva; no lid lag; anicteric; PERRL Neck: Trachea midline; no thyromegaly  Lungs: Normal respiratory effort; no tactile fremitus CV: RRR; no palpable thrills; no pitting edema GI: Abd soft, NT/ND; ileostomy in place with appliance covering; no palpable hepatosplenomegaly MSK: Normal gait; no clubbing/cyanosis Psychiatric: Appropriate affect; alert and oriented x3 Lymphatic: No palpable cervical or axillary lymphadenopathy  A/P: Ms. Silman is a pleasant 49yoF with 56yr hx of mid rectal cancer - 5cm from anal verge; CEA 1.9; no evidence of metastatic disease on CT C/A/P; MRI-P shows this to be cT3cN2 - s/p Xeloda+XRT - completed 05/11/18  S/P Laparoscopic LAR with coloproctostomy and DLI 07/12/18 Pathology: ypT3N0M0 Completed adjuvant capecitabine - 08/07/18 - 11/12/2018 G[facility]- normal appearing anastomosis Flex sig- normal appearing anastomosis -Surveillance CT scans 01/04/2019 - no evidence of local recurrence or metastatic disease -The anatomy and physiology of the GI tract was discussed at length with the patient again today. The pathophysiology and technical aspects of ileostomy reversal was discussed at length as well. -We discussed takedown of loop ileostomy. We discussed possible additional laparoscopic as well as open approaches to this procedure. We discussed primary repair of parastomal  hernia. -The planned procedure, material risks (including, but not limited to, pain, bleeding, infection, scarring, need for blood transfusion, damage to surrounding structures- blood vessels/nerves/viscus/organs, leak from anastomosis, need for additional procedures/surgeries, need for repeat stoma which may be permanent, recurrent hernia, recurrence, pneumonia, heart attack, stroke, death) benefits and alternatives to surgery were discussed at length. The patient's questions were answered to her satisfaction, she voiced understanding and elected to proceed with surgery. Additionally, we discussed typical postoperative expectations and the recovery process. We discussed potential for  initial poor function, incontinent episodes, frequency, urgency and clustering of stools which may take 6-12 mo to improve.  Sharon Mt. Dema Severin, M.D. General and Colorectal Surgery [facility]

## 2019-04-21 ENCOUNTER — Encounter (HOSPITAL_COMMUNITY): Payer: Self-pay | Admitting: Surgery

## 2019-04-21 LAB — CBC
HCT: 31.5 % — ABNORMAL LOW (ref 36.0–46.0)
Hemoglobin: 10.1 g/dL — ABNORMAL LOW (ref 12.0–15.0)
MCH: 31.8 pg (ref 26.0–34.0)
MCHC: 32.1 g/dL (ref 30.0–36.0)
MCV: 99.1 fL (ref 80.0–100.0)
Platelets: 279 10*3/uL (ref 150–400)
RBC: 3.18 MIL/uL — ABNORMAL LOW (ref 3.87–5.11)
RDW: 13.8 % (ref 11.5–15.5)
WBC: 15.6 10*3/uL — ABNORMAL HIGH (ref 4.0–10.5)
nRBC: 0 % (ref 0.0–0.2)

## 2019-04-21 LAB — BASIC METABOLIC PANEL
Anion gap: 7 (ref 5–15)
BUN: 17 mg/dL (ref 8–23)
CO2: 24 mmol/L (ref 22–32)
Calcium: 8.8 mg/dL — ABNORMAL LOW (ref 8.9–10.3)
Chloride: 101 mmol/L (ref 98–111)
Creatinine, Ser: 1.05 mg/dL — ABNORMAL HIGH (ref 0.44–1.00)
GFR calc Af Amer: >60 mL/min (ref >60)
GFR calc non Af Amer: 55 mL/min — ABNORMAL LOW (ref >60)
Glucose, Bld: 130 mg/dL — ABNORMAL HIGH (ref 70–99)
Potassium: 5.2 mmol/L — ABNORMAL HIGH (ref 3.5–5.1)
Sodium: 132 mmol/L — ABNORMAL LOW (ref 135–145)

## 2019-04-21 LAB — PHOSPHORUS: Phosphorus: 2.6 mg/dL (ref 2.5–4.6)

## 2019-04-21 LAB — MAGNESIUM: Magnesium: 1.8 mg/dL (ref 1.7–2.4)

## 2019-04-21 MED ORDER — PSYLLIUM 95 % PO PACK
1.0000 | PACK | Freq: Every day | ORAL | Status: DC
  Administered 2019-04-21 – 2019-04-24 (×3): 1 via ORAL
  Filled 2019-04-21 (×3): qty 1

## 2019-04-21 MED ORDER — TRAMADOL HCL 50 MG PO TABS
100.0000 mg | ORAL_TABLET | Freq: Four times a day (QID) | ORAL | Status: DC | PRN
  Administered 2019-04-21 – 2019-04-24 (×5): 100 mg via ORAL
  Filled 2019-04-21 (×5): qty 2

## 2019-04-21 MED ORDER — BISMUTH SUBSALICYLATE 262 MG/15ML PO SUSP: 30.0000 mL | Freq: Three times a day (TID) | ORAL | Status: DC | PRN

## 2019-04-21 MED ORDER — GABAPENTIN 300 MG PO CAPS
300.0000 mg | ORAL_CAPSULE | Freq: Three times a day (TID) | ORAL | Status: DC
  Administered 2019-04-21 – 2019-04-24 (×10): 300 mg via ORAL
  Filled 2019-04-21 (×10): qty 1

## 2019-04-21 MED ORDER — HYDROMORPHONE HCL 1 MG/ML IJ SOLN
0.5000 mg | INTRAMUSCULAR | Status: DC | PRN
  Administered 2019-04-21 – 2019-04-23 (×2): 1 mg via INTRAVENOUS
  Filled 2019-04-21 (×2): qty 1

## 2019-04-21 MED ORDER — CYCLOBENZAPRINE HCL 5 MG PO TABS: 5.0000 mg | ORAL_TABLET | Freq: Three times a day (TID) | ORAL | Status: DC | PRN

## 2019-04-21 MED ORDER — ACETAMINOPHEN 500 MG PO TABS
1000.0000 mg | ORAL_TABLET | Freq: Four times a day (QID) | ORAL | Status: DC
  Administered 2019-04-21 – 2019-04-24 (×11): 1000 mg via ORAL
  Filled 2019-04-21 (×12): qty 2

## 2019-04-21 NOTE — Plan of Care (Signed)
Patient sitting up in bed this morning; complaining of mild pain in abdomen and neck. No additional concerns/needs currently. Will continue to monitor.

## 2019-04-21 NOTE — Progress Notes (Signed)
Deborah Arroyo [id number] [birth year]  CARE TEAM:  PCP: Delice Bison, DO  Outpatient Care Team: Patient Care Team: Delice Bison, DO as PCP - General (Internal Medicine) Ileana Roup, MD as Consulting Physician (Colon and Rectal Surgery)  Inpatient Treatment Team: Treatment Team: Attending Provider: Ileana Roup, MD; Technician: Sharren Bridge, NT; Registered Nurse: Petra Kuba, RN   Problem List:   Active Problems:   S/P closure of ileostomy   1 Day Post-Op  04/20/2019  PRE-OPERATIVE DIAGNOSIS:  1. Rectal cancer 2. Ileostomy status with parastomal hernia  POST-OPERATIVE DIAGNOSIS:  Same  PROCEDURE:   1. Takedown of diverting loop ileostomy 2. Primary closure of parastomal hernia  SURGEON:  Sharon Mt. White, MD    Assessment  Recovering  [facility] Stay = 1 days)  Plan:  -Improved pain control.  Increase Tylenol.  Add gabapentin.  Add back Flexeril and as needed.  Narcotics PRN. -Advance diet gradually. -Psyllium fiber bowel regimen. -VTE prophylaxis- SCDs, etc -mobilize as tolerated to help recovery  20 minutes spent in review, evaluation, examination, counseling, and coordination of care.  More than 50% of that time was spent in counseling.  04/21/2019    Subjective: (Chief complaint)  Sore at incision with some oozing last night .  Dressing reinforced. Tolerating liquids  Large loose bowel movement last night with some incontinence.  Another loose bowel movement this morning.  Not nauseated.   Objective:  Vital signs:  Vitals:   04/20/19 1801 04/20/19 2052 04/21/19 0044 04/21/19 0551  BP: (!) 97/59 (!) 90/56 92/62 93/65   Pulse: 90 75 81 81  Resp: 16 15 15 15   Temp: 97.7 F (36.5 C) (!) 97.5 F (36.4 C) 97.8 F (36.6 C) 98 F (36.7 C)  TempSrc: Oral Oral Oral Oral  SpO2: 100% 98% 96% 97%  Weight:    48.7 kg  Height:        Last BM Date: 04/20/19  Intake/Output   Yesterday:  06/19  0701 - 06/20 0700 In: 4825 [P.O.:880; I.V.:3327; IV Piggyback:350] Out: 1160 [Urine:1150; Blood:10] This shift:  No intake/output data recorded.  Bowel function:  Flatus: YES  BM:  YES  Drain: (No drain)   Physical Exam:  General: Pt awake/alert/oriented x4 in no acute distress Eyes: PERRL, normal EOM.  Sclera clear.  No icterus Neuro: CN II-XII intact w/o focal sensory/motor deficits. Lymph: No head/neck/groin lymphadenopathy Psych:  No delerium/psychosis/paranoia HENT: Normocephalic, Mucus membranes moist.  No thrush Neck: Supple, No tracheal deviation Chest: No chest wall pain w good excursion CV:  Pulses intact.  Regular rhythm MS: Normal AROM mjr joints.  No obvious deformity  Abdomen: Soft.  Moderately distended.  Mildly tender at incisions only.  No evidence of peritonitis.  No incarcerated hernias.  Ext:  No deformity.  No mjr edema.  No cyanosis Skin: No petechiae / purpura  Results:   Cultures: Recent Results (from the past 720 hour(s))  Novel Coronavirus, NAA (hospital order; send-out to ref lab)     Status: None   Collection Time: 04/17/19  2:03 PM   Specimen: Nasopharyngeal Swab; Respiratory  Result Value Ref Range Status   SARS-CoV-2, NAA NOT DETECTED NOT DETECTED Final    Comment: (NOTE) This test was developed and its performance characteristics determined by [employer]. This test has not been FDA cleared or approved. This test has been authorized by FDA under an Emergency Use Authorization (EUA). This test is only authorized for the duration of time the declaration that  circumstances exist justifying the authorization of the emergency use of in vitro diagnostic tests for detection of SARS-CoV-2 virus and/or diagnosis of COVID-19 infection under section 564(b)(1) of the Act, 21 U.S.C. 048GQB-1(Q)(9), unless the authorization is terminated or revoked sooner. When diagnostic testing is negative, the possibility of a false negative result  should be considered in the context of a patient's recent exposures and the presence of clinical signs and symptoms consistent with COVID-19. An individual without symptoms of COVID-19 and who is not shedding SARS-CoV-2 virus would expect to have a negative (not detected) result in this assay. Performed  At: [facility] [address] [place], [place] [id number] Rush Farmer MD MK:[id number]    [place]  Final    Comment: Performed at [facility], [place] [address] [place], [place] [address]    Labs: Results for orders placed or performed during the hospital encounter of 04/20/19 (from the past 48 hour(s))  Basic metabolic panel     Status: Abnormal   Collection Time: 04/21/19  4:32 AM  Result Value Ref Range   Sodium 132 (L) 135 - 145 mmol/L   Potassium 5.2 (H) 3.5 - 5.1 mmol/L   Chloride 101 98 - 111 mmol/L   CO2 24 22 - 32 mmol/L   Glucose, Bld 130 (H) 70 - 99 mg/dL   BUN 17 8 - 23 mg/dL   Creatinine, Ser 1.05 (H) 0.44 - 1.00 mg/dL   Calcium 8.8 (L) 8.9 - 10.3 mg/dL   GFR calc non Af Amer 55 (L) >60 mL/min   GFR calc Af Amer >60 >60 mL/min   Anion gap 7 5 - 15    Comment: Performed at [facility], [place] [address], [place], [place] [address]  CBC     Status: Abnormal   Collection Time: 04/21/19  4:32 AM  Result Value Ref Range   WBC 15.6 (H) 4.0 - 10.5 K/uL   RBC 3.18 (L) 3.87 - 5.11 MIL/uL   Hemoglobin 10.1 (L) 12.0 - 15.0 g/dL   HCT 31.5 (L) 36.0 - 46.0 %   MCV 99.1 80.0 - 100.0 fL   MCH 31.8 26.0 - 34.0 pg   MCHC 32.1 30.0 - 36.0 g/dL   RDW 13.8 11.5 - 15.5 %   Platelets 279 150 - 400 K/uL   nRBC 0.0 0.0 - 0.2 %    Comment: Performed at [facility], [place] [address], [place], [place] [address]  Magnesium     Status: None   Collection Time: 04/21/19  4:32 AM  Result Value Ref Range   Magnesium 1.8 1.7 - 2.4 mg/dL    Comment: Performed at [facility], [place] [address] [place], [place] [address]  Phosphorus     Status: None   Collection Time: 04/21/19  4:32 AM  Result Value Ref Range   Phosphorus 2.6 2.5 - 4.6 mg/dL    Comment: Performed at [facility], [place] [address], [place], [place] [address]    Imaging / Studies: No results found.  Medications / Allergies: per chart  Antibiotics: Anti-infectives (From admission, onward)   Start     Dose/Rate Route Frequency Ordered Stop   04/20/19 0600  cefoTEtan (CEFOTAN) 2 g in sodium chloride 0.9 % 100 mL IVPB     2 g 200 mL/hr over 30 Minutes Intravenous On call to O.R. 04/20/19 9201 04/20/19 0071        Note: Portions of this report may  have been transcribed using voice recognition software. Every effort was made to ensure accuracy; however, inadvertent computerized transcription errors may be present.   Any transcriptional errors that result from this process are unintentional.     Adin Hector, MD, FACS, MASCRS Gastrointestinal and Minimally Invasive Surgery    [address] [place],  [address] [phone] Main / Paging [phone] Fax

## 2019-04-22 ENCOUNTER — Encounter (HOSPITAL_COMMUNITY): Payer: Self-pay | Admitting: Surgery

## 2019-04-22 DIAGNOSIS — R159 Full incontinence of feces: Secondary | ICD-10-CM

## 2019-04-22 DIAGNOSIS — L8990 Pressure ulcer of unspecified site, unspecified stage: Secondary | ICD-10-CM | POA: Insufficient documentation

## 2019-04-22 LAB — GLUCOSE, CAPILLARY
Glucose-Capillary: 75 mg/dL (ref 70–99)
Glucose-Capillary: 107 mg/dL — ABNORMAL HIGH (ref 70–99)

## 2019-04-22 MED ORDER — LOPERAMIDE HCL 2 MG PO CAPS
2.0000 mg | ORAL_CAPSULE | Freq: Once | ORAL | Status: AC
  Administered 2019-04-22: 16:00:00 2 mg via ORAL
  Filled 2019-04-22: qty 1

## 2019-04-22 MED ORDER — FERROUS SULFATE 325 (65 FE) MG PO TABS
325.0000 mg | ORAL_TABLET | Freq: Two times a day (BID) | ORAL | Status: DC
  Administered 2019-04-22 – 2019-04-24 (×4): 325 mg via ORAL
  Filled 2019-04-22 (×4): qty 1

## 2019-04-22 MED ORDER — LOPERAMIDE HCL 2 MG PO CAPS
2.0000 mg | ORAL_CAPSULE | Freq: Every day | ORAL | Status: DC
  Administered 2019-04-22: 23:00:00 2 mg via ORAL
  Filled 2019-04-22: qty 1

## 2019-04-22 NOTE — Progress Notes (Signed)
Deborah Arroyo [id number] 07-15-1951  CARE TEAM:  PCP: Delice Bison, DO  Outpatient Care Team: Patient Care Team: Delice Bison, DO as PCP - General (Internal Medicine) Ileana Roup, MD as Consulting Physician (Colon and Rectal Surgery)  Inpatient Treatment Team: Treatment Team: Attending Provider: Ileana Roup, MD; Technician: Sharren Bridge, NT; Registered Nurse: Petra Kuba, RN; Registered Nurse: Johna Sheriff, RN   Problem List:   Active Problems:   S/P closure of ileostomy   Pressure injury of skin   2 Days Post-Op  04/20/2019  PRE-OPERATIVE DIAGNOSIS:  1. Rectal cancer 2. Ileostomy status with parastomal hernia  POST-OPERATIVE DIAGNOSIS:  Same  PROCEDURE:   1. Takedown of diverting loop ileostomy 2. Primary closure of parastomal hernia  SURGEON:  Sharon Mt. White, MD    Assessment  Recovering  Some diarrhea and overflow fecal incontinence  [facility] Stay = [facility] days)  Plan:  -Improved pain control.  Increase Tylenol.  Add gabapentin.  Add back Flexeril and as needed.  Narcotics PRN. -Advance diet gradually. -Psyllium fiber bowel regimen.  Add iron to help constipate.  Imodium at bedtime and 1 dose now.  Try to avoid too much less she get an ileus.  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 -VTE prophylaxis- SCDs, etc -mobilize as tolerated to help recovery  20 minutes spent in review, evaluation, examination, counseling, and coordination of care.  More than 50% of that time was spent in counseling.  04/22/2019    Subjective: (Chief complaint)   Pain better controlled.  Still with loose bowel movements.  Challenged to hold them in.  Occasional incontinence   Objective:  Vital signs:  Vitals:   04/21/19 1004 04/21/19 1407 04/21/19 2208 04/22/19 0519  BP: 91/63 100/73 95/63 92/62   Pulse: 81 86 88 89  Resp: 16 16  16 16   Temp: 98.8 F (37.1 C) 97.8 F (36.6 C) 98.8 F (37.1 C) 97.9 F (36.6 C)  TempSrc: Oral Oral Oral Oral  SpO2: 98% 93% 93% 92%  Weight:    47.5 kg  Height:        Last BM Date: 04/21/19  Intake/Output   Yesterday:  06/20 0701 - 06/21 0700 In: 780 [P.O.:780] Out: -  This shift:  No intake/output data recorded.  Bowel function:  Flatus: YES  BM:  YES  Drain: (No drain)   Physical Exam:  General: Pt awake/alert/oriented x4 in no acute distress Eyes: PERRL, normal EOM.  Sclera clear.  No icterus Neuro: CN II-XII intact w/o focal sensory/motor deficits. Lymph: No head/neck/groin lymphadenopathy Psych:  No delerium/psychosis/paranoia HENT: Normocephalic, Mucus membranes moist.  No thrush Neck: Supple, No tracheal deviation Chest: No chest wall pain w good excursion CV:  Pulses intact.  Regular rhythm MS: Normal AROM mjr joints.  No obvious deformity  Abdomen: Soft.  Mildy distended.  Mildly tender at incisions only.  All dressings with old blood removed.  Packing removed out of ileostomy wound.  Gently repacked.  Poor granulation tissue but clean.  No dehiscence.  No evidence of peritonitis.  No incarcerated hernias.  Ext:  No deformity.  No mjr edema.  No cyanosis Skin: No petechiae / purpura  Results:   Cultures: Recent Results (from the past 720 hour(s))  Novel Coronavirus, NAA (hospital order; send-out to ref lab)     Status: None   Collection Time: 04/17/19  2:03 PM   Specimen: Nasopharyngeal  Swab; Respiratory  Result Value Ref Range Status   SARS-CoV-2, NAA NOT DETECTED NOT DETECTED Final    Comment: (NOTE) This test was developed and its performance characteristics determined by [employer]. This test has not been FDA cleared or approved. This test has been authorized by FDA under an Emergency Use Authorization (EUA). This test is only authorized for the duration of time the declaration that circumstances exist justifying the  authorization of the emergency use of in vitro diagnostic tests for detection of SARS-CoV-2 virus and/or diagnosis of COVID-19 infection under section 564(b)(1) of the Act, 21 U.S.C. 417EYC-1(K)(4), unless the authorization is terminated or revoked sooner. When diagnostic testing is negative, the possibility of a false negative result should be considered in the context of a patient's recent exposures and the presence of clinical signs and symptoms consistent with COVID-19. An individual without symptoms of COVID-19 and who is not shedding SARS-CoV-2 virus would expect to have a negative (not detected) result in this assay. Performed  At: [facility] [address] [place], [place] [id number] Rush Farmer MD FW:[id number]    [place]  Final    Comment: Performed at [facility], [place] [address], [place], [place] [address]    Labs: Results for orders placed or performed during the hospital encounter of 04/20/19 (from the past 48 hour(s))  Basic metabolic panel     Status: Abnormal   Collection Time: 04/21/19  4:32 AM  Result Value Ref Range   Sodium 132 (L) 135 - 145 mmol/L   Potassium 5.2 (H) 3.5 - 5.1 mmol/L   Chloride 101 98 - 111 mmol/L   CO2 24 22 - 32 mmol/L   Glucose, Bld 130 (H) 70 - 99 mg/dL   BUN 17 8 - 23 mg/dL   Creatinine, Ser 1.05 (H) 0.44 - 1.00 mg/dL   Calcium 8.8 (L) 8.9 - 10.3 mg/dL   GFR calc non Af Amer 55 (L) >60 mL/min   GFR calc Af Amer >60 >60 mL/min   Anion gap 7 5 - 15    Comment: Performed at [facility], [place] [address], [place], [place] [address]  CBC     Status: Abnormal   Collection Time: 04/21/19  4:32 AM  Result Value Ref Range   WBC 15.6 (H) 4.0 - 10.5 K/uL   RBC 3.18 (L) 3.87 - 5.11 MIL/uL   Hemoglobin 10.1 (L) 12.0 - 15.0 g/dL   HCT 31.5 (L) 36.0 - 46.0 %   MCV 99.1 80.0 - 100.0 fL   MCH 31.8 26.0 - 34.0 pg   MCHC 32.1 30.0 - 36.0 g/dL   RDW 13.8 11.5 - 15.5 %   Platelets 279  150 - 400 K/uL   nRBC 0.0 0.0 - 0.2 %    Comment: Performed at [facility], [place] [address], [place], [place] [address]  Magnesium     Status: None   Collection Time: 04/21/19  4:32 AM  Result Value Ref Range   Magnesium 1.8 1.7 - 2.4 mg/dL    Comment: Performed at [facility], [place] [address], [place], [place] [address]  Phosphorus     Status: None   Collection Time: 04/21/19  4:32 AM  Result Value Ref Range   Phosphorus 2.6 2.5 - 4.6 mg/dL    Comment: Performed at [facility], [place] [address], [place], [place] [address]  Glucose, capillary     Status: None   Collection Time: 04/22/19  7:22 AM  Result Value Ref Range   Glucose-Capillary 75 70 - 99 mg/dL   Comment 1 Notify RN    Comment 2 Document in Chart     Imaging / Studies: No results found.  Medications / Allergies: per chart  Antibiotics: Anti-infectives (From admission, onward)   Start     Dose/Rate Route Frequency Ordered Stop   04/20/19 0600  cefoTEtan (CEFOTAN) 2 g in sodium chloride 0.9 % 100 mL IVPB     2 g 200 mL/hr over 30 Minutes Intravenous On call to O.R. 04/20/19 1859 04/20/19 0733        Note: Portions of this report may have been transcribed using voice recognition software. Every effort was made to ensure accuracy; however, inadvertent computerized transcription errors may be present.   Any transcriptional errors that result from this process are unintentional.     Adin Hector, MD, FACS, MASCRS Gastrointestinal and Minimally Invasive Surgery    [address] [place], [place] [address] [phone] Main / Paging [phone] Fax

## 2019-04-22 NOTE — Plan of Care (Signed)
Patient lying in bed this morning; pain controlled. No concerns or issues at this time. Will continue to monitor.

## 2019-04-22 NOTE — Progress Notes (Signed)
Patient up and ambulating in hall; feeling better. Dizziness and lightheadedness better. Ambulated 1 lap around unit and returned to room. Will continue to monitor.

## 2019-04-22 NOTE — Progress Notes (Signed)
Pharmacy Brief Note - Alvimopan (Entereg)  The standing order set for alvimopan (Entereg) now includes an automatic order to discontinue the drug after the patient has had a bowel movement.  The change was approved by the [place] and the Medical Executive Committee.    This patient has had a bowel movement documented by nursing.  Therefore, alvimopan has been discontinued.  If there are questions, please contact the pharmacy at [phone].    Thank you- Biagio Borg, [facility] 04/22/2019 12:34 PM

## 2019-04-22 NOTE — Progress Notes (Signed)
Patient sitting up in chair; complains of feeling dizzy and lightheaded; just not feeling well. CBG checked - 107. Vital signs checked; increased [place] flow from 1 LPM to 3 LPM to obtain sats 95%. Patient returned to bed to rest for awhile. Will continue to monitor.

## 2019-04-23 LAB — BASIC METABOLIC PANEL
Anion gap: 7 (ref 5–15)
BUN: 16 mg/dL (ref 8–23)
CO2: 28 mmol/L (ref 22–32)
Calcium: 8.6 mg/dL — ABNORMAL LOW (ref 8.9–10.3)
Chloride: 99 mmol/L (ref 98–111)
Creatinine, Ser: 0.89 mg/dL (ref 0.44–1.00)
GFR calc Af Amer: >60 mL/min (ref >60)
GFR calc non Af Amer: >60 mL/min (ref >60)
Glucose, Bld: 96 mg/dL (ref 70–99)
Potassium: 4.3 mmol/L (ref 3.5–5.1)
Sodium: 134 mmol/L — ABNORMAL LOW (ref 135–145)

## 2019-04-23 LAB — CBC WITH DIFFERENTIAL/PLATELET
Abs Immature Granulocytes: 0.04 10*3/uL (ref 0.00–0.07)
Basophils Absolute: 0.1 10*3/uL (ref 0.0–0.1)
Basophils Relative: 1 %
Eosinophils Absolute: 0.2 10*3/uL (ref 0.0–0.5)
Eosinophils Relative: 2 %
HCT: 31.5 % — ABNORMAL LOW (ref 36.0–46.0)
Hemoglobin: 10 g/dL — ABNORMAL LOW (ref 12.0–15.0)
Immature Granulocytes: 0 %
Lymphocytes Relative: 10 %
Lymphs Abs: 1.1 10*3/uL (ref 0.7–4.0)
MCH: 31.8 pg (ref 26.0–34.0)
MCHC: 31.7 g/dL (ref 30.0–36.0)
MCV: 100.3 fL — ABNORMAL HIGH (ref 80.0–100.0)
Monocytes Absolute: 0.9 10*3/uL (ref 0.1–1.0)
Monocytes Relative: 9 %
Neutro Abs: 8.1 10*3/uL — ABNORMAL HIGH (ref 1.7–7.7)
Neutrophils Relative %: 78 %
Platelets: 282 10*3/uL (ref 150–400)
RBC: 3.14 MIL/uL — ABNORMAL LOW (ref 3.87–5.11)
RDW: 14.4 % (ref 11.5–15.5)
WBC: 10.4 10*3/uL (ref 4.0–10.5)
nRBC: 0 % (ref 0.0–0.2)

## 2019-04-23 LAB — GLUCOSE, CAPILLARY: Glucose-Capillary: 75 mg/dL (ref 70–99)

## 2019-04-23 MED ORDER — LOPERAMIDE HCL 2 MG PO CAPS
2.0000 mg | ORAL_CAPSULE | Freq: Two times a day (BID) | ORAL | Status: DC
  Administered 2019-04-23 – 2019-04-24 (×3): 2 mg via ORAL
  Filled 2019-04-23 (×3): qty 1

## 2019-04-23 MED ORDER — IBUPROFEN 200 MG PO TABS: 600.0000 mg | ORAL_TABLET | Freq: Four times a day (QID) | ORAL | Status: DC | PRN

## 2019-04-23 MED ORDER — SODIUM CHLORIDE 0.9 % IV BOLUS
500.0000 mL | Freq: Once | INTRAVENOUS | Status: AC
  Administered 2019-04-23: 500 mL via INTRAVENOUS

## 2019-04-23 NOTE — Progress Notes (Signed)
04/23/2019  1530  Notified MD of patients BP 92/55. Waiting for response. Will continue to monitor.

## 2019-04-23 NOTE — Care Management Important Message (Signed)
Important Message  Patient Details IM Letter given to Velva Harman RN to present to the Patient Name: Deborah Arroyo MRN: [id number] Date of Birth: 19-Nov-1950   Medicare Important Message Given:  Yes     Kerin Salen 04/23/2019, 12:33 PM

## 2019-04-23 NOTE — Progress Notes (Addendum)
Subjective No acute events. Feeling reasonably well. Having some headaches and lightheadedness - feels like her sodium could be off. Denies emesis. Tolerating diet. Having flatus and bowel movements. Ambulating  Objective: Vital signs in last 24 hours: Temp:  [97.8 F (36.6 C)-97.9 F (36.6 C)] 97.9 F (36.6 C) (06/22 0448) Pulse Rate:  [84-94] 88 (06/22 0448) Resp:  [16-18] 18 (06/22 0448) BP: (96-114)/(58-69) 105/58 (06/22 0448) SpO2:  [93 %-97 %] 97 % (06/22 0448) Weight:  [47.8 kg] 47.8 kg (06/22 0500) Last BM Date: 04/22/19  Intake/Output from previous day: 06/21 0701 - 06/22 0700 In: 1200 [P.O.:1200] Out: 600 [Urine:600] Intake/Output this shift: No intake/output data recorded.  Gen: NAD, comfortable CV: RRR Pulm: Normal work of breathing Abd: Soft, minimal tenderness around stoma; no tenderness elsewhere. Ext: SCDs in place  Lab Results: CBC  Recent Labs    04/21/19 0432  WBC 15.6*  HGB 10.1*  HCT 31.5*  PLT 279   BMET Recent Labs    04/21/19 0432 04/23/19 0343  NA 132* 134*  K 5.2* 4.3  CL 101 99  CO2 24 28  GLUCOSE 130* 96  BUN 17 16  CREATININE 1.05* 0.89  CALCIUM 8.8* 8.6*   PT/INR No results for input(s): LABPROT, INR in the last 72 hours. ABG No results for input(s): PHART, HCO3 in the last 72 hours.  Invalid input(s): PCO2, PO2  Studies/Results:  Anti-infectives: Anti-infectives (From admission, onward)   Start     Dose/Rate Route Frequency Ordered Stop   04/20/19 0600  cefoTEtan (CEFOTAN) 2 g in sodium chloride 0.9 % 100 mL IVPB     2 g 200 mL/hr over 30 Minutes Intravenous On call to O.R. 04/20/19 0528 04/20/19 0733       Assessment/Plan: Patient Active Problem List   Diagnosis Date Noted  . Pressure injury of skin 04/22/2019  . Fecal incontinence with diarrhea 04/22/2019  . S/P closure of ileostomy 04/20/2019  . Acute right-sided low back pain without sciatica 08/10/2018  . 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  Supplemental oxygen dependent 07/21/2018  . Hypokalemia 07/21/2018  . Hypomagnesemia 07/21/2018  . Hypoxemia 07/13/2018  . Great toe pain, right 04/02/2018  . Rectal cancer s/p LAR/diverting loop ileostomy 07/12/2018 03/06/2018  . Osteopenia 12/15/2017  . Cervicogenic headache 09/08/2017  . Rectal mass 08/25/2017  . Bilateral knee pain 08/25/2017  . Health care maintenance 09/04/2015  . Tobacco abuse 09/04/2015   s/p Procedure(s): OPEN TAKEDOWN OF ILEOSTOMY, primary repair of parastomal hernia 04/20/2019  -Encourage to drink plenty of liquids -Ambulate 5x/day -Diet as tolerated -On intermittent 2L O2 -wean back off today -586mL NS; since Saturday has had 2 large BM 3 small BM. Will do BID imodium for now; continue psyllium -PPx: SQH, SCDs -Dispo: Possible discharge home tomorrow if continues to do well, off O2, etc -I have attempted to reach out to her sister Alliene twice today to no avail to update her on his progress   LOS: 3 days   Sharon Mt. Dema Severin, M.D. [facility]

## 2019-04-24 LAB — GLUCOSE, CAPILLARY: Glucose-Capillary: 77 mg/dL (ref 70–99)

## 2019-04-24 MED ORDER — PSYLLIUM 95 % PO PACK: 1.0000 | PACK | Freq: Every day | ORAL | 1 refills | Status: DC

## 2019-04-24 MED ORDER — OXYCODONE HCL 5 MG PO TABS: 5.0000 mg | ORAL_TABLET | Freq: Four times a day (QID) | ORAL | 0 refills | Status: AC | PRN

## 2019-04-24 MED ORDER — LOPERAMIDE HCL 2 MG PO CAPS: 2.0000 mg | ORAL_CAPSULE | Freq: Two times a day (BID) | ORAL | 0 refills | Status: DC

## 2019-04-24 NOTE — Progress Notes (Signed)
Discharge instructions given to patient. Patient had no questions. Patient waiting on family to come pick her up. Once family comes in. Writer or NT will wheel patient out the front

## 2019-04-24 NOTE — Discharge Summary (Signed)
Patient ID: Deborah Arroyo MRN: [id number] DOB/AGE: Sep 25, 1951 68 y.o.  Admit date: 04/20/2019 Discharge date: 04/24/2019  Discharge Diagnoses Patient Active Problem List   Diagnosis Date Noted  . Pressure injury of skin 04/22/2019  . Fecal incontinence with diarrhea 04/22/2019  . S/P closure of ileostomy 04/20/2019  . Acute right-sided low back pain without sciatica 08/10/2018  . 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 Supplemental oxygen dependent 07/21/2018  . Hypokalemia 07/21/2018  . Hypomagnesemia 07/21/2018  . Hypoxemia 07/13/2018  . Great toe pain, right 04/02/2018  . Rectal cancer s/p LAR/diverting loop ileostomy 07/12/2018 03/06/2018  . Osteopenia 12/15/2017  . Cervicogenic headache 09/08/2017  . Rectal mass 08/25/2017  . Bilateral knee pain 08/25/2017  . Health care maintenance 09/04/2015  . 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 This was all resected. She had a small bowel containing parastomal hernia that was essentially additional ileum proximal to the stoma within the subcutaneous space.  She was admitted postoperatively and recovered relatively uneventfully. Her diet was gradually advanced and she began having bowel function. This was watery and with some control issues not surprisingly. This improved with fiber and BID imodium. On 04/24/2019, she was tolerating a regular diet, ambulating on her own, pain controlled on oral analgesics and deemed stable for discharge home. Wound site care had been taught and consisted of placing dry dressing over closure site daily. She demonstrated this ability.   Allergies as of 04/24/2019      Reactions   Aleve [naproxen Sodium] Swelling, Other (See Comments)   Eyes and  throat swell up      Medication List    TAKE these medications   acetaminophen 500 MG tablet Commonly known as: TYLENOL Take 500 mg by mouth every 6 (six) hours as needed for moderate pain.   cyclobenzaprine 10 MG tablet Commonly known as: FLEXERIL TAKE 1 TABLET BY MOUTH THREE TIMES DAILYAS NEEDED FOR MUSCLE SPASMS   loperamide 2 MG capsule Commonly known as: IMODIUM Take 1 capsule (2 mg total) by mouth 2 (two) times daily.   oxyCODONE 5 MG immediate release tablet Commonly known as: Roxicodone Take 1 tablet (5 mg total) by mouth every 6 (six) hours as needed for up to 7 days (acute postop pain uncontrolled by tylenol/ibuprofen).   psyllium 95 % Pack Commonly known as: HYDROCIL/METAMUCIL Take 1 packet by mouth daily.       Sharon Mt. Dema Severin, M.D. [facility]

## 2019-04-24 NOTE — Discharge Instructions (Addendum)
POST OP INSTRUCTIONS AFTER COLON SURGERY  1. DIET: Be sure to include lots of fluids daily to stay hydrated - 64oz of water per day (8, 8 oz glasses).  Avoid fast food or heavy meals for the first couple of weeks as your are more likely to get nauseated. Avoid raw/uncooked fruits or vegetables for the first 4 weeks (its ok to have these if they are blended into smoothie form). If you have fruits/vegetables, make sure they are cooked until soft enough to mash on the roof of your mouth and chew your food well. Otherwise, diet as tolerated.  2. Take your usually prescribed home medications unless otherwise directed.  3. PAIN CONTROL: a. Pain is best controlled by a usual combination of three different methods TOGETHER: i. Ice/Heat ii. Over the counter pain medication iii. Prescription pain medication b. Most patients will experience some swelling and bruising around the surgical site.  Ice packs or heating pads (30-60 minutes up to 6 times a day) will help. Some people prefer to use ice alone, heat alone, alternating between ice & heat.  Experiment to what works for you.  Swelling and bruising can take several weeks to resolve.   c. It is helpful to take an over-the-counter pain medication regularly for the first few weeks: i. Ibuprofen (Motrin/Advil) - 200mg  tabs - take 3 tabs (600mg ) every 6 hours as needed for pain (unless you have been directed previously to avoid NSAIDs/ibuprofen) ii. Acetaminophen (Tylenol) - you may take 650mg  every 6 hours as needed. You can take this with motrin as they act differently on the body. If you are taking a narcotic pain medication that has acetaminophen in it, do not take over the counter tylenol at the same time. iii. NOTE: You may take both of these medications together - most patients  find it most helpful when alternating between the two (i.e. Ibuprofen at 6am, tylenol at 9am, ibuprofen at 12pm ...) d. A  prescription for pain medication should be given to you  upon discharge.  Take your pain medication as prescribed if your pain is not adequatly controlled with the over-the-counter pain reliefs mentioned above. e. Additionally, you have been prescribed fiber (psyllium) - take this once daily to help thicken the stool. You also have been prescribed imodium (also available over the counter) - this is an antidiarrheal. If you experience constipation, discontinue this medication. Otherwise, you may take this two times per day (once in morning and once in evening) to help reduce watery stool.  4. Avoid getting constipated.  Between the surgery and the pain medications, it is common to experience some constipation.  Increasing fluid intake and taking a fiber supplement (such as Metamucil, Citrucel, FiberCon, MiraLax, etc) 1-2 times a day regularly will usually help prevent this problem from occurring.  A mild laxative (prune juice, Milk of Magnesia, MiraLax, etc) should be taken according to package directions if there are no bowel movements after 48 hours.    5. Dressing: You may bathe normally in a shower. Remove dressing, get soap/water over wound and rinse. Following shower, cover your ileostomy closure site with dry gauze to protect your clothing. Avoid baths/pools/lakes/oceans until your wounds have fully healed. Until this fully closes, it is normal to have some drainage from this wound.  6. ACTIVITIES as tolerated:   a. Avoid heavy lifting (>10lbs or 1 gallon of milk) for the next 6 weeks. b. You may resume regular daily activities as tolerated--such as daily self-care, walking, climbing stairs--gradually increasing  activities as tolerated.  If you can walk 30 minutes without difficulty, it is safe to try more intense activity such as jogging, treadmill, bicycling, low-impact aerobics.  c. DO NOT PUSH THROUGH PAIN.  Let pain be your guide: If it hurts to do something, don't do it. d. Dennis Bast may drive when you are no longer taking prescription pain medication,  you can comfortably wear a seatbelt, and you can safely maneuver your car and apply brakes. e. Dennis Bast may have sexual intercourse when it is comfortable.   7. FOLLOW UP in our office a. Please call [facility] at [phone] to set up an appointment to see your surgeon in the office for a follow-up appointment. b. Make sure that you call for this appointment the day you arrive home to insure a convenient appointment time.  9. If you have disability or family leave forms that need to be completed, you may have them completed by your primary care physician's office; for return to work instructions, please ask our office staff and they will be happy to assist you in obtaining this documentation   When to call us [phone]: 1. Poor pain control 2. Reactions / problems with new medications (rash/itching, etc)  3. Fever over 101.5 F (38.5 C) 4. Inability to urinate 5. Nausea/vomiting 6. Worsening swelling or bruising 7. Continued bleeding from incision. 8. Increased pain, redness, or drainage from the incision  The clinic staff is available to answer your questions during regular business hours (8:30am-5pm).  Please dont hesitate to call and ask to speak to one of our nurses for clinical concerns.   A surgeon from [facility] is always on call at the hospitals   If you have a medical emergency, go to the nearest emergency room or call 911.  [facility], [place] [address] [place], [place],   [address] MAIN: [phone] FAX: [phone] [email or web address]

## 2019-04-24 NOTE — TOC Transition Note (Signed)
Transition of Care [facility]) - CM/SW Discharge Note   Patient Details  Name: Deborah Arroyo MRN: [id number] Date of Birth: [birth year]  [facility] CM/SW Contact:  Leeroy Cha, RN Phone Number: 04/24/2019, 10:36 AM   Clinical Narrative:    dcd to home with self care   Final next level of care: Home/Self Care Barriers to Discharge: No Barriers Identified   Patient Goals and CMS Choice Patient states their goals for this hospitalization and ongoing recovery are:: to go home CMS Medicare.gov Compare Post Acute Care list provided to:: Patient    Discharge Placement                       Discharge Plan and Services   Discharge Planning Services: CM Consult                                 Social Determinants of Health (SDOH) Interventions     Readmission Risk Interventions No flowsheet data found.

## 2019-05-07 ENCOUNTER — Telehealth: Payer: Self-pay | Admitting: Internal Medicine

## 2019-05-07 NOTE — Telephone Encounter (Signed)
Requesting results from [facility] and will call patient when I have results. Thanks!

## 2019-05-07 NOTE — Telephone Encounter (Signed)
Pt is calling because her bone destin results can you please contact pt [phone]

## 2019-05-07 NOTE — Telephone Encounter (Signed)
Called patient to discuss bone density scan results. She would like to come in to discuss further and treatment plan. She is fairly booked with follow-ups between surgeon and oncologist. She preferred my available slot on 8/10.

## 2019-05-14 ENCOUNTER — Other Ambulatory Visit: Payer: Self-pay | Admitting: *Deleted

## 2019-05-14 DIAGNOSIS — C20 Malignant neoplasm of rectum: Secondary | ICD-10-CM

## 2019-05-15 ENCOUNTER — Inpatient Hospital Stay: Payer: Medicare Other

## 2019-05-15 ENCOUNTER — Other Ambulatory Visit: Payer: Self-pay

## 2019-05-15 ENCOUNTER — Inpatient Hospital Stay: Payer: Medicare Other | Attending: Oncology | Admitting: Oncology

## 2019-05-15 ENCOUNTER — Telehealth: Payer: Self-pay | Admitting: Oncology

## 2019-05-15 VITALS — BP 109/79 | HR 77 | Temp 98.0°F | Resp 18 | Ht 59.0 in | Wt 101.2 lb

## 2019-05-15 DIAGNOSIS — Z809 Family history of malignant neoplasm, unspecified: Secondary | ICD-10-CM | POA: Diagnosis not present

## 2019-05-15 DIAGNOSIS — C20 Malignant neoplasm of rectum: Secondary | ICD-10-CM

## 2019-05-15 DIAGNOSIS — R159 Full incontinence of feces: Secondary | ICD-10-CM | POA: Insufficient documentation

## 2019-05-15 DIAGNOSIS — Z9221 Personal history of antineoplastic chemotherapy: Secondary | ICD-10-CM | POA: Diagnosis not present

## 2019-05-15 DIAGNOSIS — Z85048 Personal history of other malignant neoplasm of rectum, rectosigmoid junction, and anus: Secondary | ICD-10-CM | POA: Diagnosis not present

## 2019-05-15 DIAGNOSIS — J449 Chronic obstructive pulmonary disease, unspecified: Secondary | ICD-10-CM

## 2019-05-15 DIAGNOSIS — F17210 Nicotine dependence, cigarettes, uncomplicated: Secondary | ICD-10-CM | POA: Diagnosis not present

## 2019-05-15 LAB — CEA (IN HOUSE-CHCC): CEA (CHCC-In House): 3.1 ng/mL (ref 0.00–5.00)

## 2019-05-15 NOTE — Progress Notes (Signed)
  [place] OFFICE PROGRESS NOTE   Diagnosis: Rectal cancer  INTERVAL HISTORY:   Deborah Arroyo returns for a scheduled visit.  She underwent takedown of the ileostomy on 04/20/2019.  The ostomy site is healing.  She has approximately 3 bowel movements per day.  She wears a diaper secondary to incontinence.  Objective:  Vital signs in last 24 hours:  Blood pressure 109/79, pulse 77, temperature 98 F (36.7 C), temperature source Oral, resp. rate 18, height _0  (1.499 m), weight 101 lb 3.2 oz (45.9 kg), SpO2 100 %.    HEENT: Neck without mass Lymphatics: No cervical, supraclavicular, or inguinal nodes.  "Shotty "bilateral axillary nodes GI: No hepatomegaly, healing ileostomy site, no mass Vascular: No leg edema   Lab Results:  Lab Results  Component Value Date   WBC 10.4 04/23/2019   HGB 10.0 (L) 04/23/2019   HCT 31.5 (L) 04/23/2019   MCV 100.3 (H) 04/23/2019   PLT 282 04/23/2019   NEUTROABS 8.1 (H) 04/23/2019    CMP  Lab Results  Component Value Date   NA 134 (L) 04/23/2019   K 4.3 04/23/2019   CL 99 04/23/2019   CO2 28 04/23/2019   GLUCOSE 96 04/23/2019   BUN 16 04/23/2019   CREATININE 0.89 04/23/2019   CALCIUM 8.6 (L) 04/23/2019   PROT 7.7 04/17/2019   ALBUMIN 4.3 04/17/2019   AST 20 04/17/2019   ALT 17 04/17/2019   ALKPHOS 93 04/17/2019   BILITOT 0.4 04/17/2019   GFRNONAA >60 04/23/2019   GFRAA >60 04/23/2019    Lab Results  Component Value Date   CEA1 3.60 01/04/2019     Medications: I have reviewed the patient's current medications.   Assessment/Plan: 1. Rectal cancer, obstructing rectal mass on colonoscopy 03/01/2018-biopsy confirmed adenocarcinoma ? Incomplete colonoscopy secondary to obstructing tumor ? Staging CTs on 03/03/2018-small mediastinal/hilar lymph nodes, indeterminate lingula nodule, rectal mass, small perirectal lymph nodes, mass measured at approximately 4.9 cm from the anal verge ? MRI pelvis 03/11/2018-T3c,N2 ?  Radiation/Xeloda 04/03/2018-05/11/2018 ? Colonoscopy 07/04/2018- mass at 8 cm from the dentate line, polypsremoved from the hepatic flexure and descending colon-tubular adenomas ? Low anterior resection/ileostomy 07/12/2018, T3N0 moderately differentiated adenocarcinoma, margins negative, 11- lymph nodes, no lymphovascular or perineural invasion, MSI-stable, no loss of mismatch repair protein expression ? Cycle 1 adjuvant Xeloda 08/07/2018 ? Cycle 2 adjuvant Xeloda 08/28/2018 ? Cycle 3 adjuvant Xeloda 09/18/2018 ? Cycle 4 adjuvant Xeloda 10/09/2018(Xeloda held 10/13/2018 pm dose, 10/14/2018 and 10/15/2018; resumed 10/16/2018 at a reduced dose of 1000 mg every morning and 500 mg every afternoon for the remainder of the cycle) ? Cycle 5 adjuvant Xeloda 10/30/2018 ? CTs 01/04/2019- low anterior resection with a right lower quadrant ileostomy, no evidence of metastatic disease ? Takedown of diverting loop ileostomy and closure of parastomal hernia 04/20/2019 2. Tobacco use 3. COPD 4. Family history of multiple cancers   Disposition: Ms.  Laughter is in clinical remission from rectal cancer.  We will follow-up on the CEA from today.  She will be referred to Dr. Hilarie Fredrickson for a surveillance colonoscopy.  She declines a referral for pelvic physical therapy.  Ms. Vanaken will return for an office visit in 6 months.  Betsy Coder, MD  05/15/2019  3:15 PM

## 2019-05-15 NOTE — Telephone Encounter (Signed)
Scheduled appt per 7/14 los,  Printed calendar and avs.

## 2019-05-16 ENCOUNTER — Telehealth: Payer: Self-pay

## 2019-05-16 NOTE — Telephone Encounter (Signed)
Left pt vm msg to c/b in regards to lab results.  Per Dr Benay Spice, please call patient, CEA is normal, follow-up as scheduled

## 2019-05-16 NOTE — Telephone Encounter (Signed)
-----   Message from Ladell Pier, MD sent at 05/15/2019  4:38 PM EDT ----- Please call patient, CEA is normal, follow-up as scheduled

## 2019-06-07 ENCOUNTER — Telehealth: Payer: Self-pay | Admitting: *Deleted

## 2019-06-07 NOTE — Telephone Encounter (Signed)
I have spoken to patient. She states that she is not on oxygen and has never used supplemental oxygen (apparently this was just an issue for her while hospitalized from what I can figure) so she would be able to have her procedure in [place]. However, patient would first like to confirm that she would be able to have colonoscopy without difficulty since her loop ileostomy takedown was completed 04/20/19. Dr Hilarie Fredrickson please advise.Marland KitchenMarland KitchenMarland Kitchen

## 2019-06-07 NOTE — Telephone Encounter (Addendum)
Left message for patient to call back. Dr Hilarie Fredrickson has reviewed her chart and feels it is time for her to schedule colonoscopy due to her previous history of colon cancer 03/01/18 (she now has diverting loop ileostomy) and history of adenomatous polyps. We will need to see if she uses supplemental oxygen as needed (this is in a note from Dr Johney Maine 07/21/18 in hospital but there are no other notes indicating this in her chart.). If patient does, indeed use oxygen, she will need to have her procedure completed at the hospital. We currently have an availability on 07/10/19.

## 2019-06-07 NOTE — Telephone Encounter (Signed)
Pt returned your call.  

## 2019-06-11 ENCOUNTER — Other Ambulatory Visit: Payer: Self-pay

## 2019-06-11 ENCOUNTER — Encounter: Payer: Self-pay | Admitting: Internal Medicine

## 2019-06-11 ENCOUNTER — Encounter (INDEPENDENT_AMBULATORY_CARE_PROVIDER_SITE_OTHER): Payer: Self-pay

## 2019-06-11 ENCOUNTER — Ambulatory Visit (INDEPENDENT_AMBULATORY_CARE_PROVIDER_SITE_OTHER): Payer: Medicare Other | Admitting: Internal Medicine

## 2019-06-11 DIAGNOSIS — R152 Fecal urgency: Secondary | ICD-10-CM

## 2019-06-11 DIAGNOSIS — Z720 Tobacco use: Secondary | ICD-10-CM

## 2019-06-11 DIAGNOSIS — Z85048 Personal history of other malignant neoplasm of rectum, rectosigmoid junction, and anus: Secondary | ICD-10-CM

## 2019-06-11 DIAGNOSIS — M810 Age-related osteoporosis without current pathological fracture: Secondary | ICD-10-CM

## 2019-06-11 DIAGNOSIS — Z9221 Personal history of antineoplastic chemotherapy: Secondary | ICD-10-CM

## 2019-06-11 DIAGNOSIS — R159 Full incontinence of feces: Secondary | ICD-10-CM

## 2019-06-11 DIAGNOSIS — Z79899 Other long term (current) drug therapy: Secondary | ICD-10-CM

## 2019-06-11 DIAGNOSIS — R197 Diarrhea, unspecified: Secondary | ICD-10-CM

## 2019-06-11 DIAGNOSIS — Z7983 Long term (current) use of bisphosphonates: Secondary | ICD-10-CM

## 2019-06-11 DIAGNOSIS — Z923 Personal history of irradiation: Secondary | ICD-10-CM

## 2019-06-11 MED ORDER — CALCIUM CARBONATE 1500 (600 CA) MG PO TABS: 600.0000 mg | ORAL_TABLET | Freq: Two times a day (BID) | ORAL | 11 refills | Status: DC

## 2019-06-11 MED ORDER — ALENDRONATE SODIUM 70 MG PO TABS: 70.0000 mg | ORAL_TABLET | ORAL | 11 refills | Status: DC

## 2019-06-11 NOTE — Patient Instructions (Addendum)
Ms. Savino, It was a pleasure seeing you! I'm glad things are going well. 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 I'd like to start you on a medication to help prevent your risk of fractures.  The Alendronate you will take once weekly as discussed. First thing in the morning with a full glass of water at least 30 minutes before you eat or drink anything else. Be sure to stay sitting up for 30 minutes after you take this medication.  The calcium and vitamin D supplementation you will take twice daily at mealtimes.   Continue to work on cutting back on smoking, as this will also help decrease your risk of fracture with osteoporosis.    We'll plan to see you back sometime next year! Please call sooner with any questions or concerns that come up.   Take care, Dr. Koleen Distance

## 2019-06-12 NOTE — Telephone Encounter (Signed)
Should be ok to proceed in [place] with standard prep Given her loop ileostomy has been taken down, her small and large bowel are now connected and I would expect her to do well

## 2019-06-12 NOTE — Telephone Encounter (Signed)
I have spoken to patient to advise that per Dr Hilarie Fredrickson, he feels that she should be fine to undergo colonoscopy in [place] with standard prep after undergoing ileostomy take down. She has scheduled colonoscopy for 07/05/19 at 2 pm and previsit for 06/20/19 at 830 am.

## 2019-06-14 NOTE — Progress Notes (Signed)
   CC: osteoporosis   HPI:  [name] is a 68 y.o. female with PMHx listed below who presents for follow-up on her recent DEXA scan.  Results showed T score of -2.6 in lumbar spine which is a progression from osteopenia range to osteoporosis. She denies any back pain, falls, or history of fractures. She is doing well overall. Diagnosed with rectal cancer last year and is now s/p resection, loop ileostomy with takedown, chemo and radiation. Since ileostomy reversal in June, she endorses loose stools and intermittent issue with bowel control. She has been able to gain approximately 7 lbs.    Past Medical History:  Diagnosis Date  . Benign neoplasm of descending colon   . Benign neoplasm of transverse colon   . Cancer ([place]) 03/01/2018   rectal cancer=had chemo pills and radation   . Cataract   . Cervix cancer ([place]) 1979  . Depression   . Dyspnea   . 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 Hypoglycemia   . Hyponatremia    syncope with this and admitted twice due to this issue   . Osteopenia   . Rectal bleeding   . Rectal cancer s/p LAR/diverting loop ileostomy 07/12/2018 03/06/2018  . Smoker    Review of Systems:   Review of Systems  Constitutional: Negative for chills, fever and weight loss.  Eyes: Negative for blurred vision.  Respiratory: Negative for cough and shortness of breath.   Cardiovascular: Negative for chest pain, palpitations and leg swelling.  Gastrointestinal: Negative for abdominal pain, blood in stool, melena, nausea and vomiting.  Genitourinary: Negative for dysuria.  Musculoskeletal: Negative for falls and joint pain.  Skin: Negative for rash.  Neurological: Negative for dizziness, focal weakness and headaches.  Psychiatric/Behavioral: Negative for depression.    Physical Exam:  Vitals:   06/11/19 1311  BP: 136/69  Pulse: 73  Temp: 98.6 F (37 C)  TempSrc: Oral  SpO2: 98%  Weight: 100 lb 12.8 oz (45.7 kg)   Physical Exam Constitutional:     General: She is not in acute distress.    Appearance: Normal appearance.  Eyes:     Conjunctiva/sclera: Conjunctivae normal.  Neck:     Musculoskeletal: Neck supple.  Cardiovascular:     Rate and Rhythm: Normal rate and regular rhythm.     Pulses: Normal pulses.  Pulmonary:     Effort: Pulmonary effort is normal.     Breath sounds: Normal breath sounds.  Abdominal:     General: Abdomen is flat. A surgical scar is present. Bowel sounds are normal.     Palpations: Abdomen is soft.     Tenderness: There is generalized abdominal tenderness. There is no guarding or rebound.     Hernia: No hernia is present.  Lymphadenopathy:     Cervical: No cervical adenopathy.  Skin:    General: Skin is warm and dry.  Neurological:     General: No focal deficit present.     Mental Status: She is alert and oriented to person, place, and time.     Gait: Gait normal.  Psychiatric:        Mood and Affect: Mood normal.        Behavior: Behavior normal.     Assessment & Plan:   See Encounters Tab for problem based charting.  Patient discussed with Dr. Rebeca Alert

## 2019-06-14 NOTE — Assessment & Plan Note (Signed)
S/p ileostomy reversal 2 months ago. Stools are still loose with intermittent incontinence. She was advised that this should slowly resolve with time as her GI tract gets accustomed to normal functioning again.

## 2019-06-14 NOTE — Assessment & Plan Note (Signed)
Recent DEXA scan shows progression to osteoporosis in lumbar spine. Had discussion with patient today regarding bisphosphonate therapy and how to properly take medication. Also discussed starting calcium and vit D supplementation which she is agreeable to.   - start Alendronate 70 mg q weekly  - start daily Ca and Vit-D

## 2019-06-15 ENCOUNTER — Encounter: Payer: Self-pay | Admitting: Internal Medicine

## 2019-06-18 NOTE — Progress Notes (Signed)
Internal Medicine Clinic Attending  Case discussed with Dr. Bloomfield at the time of the visit.  We reviewed the resident's history and exam and pertinent patient test results.  I agree with the assessment, diagnosis, and plan of care documented in the resident's note.  Alexander Raines, M.D., Ph.D.  

## 2019-06-19 ENCOUNTER — Inpatient Hospital Stay (HOSPITAL_COMMUNITY)
Admission: EM | Admit: 2019-06-19 | Discharge: 2019-06-21 | DRG: 389 | Disposition: A | Discharge status: Home / Self Care | Payer: Medicare Other | Attending: Family Medicine | Admitting: Family Medicine

## 2019-06-19 ENCOUNTER — Other Ambulatory Visit: Payer: Self-pay

## 2019-06-19 ENCOUNTER — Encounter (HOSPITAL_COMMUNITY): Payer: Self-pay | Admitting: Emergency Medicine

## 2019-06-19 DIAGNOSIS — Z20828 Contact with and (suspected) exposure to other viral communicable diseases: Secondary | ICD-10-CM | POA: Diagnosis present

## 2019-06-19 DIAGNOSIS — E871 Hypo-osmolality and hyponatremia: Secondary | ICD-10-CM | POA: Diagnosis not present

## 2019-06-19 DIAGNOSIS — K56609 Unspecified intestinal obstruction, unspecified as to partial versus complete obstruction: Secondary | ICD-10-CM | POA: Diagnosis present

## 2019-06-19 DIAGNOSIS — K56600 Partial intestinal obstruction, unspecified as to cause: Principal | ICD-10-CM | POA: Diagnosis present

## 2019-06-19 DIAGNOSIS — R0902 Hypoxemia: Secondary | ICD-10-CM | POA: Diagnosis not present

## 2019-06-19 DIAGNOSIS — Z79899 Other long term (current) drug therapy: Secondary | ICD-10-CM

## 2019-06-19 DIAGNOSIS — Z9049 Acquired absence of other specified parts of digestive tract: Secondary | ICD-10-CM

## 2019-06-19 DIAGNOSIS — Z85048 Personal history of other malignant neoplasm of rectum, rectosigmoid junction, and anus: Secondary | ICD-10-CM | POA: Diagnosis not present

## 2019-06-19 DIAGNOSIS — D72829 Elevated white blood cell count, unspecified: Secondary | ICD-10-CM | POA: Diagnosis present

## 2019-06-19 DIAGNOSIS — Z923 Personal history of irradiation: Secondary | ICD-10-CM

## 2019-06-19 DIAGNOSIS — R110 Nausea: Secondary | ICD-10-CM | POA: Diagnosis not present

## 2019-06-19 DIAGNOSIS — Z90710 Acquired absence of both cervix and uterus: Secondary | ICD-10-CM

## 2019-06-19 DIAGNOSIS — Z432 Encounter for attention to ileostomy: Secondary | ICD-10-CM | POA: Diagnosis not present

## 2019-06-19 DIAGNOSIS — R109 Unspecified abdominal pain: Secondary | ICD-10-CM | POA: Diagnosis present

## 2019-06-19 DIAGNOSIS — J811 Chronic pulmonary edema: Secondary | ICD-10-CM | POA: Diagnosis not present

## 2019-06-19 DIAGNOSIS — Z0189 Encounter for other specified special examinations: Secondary | ICD-10-CM

## 2019-06-19 DIAGNOSIS — Z8541 Personal history of malignant neoplasm of cervix uteri: Secondary | ICD-10-CM

## 2019-06-19 DIAGNOSIS — R1110 Vomiting, unspecified: Secondary | ICD-10-CM | POA: Diagnosis not present

## 2019-06-19 DIAGNOSIS — Z720 Tobacco use: Secondary | ICD-10-CM | POA: Diagnosis present

## 2019-06-19 DIAGNOSIS — R1084 Generalized abdominal pain: Secondary | ICD-10-CM

## 2019-06-19 DIAGNOSIS — R112 Nausea with vomiting, unspecified: Secondary | ICD-10-CM | POA: Diagnosis not present

## 2019-06-19 DIAGNOSIS — F17210 Nicotine dependence, cigarettes, uncomplicated: Secondary | ICD-10-CM | POA: Diagnosis present

## 2019-06-19 DIAGNOSIS — Z9221 Personal history of antineoplastic chemotherapy: Secondary | ICD-10-CM | POA: Diagnosis not present

## 2019-06-19 DIAGNOSIS — K5669 Other partial intestinal obstruction: Secondary | ICD-10-CM | POA: Diagnosis not present

## 2019-06-19 LAB — URINALYSIS, ROUTINE W REFLEX MICROSCOPIC
Bacteria, UA: NONE SEEN
Bilirubin Urine: NEGATIVE
Glucose, UA: NEGATIVE mg/dL
Ketones, ur: 20 mg/dL — AB
Leukocytes,Ua: NEGATIVE
Nitrite: NEGATIVE
Protein, ur: NEGATIVE mg/dL
Specific Gravity, Urine: 1.016 (ref 1.005–1.030)
pH: 6 (ref 5.0–8.0)

## 2019-06-19 LAB — COMPREHENSIVE METABOLIC PANEL
ALT: 12 U/L (ref 0–44)
AST: 18 U/L (ref 15–41)
Albumin: 3.9 g/dL (ref 3.5–5.0)
Alkaline Phosphatase: 83 U/L (ref 38–126)
Anion gap: 12 (ref 5–15)
BUN: 13 mg/dL (ref 8–23)
CO2: 24 mmol/L (ref 22–32)
Calcium: 9.6 mg/dL (ref 8.9–10.3)
Chloride: 94 mmol/L — ABNORMAL LOW (ref 98–111)
Creatinine, Ser: 0.89 mg/dL (ref 0.44–1.00)
GFR calc Af Amer: >60 mL/min (ref >60)
GFR calc non Af Amer: >60 mL/min (ref >60)
Glucose, Bld: 119 mg/dL — ABNORMAL HIGH (ref 70–99)
Potassium: 4.2 mmol/L (ref 3.5–5.1)
Sodium: 130 mmol/L — ABNORMAL LOW (ref 135–145)
Total Bilirubin: 0.3 mg/dL (ref 0.3–1.2)
Total Protein: 7 g/dL (ref 6.5–8.1)

## 2019-06-19 LAB — CBC
HCT: 38.1 % (ref 36.0–46.0)
Hemoglobin: 12.5 g/dL (ref 12.0–15.0)
MCH: 30.8 pg (ref 26.0–34.0)
MCHC: 32.8 g/dL (ref 30.0–36.0)
MCV: 93.8 fL (ref 80.0–100.0)
Platelets: 325 10*3/uL (ref 150–400)
RBC: 4.06 MIL/uL (ref 3.87–5.11)
RDW: 12.4 % (ref 11.5–15.5)
WBC: 12.3 10*3/uL — ABNORMAL HIGH (ref 4.0–10.5)
nRBC: 0 % (ref 0.0–0.2)

## 2019-06-19 LAB — LIPASE, BLOOD: Lipase: 29 U/L (ref 11–51)

## 2019-06-19 MED ORDER — IOHEXOL 300 MG/ML  SOLN
30.0000 mL | Freq: Once | INTRAMUSCULAR | Status: AC | PRN
  Administered 2019-06-19: 30 mL via ORAL

## 2019-06-19 MED ORDER — MORPHINE SULFATE (PF) 4 MG/ML IV SOLN
4.0000 mg | Freq: Once | INTRAVENOUS | Status: AC
  Administered 2019-06-19: 4 mg via INTRAVENOUS
  Filled 2019-06-19: qty 1

## 2019-06-19 MED ORDER — ONDANSETRON HCL 4 MG/2ML IJ SOLN
4.0000 mg | Freq: Once | INTRAMUSCULAR | Status: AC
  Administered 2019-06-19: 4 mg via INTRAVENOUS
  Filled 2019-06-19: qty 2

## 2019-06-19 MED ORDER — SODIUM CHLORIDE 0.9 % IV BOLUS
1000.0000 mL | Freq: Once | INTRAVENOUS | Status: AC
  Administered 2019-06-19: 1000 mL via INTRAVENOUS

## 2019-06-19 NOTE — ED Triage Notes (Signed)
Patient was picked up by [facility] in her car. Patient started complaining of abdominal and back pain that's radiating to her back. 10/10. 4mg  of zofran and 100 mcg of Fentanyl. Pain is not 7/10. Hx of rectal cancer.

## 2019-06-20 ENCOUNTER — Emergency Department (HOSPITAL_COMMUNITY): Payer: Medicare Other

## 2019-06-20 ENCOUNTER — Encounter (HOSPITAL_COMMUNITY): Payer: Self-pay

## 2019-06-20 ENCOUNTER — Telehealth: Payer: Self-pay | Admitting: *Deleted

## 2019-06-20 ENCOUNTER — Other Ambulatory Visit: Payer: Self-pay

## 2019-06-20 DIAGNOSIS — R0902 Hypoxemia: Secondary | ICD-10-CM | POA: Diagnosis present

## 2019-06-20 DIAGNOSIS — K56609 Unspecified intestinal obstruction, unspecified as to partial versus complete obstruction: Secondary | ICD-10-CM | POA: Diagnosis not present

## 2019-06-20 DIAGNOSIS — Z9221 Personal history of antineoplastic chemotherapy: Secondary | ICD-10-CM | POA: Diagnosis not present

## 2019-06-20 DIAGNOSIS — Z923 Personal history of irradiation: Secondary | ICD-10-CM | POA: Diagnosis not present

## 2019-06-20 DIAGNOSIS — R109 Unspecified abdominal pain: Secondary | ICD-10-CM | POA: Diagnosis present

## 2019-06-20 DIAGNOSIS — D72829 Elevated white blood cell count, unspecified: Secondary | ICD-10-CM | POA: Diagnosis present

## 2019-06-20 DIAGNOSIS — K5669 Other partial intestinal obstruction: Secondary | ICD-10-CM | POA: Diagnosis not present

## 2019-06-20 DIAGNOSIS — K56600 Partial intestinal obstruction, unspecified as to cause: Secondary | ICD-10-CM | POA: Diagnosis present

## 2019-06-20 DIAGNOSIS — J811 Chronic pulmonary edema: Secondary | ICD-10-CM | POA: Diagnosis not present

## 2019-06-20 DIAGNOSIS — E871 Hypo-osmolality and hyponatremia: Secondary | ICD-10-CM | POA: Diagnosis present

## 2019-06-20 DIAGNOSIS — Z432 Encounter for attention to ileostomy: Secondary | ICD-10-CM | POA: Diagnosis not present

## 2019-06-20 DIAGNOSIS — R1084 Generalized abdominal pain: Secondary | ICD-10-CM

## 2019-06-20 DIAGNOSIS — Z20828 Contact with and (suspected) exposure to other viral communicable diseases: Secondary | ICD-10-CM | POA: Diagnosis present

## 2019-06-20 DIAGNOSIS — Z85048 Personal history of other malignant neoplasm of rectum, rectosigmoid junction, and anus: Secondary | ICD-10-CM | POA: Diagnosis not present

## 2019-06-20 DIAGNOSIS — R1110 Vomiting, unspecified: Secondary | ICD-10-CM | POA: Diagnosis not present

## 2019-06-20 DIAGNOSIS — Z90710 Acquired absence of both cervix and uterus: Secondary | ICD-10-CM | POA: Diagnosis not present

## 2019-06-20 DIAGNOSIS — Z8541 Personal history of malignant neoplasm of cervix uteri: Secondary | ICD-10-CM | POA: Diagnosis not present

## 2019-06-20 DIAGNOSIS — F17210 Nicotine dependence, cigarettes, uncomplicated: Secondary | ICD-10-CM | POA: Diagnosis present

## 2019-06-20 DIAGNOSIS — Z79899 Other long term (current) drug therapy: Secondary | ICD-10-CM | POA: Diagnosis not present

## 2019-06-20 DIAGNOSIS — Z9049 Acquired absence of other specified parts of digestive tract: Secondary | ICD-10-CM | POA: Diagnosis not present

## 2019-06-20 DIAGNOSIS — C20 Malignant neoplasm of rectum: Secondary | ICD-10-CM

## 2019-06-20 DIAGNOSIS — Z720 Tobacco use: Secondary | ICD-10-CM

## 2019-06-20 LAB — COMPREHENSIVE METABOLIC PANEL
ALT: 11 U/L (ref 0–44)
AST: 18 U/L (ref 15–41)
Albumin: 3.9 g/dL (ref 3.5–5.0)
Alkaline Phosphatase: 92 U/L (ref 38–126)
Anion gap: 11 (ref 5–15)
BUN: 10 mg/dL (ref 8–23)
CO2: 22 mmol/L (ref 22–32)
Calcium: 9.1 mg/dL (ref 8.9–10.3)
Chloride: 97 mmol/L — ABNORMAL LOW (ref 98–111)
Creatinine, Ser: 0.82 mg/dL (ref 0.44–1.00)
GFR calc Af Amer: >60 mL/min (ref >60)
GFR calc non Af Amer: >60 mL/min (ref >60)
Glucose, Bld: 107 mg/dL — ABNORMAL HIGH (ref 70–99)
Potassium: 3.8 mmol/L (ref 3.5–5.1)
Sodium: 130 mmol/L — ABNORMAL LOW (ref 135–145)
Total Bilirubin: 0.6 mg/dL (ref 0.3–1.2)
Total Protein: 6.7 g/dL (ref 6.5–8.1)

## 2019-06-20 LAB — CBC
HCT: 41.2 % (ref 36.0–46.0)
Hemoglobin: 13.7 g/dL (ref 12.0–15.0)
MCH: 31.7 pg (ref 26.0–34.0)
MCHC: 33.3 g/dL (ref 30.0–36.0)
MCV: 95.4 fL (ref 80.0–100.0)
Platelets: 330 10*3/uL (ref 150–400)
RBC: 4.32 MIL/uL (ref 3.87–5.11)
RDW: 12.4 % (ref 11.5–15.5)
WBC: 14.3 10*3/uL — ABNORMAL HIGH (ref 4.0–10.5)
nRBC: 0 % (ref 0.0–0.2)

## 2019-06-20 LAB — MAGNESIUM: Magnesium: 2 mg/dL (ref 1.7–2.4)

## 2019-06-20 LAB — PHOSPHORUS: Phosphorus: 3.9 mg/dL (ref 2.5–4.6)

## 2019-06-20 LAB — SARS CORONAVIRUS 2 BY RT PCR (HOSPITAL ORDER, PERFORMED IN ~~LOC~~ HOSPITAL LAB): SARS Coronavirus 2: NEGATIVE

## 2019-06-20 MED ORDER — PANTOPRAZOLE SODIUM 40 MG IV SOLR
40.0000 mg | Freq: Every day | INTRAVENOUS | Status: DC
  Administered 2019-06-20: 40 mg via INTRAVENOUS
  Filled 2019-06-20 (×3): qty 40

## 2019-06-20 MED ORDER — ACETAMINOPHEN 325 MG PO TABS
650.0000 mg | ORAL_TABLET | Freq: Four times a day (QID) | ORAL | Status: DC | PRN
  Administered 2019-06-21: 650 mg via ORAL
  Filled 2019-06-20: qty 2

## 2019-06-20 MED ORDER — ACETAMINOPHEN 650 MG RE SUPP: 650.0000 mg | Freq: Four times a day (QID) | RECTAL | Status: DC | PRN

## 2019-06-20 MED ORDER — IOHEXOL 300 MG/ML  SOLN
100.0000 mL | Freq: Once | INTRAMUSCULAR | Status: AC | PRN
  Administered 2019-06-20: 80 mL via INTRAVENOUS

## 2019-06-20 MED ORDER — DEXTROSE-NACL 5-0.9 % IV SOLN
INTRAVENOUS | Status: DC
  Administered 2019-06-20: 20:00:00 via INTRAVENOUS

## 2019-06-20 MED ORDER — ONDANSETRON HCL 4 MG/2ML IJ SOLN
4.0000 mg | Freq: Once | INTRAMUSCULAR | Status: AC
  Administered 2019-06-20: 4 mg via INTRAVENOUS
  Filled 2019-06-20: qty 2

## 2019-06-20 MED ORDER — HYDROMORPHONE HCL 1 MG/ML IJ SOLN: 0.5000 mg | Freq: Once | INTRAMUSCULAR | Status: DC

## 2019-06-20 MED ORDER — HYDROMORPHONE HCL 1 MG/ML IJ SOLN
0.5000 mg | INTRAMUSCULAR | Status: DC | PRN
  Administered 2019-06-20 – 2019-06-21 (×4): 0.5 mg via INTRAVENOUS
  Filled 2019-06-20 (×4): qty 0.5

## 2019-06-20 MED ORDER — HYDROMORPHONE HCL 1 MG/ML IJ SOLN: 0.7500 mg | INTRAMUSCULAR | Status: DC | PRN

## 2019-06-20 MED ORDER — PROCHLORPERAZINE EDISYLATE 10 MG/2ML IJ SOLN
5.0000 mg | INTRAMUSCULAR | Status: DC | PRN
  Administered 2019-06-20 (×2): 5 mg via INTRAVENOUS
  Filled 2019-06-20 (×2): qty 2

## 2019-06-20 MED ORDER — MORPHINE SULFATE (PF) 2 MG/ML IV SOLN
2.0000 mg | Freq: Once | INTRAVENOUS | Status: AC
  Administered 2019-06-20: 2 mg via INTRAVENOUS
  Filled 2019-06-20: qty 1

## 2019-06-20 MED ORDER — SODIUM CHLORIDE 0.9 % IV SOLN
INTRAVENOUS | Status: DC
  Administered 2019-06-20 (×2): via INTRAVENOUS

## 2019-06-20 MED ORDER — DIATRIZOATE MEGLUMINE & SODIUM 66-10 % PO SOLN
90.0000 mL | Freq: Once | ORAL | Status: DC
  Filled 2019-06-20: qty 90

## 2019-06-20 NOTE — ED Provider Notes (Addendum)
[facility] DEPT Provider Note   CSN: [id number] Arrival date & time: 06/19/19  1937    History   Chief Complaint Chief Complaint  Patient presents with  . Abdominal Pain  . Back Pain    HPI Deborah Arroyo is a 68 y.o. female with history of rectal cancer, neoplasm of the transverse colon, cervical cancer, osteopenia, tobacco abuse presents for evaluation of acute onset, progressively worsening abdominal pain since around 2 PM this afternoon.  The pain is constant stabbing "like a knife", and radiates to the back.  It is generalized, worsens with certain movements and she feels as though she cannot take a deep breath due to the pain in the abdomen.  Otherwise denies shortness of breath, chest pain, fever, cough.  She has had one episode of nonbloody nonbilious emesis and persistent nausea today.  Her last bowel movement was this morning and was loose which is not unusual for her as she is status post resection of the bowel, loop ileostomy with takedown, and chemo and radiation due to rectal cancer.  She had an ileostomy reversal in June.  Reports decreased flatus.  Denies urinary symptoms, known sick contacts, or suspicious food intake.  She tried tramadol and Pepto-Bismol without relief of her symptoms.     The history is provided by the patient.    Past Medical History:  Diagnosis Date  . Benign neoplasm of descending colon   . Benign neoplasm of transverse colon   . Cancer ([place]) 03/01/2018   rectal cancer=had chemo pills and radation   . Cataract   . Cervix cancer ([place]) 1979  . Depression   . Dyspnea   . 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 Hypoglycemia   . Hyponatremia    syncope with this and admitted twice due to this issue   . Osteopenia   . Rectal bleeding   . Rectal cancer s/p LAR/diverting loop ileostomy 07/12/2018 03/06/2018  . Smoker     Patient Active Problem List   Diagnosis Date Noted  . Pressure injury of skin 04/22/2019  .  Fecal incontinence with diarrhea 04/22/2019  . S/P closure of ileostomy 04/20/2019  . Acute right-sided low back pain without sciatica 08/10/2018  . 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 Supplemental oxygen dependent 07/21/2018  . Hypokalemia 07/21/2018  . Hypomagnesemia 07/21/2018  . Hypoxemia 07/13/2018  . Great toe pain, right 04/02/2018  . Rectal cancer s/p LAR/diverting loop ileostomy 07/12/2018 03/06/2018  . Osteoporosis 12/15/2017  . Cervicogenic headache 09/08/2017  . Rectal mass 08/25/2017  . Bilateral knee pain 08/25/2017  . Health care maintenance 09/04/2015  . Tobacco abuse 09/04/2015    Past Surgical History:  Procedure Laterality Date  . ABDOMINAL HYSTERECTOMY    . APPENDECTOMY    . BIOPSY  07/04/2018   Procedure: BIOPSY;  Surgeon: Jerene Bears, MD;  Location: [facility];  Service: Gastroenterology;;  . CHOLECYSTECTOMY    . COLONOSCOPY  03/01/2018   Dr.Pyrtle  . COLONOSCOPY WITH PROPOFOL N/A 07/04/2018   Procedure: COLONOSCOPY WITH PROPOFOL;  Surgeon: Jerene Bears, MD;  Location: [facility];  Service: Gastroenterology;  Laterality: N/A;  . CYSTOSCOPY WITH STENT PLACEMENT Bilateral 07/12/2018   Procedure: CYSTOSCOPY WITH STENT PLACEMENT;  Surgeon: Lucas Mallow, MD;  Location: [facility];  Service: Urology;  Laterality: Bilateral;  . DIVERTING ILEOSTOMY N/A 07/12/2018   Procedure: DIVERTING LOOP ILEOSTOMY;  Surgeon: Ileana Roup, MD;  Location: [facility];  Service: General;  Laterality:  N/A;  . EYE SURGERY    . FLEXIBLE SIGMOIDOSCOPY N/A 07/12/2018   Procedure: FLEXIBLE SIGMOIDOSCOPY;  Surgeon: Ileana Roup, MD;  Location: [facility];  Service: General;  Laterality: N/A;  . FLEXIBLE SIGMOIDOSCOPY N/A 12/04/2018   Procedure: FLEXIBLE SIGMOIDOSCOPY;  Surgeon: Ileana Roup, MD;  Location: [facility];  Service: General;  Laterality: N/A;  . ILEO LOOP COLOSTOMY CLOSURE N/A 04/20/2019   Procedure: OPEN TAKEDOWN OF ILEOSTOMY, primary repair of parastomal  hernia;  Surgeon: Ileana Roup, MD;  Location: [facility];  Service: General;  Laterality: N/A;  . LAPAROSCOPIC LOW ANTERIOR RESECTION N/A 07/12/2018   Procedure: LAPAROSCOPIC LOW ANTERIOR RESECTION WITH COLOPROSTOSTOMY, ERAS PATHWAY;  Surgeon: Ileana Roup, MD;  Location: [facility];  Service: General;  Laterality: N/A;  . POLYPECTOMY  07/04/2018   Procedure: POLYPECTOMY;  Surgeon: Jerene Bears, MD;  Location: [facility];  Service: Gastroenterology;;  . SHOULDER SURGERY Left    Rotary Cuff Tear  . TUBAL LIGATION       OB History    Gravida  4   Para  3   Term  3   Preterm      AB  1   Living  3     SAB      TAB  1   Ectopic      Multiple      Live Births               Home Medications    Prior to Admission medications   Medication Sig Start Date End Date Taking? Authorizing Provider  acetaminophen (TYLENOL) 500 MG tablet Take 500 mg by mouth every 6 (six) hours as needed for moderate pain.   Yes [provider]  alendronate (FOSAMAX) 70 MG tablet Take 1 tablet (70 mg total) by mouth every 7 (seven) days. Take with a full glass of water on an empty stomach. 06/11/19 06/10/20 Yes [place], Carley D, DO  calcium carbonate (CALTRATE 600) 1500 (600 Ca) MG TABS tablet Take 1 tablet (1,500 mg total) by mouth 2 (two) times daily with a meal. 06/11/19 07/11/19 Yes [place], Carley D, DO  traMADol (ULTRAM) 50 MG tablet Take 50 mg by mouth every 6 (six) hours as needed for moderate pain or severe pain.   Yes [provider]  cyclobenzaprine (FLEXERIL) 10 MG tablet TAKE 1 TABLET BY MOUTH THREE TIMES DAILYAS NEEDED FOR MUSCLE SPASMS Patient not taking: No sig reported 09/20/18   [place], Carley D, DO  psyllium (HYDROCIL/METAMUCIL) 95 % PACK Take 1 packet by mouth daily. Patient not taking: Reported on 06/19/2019 04/24/19   Ileana Roup, MD    Family History Family History  Problem Relation Age of Onset  . Stomach cancer Sister   .  Ovarian cancer Sister   . Cancer - Ovarian Sister   . Dementia Mother   . Uterine cancer Mother   . Endometrial cancer Mother   . Prostate cancer Brother   . Cancer - Prostate Brother   . Cancer Other   . Cancer Brother        lung/brain  . Breast cancer Daughter   . Alzheimer's disease Father   . Colon cancer Neg Hx   . Esophageal cancer Neg Hx   . Pancreatic cancer Neg Hx   . Rectal cancer Neg Hx   . Colon polyps Neg Hx     Social History Social History   Tobacco Use  . Smoking status: Current Every Day Smoker  Packs/day: 1.00    Types: Cigarettes  . Smokeless tobacco: Never Used  . Tobacco comment: Cutting back   Substance Use Topics  . Alcohol use: No    Alcohol/week: 0.0 standard drinks  . Drug use: No     Allergies   Aleve [naproxen sodium]   Review of Systems Review of Systems  Constitutional: Negative for chills and fever.  Respiratory: Negative for cough and shortness of breath.   Cardiovascular: Negative for chest pain.  Gastrointestinal: Positive for abdominal pain, diarrhea, nausea and vomiting.  Genitourinary: Negative for dysuria, frequency, hematuria and urgency.  All other systems reviewed and are negative.    Physical Exam Updated Vital Signs BP 129/75   Pulse 86   Temp 97.7 F (36.5 C) (Oral)   Resp 17   SpO2 98%   Physical Exam Vitals signs and nursing note reviewed.  Constitutional:      General: She is not in acute distress.    Appearance: She is well-developed.  HENT:     Head: Normocephalic and atraumatic.  Eyes:     General:        Right eye: No discharge.        Left eye: No discharge.     Conjunctiva/sclera: Conjunctivae normal.  Neck:     Vascular: No JVD.     Trachea: No tracheal deviation.  Cardiovascular:     Rate and Rhythm: Normal rate and regular rhythm.  Pulmonary:     Effort: Pulmonary effort is normal.     Breath sounds: Normal breath sounds.  Abdominal:     General: Abdomen is protuberant. A  surgical scar is present. Bowel sounds are decreased. There is no distension.     Palpations: Abdomen is soft.     Tenderness: There is generalized abdominal tenderness. There is no right CVA tenderness, left CVA tenderness, guarding or rebound. Negative signs include Murphy's sign.  Musculoskeletal:     Comments: No midline spine TTP, no paraspinal muscle tenderness, no deformity, crepitus, or step-off noted   Skin:    General: Skin is warm and dry.     Findings: No erythema.  Neurological:     Mental Status: She is alert.  Psychiatric:        Behavior: Behavior normal.      ED Treatments / Results  Labs (all labs ordered are listed, but only abnormal results are displayed) Labs Reviewed  COMPREHENSIVE METABOLIC PANEL - Abnormal; Notable for the following components:      Result Value   Sodium 130 (*)    Chloride 94 (*)    Glucose, Bld 119 (*)    All other components within normal limits  CBC - Abnormal; Notable for the following components:   WBC 12.3 (*)    All other components within normal limits  URINALYSIS, ROUTINE W REFLEX MICROSCOPIC - Abnormal; Notable for the following components:   Hgb urine dipstick MODERATE (*)    Ketones, ur 20 (*)    All other components within normal limits  SARS CORONAVIRUS 2 (HOSPITAL ORDER, [place] [facility])  LIPASE, BLOOD    EKG EKG Interpretation  Date/Time:  Tuesday June 19 2019 19:49:58 EDT Ventricular Rate:  66 PR Interval:    QRS Duration: 94 QT Interval:  432 QTC Calculation: 453 R Axis:   56 Text Interpretation:  Sinus rhythm Probable left atrial enlargement Borderline low voltage, extremity leads RSR' in V1 or V2, probably normal variant No significant change since last tracing Confirmed  by Dorie Rank [phone]) on 06/19/2019 8:18:59 PM   Radiology Dg Chest Portable 1 View  Result Date: 06/20/2019 CLINICAL DATA:  Difficulty with deep inspiration EXAM: PORTABLE CHEST 1 VIEW COMPARISON:  CT Mar 03, 2018, radiograph July 27 2018 FINDINGS: Diffuse hazy interstitial opacities with septal thickening and indistinct pulmonary vascularity. The heart appears enlarged when compared to prior portable radiographs. Remaining cardiomediastinal contours are unremarkable. No acute osseous or soft tissue abnormality. Mask wire at the base of the neck. IMPRESSION: Cardiomegaly with interstitial pulmonary edema. Electronically Signed   By: Lovena Le M.D.   On: 06/20/2019 01:04    Procedures .Critical Care Performed by: Deborah Papa, PA-C Authorized by: Deborah Papa, PA-C   Critical care provider statement:    Critical care time (minutes):  40   Critical care was necessary to treat or prevent imminent or life-threatening deterioration of the following conditions:  Respiratory failure   Critical care was time spent personally by me on the following activities:  Discussions with consultants, evaluation of patient's response to treatment, examination of patient, ordering and performing treatments and interventions, ordering and review of laboratory studies, ordering and review of radiographic studies, pulse oximetry, re-evaluation of patient's condition, obtaining history from patient or surrogate and review of old charts   (including critical care time)  Medications Ordered in ED Medications  morphine 4 MG/ML injection 4 mg (4 mg Intravenous Given 06/19/19 2210)  ondansetron (ZOFRAN) injection 4 mg (4 mg Intravenous Given 06/19/19 2210)  sodium chloride 0.9 % bolus 1,000 mL (0 mLs Intravenous Stopped 06/19/19 2333)  iohexol (OMNIPAQUE) 300 MG/ML solution 30 mL (30 mLs Oral Contrast Given 06/19/19 2229)  ondansetron (ZOFRAN) injection 4 mg (4 mg Intravenous Given 06/20/19 0051)  iohexol (OMNIPAQUE) 300 MG/ML solution 100 mL (80 mLs Intravenous Contrast Given 06/20/19 0058)     Initial Impression / Assessment and Plan / ED Course  I have reviewed the triage vital signs and the nursing notes.   Pertinent labs & imaging results that were available during my care of the patient were reviewed by me and considered in my medical decision making (see chart for details).       PEARLEE ARVIZU was evaluated in Emergency Department on 06/20/2019 for the symptoms described in the history of present illness. She was evaluated in the context of the global COVID-19 pandemic, which necessitated consideration that the patient might be at risk for infection with the SARS-CoV-2 virus that causes COVID-19. Institutional protocols and algorithms that pertain to the evaluation of patients at risk for COVID-19 are in a state of rapid change based on information released by regulatory bodies including the CDC and federal and state organizations. These policies and algorithms were followed during the patient's care in the ED.  Patient presenting for evaluation of generalized abdominal pain radiating to the back.  She is afebrile, vital signs are stable.  She is nontoxic in appearance.  No peritoneal signs on examination of the abdomen.  She is a complicated history of rectal cancer with multiple abdominal surgeries and reversal of ileostomy in the last year.  Will obtain blood work, give pain medicine and nausea medication and fluids and reassess.  Lab work thus far reviewed and shows leukocytosis, no anemia, no metabolic derangements, no renal insufficiency.  Her UA suggest some dehydration with mild ketonuria but no evidence of UTI or nephrolithiasis.  12:11AM While in the ED, patient developed hypoxia with SPO2 saturations 88% on room air.  She had  improvement on 3 L and was able to be de-escalated to 2 L via nasal cannula with stable SPO2 saturations.  She denies any shortness of breath but did mention that she had some difficulty taking deep breaths due to her abdominal pain.  Will add COVID swab and portable chest x-ray and reassess.  1:27 AM Chest xray shows diffuse hazy interstitial opacities with septal  thickening and indistinct pulmonary vascularity as well as some mild cardiomegaly compared to prior study. COVID swab pending. Signed out to oncoming provider PA [place]. May require general surgery consultation.  Will likely require admission.  Final Clinical Impressions(s) / ED Diagnoses   Final diagnoses:  Generalized abdominal pain  Hypoxia    ED Discharge Orders    None          Deborah Papa, PA-C 06/20/19 0151    Valarie Merino, MD 07/02/19 [phone]

## 2019-06-20 NOTE — ED Provider Notes (Signed)
Care handoff received from [facility] PA-C at shift change please see her note for further details.  In short 68 year old female history of rectal cancer with neoplasm to the transverse colon, cervical cancer, tobacco use, osteopenia presents today for abdominal pain that began 2 PM today.  One episode of nonbloody/nonbilious emesis.  Loose stool today.  Patient had ileostomy takedown June 19 of this year.  CBC with leukocytosis of 12.3 Lipase within normal limits CMP nonacute, sodium 130, has been repleted with normal saline Urinalysis noninfectious Chest x-ray:  IMPRESSION:  Cardiomegaly with interstitial pulmonary edema.  COVID-19 negative  While in the emergency department patient noted to be hypoxic with 88% SPO2 on room air, she improved on 3 L nasal cannula.  Denies any shortness of breath or chest pain.  Thought to be secondary to pain medication versus fluid overload as noted pulmonary edema on chest x-ray. - Plan of care at shift change is to await CT abdomen/pelvis results, likely surgical consultation and admission. Physical Exam  BP 127/74 (BP Location: Right Arm)   Pulse 64   Temp 97.7 F (36.5 C) (Oral)   Resp 11   SpO2 91%   Physical Exam Constitutional:      General: She is not in acute distress.    Appearance: Normal appearance. She is well-developed. She is not ill-appearing or diaphoretic.  HENT:     Head: Normocephalic and atraumatic.     Right Ear: External ear normal.     Left Ear: External ear normal.     Nose: Nose normal.  Eyes:     General: Vision grossly intact. Gaze aligned appropriately.     Pupils: Pupils are equal, round, and reactive to light.  Neck:     Musculoskeletal: Normal range of motion.     Trachea: Trachea and phonation normal. No tracheal deviation.  Cardiovascular:     Rate and Rhythm: Normal rate and regular rhythm.  Pulmonary:     Effort: Pulmonary effort is normal. No respiratory distress.  Abdominal:     General: There is no  distension.     Palpations: Abdomen is soft.     Tenderness: There is generalized abdominal tenderness. There is no guarding or rebound.  Musculoskeletal: Normal range of motion.  Skin:    General: Skin is warm and dry.  Neurological:     Mental Status: She is alert.     GCS: GCS eye subscore is 4. GCS verbal subscore is 5. GCS motor subscore is 6.     Comments: Speech is clear and goal oriented, follows commands Major Cranial nerves without deficit, no facial droop Moves extremities without ataxia, coordination intact  Psychiatric:        Behavior: Behavior normal.    ED Course/Procedures   Clinical Course as of Jun 19 253  Wed Jun 20, 2019  0218 Dr Olevia Bowens   [BM]    Clinical Course User Index [BM] Deliah Boston, PA-C    Procedures  MDM  CTAP:  IMPRESSION:  1. Postsurgical changes from recent ileostomy take-down with several  questionably thickened loops of distal small bowel and colon along a  suture line seen in the right lower quadrant near the operative  site. More proximal bowel appears air filled with delayed transit of  enteric contrast. Could suggest either ileus or less likely partial  obstruction.  2. There are additional postsurgical changes in the low pelvis at  the distal colonic anastomosis with a new low-attenuation presacral  fluid collection. No  convincing rim enhancement or extraluminal gas  is evident. Differential could include reactive free fluid versus  more insidious processes such as an anastomotic leak or developing  abscess.  3. Aortic Atherosclerosis (ICD10-I70.0).  - Discussed case with on-call general surgeon Dr. Lucia Gaskins who advises admission to hospitalist service at this time, surgical team to see patient tomorrow morning. - Patient reevaluated she is resting comfortably, sleeping and in no acute distress.  Easily arousable to voice.  Reports she is feeling well at this time, still with some mild abdominal tenderness on examination.   She states understanding of care plan and need for admission and she is agreeable.  SPO2 96% on 2L nasal cannula. - Discussed case with hospitalist Dr. Olevia Bowens will be seeing patient for admission. - Patient has been admitted to hospitalist service for further evaluation management.  Note: Portions of this report may have been transcribed using voice recognition software. Every effort was made to ensure accuracy; however, inadvertent computerized transcription errors may still be present.   Deliah Boston, PA-C 06/20/19 0302    Fatima Blank, MD 06/22/19 [phone]

## 2019-06-20 NOTE — Progress Notes (Signed)
Patient ID: Deborah Arroyo, female   DOB: 1951/09/08, 68 y.o.   MRN: [id number]       Subjective: Patient is well-known to Dr. Dema Severin as the patient underwent a LAR with diverting loop ileostomy on 07/12/2018 for rectal cancer.  She then underwent a takedown of her ileostomy with primary closure of a parastomal hernia also by Dr. Dema Severin on 04/20/2019.  She has been doing well since this time until yesterday she started having RLQ abdominal pain.  History is difficult to obtain right now as she keeps falling asleep from pain medication.  She has been having nausea and vomiting since yesterday as well.  She thinks he last BM was yesterday as well.  She states she never has a great appetite so a decrease would not be out of the ordinary.    She presented to the ED yesterday and had a CT scan that revealed a dilated stomach with dilated small bowel consistent with either an ileus or pSBO.  She has been admitted for further evaluation and management.  Her WBC did increase from 12 to 14K today.  We have been asked to see her for further recommendations.  Objective: Vital signs in last 24 hours: Temp:  [97.7 F (36.5 C)-98.4 F (36.9 C)] 98.4 F (36.9 C) (08/19 0405) Pulse Rate:  [64-87] 85 (08/19 0405) Resp:  [10-20] 18 (08/19 0405) BP: (121-152)/(69-98) 127/80 (08/19 0405) SpO2:  [90 %-99 %] 98 % (08/19 0405) Last BM Date: 06/19/19  Intake/Output from previous day: 08/18 0701 - 08/19 0700 In: 1000 [IV Piggyback:1000] Out: -  Intake/Output this shift: Total I/O In: -  Out: 100 [Urine:100]  PE: Heart: regular Lungs: CTAB Abd: soft, but more tense in the RLQ, and tender in RLQ with some voluntary guarding.  Overall a bit distended.  Lab Results:  Recent Labs    06/19/19 2009 06/20/19 0534  WBC 12.3* 14.3*  HGB 12.5 13.7  HCT 38.1 41.2  PLT 325 330   BMET Recent Labs    06/19/19 2009 06/20/19 0534  NA 130* 130*  K 4.2 3.8  CL 94* 97*  CO2 24 22  GLUCOSE 119* 107*  BUN 13 10   CREATININE 0.89 0.82  CALCIUM 9.6 9.1   PT/INR No results for input(s): LABPROT, INR in the last 72 hours. CMP     Component Value Date/Time   NA 130 (L) 06/20/2019 0534   NA 135 08/24/2017 1531   K 3.8 06/20/2019 0534   CL 97 (L) 06/20/2019 0534   CO2 22 06/20/2019 0534   GLUCOSE 107 (H) 06/20/2019 0534   GLUCOSE 95 03/28/2014 1224   BUN 10 06/20/2019 0534   BUN 9 08/24/2017 1531   CREATININE 0.82 06/20/2019 0534   CREATININE 1.14 (H) 11/16/2018 1351   CALCIUM 9.1 06/20/2019 0534   PROT 6.7 06/20/2019 0534   PROT 6.9 08/24/2017 1531   ALBUMIN 3.9 06/20/2019 0534   ALBUMIN 4.4 08/24/2017 1531   AST 18 06/20/2019 0534   AST 22 11/16/2018 1351   ALT 11 06/20/2019 0534   ALT 14 11/16/2018 1351   ALKPHOS 92 06/20/2019 0534   BILITOT 0.6 06/20/2019 0534   BILITOT 0.5 11/16/2018 1351   GFRNONAA >60 06/20/2019 0534   GFRNONAA 49 (L) 11/16/2018 1351   GFRAA >60 06/20/2019 0534   GFRAA 57 (L) 11/16/2018 1351   Lipase     Component Value Date/Time   LIPASE 29 06/19/2019 2009       Studies/Results: Ct Abdomen Pelvis  W Contrast  Result Date: 06/20/2019 CLINICAL DATA:  Abdominal pain, nausea, vomiting EXAM: CT ABDOMEN AND PELVIS WITH CONTRAST TECHNIQUE: Multidetector CT imaging of the abdomen and pelvis was performed using the standard protocol following bolus administration of intravenous contrast. CONTRAST:  2mL OMNIPAQUE IOHEXOL 300 MG/ML  SOLN COMPARISON:  CT abdomen pelvis 01/04/2019 FINDINGS: Lower chest: Interlobular septal thickening is present in the bases with dependent ground-glass opacity posteriorly likely reflecting atelectasis. Atherosclerotic calcification of the coronary arteries. Hepatobiliary: No focal liver abnormality is seen. Patient is post cholecystectomy. Slight prominence of the biliary tree likely related to reservoir effect. No calcified intraductal gallstones. Pancreas: Unremarkable. No pancreatic ductal dilatation or surrounding inflammatory changes.  Spleen: Normal in size without focal abnormality. Adrenals/Urinary Tract: Normal adrenal glands. Scattered subcentimeter hypoattenuating foci in the kidneys, too small to characterize though statistically likely benign. Stomach/Bowel: Marked distension of the stomach with ingested contrast medium. Minimal passage of contrast into the small bowel loops. Multiple air-filled loops are present in the mid abdomen but without frank dilatation. There are several loops of mildly thickened distal ileum in the right lower quadrant along the suture line at the site of ileostomy take down. Mild thickening of the cecum and ascending colon is present as well with fluid in the right paracolic gutter. Postsurgical changes from prior low anterior resection noted in the pelvis. New low-attenuation fluid collection is seen presacral space intimate with the distal colonic anastomosis. Vascular/Lymphatic: Atherosclerotic plaque within the normal caliber aorta. No suspicious or enlarged lymph nodes in the included lymphatic chains. Reproductive: Uterus is surgically absent. No concerning adnexal lesions. Other: New presacral collection detailed in stomach/bowel above. Additional fluid tracking in the right paracolic gutter. No free intraperitoneal air postsurgical changes of the right lower quadrant at the site of ileostomy takedown. Mild body wall edema. Musculoskeletal: No acute osseous abnormality or suspicious osseous lesion. Insert Degen spine findings maximal at the L5-S1 level with sclerotic endplate changes. No suspicious osseous lesions. IMPRESSION: 1. Postsurgical changes from recent ileostomy take-down with several questionably thickened loops of distal small bowel and colon along a suture line seen in the right lower quadrant near the operative site. More proximal bowel appears air filled with delayed transit of enteric contrast. Could suggest either ileus or less likely partial obstruction. 2. There are additional  postsurgical changes in the low pelvis at the distal colonic anastomosis with a new low-attenuation presacral fluid collection. No convincing rim enhancement or extraluminal gas is evident. Differential could include reactive free fluid versus more insidious processes such as an anastomotic leak or developing abscess. 3. Aortic Atherosclerosis (ICD10-I70.0). These results were called by telephone at the time of interpretation on 06/20/2019 at 1:56 am to Dr. Rodell Perna , who verbally acknowledged these results. Electronically Signed   By: Lovena Le M.D.   On: 06/20/2019 01:56   Dg Chest Portable 1 View  Result Date: 06/20/2019 CLINICAL DATA:  Difficulty with deep inspiration EXAM: PORTABLE CHEST 1 VIEW COMPARISON:  CT Mar 03, 2018, radiograph July 27 2018 FINDINGS: Diffuse hazy interstitial opacities with septal thickening and indistinct pulmonary vascularity. The heart appears enlarged when compared to prior portable radiographs. Remaining cardiomediastinal contours are unremarkable. No acute osseous or soft tissue abnormality. Mask wire at the base of the neck. IMPRESSION: Cardiomegaly with interstitial pulmonary edema. Electronically Signed   By: Lovena Le M.D.   On: 06/20/2019 01:04    Anti-infectives: Anti-infectives (From admission, onward)   None       Assessment/Plan Rectal cancer,  s/p LAR with diverting ileostomy and subsequent takedown on 04/20/19 by Dr. Dema Severin  Abdominal pain, psbo The patient is having significant gastric distention as well as small distention.  It is difficult to tell if she has an ileus vs a possible psbo.  Given her gastric distention and N/V, we will place and NGT.  She is over 6 weeks out from surgery.  We will proceed with the SBO protocol.  I am a little concerned about her abdominal pain, but hopefully this will improve with insertion of NGT and decompression.  Will continue to follow her closely   FEN - NPO/NGT/IVFs VTE - ok for chemical prophylaxis  from our standpoint ID - none currently needed   LOS: 0 days    Henreitta Cea , [facility] 06/20/2019, 10:19 AM Pager: [phone]

## 2019-06-20 NOTE — ED Notes (Addendum)
Report given to [facility], [place].

## 2019-06-20 NOTE — Progress Notes (Signed)
2 RN attempt to insert NGT has ordered, pt refused x 2, stating "that's not going in my nose, I cant do that"  Saverio Danker, PA made aware

## 2019-06-20 NOTE — H&P (Addendum)
History and Physical    Deborah Arroyo:[id number] DOB: 04-03-1951 DOA: 06/19/2019  PCP: Modena Nunnery D, DO    Patient coming from: Home.  I have personally briefly reviewed patient's old medical records in [place]  Chief Complaint: Abdominal pain.  HPI: Deborah Arroyo is a 68 y.o. female with medical history significant of benign neoplasm of colon, rectal cancer, diverting ileostomy, high output ileostomy, hypoglycemia cataracts, cervix cancer, depression, dyspnea, hyponatremia, osteopenia, cigarette smoker who is coming to the emergency department due to abdominal pain associated with 3 episodes of emesis since yesterday.  She denies constipation and states that her last BM was yesterday morning.  She had 2 episodes of emesis before arriving to the ER and then vomited once in the emergency department yesterday evening.  She denies fever, chills, sore throat, rhinorrhea, dyspnea, hemoptysis, chest pain, palpitations, dizziness, diaphoresis, PND, orthopnea or pitting edema of the lower extremities.  No dysuria, frequency or hematuria.  Denies polyuria, polydipsia, polyphagia or blurred vision.  ED Course: Initial vital signs temperature 97.7 F, pulse 64, respirations 10, blood pressure 135/78 mmHg and O2 sat 96% on room air.  Patient received a 1000 mL NS bolus, ondansetron 4 mg IVP x1, morphine sulfate 4 mg IVP x2.  General surgery (Dr. Lucia Gaskins) was contacted and will evaluate the patient in a.m.  Her urinalysis showed moderate hemoglobinuria and 20 mg/dL of ketones.  The rest of the UA is unremarkable.  CBC showed a white count of 12.4, hemoglobin 12.5 g/dL and platelets 325.  CMP shows a sodium of 139 chloride 94 mmol/L.  Glucose 119 mg/dL.  All other chemistry values, including lipase, were within normal limits.  SARS coronavirus 2 nasopharyngeal swab was negative.  Imaging: her 1 view chest radiograph showed cardiomegaly with interstitial pulmonary edema.  CT abdomen/pelvis  shows postsurgical changes from recent ileostomy take-down with several questionable thickened loops of distal small bowel and colon along suture line seen in the right lower quadrant near appear at this site.  There are additional postsurgical changes in the low pelvis of the distal colonic anastomosis.  Please see images and full radiology report for further detail.  Review of Systems: As per HPI otherwise 10 point review of systems negative.   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   Hypoglycemia    Hyponatremia    syncope with this and admitted twice due to this issue    Osteopenia    Rectal bleeding    Rectal cancer s/p LAR/diverting loop ileostomy 07/12/2018 03/06/2018   Smoker     Past Surgical History:  Procedure Laterality Date   ABDOMINAL HYSTERECTOMY     APPENDECTOMY     BIOPSY  07/04/2018   Procedure: BIOPSY;  Surgeon: Jerene Bears, MD;  Location: [facility];  Service: Gastroenterology;;   CHOLECYSTECTOMY     COLONOSCOPY  03/01/2018   Dr.Pyrtle   COLONOSCOPY WITH PROPOFOL N/A 07/04/2018   Procedure: COLONOSCOPY WITH PROPOFOL;  Surgeon: Jerene Bears, MD;  Location: [facility];  Service: Gastroenterology;  Laterality: N/A;   CYSTOSCOPY WITH STENT PLACEMENT Bilateral 07/12/2018   Procedure: CYSTOSCOPY WITH STENT PLACEMENT;  Surgeon: Lucas Mallow, MD;  Location: [facility];  Service: Urology;  Laterality: Bilateral;   DIVERTING ILEOSTOMY N/A 07/12/2018   Procedure: DIVERTING LOOP ILEOSTOMY;  Surgeon: Ileana Roup, MD;  Location: [facility];  Service: General;  Laterality: N/A;   EYE SURGERY     FLEXIBLE SIGMOIDOSCOPY N/A 07/12/2018   Procedure: FLEXIBLE SIGMOIDOSCOPY;  Surgeon: Ileana Roup, MD;  Location: [facility];  Service: General;  Laterality: N/A;   FLEXIBLE SIGMOIDOSCOPY N/A 12/04/2018   Procedure: FLEXIBLE SIGMOIDOSCOPY;  Surgeon: Ileana Roup, MD;  Location: [facility];  Service: General;  Laterality: N/A;   ILEO LOOP COLOSTOMY CLOSURE N/A 04/20/2019   Procedure: OPEN TAKEDOWN OF ILEOSTOMY, primary repair of parastomal hernia;  Surgeon: Ileana Roup, MD;  Location: [facility];  Service: General;  Laterality: N/A;   LAPAROSCOPIC LOW ANTERIOR RESECTION N/A 07/12/2018   Procedure: LAPAROSCOPIC LOW ANTERIOR RESECTION WITH COLOPROSTOSTOMY, ERAS PATHWAY;  Surgeon: Ileana Roup, MD;  Location: [facility];  Service: General;  Laterality: N/A;   POLYPECTOMY  07/04/2018   Procedure: POLYPECTOMY;  Surgeon: Jerene Bears, MD;  Location: [facility];  Service: Gastroenterology;;   SHOULDER SURGERY Left    Rotary Cuff Tear   TUBAL LIGATION       reports that she has been smoking cigarettes. She has been smoking about 1.00 pack per day. She has never used smokeless tobacco. She reports that she does not drink alcohol or use drugs.  Allergies  Allergen Reactions   Aleve [Naproxen Sodium] Swelling and Other (See Comments)    Eyes and throat swell up    Family History  Problem Relation Age of Onset   Stomach cancer Sister    Ovarian cancer Sister    Cancer - Ovarian Sister    Dementia Mother    Uterine cancer Mother    Endometrial cancer Mother    Prostate cancer Brother    Cancer - Prostate Brother    Cancer Other    Cancer Brother        lung/brain   Breast cancer Daughter    Alzheimer's disease Father    Colon cancer Neg Hx    Esophageal cancer Neg Hx    Pancreatic cancer Neg Hx    Rectal cancer Neg Hx    Colon polyps Neg Hx    Prior to Admission medications   Medication Sig Start Date End Date Taking? Authorizing Provider  acetaminophen (TYLENOL) 500 MG tablet Take 500 mg by mouth every 6 (six) hours as needed for  moderate pain.   Yes [provider]  alendronate (FOSAMAX) 70 MG tablet Take 1 tablet (70 mg total) by mouth every 7 (seven) days. Take with a full glass of water on an empty stomach. 06/11/19 06/10/20 Yes [place]field, Carley D, DO  calcium carbonate (CALTRATE 600) 1500 (600 Ca) MG TABS tablet Take 1 tablet (1,500 mg total) by mouth 2 (two) times daily with a meal. 06/11/19 07/11/19 Yes [place], Carley D, DO  traMADol (ULTRAM) 50 MG tablet Take 50 mg by mouth every 6 (six) hours as needed for moderate pain or severe pain.   Yes [provider]  cyclobenzaprine (FLEXERIL) 10 MG tablet TAKE 1 TABLET BY MOUTH THREE TIMES DAILYAS NEEDED FOR MUSCLE SPASMS Patient not taking: No sig reported 09/20/18   [place], Carley D, DO  psyllium (HYDROCIL/METAMUCIL) 95 % PACK Take 1 packet by mouth daily. Patient not taking: Reported on 06/19/2019 04/24/19   Ileana Roup, MD    Physical Exam: Vitals:   06/20/19 0000  06/20/19 0030 06/20/19 0115 06/20/19 0200  BP: 139/89 (!) 142/92 129/75 127/74  Pulse: 67 87 86 64  Resp: 12 20 17 11   Temp:      TempSrc:      SpO2: 99% 97% 98% 91%    Constitutional: Looks acutely ill. Eyes: PERRL, lids and conjunctivae normal ENMT: Mucous membranes are dry. Posterior pharynx clear of any exudate or lesions.  Neck: normal, supple, no masses, no thyromegaly Respiratory: Decreased breath sounds in bases, otherwise clear to auscultation bilaterally, no wheezing, no crackles. Normal respiratory effort. No accessory muscle use.  Cardiovascular: Regular rate and rhythm, no murmurs / rubs / gallops. No extremity edema. 2+ pedal pulses. No carotid bruits.  Abdomen: Minimal distention.  Positive surgical scar.  Bowel sounds positive.  Soft, mild diffuse tenderness, no guarding or rebound, no masses palpated. No hepatosplenomegaly. Musculoskeletal: no clubbing / cyanosis.  Good ROM, no contractures. Normal muscle tone.  Skin: Scattered areas of ecchymosis on  upper extremities. Neurologic: CN 2-12 grossly intact. Sensation intact, DTR normal. Strength 5/5 in all 4.  Psychiatric: Normal judgment and insight. Alert and oriented x 3. Normal mood.   Labs on Admission: I have personally reviewed following labs and imaging studies  CBC: Recent Labs  Lab 06/19/19 2009  WBC 12.3*  HGB 12.5  HCT 38.1  MCV 93.8  PLT 528   Basic Metabolic Panel: Recent Labs  Lab 06/19/19 2009  NA 130*  K 4.2  CL 94*  CO2 24  GLUCOSE 119*  BUN 13  CREATININE 0.89  CALCIUM 9.6   GFR: Estimated Creatinine Clearance: 41.3 mL/min (by C-G formula based on SCr of 0.89 mg/dL). Liver Function Tests: Recent Labs  Lab 06/19/19 2009  AST 18  ALT 12  ALKPHOS 83  BILITOT 0.3  PROT 7.0  ALBUMIN 3.9   Recent Labs  Lab 06/19/19 2009  LIPASE 29   No results for input(s): AMMONIA in the last 168 hours. Coagulation Profile: No results for input(s): INR, PROTIME in the last 168 hours. Cardiac Enzymes: No results for input(s): CKTOTAL, CKMB, CKMBINDEX, TROPONINI in the last 168 hours. BNP (last 3 results) No results for input(s): PROBNP in the last 8760 hours. HbA1C: No results for input(s): HGBA1C in the last 72 hours. CBG: No results for input(s): GLUCAP in the last 168 hours. Lipid Profile: No results for input(s): CHOL, HDL, LDLCALC, TRIG, CHOLHDL, LDLDIRECT in the last 72 hours. Thyroid Function Tests: No results for input(s): TSH, T4TOTAL, FREET4, T3FREE, THYROIDAB in the last 72 hours. Anemia Panel: No results for input(s): VITAMINB12, FOLATE, FERRITIN, TIBC, IRON, RETICCTPCT in the last 72 hours. Urine analysis:    Component Value Date/Time   COLORURINE YELLOW 06/19/2019 1957   APPEARANCEUR CLEAR 06/19/2019 1957   LABSPEC 1.016 06/19/2019 1957   PHURINE 6.0 06/19/2019 1957   GLUCOSEU NEGATIVE 06/19/2019 1957   HGBUR MODERATE (A) 06/19/2019 1957   BILIRUBINUR NEGATIVE 06/19/2019 1957   KETONESUR 20 (A) 06/19/2019 1957   PROTEINUR NEGATIVE  06/19/2019 1957   UROBILINOGEN 0.2 01/09/2012 1354   NITRITE NEGATIVE 06/19/2019 1957   LEUKOCYTESUR NEGATIVE 06/19/2019 1957    Radiological Exams on Admission: Ct Abdomen Pelvis W Contrast  Result Date: 06/20/2019 CLINICAL DATA:  Abdominal pain, nausea, vomiting EXAM: CT ABDOMEN AND PELVIS WITH CONTRAST TECHNIQUE: Multidetector CT imaging of the abdomen and pelvis was performed using the standard protocol following bolus administration of intravenous contrast. CONTRAST:  54mL OMNIPAQUE IOHEXOL 300 MG/ML  SOLN COMPARISON:  CT abdomen pelvis 01/04/2019 FINDINGS:  Lower chest: Interlobular septal thickening is present in the bases with dependent ground-glass opacity posteriorly likely reflecting atelectasis. Atherosclerotic calcification of the coronary arteries. Hepatobiliary: No focal liver abnormality is seen. Patient is post cholecystectomy. Slight prominence of the biliary tree likely related to reservoir effect. No calcified intraductal gallstones. Pancreas: Unremarkable. No pancreatic ductal dilatation or surrounding inflammatory changes. Spleen: Normal in size without focal abnormality. Adrenals/Urinary Tract: Normal adrenal glands. Scattered subcentimeter hypoattenuating foci in the kidneys, too small to characterize though statistically likely benign. Stomach/Bowel: Marked distension of the stomach with ingested contrast medium. Minimal passage of contrast into the small bowel loops. Multiple air-filled loops are present in the mid abdomen but without frank dilatation. There are several loops of mildly thickened distal ileum in the right lower quadrant along the suture line at the site of ileostomy take down. Mild thickening of the cecum and ascending colon is present as well with fluid in the right paracolic gutter. Postsurgical changes from prior low anterior resection noted in the pelvis. New low-attenuation fluid collection is seen presacral space intimate with the distal colonic anastomosis.  Vascular/Lymphatic: Atherosclerotic plaque within the normal caliber aorta. No suspicious or enlarged lymph nodes in the included lymphatic chains. Reproductive: Uterus is surgically absent. No concerning adnexal lesions. Other: New presacral collection detailed in stomach/bowel above. Additional fluid tracking in the right paracolic gutter. No free intraperitoneal air postsurgical changes of the right lower quadrant at the site of ileostomy takedown. Mild body wall edema. Musculoskeletal: No acute osseous abnormality or suspicious osseous lesion. Insert Degen spine findings maximal at the L5-S1 level with sclerotic endplate changes. No suspicious osseous lesions. IMPRESSION: 1. Postsurgical changes from recent ileostomy take-down with several questionably thickened loops of distal small bowel and colon along a suture line seen in the right lower quadrant near the operative site. More proximal bowel appears air filled with delayed transit of enteric contrast. Could suggest either ileus or less likely partial obstruction. 2. There are additional postsurgical changes in the low pelvis at the distal colonic anastomosis with a new low-attenuation presacral fluid collection. No convincing rim enhancement or extraluminal gas is evident. Differential could include reactive free fluid versus more insidious processes such as an anastomotic leak or developing abscess. 3. Aortic Atherosclerosis (ICD10-I70.0). These results were called by telephone at the time of interpretation on 06/20/2019 at 1:56 am to Dr. Rodell Perna , who verbally acknowledged these results. Electronically Signed   By: Lovena Le M.D.   On: 06/20/2019 01:56   Dg Chest Portable 1 View  Result Date: 06/20/2019 CLINICAL DATA:  Difficulty with deep inspiration EXAM: PORTABLE CHEST 1 VIEW COMPARISON:  CT Mar 03, 2018, radiograph July 27 2018 FINDINGS: Diffuse hazy interstitial opacities with septal thickening and indistinct pulmonary vascularity. The  heart appears enlarged when compared to prior portable radiographs. Remaining cardiomediastinal contours are unremarkable. No acute osseous or soft tissue abnormality. Mask wire at the base of the neck. IMPRESSION: Cardiomegaly with interstitial pulmonary edema. Electronically Signed   By: Lovena Le M.D.   On: 06/20/2019 01:04    EKG: Independently reviewed.  Vent. rate 66 BPM PR interval * ms QRS duration 94 ms QT/QTc 432/453 ms P-R-T axes 78 56 51 Sinus rhythm Probable left atrial enlargement Borderline low voltage, extremity leads RSR' in V1 or V2, probably normal variant  Assessment/Plan Principal Problem:   Abdominal pain Observation/MedSurg. Keep n.p.o. Continue normal saline infusion. Hydromorphone 0.5 mg IVP every 3 hours PRN. Compazine 5 mg IVP every 4 hours as  needed. NGT placement if vomiting recurs. General surgery will evaluate later today.  Active Problems:   Tobacco abuse Declined nicotine replacement therapy. Staff to provide tobacco cessation information.    Hyponatremia Secondary to GI losses. Continue normal saline infusion. Follow-up sodium level.    Leukocytosis Likely stress-induced. Continue current treatment. Monitor WBC. Consider antibiotics if the patient develops a fever.   DVT prophylaxis: Lovenox.  Code Status: Full code. Family Communication:  Disposition Plan: Observation for IV hydration, bowel rest and symptoms treatment. Consults called: EDP is spoke to Dr. Lucia Gaskins who will evaluate in the morning. Admission status: Observation/MedSurg.   Reubin Milan MD [facility]  If 7PM-7AM, please contact night-coverage [email or web address]  06/20/2019, 2:31 AM   This document was prepared using Dragon voice recognition software and may contain some unintended transcription errors.

## 2019-06-20 NOTE — Progress Notes (Signed)
   Patient seen and evaluated, chart reviewed, please see EMR for updated orders. Please see full H&P dictated by admitting physician Dr Olevia Bowens for same date of service.   Brief summary  68 y.o. female with medical history significant for rectal cancer, s/p diverting ileostomy with subsequent DLI takedown 04/20/2019 , h/o cervix cancer, depression, dyspnea, hyponatremia, osteopenia, cigarette smoker--admitted on 06/20/2019 with persistent abdominal pain nausea and vomiting with clinical and imaging findings suggestive of partial small bowel obstruction   A/p 1) partial small bowel obstruction--patient with persistent emesis and abdominal pain , -NG tube for decompression recommended -Ileitis/enteritis less likely NPO--- due to persistent emesis and partial small bowel obstruction -Discussed with surgical team  2) history of rectal cancer--- status post prior neoadjuvant chemoXRT and subsequently underwent laparoscopic LAR with diverting loop ileostomy 07/12/18 with anastomosis 4.5 cm from anal verge with subsequent DLI takedown 04/20/2019 --- surgical input appreciated  3)FEN--while n.p.o.... Give IV dextrose solution to avoid hypoglycemia and dehydration  4) small pelvic fluid collection----query anastomotic leak, defer to surgical team   Patient seen and evaluated, chart reviewed, please see EMR for updated orders. Please see full H&P dictated by admitting physician Dr Olevia Bowens for same date of service.

## 2019-06-20 NOTE — ED Notes (Signed)
ED TO INPATIENT HANDOFF REPORT  ED Nurse Name and Phone #: [place] G, [phone]  S Name/Age/Gender Deborah Arroyo 68 y.o. female Room/Bed: WA19/WA19  Code Status   Code Status: Full Code  Home/SNF/Other Home Patient oriented to: self, place, time and situation Is this baseline? Yes   Triage Complete: Triage complete  Chief Complaint Abdominal Pain; Back Pain  Triage Note Patient was picked up by [facility] in her car. Patient started complaining of abdominal and back pain that's radiating to her back. 10/10. 4mg  of zofran and 100 mcg of Fentanyl. Pain is not 7/10. Hx of rectal cancer.    Allergies Allergies  Allergen Reactions  . Aleve [Naproxen Sodium] Swelling and Other (See Comments)    Eyes and throat swell up    Level of Care/Admitting Diagnosis ED Disposition    ED Disposition Condition Comment   Admit  Hospital Area: [place] [[id number]]  Level of Care: Med-Surg [16]  Covid Evaluation: Confirmed COVID Negative  Diagnosis: Abdominal pain [831517]  Admitting Physician: Reubin Milan [[id number]]  Attending Physician: Reubin Milan [[id number]]  PT Class (Do Not Modify): Observation [104]  PT Acc Code (Do Not Modify): Observation [10022]       B Medical/Surgery History Past Medical History:  Diagnosis Date  . Benign neoplasm of descending colon   . Benign neoplasm of transverse colon   . Cancer ([place]) 03/01/2018   rectal cancer=had chemo pills and radation   . Cataract   . Cervix cancer ([place]) 1979  . Depression   . Dyspnea   . 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 Hypoglycemia   . Hyponatremia    syncope with this and admitted twice due to this issue   . Osteopenia   . Rectal bleeding   . Rectal cancer s/p LAR/diverting loop ileostomy 07/12/2018 03/06/2018  . Smoker    Past Surgical History:  Procedure Laterality Date  . ABDOMINAL HYSTERECTOMY    . APPENDECTOMY    . BIOPSY  07/04/2018   Procedure: BIOPSY;  Surgeon: Jerene Bears, MD;  Location: [facility];  Service: Gastroenterology;;  . CHOLECYSTECTOMY    . COLONOSCOPY  03/01/2018   Dr.Pyrtle  . COLONOSCOPY WITH PROPOFOL N/A 07/04/2018   Procedure: COLONOSCOPY WITH PROPOFOL;  Surgeon: Jerene Bears, MD;  Location: [facility];  Service: Gastroenterology;  Laterality: N/A;  . CYSTOSCOPY WITH STENT PLACEMENT Bilateral 07/12/2018   Procedure: CYSTOSCOPY WITH STENT PLACEMENT;  Surgeon: Lucas Mallow, MD;  Location: [facility];  Service: Urology;  Laterality: Bilateral;  . DIVERTING ILEOSTOMY N/A 07/12/2018   Procedure: DIVERTING LOOP ILEOSTOMY;  Surgeon: Ileana Roup, MD;  Location: [facility];  Service: General;  Laterality: N/A;  . EYE SURGERY    . FLEXIBLE SIGMOIDOSCOPY N/A 07/12/2018   Procedure: FLEXIBLE SIGMOIDOSCOPY;  Surgeon: Ileana Roup, MD;  Location: [facility];  Service: General;  Laterality: N/A;  . FLEXIBLE SIGMOIDOSCOPY N/A 12/04/2018   Procedure: FLEXIBLE SIGMOIDOSCOPY;  Surgeon: Ileana Roup, MD;  Location: [facility];  Service: General;  Laterality: N/A;  . ILEO LOOP COLOSTOMY CLOSURE N/A 04/20/2019   Procedure: OPEN TAKEDOWN OF ILEOSTOMY, primary repair of parastomal hernia;  Surgeon: Ileana Roup, MD;  Location: [facility];  Service: General;  Laterality: N/A;  . LAPAROSCOPIC LOW ANTERIOR RESECTION N/A 07/12/2018   Procedure: LAPAROSCOPIC LOW ANTERIOR RESECTION WITH COLOPROSTOSTOMY, ERAS PATHWAY;  Surgeon: Ileana Roup, MD;  Location: [facility];  Service: General;  Laterality: N/A;  . POLYPECTOMY  07/04/2018   Procedure: POLYPECTOMY;  Surgeon: Jerene Bears, MD;  Location: [facility];  Service: Gastroenterology;;  . SHOULDER SURGERY Left    Rotary Cuff Tear  . TUBAL LIGATION       A IV Location/Drains/Wounds Patient Lines/Drains/Airways Status   Active Line/Drains/Airways    Name:   Placement date:   Placement time:   Site:   Days:   Peripheral IV 06/19/19 Left Antecubital   06/19/19    -    Antecubital   1    Incision (Closed) 07/12/18 Abdomen Other (Comment)   07/12/18    1256     343   Incision (Closed) 04/20/19 Abdomen Other (Comment)   04/20/19    0900     61   Incision - 3 Ports Abdomen Left;Upper Left;Lower Right;Mid   07/12/18    0945     343   Pressure Injury 04/20/19 Anus Stage II -  Partial thickness loss of dermis presenting as a shallow open ulcer with a red, pink wound bed without slough. Pt states she got it from radiation   04/20/19    1215     61          Intake/Output Last 24 hours  Intake/Output Summary (Last 24 hours) at 06/20/2019 2993 Last data filed at 06/19/2019 2333 Gross per 24 hour  Intake 1000 ml  Output -  Net 1000 ml    Labs/Imaging Results for orders placed or performed during the hospital encounter of 06/19/19 (from the past 48 hour(s))  Urinalysis, Routine w reflex microscopic     Status: Abnormal   Collection Time: 06/19/19  7:57 PM  Result Value Ref Range   Color, Urine YELLOW YELLOW   APPearance CLEAR CLEAR   Specific Gravity, Urine 1.016 1.005 - 1.030   pH 6.0 5.0 - 8.0   Glucose, UA NEGATIVE NEGATIVE mg/dL   Hgb urine dipstick MODERATE (A) NEGATIVE   Bilirubin Urine NEGATIVE NEGATIVE   Ketones, ur 20 (A) NEGATIVE mg/dL   Protein, ur NEGATIVE NEGATIVE mg/dL   Nitrite NEGATIVE NEGATIVE   Leukocytes,Ua NEGATIVE NEGATIVE   RBC / HPF 21-50 0 - 5 RBC/hpf   WBC, UA 0-5 0 - 5 WBC/hpf   Bacteria, UA NONE SEEN NONE SEEN   Squamous Epithelial / LPF 0-5 0 - 5   Mucus PRESENT    Hyaline Casts, UA PRESENT     Comment: Performed at [facility], [place] [address], [place], [place] [address]  Lipase, blood     Status: None   Collection Time: 06/19/19  8:09 PM  Result Value Ref Range   Lipase 29 11 - 51 U/L    Comment: Performed at [facility], [place] [address], [place], [place] [address]  Comprehensive metabolic panel     Status: Abnormal   Collection Time: 06/19/19  8:09 PM  Result Value Ref Range   Sodium 130 (L) 135  - 145 mmol/L   Potassium 4.2 3.5 - 5.1 mmol/L   Chloride 94 (L) 98 - 111 mmol/L   CO2 24 22 - 32 mmol/L   Glucose, Bld 119 (H) 70 - 99 mg/dL   BUN 13 8 - 23 mg/dL   Creatinine, Ser 0.89 0.44 - 1.00 mg/dL   Calcium 9.6 8.9 - 10.3 mg/dL   Total Protein 7.0 6.5 - 8.1 g/dL   Albumin 3.9 3.5 - 5.0 g/dL   AST 18 15 - 41 U/L   ALT 12 0 - 44 U/L  Alkaline Phosphatase 83 38 - 126 U/L   Total Bilirubin 0.3 0.3 - 1.2 mg/dL   GFR calc non Af Amer >60 >60 mL/min   GFR calc Af Amer >60 >60 mL/min   Anion gap 12 5 - 15    Comment: Performed at [facility], [place] [address], [place], [place] [address]  CBC     Status: Abnormal   Collection Time: 06/19/19  8:09 PM  Result Value Ref Range   WBC 12.3 (H) 4.0 - 10.5 K/uL   RBC 4.06 3.87 - 5.11 MIL/uL   Hemoglobin 12.5 12.0 - 15.0 g/dL   HCT 38.1 36.0 - 46.0 %   MCV 93.8 80.0 - 100.0 fL   MCH 30.8 26.0 - 34.0 pg   MCHC 32.8 30.0 - 36.0 g/dL   RDW 12.4 11.5 - 15.5 %   Platelets 325 150 - 400 K/uL   nRBC 0.0 0.0 - 0.2 %    Comment: Performed at [facility], [place] [address] [place], [place] [address]  Magnesium     Status: None   Collection Time: 06/19/19  8:09 PM  Result Value Ref Range   Magnesium 2.0 1.7 - 2.4 mg/dL    Comment: Performed at [facility], [place] [address], [place], [place] [address]  Phosphorus     Status: None   Collection Time: 06/19/19  8:09 PM  Result Value Ref Range   Phosphorus 3.9 2.5 - 4.6 mg/dL    Comment: Performed at [facility], [place] [address], [place],  [address]  SARS Coronavirus 2 [facility] order, Performed in [facility] hospital lab) Nasopharyngeal Nasopharyngeal Swab     Status: None   Collection Time: 06/20/19 12:18 AM   Specimen: Nasopharyngeal Swab  Result Value Ref Range   SARS Coronavirus 2 NEGATIVE NEGATIVE    Comment: (NOTE) If result is NEGATIVE SARS-CoV-2 target nucleic acids are NOT DETECTED. The SARS-CoV-2 RNA  is generally detectable in upper and lower  respiratory specimens during the acute phase of infection. The lowest  concentration of SARS-CoV-2 viral copies this assay can detect is 250  copies / mL. A negative result does not preclude SARS-CoV-2 infection  and should not be used as the sole basis for treatment or other  patient management decisions.  A negative result may occur with  improper specimen collection / handling, submission of specimen other  than nasopharyngeal swab, presence of viral mutation(s) within the  areas targeted by this assay, and inadequate number of viral copies  (<250 copies / mL). A negative result must be combined with clinical  observations, patient history, and epidemiological information. If result is POSITIVE SARS-CoV-2 target nucleic acids are DETECTED. The SARS-CoV-2 RNA is generally detectable in upper and lower  respiratory specimens dur ing the acute phase of infection.  Positive  results are indicative of active infection with SARS-CoV-2.  Clinical  correlation with patient history and other diagnostic information is  necessary to determine patient infection status.  Positive results do  not rule out bacterial infection or co-infection with other viruses. If result is PRESUMPTIVE POSTIVE SARS-CoV-2 nucleic acids MAY BE PRESENT.   A presumptive positive result was obtained on the submitted specimen  and confirmed on repeat testing.  While 2019 novel coronavirus  (SARS-CoV-2) nucleic acids may be present in the submitted sample  additional confirmatory testing may be necessary for epidemiological  and / or clinical management purposes  to differentiate between  SARS-CoV-2 and other Sarbecovirus  currently known to infect humans.  If clinically indicated additional testing with an alternate test  methodology [phone]) is advised. The SARS-CoV-2 RNA is generally  detectable in upper and lower respiratory sp ecimens during the acute  phase of  infection. The expected result is Negative. Fact Sheet for Patients:  StrictlyIdeas.no Fact Sheet for Healthcare Providers: BankingDealers.co.za This test is not yet approved or cleared by the [place] FDA and has been authorized for detection and/or diagnosis of SARS-CoV-2 by FDA under an Emergency Use Authorization (EUA).  This EUA will remain in effect (meaning this test can be used) for the duration of the COVID-19 declaration under Section 564(b)(1) of the Act, 21 U.S.C. section 360bbb-3(b)(1), unless the authorization is terminated or revoked sooner. Performed at [facility], [place] [address] [place], [place] [address]    Ct Abdomen Pelvis W Contrast  Result Date: 06/20/2019 CLINICAL DATA:  Abdominal pain, nausea, vomiting EXAM: CT ABDOMEN AND PELVIS WITH CONTRAST TECHNIQUE: Multidetector CT imaging of the abdomen and pelvis was performed using the standard protocol following bolus administration of intravenous contrast. CONTRAST:  53mL OMNIPAQUE IOHEXOL 300 MG/ML  SOLN COMPARISON:  CT abdomen pelvis 01/04/2019 FINDINGS: Lower chest: Interlobular septal thickening is present in the bases with dependent ground-glass opacity posteriorly likely reflecting atelectasis. Atherosclerotic calcification of the coronary arteries. Hepatobiliary: No focal liver abnormality is seen. Patient is post cholecystectomy. Slight prominence of the biliary tree likely related to reservoir effect. No calcified intraductal gallstones. Pancreas: Unremarkable. No pancreatic ductal dilatation or surrounding inflammatory changes. Spleen: Normal in size without focal abnormality. Adrenals/Urinary Tract: Normal adrenal glands. Scattered subcentimeter hypoattenuating foci in the kidneys, too small to characterize though statistically likely benign. Stomach/Bowel: Marked distension of the stomach with ingested contrast medium. Minimal passage of contrast  into the small bowel loops. Multiple air-filled loops are present in the mid abdomen but without frank dilatation. There are several loops of mildly thickened distal ileum in the right lower quadrant along the suture line at the site of ileostomy take down. Mild thickening of the cecum and ascending colon is present as well with fluid in the right paracolic gutter. Postsurgical changes from prior low anterior resection noted in the pelvis. New low-attenuation fluid collection is seen presacral space intimate with the distal colonic anastomosis. Vascular/Lymphatic: Atherosclerotic plaque within the normal caliber aorta. No suspicious or enlarged lymph nodes in the included lymphatic chains. Reproductive: Uterus is surgically absent. No concerning adnexal lesions. Other: New presacral collection detailed in stomach/bowel above. Additional fluid tracking in the right paracolic gutter. No free intraperitoneal air postsurgical changes of the right lower quadrant at the site of ileostomy takedown. Mild body wall edema. Musculoskeletal: No acute osseous abnormality or suspicious osseous lesion. Insert Degen spine findings maximal at the L5-S1 level with sclerotic endplate changes. No suspicious osseous lesions. IMPRESSION: 1. Postsurgical changes from recent ileostomy take-down with several questionably thickened loops of distal small bowel and colon along a suture line seen in the right lower quadrant near the operative site. More proximal bowel appears air filled with delayed transit of enteric contrast. Could suggest either ileus or less likely partial obstruction. 2. There are additional postsurgical changes in the low pelvis at the distal colonic anastomosis with a new low-attenuation presacral fluid collection. No convincing rim enhancement or extraluminal gas is evident. Differential could include reactive free fluid versus more insidious processes such as an anastomotic leak or developing abscess. 3. Aortic  Atherosclerosis (ICD10-I70.0). These results were called by telephone at the  time of interpretation on 06/20/2019 at 1:56 am to Dr. Rodell Perna , who verbally acknowledged these results. Electronically Signed   By: Lovena Le M.D.   On: 06/20/2019 01:56   Dg Chest Portable 1 View  Result Date: 06/20/2019 CLINICAL DATA:  Difficulty with deep inspiration EXAM: PORTABLE CHEST 1 VIEW COMPARISON:  CT Mar 03, 2018, radiograph July 27 2018 FINDINGS: Diffuse hazy interstitial opacities with septal thickening and indistinct pulmonary vascularity. The heart appears enlarged when compared to prior portable radiographs. Remaining cardiomediastinal contours are unremarkable. No acute osseous or soft tissue abnormality. Mask wire at the base of the neck. IMPRESSION: Cardiomegaly with interstitial pulmonary edema. Electronically Signed   By: Lovena Le M.D.   On: 06/20/2019 01:04    Pending Labs Unresulted Labs (From admission, onward)    Start     Ordered   06/21/19 0500  HIV antibody (Routine Testing)  Tomorrow morning,   R     06/20/19 0229   06/20/19 0500  Comprehensive metabolic panel  Tomorrow morning,   R     06/20/19 0229   06/20/19 0500  CBC  Tomorrow morning,   R     06/20/19 0229          Vitals/Pain Today's Vitals   06/20/19 0115 06/20/19 0200 06/20/19 0230 06/20/19 0313  BP: 129/75 127/74 129/81 121/74  Pulse: 86 64 84 74  Resp: 17 11 15 15   Temp:      TempSrc:      SpO2: 98% 91% 90% 96%  PainSc:        Isolation Precautions No active isolations  Medications Medications  0.9 %  sodium chloride infusion ( Intravenous New Bag/Given 06/20/19 0308)  pantoprazole (PROTONIX) injection 40 mg (40 mg Intravenous Given 06/20/19 0310)  acetaminophen (TYLENOL) tablet 650 mg (has no administration in time range)    Or  acetaminophen (TYLENOL) suppository 650 mg (has no administration in time range)  prochlorperazine (COMPAZINE) injection 5 mg (5 mg Intravenous Given 06/20/19 0309)   morphine 4 MG/ML injection 4 mg (4 mg Intravenous Given 06/19/19 2210)  ondansetron (ZOFRAN) injection 4 mg (4 mg Intravenous Given 06/19/19 2210)  sodium chloride 0.9 % bolus 1,000 mL (0 mLs Intravenous Stopped 06/19/19 2333)  iohexol (OMNIPAQUE) 300 MG/ML solution 30 mL (30 mLs Oral Contrast Given 06/19/19 2229)  ondansetron (ZOFRAN) injection 4 mg (4 mg Intravenous Given 06/20/19 0051)  iohexol (OMNIPAQUE) 300 MG/ML solution 100 mL (80 mLs Intravenous Contrast Given 06/20/19 0058)    Mobility walks Low fall risk

## 2019-06-20 NOTE — Telephone Encounter (Signed)
Deborah Arroyo, FYI 

## 2019-06-20 NOTE — Telephone Encounter (Signed)
Spoke with pt. Son Zenia Resides who informed me that pt." Was admitted to hospital last night with  a lot of pain and they think that she may have a blockage",informed Zenia Resides that I would cancel this visit and procedure scheduled for 07/05/19 , that when she is better they can call back and reschedule,he verbalize understanding.

## 2019-06-21 ENCOUNTER — Inpatient Hospital Stay (HOSPITAL_COMMUNITY): Payer: Medicare Other

## 2019-06-21 DIAGNOSIS — K5669 Other partial intestinal obstruction: Secondary | ICD-10-CM

## 2019-06-21 MED ORDER — ACETAMINOPHEN 325 MG PO TABS: 650.0000 mg | ORAL_TABLET | Freq: Four times a day (QID) | ORAL | 1 refills | Status: AC | PRN

## 2019-06-21 MED ORDER — SENNOSIDES-DOCUSATE SODIUM 8.6-50 MG PO TABS: 2.0000 | ORAL_TABLET | Freq: Every day | ORAL | 2 refills | Status: DC

## 2019-06-21 MED ORDER — PANTOPRAZOLE SODIUM 40 MG PO TBEC
40.0000 mg | DELAYED_RELEASE_TABLET | Freq: Every day | ORAL | Status: DC
  Administered 2019-06-21: 40 mg via ORAL
  Filled 2019-06-21: qty 1

## 2019-06-21 MED ORDER — ONDANSETRON HCL 4 MG PO TABS: 4.0000 mg | ORAL_TABLET | Freq: Every day | ORAL | 0 refills | Status: DC | PRN

## 2019-06-21 NOTE — Discharge Summary (Signed)
Deborah Arroyo, is a 68 y.o. female  DOB [birth year]  MRN [id number].  Admission date:  06/19/2019  Admitting Physician  Reubin Milan, MD  Discharge Date:  06/21/2019   Primary MD  Delice Bison, DO  Recommendations for primary care physician for things to follow:   1) avoid large amounts of nuts and fruits 2) maintain adequate hydration/drink enough fluids to avoid constipation 3) please follow-up with your general surgeon as advised by the surgical physician/team   Admission Diagnosis  Generalized abdominal pain [R10.84] Hypoxia [R09.02]   Discharge Diagnosis  Generalized abdominal pain [R10.84] Hypoxia [R09.02]    Principal Problem:   Abdominal pain Active Problems:   Tobacco abuse   Hyponatremia   Leukocytosis   Bowel obstruction (HCC)     Past Medical History:  Diagnosis Date   Benign neoplasm of descending colon    Benign neoplasm of transverse colon    Cancer ([place]) 03/01/2018   rectal cancer=had chemo pills and radation    Cataract    Cervix cancer ([place]) 1979   Depression    Dyspnea    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   Hypoglycemia    Hyponatremia    syncope with this and admitted twice due to this issue    Osteopenia    Rectal bleeding    Rectal cancer s/p LAR/diverting loop ileostomy 07/12/2018 03/06/2018   Smoker     Past Surgical History:  Procedure Laterality Date   ABDOMINAL HYSTERECTOMY     APPENDECTOMY     BIOPSY  07/04/2018   Procedure: BIOPSY;  Surgeon: Jerene Bears, MD;  Location: [facility];  Service: Gastroenterology;;   CHOLECYSTECTOMY     COLONOSCOPY  03/01/2018   Dr.Pyrtle   COLONOSCOPY WITH PROPOFOL N/A 07/04/2018   Procedure: COLONOSCOPY WITH PROPOFOL;  Surgeon: Jerene Bears, MD;  Location: [facility];  Service: Gastroenterology;  Laterality: N/A;   CYSTOSCOPY WITH STENT PLACEMENT Bilateral 07/12/2018    Procedure: CYSTOSCOPY WITH STENT PLACEMENT;  Surgeon: Lucas Mallow, MD;  Location: [facility];  Service: Urology;  Laterality: Bilateral;   DIVERTING ILEOSTOMY N/A 07/12/2018   Procedure: DIVERTING LOOP ILEOSTOMY;  Surgeon: Ileana Roup, MD;  Location: [facility];  Service: General;  Laterality: N/A;   EYE SURGERY     FLEXIBLE SIGMOIDOSCOPY N/A 07/12/2018   Procedure: FLEXIBLE SIGMOIDOSCOPY;  Surgeon: Ileana Roup, MD;  Location: [facility];  Service: General;  Laterality: N/A;   FLEXIBLE SIGMOIDOSCOPY N/A 12/04/2018   Procedure: FLEXIBLE SIGMOIDOSCOPY;  Surgeon: Ileana Roup, MD;  Location: [facility];  Service: General;  Laterality: N/A;   ILEO LOOP COLOSTOMY CLOSURE N/A 04/20/2019   Procedure: OPEN TAKEDOWN OF ILEOSTOMY, primary repair of parastomal hernia;  Surgeon: Ileana Roup, MD;  Location: [facility];  Service: General;  Laterality: N/A;   LAPAROSCOPIC LOW ANTERIOR RESECTION N/A 07/12/2018   Procedure: LAPAROSCOPIC LOW ANTERIOR RESECTION WITH COLOPROSTOSTOMY, ERAS PATHWAY;  Surgeon: Ileana Roup, MD;  Location: [facility];  Service: General;  Laterality: N/A;  POLYPECTOMY  07/04/2018   Procedure: POLYPECTOMY;  Surgeon: Jerene Bears, MD;  Location: [facility];  Service: Gastroenterology;;   SHOULDER SURGERY Left    Rotary Cuff Tear   TUBAL LIGATION         HPI  from the history and physical done on the day of admission:    Chief Complaint: Abdominal pain.  HPI: Deborah Arroyo is a 68 y.o. female with medical history significant of benign neoplasm of colon, rectal cancer, diverting ileostomy, high output ileostomy, hypoglycemia cataracts, cervix cancer, depression, dyspnea, hyponatremia, osteopenia, cigarette smoker who is coming to the emergency department due to abdominal pain associated with 3 episodes of emesis since yesterday.  She denies constipation and states that her last BM was yesterday morning.  She had 2 episodes of emesis before arriving  to the ER and then vomited once in the emergency department yesterday evening.  She denies fever, chills, sore throat, rhinorrhea, dyspnea, hemoptysis, chest pain, palpitations, dizziness, diaphoresis, PND, orthopnea or pitting edema of the lower extremities.  No dysuria, frequency or hematuria.  Denies polyuria, polydipsia, polyphagia or blurred vision.  ED Course: Initial vital signs temperature 97.7 F, pulse 64, respirations 10, blood pressure 135/78 mmHg and O2 sat 96% on room air.  Patient received a 1000 mL NS bolus, ondansetron 4 mg IVP x1, morphine sulfate 4 mg IVP x2.  General surgery (Dr. Lucia Gaskins) was contacted and will evaluate the patient in a.m.  Her urinalysis showed moderate hemoglobinuria and 20 mg/dL of ketones.  The rest of the UA is unremarkable.  CBC showed a white count of 12.4, hemoglobin 12.5 g/dL and platelets 325.  CMP shows a sodium of 139 chloride 94 mmol/L.  Glucose 119 mg/dL.  All other chemistry values, including lipase, were within normal limits.  SARS coronavirus 2 nasopharyngeal swab was negative.  Imaging: her 1 view chest radiograph showed cardiomegaly with interstitial pulmonary edema.  CT abdomen/pelvis shows postsurgical changes from recent ileostomy take-down with several questionable thickened loops of distal small bowel and colon along suture line seen in the right lower quadrant near appear at this site.  There are additional postsurgical changes in the low pelvis of the distal colonic anastomosis.  Please see images and full radiology report for further detail.     Hospital Course:     Brief Summary 68 y.o.femalewith medical history significant for rectal cancer, s/p diverting ileostomy with subsequent DLI takedown 04/20/2019 , h/o cervix cancer, depression, dyspnea, hyponatremia, osteopenia, cigarette smoker--admitted on 06/20/2019 with persistent abdominal pain nausea and vomiting with clinical and imaging findings suggestive of partial small bowel  obstruction   A/p 1)Partial small bowel obstruction--patient was admitted with persistent emesis and abdominal pain , -Patient refused NG tube for decompression --GI symptoms resolved spontaneously, nausea and vomiting resolved, abdominal pain resolved, patient had multiple BMs, per advice of general surgeon patient was restarted on oral intake -Patient tolerated oral intake well, general surgeon advised discharge home with outpatient follow-up -Ileitis/enteritis less likely   2) history of rectal cancer--- status post prior neoadjuvant chemoXRT and subsequently underwent laparoscopic LAR with diverting loop ileostomy 07/12/18 with anastomosis 4.5 cm from anal verge with subsequent DLI takedown 04/20/2019 --- surgical input appreciated  Discharge Condition: stable  Follow UP--general surgeon as advised   Consults obtained -gen surgery  Diet and Activity recommendation:  As advised  Discharge Instructions    Discharge Instructions    Call MD for:  difficulty breathing, headache or visual disturbances   Complete by: As  directed    Call MD for:  persistant dizziness or light-headedness   Complete by: As directed    Call MD for:  persistant nausea and vomiting   Complete by: As directed    Call MD for:  severe uncontrolled pain   Complete by: As directed    Call MD for:  temperature >100.4   Complete by: As directed    Diet - low sodium heart healthy   Complete by: As directed    Discharge instructions   Complete by: As directed    1) avoid large amounts of nuts and fruits 2) maintain adequate hydration/drink enough fluids to avoid constipation 3) please follow-up with your general surgeon as advised by the surgical physician/team   Increase activity slowly   Complete by: As directed        Discharge Medications     Allergies as of 06/21/2019      Reactions   Aleve [naproxen Sodium] Swelling, Other (See Comments)   Eyes and throat swell up      Medication List     STOP taking these medications   cyclobenzaprine 10 MG tablet Commonly known as: FLEXERIL   psyllium 95 % Pack Commonly known as: HYDROCIL/METAMUCIL     TAKE these medications   acetaminophen 325 MG tablet Commonly known as: TYLENOL Take 2 tablets (650 mg total) by mouth every 6 (six) hours as needed for mild pain, fever or headache (or Fever >/= 101). What changed:   medication strength  how much to take  reasons to take this   alendronate 70 MG tablet Commonly known as: Fosamax Take 1 tablet (70 mg total) by mouth every 7 (seven) days. Take with a full glass of water on an empty stomach.   calcium carbonate 1500 (600 Ca) MG Tabs tablet Commonly known as: Caltrate 600 Take 1 tablet (1,500 mg total) by mouth 2 (two) times daily with a meal.   ondansetron 4 MG tablet Commonly known as: Zofran Take 1 tablet (4 mg total) by mouth daily as needed for nausea or vomiting.   senna-docusate 8.6-50 MG tablet Commonly known as: Senokot-S Take 2 tablets by mouth at bedtime.   traMADol 50 MG tablet Commonly known as: ULTRAM Take 50 mg by mouth every 6 (six) hours as needed for moderate pain or severe pain.      Major procedures and Radiology Reports - PLEASE review detailed and final reports for all details, in brief -    Ct Abdomen Pelvis W Contrast  Result Date: 06/20/2019 CLINICAL DATA:  Abdominal pain, nausea, vomiting EXAM: CT ABDOMEN AND PELVIS WITH CONTRAST TECHNIQUE: Multidetector CT imaging of the abdomen and pelvis was performed using the standard protocol following bolus administration of intravenous contrast. CONTRAST:  73mL OMNIPAQUE IOHEXOL 300 MG/ML  SOLN COMPARISON:  CT abdomen pelvis 01/04/2019 FINDINGS: Lower chest: Interlobular septal thickening is present in the bases with dependent ground-glass opacity posteriorly likely reflecting atelectasis. Atherosclerotic calcification of the coronary arteries. Hepatobiliary: No focal liver abnormality is seen. Patient is  post cholecystectomy. Slight prominence of the biliary tree likely related to reservoir effect. No calcified intraductal gallstones. Pancreas: Unremarkable. No pancreatic ductal dilatation or surrounding inflammatory changes. Spleen: Normal in size without focal abnormality. Adrenals/Urinary Tract: Normal adrenal glands. Scattered subcentimeter hypoattenuating foci in the kidneys, too small to characterize though statistically likely benign. Stomach/Bowel: Marked distension of the stomach with ingested contrast medium. Minimal passage of contrast into the small bowel loops. Multiple air-filled loops are present in the mid  abdomen but without frank dilatation. There are several loops of mildly thickened distal ileum in the right lower quadrant along the suture line at the site of ileostomy take down. Mild thickening of the cecum and ascending colon is present as well with fluid in the right paracolic gutter. Postsurgical changes from prior low anterior resection noted in the pelvis. New low-attenuation fluid collection is seen presacral space intimate with the distal colonic anastomosis. Vascular/Lymphatic: Atherosclerotic plaque within the normal caliber aorta. No suspicious or enlarged lymph nodes in the included lymphatic chains. Reproductive: Uterus is surgically absent. No concerning adnexal lesions. Other: New presacral collection detailed in stomach/bowel above. Additional fluid tracking in the right paracolic gutter. No free intraperitoneal air postsurgical changes of the right lower quadrant at the site of ileostomy takedown. Mild body wall edema. Musculoskeletal: No acute osseous abnormality or suspicious osseous lesion. Insert Degen spine findings maximal at the L5-S1 level with sclerotic endplate changes. No suspicious osseous lesions. IMPRESSION: 1. Postsurgical changes from recent ileostomy take-down with several questionably thickened loops of distal small bowel and colon along a suture line seen in  the right lower quadrant near the operative site. More proximal bowel appears air filled with delayed transit of enteric contrast. Could suggest either ileus or less likely partial obstruction. 2. There are additional postsurgical changes in the low pelvis at the distal colonic anastomosis with a new low-attenuation presacral fluid collection. No convincing rim enhancement or extraluminal gas is evident. Differential could include reactive free fluid versus more insidious processes such as an anastomotic leak or developing abscess. 3. Aortic Atherosclerosis (ICD10-I70.0). These results were called by telephone at the time of interpretation on 06/20/2019 at 1:56 am to Dr. Rodell Perna , who verbally acknowledged these results. Electronically Signed   By: Lovena Le M.D.   On: 06/20/2019 01:56   Dg Chest Portable 1 View  Result Date: 06/20/2019 CLINICAL DATA:  Difficulty with deep inspiration EXAM: PORTABLE CHEST 1 VIEW COMPARISON:  CT Mar 03, 2018, radiograph July 27 2018 FINDINGS: Diffuse hazy interstitial opacities with septal thickening and indistinct pulmonary vascularity. The heart appears enlarged when compared to prior portable radiographs. Remaining cardiomediastinal contours are unremarkable. No acute osseous or soft tissue abnormality. Mask wire at the base of the neck. IMPRESSION: Cardiomegaly with interstitial pulmonary edema. Electronically Signed   By: Lovena Le M.D.   On: 06/20/2019 01:04   Dg Abd Portable 1v  Result Date: 06/21/2019 CLINICAL DATA:  Follow up small bowel obstruction EXAM: PORTABLE ABDOMEN - 1 VIEW COMPARISON:  06/20/2019 FINDINGS: Previously administered contrast now lies throughout the colon. No obstructive changes are seen. No free air is noted. Changes of prior cholecystectomy are seen. No bony abnormality is noted. IMPRESSION: Administered contrast now lies throughout the colon. No obstructive changes are seen. Electronically Signed   By: Inez Catalina M.D.   On:  06/21/2019 10:25   Micro Results   Recent Results (from the past 240 hour(s))  SARS Coronavirus 2 [facility] order, Performed in [facility] hospital lab) Nasopharyngeal Nasopharyngeal Swab     Status: None   Collection Time: 06/20/19 12:18 AM   Specimen: Nasopharyngeal Swab  Result Value Ref Range Status   SARS Coronavirus 2 NEGATIVE NEGATIVE Final    Comment: (NOTE) If result is NEGATIVE SARS-CoV-2 target nucleic acids are NOT DETECTED. The SARS-CoV-2 RNA is generally detectable in upper and lower  respiratory specimens during the acute phase of infection. The lowest  concentration of SARS-CoV-2 viral copies this assay can detect  is 250  copies / mL. A negative result does not preclude SARS-CoV-2 infection  and should not be used as the sole basis for treatment or other  patient management decisions.  A negative result may occur with  improper specimen collection / handling, submission of specimen other  than nasopharyngeal swab, presence of viral mutation(s) within the  areas targeted by this assay, and inadequate number of viral copies  (<250 copies / mL). A negative result must be combined with clinical  observations, patient history, and epidemiological information. If result is POSITIVE SARS-CoV-2 target nucleic acids are DETECTED. The SARS-CoV-2 RNA is generally detectable in upper and lower  respiratory specimens dur ing the acute phase of infection.  Positive  results are indicative of active infection with SARS-CoV-2.  Clinical  correlation with patient history and other diagnostic information is  necessary to determine patient infection status.  Positive results do  not rule out bacterial infection or co-infection with other viruses. If result is PRESUMPTIVE POSTIVE SARS-CoV-2 nucleic acids MAY BE PRESENT.   A presumptive positive result was obtained on the submitted specimen  and confirmed on repeat testing.  While 2019 novel coronavirus  (SARS-CoV-2) nucleic acids  may be present in the submitted sample  additional confirmatory testing may be necessary for epidemiological  and / or clinical management purposes  to differentiate between  SARS-CoV-2 and other Sarbecovirus currently known to infect humans.  If clinically indicated additional testing with an alternate test  methodology [id number]) is advised. The SARS-CoV-2 RNA is generally  detectable in upper and lower respiratory sp ecimens during the acute  phase of infection. The expected result is Negative. Fact Sheet for Patients:  StrictlyIdeas.no Fact Sheet for Healthcare Providers: BankingDealers.co.za This test is not yet approved or cleared by the [place] FDA and has been authorized for detection and/or diagnosis of SARS-CoV-2 by FDA under an Emergency Use Authorization (EUA).  This EUA will remain in effect (meaning this test can be used) for the duration of the COVID-19 declaration under Section 564(b)(1) of the Act, 21 U.S.C. section 360bbb-3(b)(1), unless the authorization is terminated or revoked sooner. Performed at [facility], [place] [address] [place], [place] [address]      Today   Subjective    Deborah Arroyo today has no new concerns, eating and drinking well, no further emesis, no abdominal pain, no fever no chills, patient desires to go home after tolerating oral intake          Patient has been seen and examined prior to discharge   Objective   Blood pressure (!) 101/58, pulse 74, temperature 97.9 F (36.6 C), temperature source Oral, resp. rate 18, SpO2 93 %.   Intake/Output Summary (Last 24 hours) at 06/21/2019 1320 Last data filed at 06/21/2019 0600 Gross per 24 hour  Intake 2263.12 ml  Output 1001 ml  Net 1262.12 ml    Exam Gen:- Awake Alert, no acute distress  HEENT:- .AT, No sclera icterus Neck-Supple Neck,No JVD,.  Lungs-  CTAB , good air movement bilaterally  CV- S1, S2 normal,  regular Abd-  +ve B.Sounds, Abd Soft, No tenderness, scars from prior laparotomy Extremity/Skin:- No  edema,   good pulses Psych-affect is appropriate, oriented x3 Neuro-no new focal deficits, no tremors    Data Review   CBC w Diff:  Lab Results  Component Value Date   WBC 14.3 (H) 06/20/2019   HGB 13.7 06/20/2019   HGB 13.8 11/16/2018   HGB 14.7 12/14/2017  HCT 41.2 06/20/2019   HCT 42.3 12/14/2017   PLT 330 06/20/2019   PLT 281 11/16/2018   PLT 431 (H) 12/14/2017   LYMPHOPCT 10 04/23/2019   MONOPCT 9 04/23/2019   EOSPCT 2 04/23/2019   BASOPCT 1 04/23/2019    CMP:  Lab Results  Component Value Date   NA 130 (L) 06/20/2019   NA 135 08/24/2017   K 3.8 06/20/2019   CL 97 (L) 06/20/2019   CO2 22 06/20/2019   BUN 10 06/20/2019   BUN 9 08/24/2017   CREATININE 0.82 06/20/2019   CREATININE 1.14 (H) 11/16/2018   PROT 6.7 06/20/2019   PROT 6.9 08/24/2017   ALBUMIN 3.9 06/20/2019   ALBUMIN 4.4 08/24/2017   BILITOT 0.6 06/20/2019   BILITOT 0.5 11/16/2018   ALKPHOS 92 06/20/2019   AST 18 06/20/2019   AST 22 11/16/2018   ALT 11 06/20/2019   ALT 14 11/16/2018  .   Total Discharge time is about 33 minutes  Roxan Hockey M.D on 06/21/2019 at 1:20 PM  Go to [email or web address] -  for contact info  [facility] - Office  [phone]

## 2019-06-21 NOTE — Progress Notes (Signed)
Discharge and medication instructions reviewed with patient . Questions answered and patient denies further questions. No prescriptions given. Family member is en route to take patient home. Donne Hazel, RN

## 2019-06-21 NOTE — Discharge Instructions (Signed)
1) avoid large amounts of nuts and fruits 2) maintain adequate hydration/drink enough fluids to avoid constipation 3) please follow-up with your general surgeon as advised by the surgical physician/team

## 2019-06-21 NOTE — Progress Notes (Signed)
Patient ID: Deborah Arroyo, female   DOB: Mar 30, 1951, 68 y.o.   MRN: [id number]       Subjective: Feeling much better. Has had 3 BMs. Distention now gone. Denies n/v. Denies abdominal pain. XR shows contrast has progressed to colon  Objective: Vital signs in last 24 hours: Temp:  [97.9 F (36.6 C)-98.6 F (37 C)] 97.9 F (36.6 C) (08/20 0550) Pulse Rate:  [74-94] 74 (08/20 0550) Resp:  [16-18] 18 (08/20 0550) BP: (101-110)/(58-73) 101/58 (08/20 0550) SpO2:  [86 %-95 %] 93 % (08/20 0550) Last BM Date: 06/20/19  Intake/Output from previous day: 08/19 0701 - 08/20 0700 In: 2263.1 [I.V.:2263.1] Out: 1101 [Urine:1100; Stool:1] Intake/Output this shift: No intake/output data recorded.  PE: Heart: regular Lungs: CTAB Abd: soft, NT/ND  Lab Results:  Recent Labs    06/19/19 2009 06/20/19 0534  WBC 12.3* 14.3*  HGB 12.5 13.7  HCT 38.1 41.2  PLT 325 330   BMET Recent Labs    06/19/19 2009 06/20/19 0534  NA 130* 130*  K 4.2 3.8  CL 94* 97*  CO2 24 22  GLUCOSE 119* 107*  BUN 13 10  CREATININE 0.89 0.82  CALCIUM 9.6 9.1   PT/INR No results for input(s): LABPROT, INR in the last 72 hours. CMP     Component Value Date/Time   NA 130 (L) 06/20/2019 0534   NA 135 08/24/2017 1531   K 3.8 06/20/2019 0534   CL 97 (L) 06/20/2019 0534   CO2 22 06/20/2019 0534   GLUCOSE 107 (H) 06/20/2019 0534   GLUCOSE 95 03/28/2014 1224   BUN 10 06/20/2019 0534   BUN 9 08/24/2017 1531   CREATININE 0.82 06/20/2019 0534   CREATININE 1.14 (H) 11/16/2018 1351   CALCIUM 9.1 06/20/2019 0534   PROT 6.7 06/20/2019 0534   PROT 6.9 08/24/2017 1531   ALBUMIN 3.9 06/20/2019 0534   ALBUMIN 4.4 08/24/2017 1531   AST 18 06/20/2019 0534   AST 22 11/16/2018 1351   ALT 11 06/20/2019 0534   ALT 14 11/16/2018 1351   ALKPHOS 92 06/20/2019 0534   BILITOT 0.6 06/20/2019 0534   BILITOT 0.5 11/16/2018 1351   GFRNONAA >60 06/20/2019 0534   GFRNONAA 49 (L) 11/16/2018 1351   GFRAA >60 06/20/2019 0534    GFRAA 57 (L) 11/16/2018 1351   Lipase     Component Value Date/Time   LIPASE 29 06/19/2019 2009       Studies/Results: Ct Abdomen Pelvis W Contrast  Result Date: 06/20/2019 CLINICAL DATA:  Abdominal pain, nausea, vomiting EXAM: CT ABDOMEN AND PELVIS WITH CONTRAST TECHNIQUE: Multidetector CT imaging of the abdomen and pelvis was performed using the standard protocol following bolus administration of intravenous contrast. CONTRAST:  77mL OMNIPAQUE IOHEXOL 300 MG/ML  SOLN COMPARISON:  CT abdomen pelvis 01/04/2019 FINDINGS: Lower chest: Interlobular septal thickening is present in the bases with dependent ground-glass opacity posteriorly likely reflecting atelectasis. Atherosclerotic calcification of the coronary arteries. Hepatobiliary: No focal liver abnormality is seen. Patient is post cholecystectomy. Slight prominence of the biliary tree likely related to reservoir effect. No calcified intraductal gallstones. Pancreas: Unremarkable. No pancreatic ductal dilatation or surrounding inflammatory changes. Spleen: Normal in size without focal abnormality. Adrenals/Urinary Tract: Normal adrenal glands. Scattered subcentimeter hypoattenuating foci in the kidneys, too small to characterize though statistically likely benign. Stomach/Bowel: Marked distension of the stomach with ingested contrast medium. Minimal passage of contrast into the small bowel loops. Multiple air-filled loops are present in the mid abdomen but without frank dilatation. There are  several loops of mildly thickened distal ileum in the right lower quadrant along the suture line at the site of ileostomy take down. Mild thickening of the cecum and ascending colon is present as well with fluid in the right paracolic gutter. Postsurgical changes from prior low anterior resection noted in the pelvis. New low-attenuation fluid collection is seen presacral space intimate with the distal colonic anastomosis. Vascular/Lymphatic: Atherosclerotic  plaque within the normal caliber aorta. No suspicious or enlarged lymph nodes in the included lymphatic chains. Reproductive: Uterus is surgically absent. No concerning adnexal lesions. Other: New presacral collection detailed in stomach/bowel above. Additional fluid tracking in the right paracolic gutter. No free intraperitoneal air postsurgical changes of the right lower quadrant at the site of ileostomy takedown. Mild body wall edema. Musculoskeletal: No acute osseous abnormality or suspicious osseous lesion. Insert Degen spine findings maximal at the L5-S1 level with sclerotic endplate changes. No suspicious osseous lesions. IMPRESSION: 1. Postsurgical changes from recent ileostomy take-down with several questionably thickened loops of distal small bowel and colon along a suture line seen in the right lower quadrant near the operative site. More proximal bowel appears air filled with delayed transit of enteric contrast. Could suggest either ileus or less likely partial obstruction. 2. There are additional postsurgical changes in the low pelvis at the distal colonic anastomosis with a new low-attenuation presacral fluid collection. No convincing rim enhancement or extraluminal gas is evident. Differential could include reactive free fluid versus more insidious processes such as an anastomotic leak or developing abscess. 3. Aortic Atherosclerosis (ICD10-I70.0). These results were called by telephone at the time of interpretation on 06/20/2019 at 1:56 am to Dr. Rodell Perna , who verbally acknowledged these results. Electronically Signed   By: Lovena Le M.D.   On: 06/20/2019 01:56   Dg Chest Portable 1 View  Result Date: 06/20/2019 CLINICAL DATA:  Difficulty with deep inspiration EXAM: PORTABLE CHEST 1 VIEW COMPARISON:  CT Mar 03, 2018, radiograph July 27 2018 FINDINGS: Diffuse hazy interstitial opacities with septal thickening and indistinct pulmonary vascularity. The heart appears enlarged when compared  to prior portable radiographs. Remaining cardiomediastinal contours are unremarkable. No acute osseous or soft tissue abnormality. Mask wire at the base of the neck. IMPRESSION: Cardiomegaly with interstitial pulmonary edema. Electronically Signed   By: Lovena Le M.D.   On: 06/20/2019 01:04    Anti-infectives: Anti-infectives (From admission, onward)   None       Assessment/Plan Rectal cancer, s/p LAR with diverting ileostomy and subsequent takedown on 04/20/19 by Dr. Dema Severin  Abdominal pain, psbo -Likely had pSBO from a large volume of walnuts she ate - ~2 cups of nuts - reports having done this a couple hours before her symptoms began -Advanced to soft diet -She may go home later today if she eats well; we discussed avoiding large volumes of nuts or raw fruit.  Sharon Mt. Dema Severin, M.D. [facility]

## 2019-06-21 NOTE — Progress Notes (Signed)
The patient is receiving Protonix by the intravenous route.  Based on criteria approved by the Pharmacy and [place], the medication is being converted to the equivalent oral dose form.  These criteria include: -No active GI bleeding -Able to tolerate diet of full liquids (or better) or tube feeding -Able to tolerate other medications by the oral or enteral route  If you have any questions about this conversion, please contact the Pharmacy Department (phone [phone]).  Thank you.   Minda Ditto PharmD Pager [phone] 06/21/2019, 8:24 AM

## 2019-06-21 NOTE — Progress Notes (Signed)
Dr Denton Brick paged to make him aware of patient tolerating lunch and wants to go home.  Donne Hazel, RN

## 2019-06-22 ENCOUNTER — Encounter: Payer: Self-pay | Admitting: Internal Medicine

## 2019-06-22 IMAGING — CT CT HEAD W/O CM
3 series · 16 of 46 positions shown, 19 images · non-contrast
Comparison: MR 10/26/2017, CT 01/09/2012

CLINICAL DATA: 67-year-old female with a history of nausea and
vomiting

EXAM:
CT HEAD WITHOUT CONTRAST
TECHNIQUE: Contiguous axial images were obtained from the base of the skull
through the vertex without intravenous contrast.

[Series 2: head wo · axial · 0.40mm/px · z∈[+1510,+1630]mm · 10 of 29 slices shown, 13 images]
[im 3/29  brain]
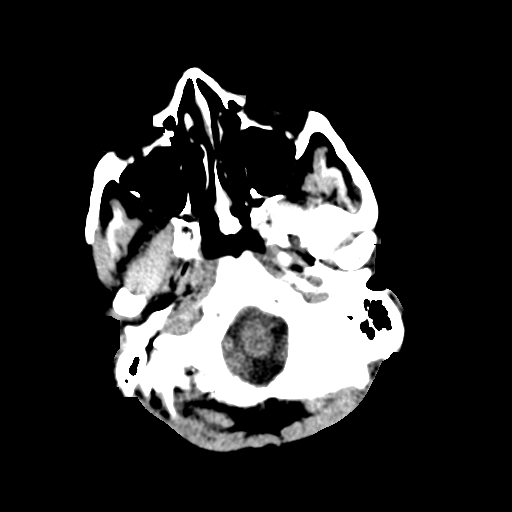
[im 3/29  bone]
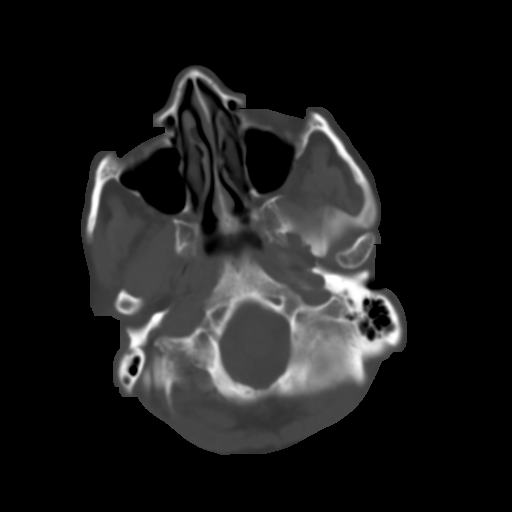
[im 6/29  brain]
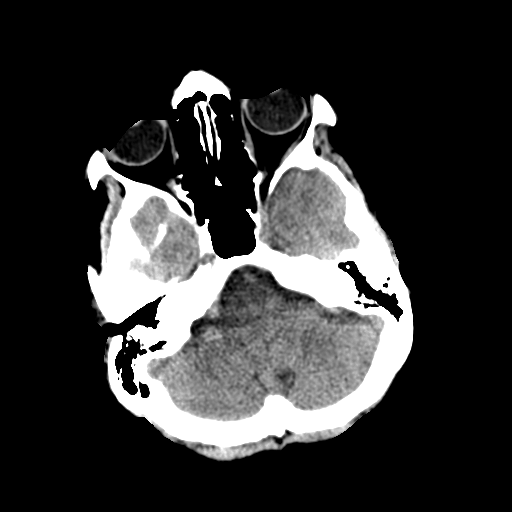
[im 8/29  brain]
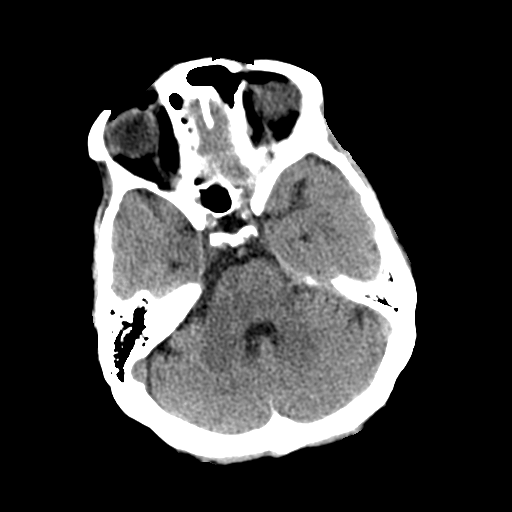
[im 11/29  brain]
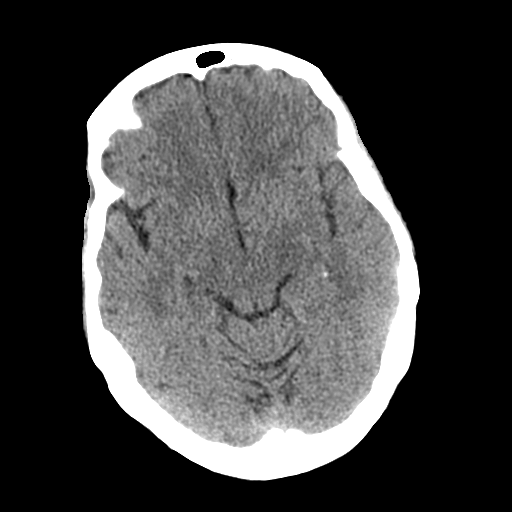
[im 14/29  brain]
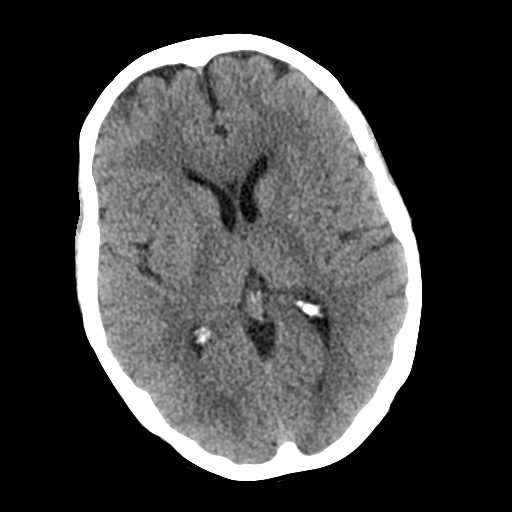
[im 14/29  bone]
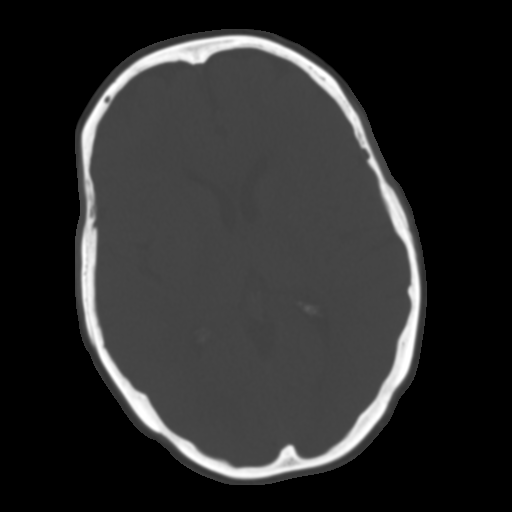
[im 16/29  brain]
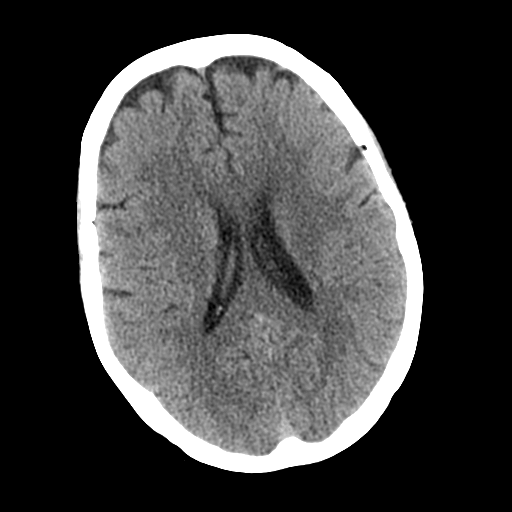
[im 19/29  brain]
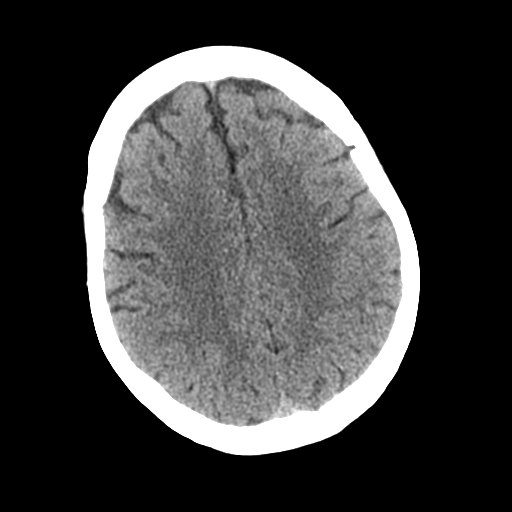
[im 22/29  brain]
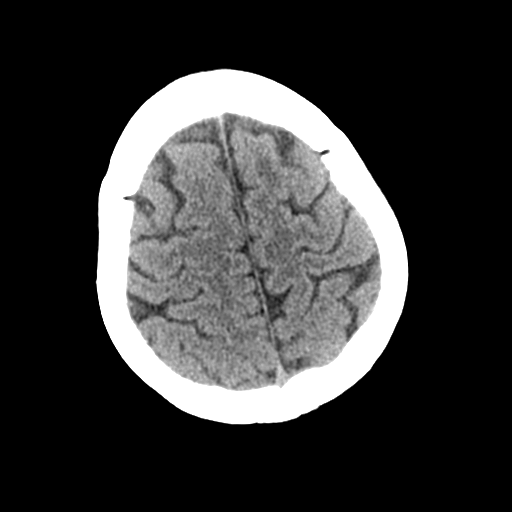
[im 24/29  brain]
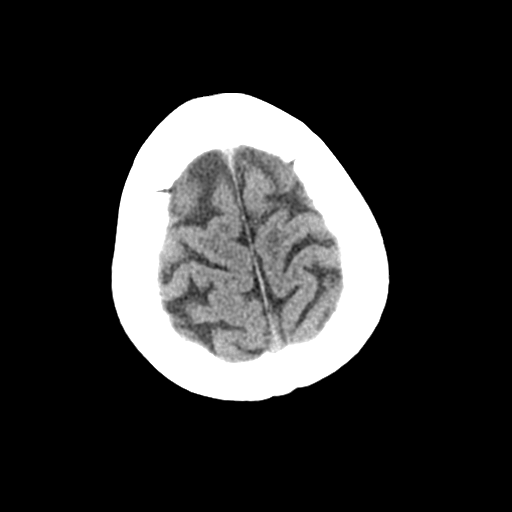
[im 24/29  bone]
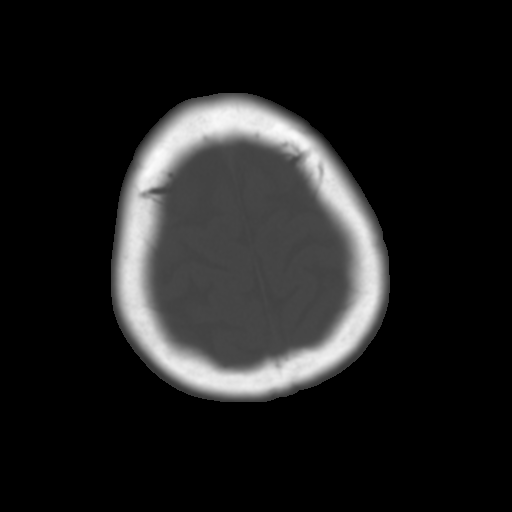
[im 27/29  brain]
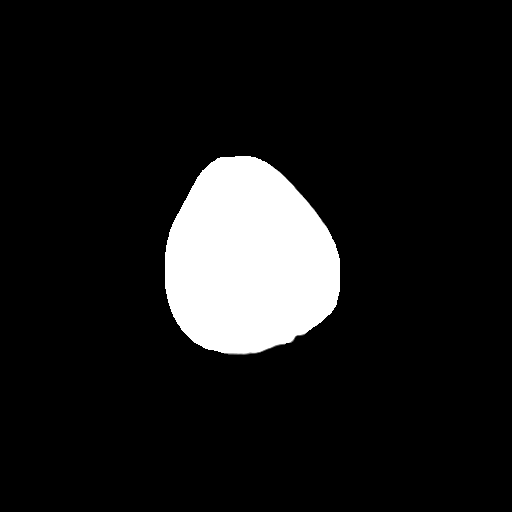

[Series 5: coronal soft tissue · coronal · 0.29mm/px · 3 of 62 slices shown]
[im 21/62  brain]
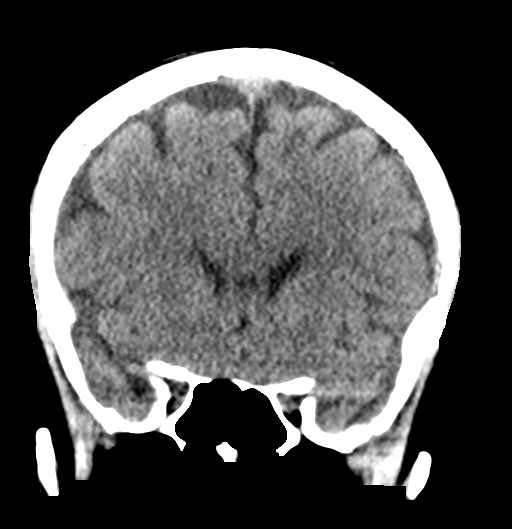
[im 28/62  brain]
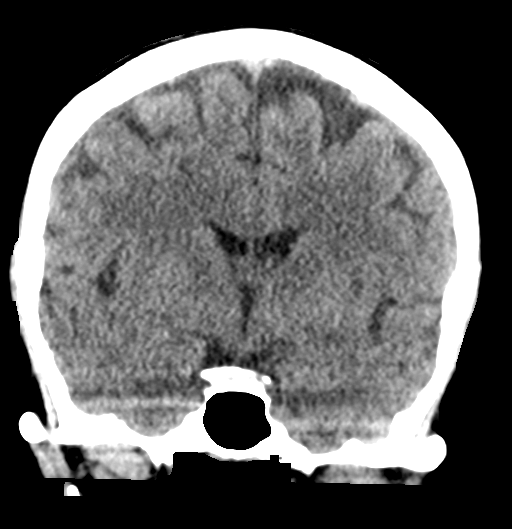
[im 34/62  brain]
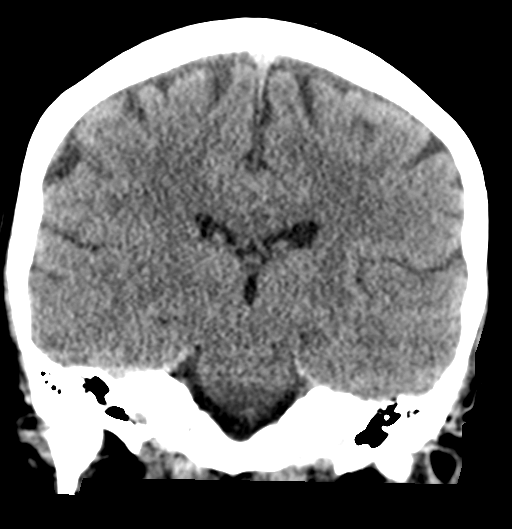

[Series 6: sagittal soft tissue · sagittal · 0.30mm/px · 3 of 48 slices shown]
[im 16/48  brain]
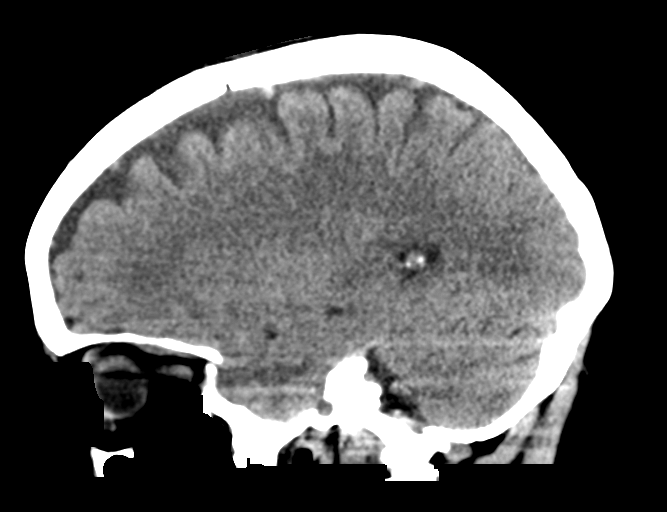
[im 24/48  brain]
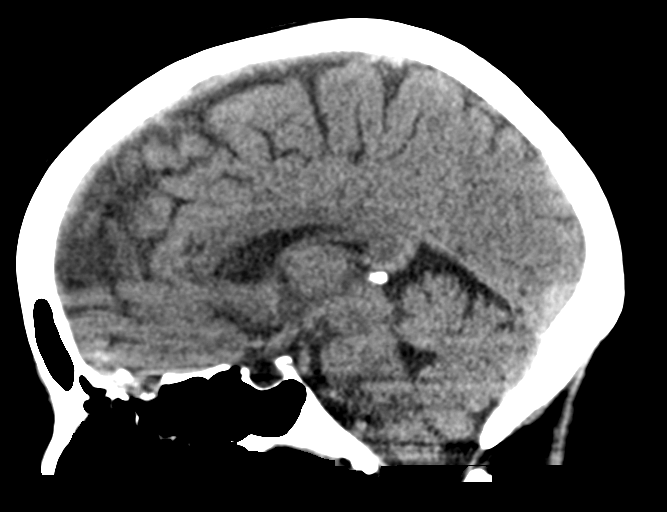
[im 32/48  brain]
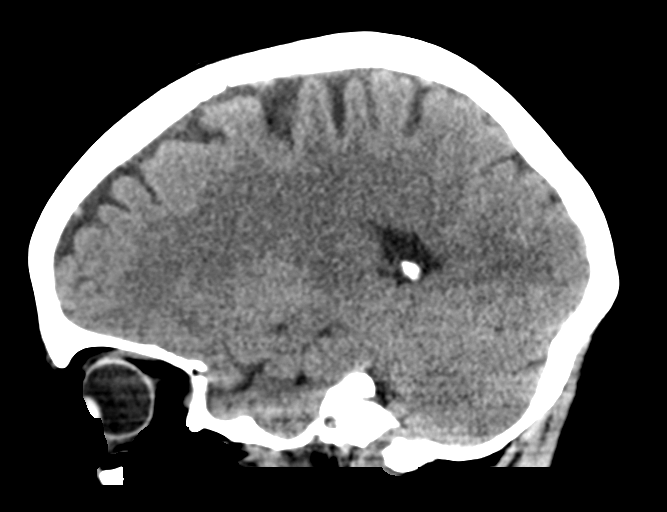

[16 of 46 positions shown; findings below may reference images not displayed]

FINDINGS: Brain: No acute intracranial hemorrhage. No midline shift or mass
effect. Gray-white differentiation maintained. Unremarkable
appearance of the ventricular system.

Vascular: Unremarkable.

Skull: No acute fracture.  No aggressive bone lesion identified.

Sinuses/Orbits: Unremarkable appearance of the orbits. Mastoid air
cells clear. No middle ear effusion. No significant sinus disease.

Other: None
IMPRESSION: Negative head CT

## 2019-06-22 IMAGING — CT CT ABD-PELV W/ CM
2 of 5 series · 16 of 46 positions shown, 18 images · IV contrast (ISOVUE)
Comparison: CT scan of March 03, 2018.

CLINICAL DATA: Acute generalized abdominal pain. Status post
ileostomy placement for colon cancer.

EXAM:
CT ABDOMEN AND PELVIS WITH CONTRAST
TECHNIQUE: Multidetector CT imaging of the abdomen and pelvis was performed
using the standard protocol following bolus administration of
intravenous contrast.
CONTRAST:  100mL [id number] IOPAMIDOL ([id number]) INJECTION 61%

[Series 2: axial st · axial · 0.68mm/px · z∈[+923,+1258]mm · 13 of 79 slices shown, 15 images]
[im 6/79  soft-tissue]
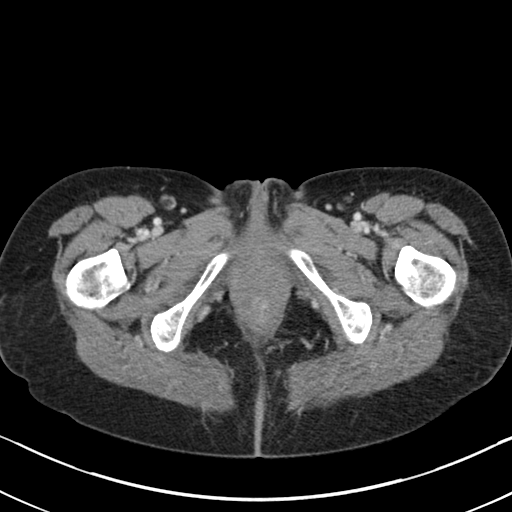
[im 6/79  bone]
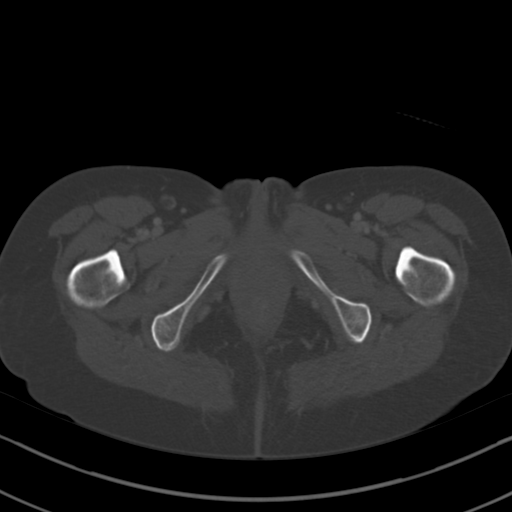
[im 11/79  soft-tissue]
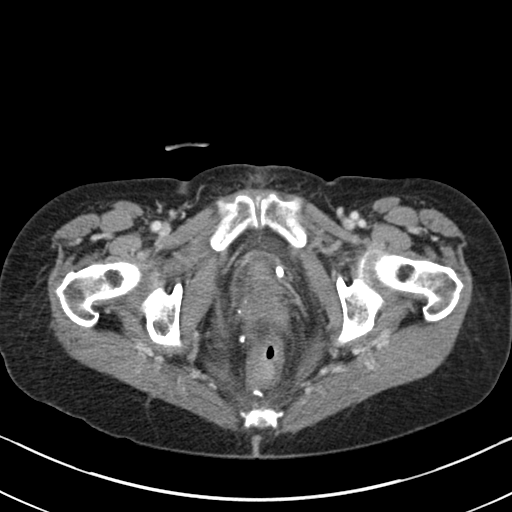
[im 16/79  soft-tissue]
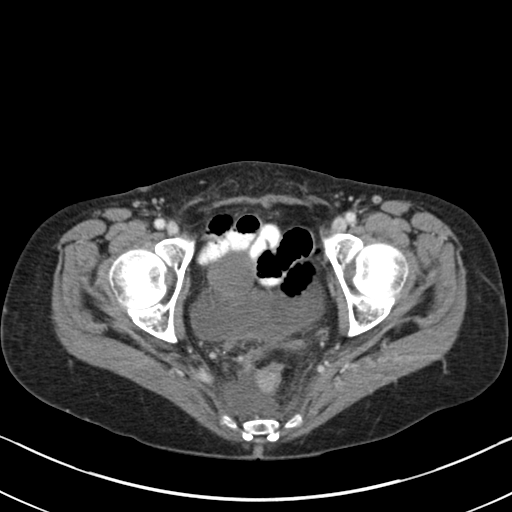
[im 21/79  soft-tissue]
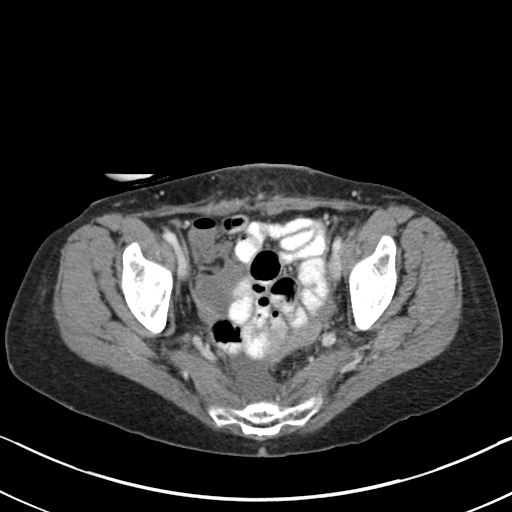
[im 27/79  soft-tissue]
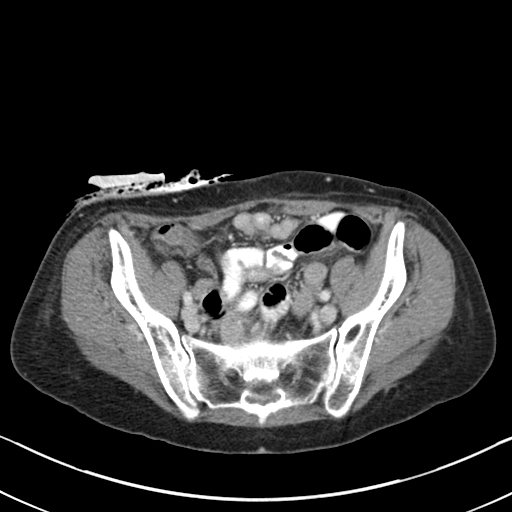
[im 32/79  soft-tissue]
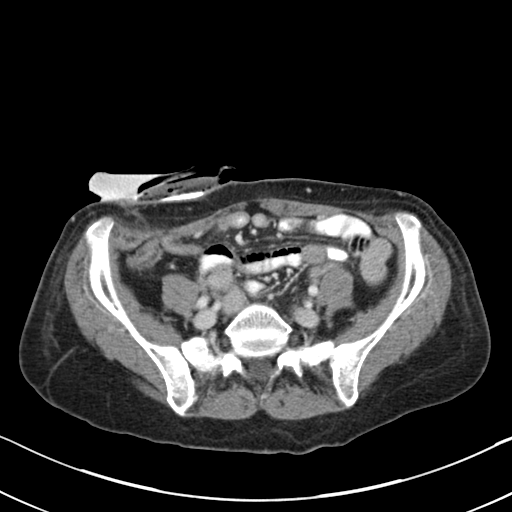
[im 42/79  soft-tissue]
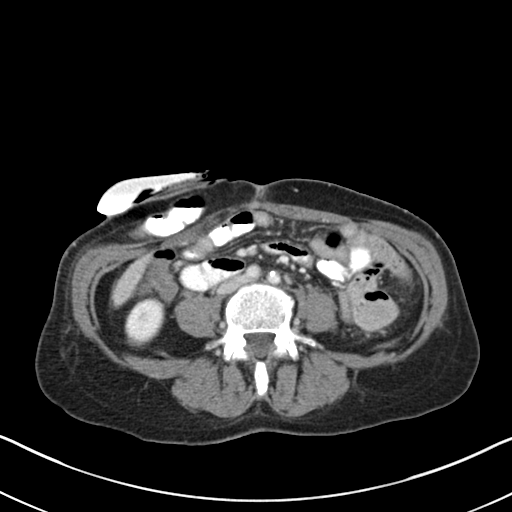
[im 47/79  soft-tissue]
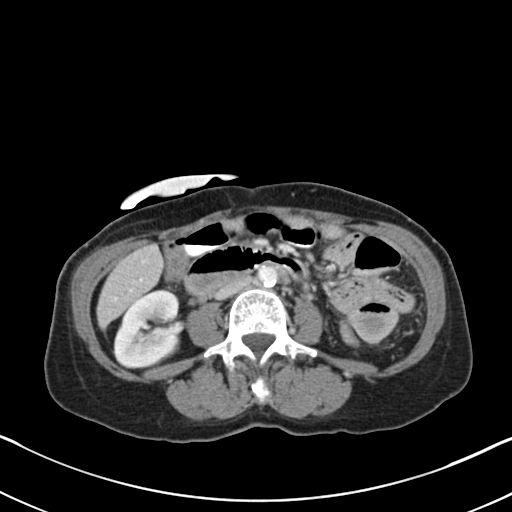
[im 53/79  soft-tissue]
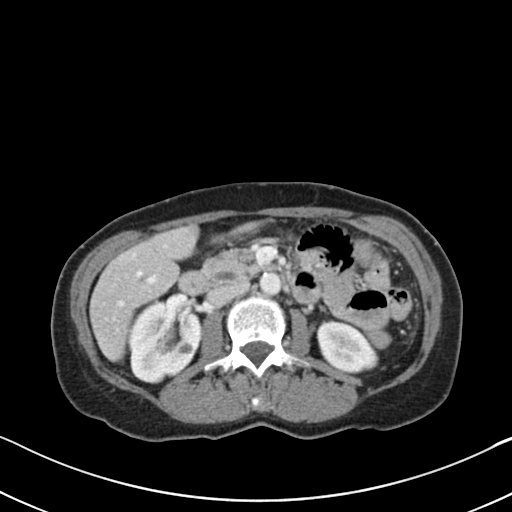
[im 53/79  bone]
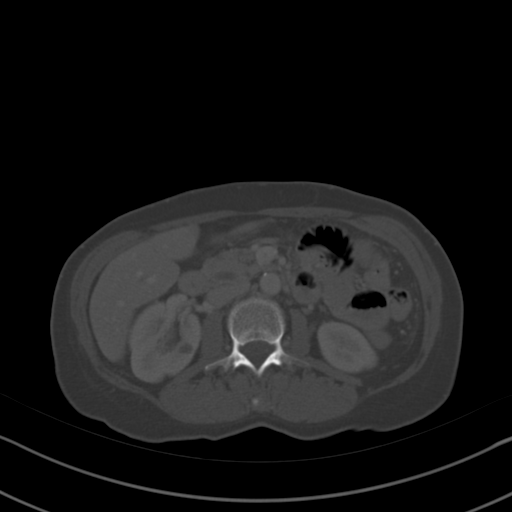
[im 58/79  soft-tissue]
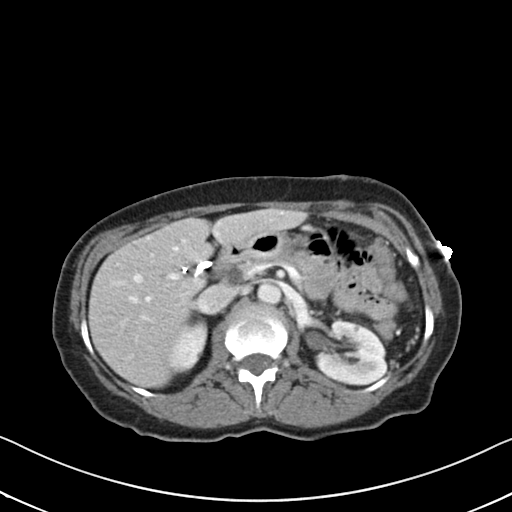
[im 63/79  soft-tissue]
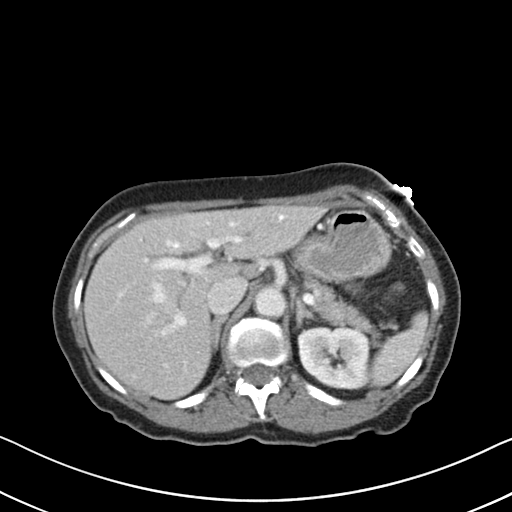
[im 68/79  soft-tissue]
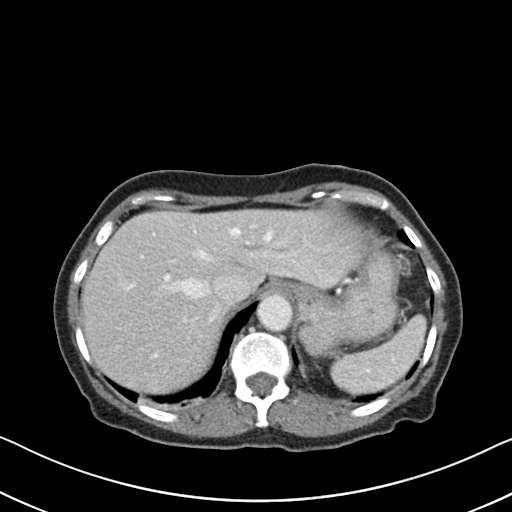
[im 73/79  soft-tissue]
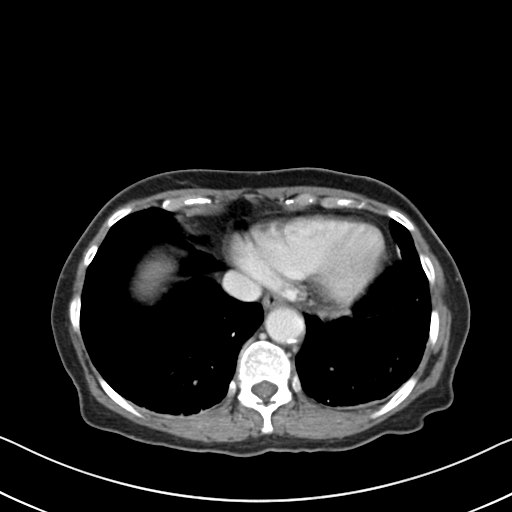

[Series 5: coronal st · coronal · 0.69mm/px · 3 of 62 slices shown]
[im 21/62  soft-tissue]
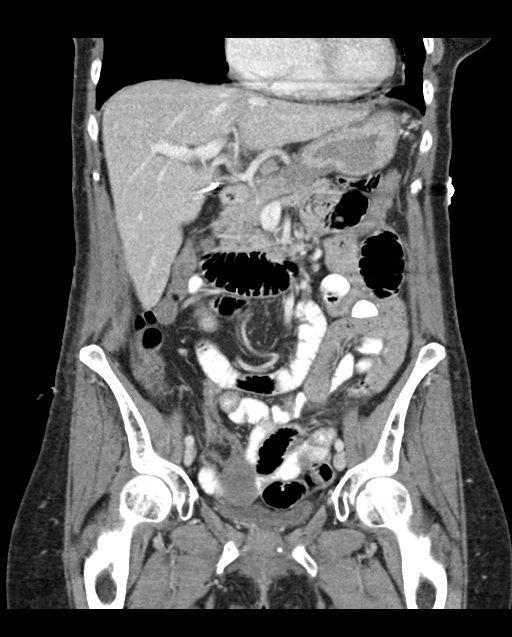
[im 28/62  soft-tissue]
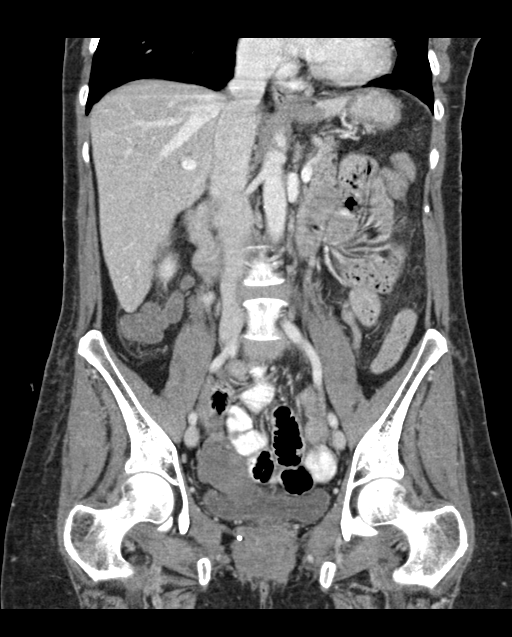
[im 34/62  soft-tissue]
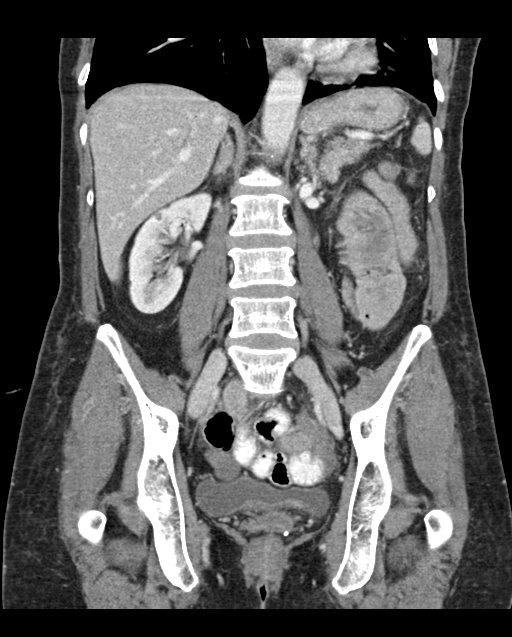

[16 of 46 positions shown; findings below may reference images not displayed]

FINDINGS: Lower chest: No acute abnormality.

Hepatobiliary: No focal liver abnormality is seen. Status post
cholecystectomy. No biliary dilatation.

Pancreas: Unremarkable. No pancreatic ductal dilatation or
surrounding inflammatory changes.

Spleen: Normal in size without focal abnormality.

Adrenals/Urinary Tract: Adrenal glands appear normal. Small
bilateral renal cysts are noted. No hydronephrosis or renal
obstruction is noted. No renal or ureteral calculi are noted urinary
bladder is unremarkable.

Stomach/Bowel: Stomach appears normal. Ileostomy is noted in right
lower quadrant. There is a moderate size peristomal hernia which
contains small bowel loops. Status post low anterior resection.
There is no evidence of bowel obstruction or inflammation.

Vascular/Lymphatic: Aortic atherosclerosis. No enlarged abdominal or
pelvic lymph nodes.

Reproductive: Status post hysterectomy. No adnexal masses.

Other: No abnormal fluid collection is noted.

Musculoskeletal: No acute or significant osseous findings.
IMPRESSION: Status post low anterior resection and ileostomy placement and right
lower quadrant. Moderate size peristomal hernia is noted which
contains small bowel loops, but does not result in incarceration or
obstruction.

No acute abnormality is noted in the abdomen or pelvis.

Aortic Atherosclerosis (48922-FX2.2).

## 2019-06-27 ENCOUNTER — Other Ambulatory Visit: Payer: Self-pay | Admitting: *Deleted

## 2019-06-27 NOTE — Telephone Encounter (Signed)
I would recommend we repeat CT abd/pelvis in about 7-10 days, thereafter if okay and no further episodes of obstructive symptoms, we can proceed with surveillance colonoscopy given her hx of crc Thanks

## 2019-06-27 NOTE — Telephone Encounter (Signed)
Dr Hilarie Fredrickson- Please see recent hospital notes and advise if I should call patient to reschedule procedure for near future or place recall reminder for further out.Marland KitchenMarland KitchenMarland Kitchen

## 2019-06-27 NOTE — Patient Outreach (Signed)
[name] Care Management  06/27/2019  Deborah Arroyo 10-Oct-1951 [id number]    EMMI-RED-GENERAL DISCHARGE  RED ON EMMI ALERT Day # 1 Date: 06/26/2019 Red Alert Reason:Unfilled Medications-RESOLVED   Outreach attempt #1 successful as RN spoke with pt and explained the purpose for today's call. Pt indicates she has several medications she did not need to take such as her stool softeners (reports currently she is regulated with some loose stools at times), Zofran (reports she does not need this medication at this time with no nausea). States her providers are aware and indicates there are no other needs to address at this time. Pt is overwhelmed with all the phones. RN offered to waive the ongoing calls via [facility] if this ease her anxiety with so many calls. Pt did not decline this offer. Therefore pt is aware she may received another follow up call of inquire over the next few days and RN may call her once again an inquire if there is any issues that may flag as a RED alert ( pt verbalized an understanding).   Plan: RN will close this EMMI as resolved with no further issues at this time reported by pt.  Raina Mina, RN Care Management Coordinator [employer] Office [phone]

## 2019-06-28 NOTE — Addendum Note (Signed)
Addended by: Larina Bras on: 06/28/2019 09:54 AM   Modules accepted: Orders

## 2019-06-28 NOTE — Telephone Encounter (Signed)
I have spoken to patient to given instructions for CT abd/pelvis. I have given her date/time/location/prep for CT and she verbalizes understanding. In addition, I have moved her previsit to 07/05/2019 so she can pick up her CT contrast and written instructions on the same date as she has some financial/gas restrictions and cannot come multiple times to our office.   I will give her written instructions and CT contrast at her previsit appointment.

## 2019-06-28 NOTE — Telephone Encounter (Signed)
Patient is scheduled for CT abd/pelvis at [facility] CT on Friday, 07/06/19 at 4:00 pm with 3:45 pm arrival. She will need to pick up contrast.   She is already rescheduled currently for colonoscopy on 07/19/19.  I have left a voicemail with patient's son for patient to call back.

## 2019-07-02 ENCOUNTER — Telehealth: Payer: Self-pay | Admitting: *Deleted

## 2019-07-02 NOTE — Telephone Encounter (Signed)
Pt.was admitted to hospital on 06/19/19,she has procedure scheduled for 07/19/19 is she ok to be done in [facility]

## 2019-07-02 NOTE — Telephone Encounter (Signed)
Deborah Arroyo,  This pt is cleared for anesthetic care at [facility].  Thanks,  Devyon Keator 

## 2019-07-02 NOTE — Telephone Encounter (Signed)
Patient okay for [facility] procedure

## 2019-07-05 ENCOUNTER — Other Ambulatory Visit: Payer: Self-pay

## 2019-07-05 ENCOUNTER — Ambulatory Visit (AMBULATORY_SURGERY_CENTER): Payer: Self-pay | Admitting: *Deleted

## 2019-07-05 ENCOUNTER — Encounter: Payer: Medicare Other | Admitting: Internal Medicine

## 2019-07-05 VITALS — Temp 97.5°F | Ht 61.0 in | Wt 100.0 lb

## 2019-07-05 DIAGNOSIS — Z85038 Personal history of other malignant neoplasm of large intestine: Secondary | ICD-10-CM

## 2019-07-05 MED ORDER — SUPREP BOWEL PREP KIT 17.5-3.13-1.6 GM/177ML PO SOLN: 1.0000 | Freq: Once | ORAL | 0 refills | Status: AC

## 2019-07-05 MED FILL — SUPREP BOWEL PREP KIT: 17.5-3.13-1 | 2 days supply | Qty: 354 | Fill #0

## 2019-07-05 NOTE — Progress Notes (Signed)
No egg or soy allergy known to patient  No issues with past sedation with any surgeries  or procedures, no intubation problems  No diet pills per patient No home 02 use per patient  No blood thinners per patient  Pt denies issues with constipation  No A fib or A flutter   

## 2019-07-06 ENCOUNTER — Other Ambulatory Visit: Payer: Self-pay

## 2019-07-06 ENCOUNTER — Ambulatory Visit (INDEPENDENT_AMBULATORY_CARE_PROVIDER_SITE_OTHER)
Admission: RE | Admit: 2019-07-06 | Discharge: 2019-07-06 | Disposition: A | Discharge status: Home / Self Care | Payer: Medicare Other | Source: Ambulatory Visit | Attending: Internal Medicine | Admitting: Internal Medicine

## 2019-07-06 DIAGNOSIS — R109 Unspecified abdominal pain: Secondary | ICD-10-CM

## 2019-07-06 DIAGNOSIS — C20 Malignant neoplasm of rectum: Secondary | ICD-10-CM

## 2019-07-06 MED ORDER — IOHEXOL 300 MG/ML  SOLN
80.0000 mL | Freq: Once | INTRAMUSCULAR | Status: AC | PRN
  Administered 2019-07-06: 80 mL via INTRAVENOUS

## 2019-07-18 ENCOUNTER — Telehealth: Payer: Self-pay

## 2019-07-18 NOTE — Telephone Encounter (Signed)
Covid-19 screening questions   Do you now or have you had a fever in the last 14 days? NO   Do you have any respiratory symptoms of shortness of breath or cough now or in the last 14 days? NO  Do you have any family members or close contacts with diagnosed or suspected Covid-19 in the past 14 days? NO  Have you been tested for Covid-19 and found to be positive? NO        

## 2019-07-19 ENCOUNTER — Encounter: Payer: Self-pay | Admitting: Internal Medicine

## 2019-07-19 ENCOUNTER — Ambulatory Visit (AMBULATORY_SURGERY_CENTER): Payer: Medicare Other | Admitting: Internal Medicine

## 2019-07-19 ENCOUNTER — Other Ambulatory Visit: Payer: Self-pay | Admitting: Internal Medicine

## 2019-07-19 ENCOUNTER — Other Ambulatory Visit: Payer: Self-pay

## 2019-07-19 VITALS — BP 121/71 | HR 72 | Temp 98.2°F | Resp 17 | Ht 61.0 in | Wt 100.0 lb

## 2019-07-19 DIAGNOSIS — R197 Diarrhea, unspecified: Secondary | ICD-10-CM

## 2019-07-19 DIAGNOSIS — D122 Benign neoplasm of ascending colon: Secondary | ICD-10-CM

## 2019-07-19 DIAGNOSIS — Z85038 Personal history of other malignant neoplasm of large intestine: Secondary | ICD-10-CM | POA: Diagnosis not present

## 2019-07-19 DIAGNOSIS — Z85048 Personal history of other malignant neoplasm of rectum, rectosigmoid junction, and anus: Secondary | ICD-10-CM | POA: Diagnosis not present

## 2019-07-19 DIAGNOSIS — K635 Polyp of colon: Secondary | ICD-10-CM

## 2019-07-19 MED ORDER — CHOLESTYRAMINE 4 G PO PACK: 4.0000 g | PACK | Freq: Every day | ORAL | 2 refills | Status: DC

## 2019-07-19 MED ORDER — SODIUM CHLORIDE 0.9 % IV SOLN: 500.0000 mL | Freq: Once | INTRAVENOUS | Status: DC

## 2019-07-19 MED FILL — CHOLESTYRAMINE PACKET: 4 | 60 days supply | Qty: 60 | Fill #0

## 2019-07-19 NOTE — Progress Notes (Signed)
Called to room to assist during endoscopic procedure.  Patient ID and intended procedure confirmed with present staff. Received instructions for my participation in the procedure from the performing physician.  

## 2019-07-19 NOTE — Patient Instructions (Signed)
Discharge instructions given. Prescription sent to pharmacy. Resume previous medications. YOU HAD AN ENDOSCOPIC PROCEDURE TODAY AT [facility]:   Refer to the procedure report that was given to you for any specific questions about what was found during the examination.  If the procedure report does not answer your questions, please call your gastroenterologist to clarify.  If you requested that your care partner not be given the details of your procedure findings, then the procedure report has been included in a sealed envelope for you to review at your convenience later.  YOU SHOULD EXPECT: Some feelings of bloating in the abdomen. Passage of more gas than usual.  Walking can help get rid of the air that was put into your GI tract during the procedure and reduce the bloating. If you had a lower endoscopy (such as a colonoscopy or flexible sigmoidoscopy) you may notice spotting of blood in your stool or on the toilet paper. If you underwent a bowel prep for your procedure, you may not have a normal bowel movement for a few days.  Please Note:  You might notice some irritation and congestion in your nose or some drainage.  This is from the oxygen used during your procedure.  There is no need for concern and it should clear up in a day or so.  SYMPTOMS TO REPORT IMMEDIATELY:   Following lower endoscopy (colonoscopy or flexible sigmoidoscopy):  Excessive amounts of blood in the stool  Significant tenderness or worsening of abdominal pains  Swelling of the abdomen that is new, acute  Fever of 100F or higher   For urgent or emergent issues, a gastroenterologist can be reached at any hour by calling [phone].   DIET:  We do recommend a small meal at first, but then you may proceed to your regular diet.  Drink plenty of fluids but you should avoid alcoholic beverages for 24 hours.  ACTIVITY:  You should plan to take it easy for the rest of today and you should NOT DRIVE or  use heavy machinery until tomorrow (because of the sedation medicines used during the test).    FOLLOW UP: Our staff will call the number listed on your records 48-72 hours following your procedure to check on you and address any questions or concerns that you may have regarding the information given to you following your procedure. If we do not reach you, we will leave a message.  We will attempt to reach you two times.  During this call, we will ask if you have developed any symptoms of COVID 19. If you develop any symptoms (ie: fever, flu-like symptoms, shortness of breath, cough etc.) before then, please call [phone].  If you test positive for Covid 19 in the 2 weeks post procedure, please call and report this information to [place].    If any biopsies were taken you will be contacted by phone or by letter within the next 1-3 weeks.  Please call us at [phone] if you have not heard about the biopsies in 3 weeks.    SIGNATURES/CONFIDENTIALITY: You and/or your care partner have signed paperwork which will be entered into your electronic medical record.  These signatures attest to the fact that that the information above on your After Visit Summary has been reviewed and is understood.  Full responsibility of the confidentiality of this discharge information lies with you and/or your care-partner.

## 2019-07-19 NOTE — Op Note (Signed)
[place] Patient Name: Deborah Arroyo Procedure Date: 07/19/2019 11:11 AM MRN: [id number] Endoscopist: Jerene Bears , MD Age: 68 Referring MD:  Date of Birth: 1951/07/04 Gender: Female Account #: [id number] Procedure:                Colonoscopy Indications:              High risk colon cancer surveillance: Personal                            history of rectal cancer s/p LAR with diverting                            loop ileostomy s/p takedown in June 2020, personal                            history of adenomatous polyps, last colonoscopy                            Sept 2020, incidental diarrhea after anastomosis Medicines:                Monitored Anesthesia Care Procedure:                Pre-Anesthesia Assessment:                           - Prior to the procedure, a History and Physical                            was performed, and patient medications and                            allergies were reviewed. The patient's tolerance of                            previous anesthesia was also reviewed. The risks                            and benefits of the procedure and the sedation                            options and risks were discussed with the patient.                            All questions were answered, and informed consent                            was obtained. Prior Anticoagulants: The patient has                            taken no previous anticoagulant or antiplatelet                            agents. ASA Grade Assessment: III - A patient with  severe systemic disease. After reviewing the risks                            and benefits, the patient was deemed in                            satisfactory condition to undergo the procedure.                           After obtaining informed consent, the colonoscope                            was passed under direct vision. Throughout the                            procedure, the  patient's blood pressure, pulse, and                            oxygen saturations were monitored continuously. The                            Colonoscope was introduced through the anus and                            advanced to the cecum, identified by appendiceal                            orifice and ileocecal valve. The colonoscopy was                            performed without difficulty. The patient tolerated                            the procedure well. The quality of the bowel                            preparation was good. The ileocecal valve,                            appendiceal orifice, and rectum were photographed. Scope In: 11:19:49 AM Scope Out: 11:29:56 AM Scope Withdrawal Time: 0 hours 7 minutes 56 seconds  Total Procedure Duration: 0 hours 10 minutes 7 seconds  Findings:                 The perianal exam findings include a perianal                            fungal rash.                           The digital rectal exam was normal.                           A 2 mm polyp was found in the ascending colon. The  polyp was sessile. The polyp was removed with a                            cold biopsy forceps. Resection and retrieval were                            complete.                           There was evidence of a prior end-to-end                            colo-colonic anastomosis in the proximal rectum.                            This was patent and was characterized by healthy                            appearing mucosa. The anastomosis was traversed.                           The exam was otherwise without abnormality.                           Biopsies for histology were taken with a cold                            forceps from the right colon and left colon for                            evaluation of microscopic colitis. Complications:            No immediate complications. Estimated Blood Loss:     Estimated blood loss was  minimal. Impression:               - Perianal fungal rash found on perianal exam.                           - One 2 mm polyp in the ascending colon, removed                            with a cold biopsy forceps. Resected and retrieved.                           - Patent end-to-end colo-colonic anastomosis,                            characterized by healthy appearing mucosa.                           - The examination was otherwise normal.                           - Biopsies were taken with a cold forceps from the  right colon and left colon for evaluation of                            microscopic colitis. Recommendation:           - Patient has a contact number available for                            emergencies. The signs and symptoms of potential                            delayed complications were discussed with the                            patient. Return to normal activities tomorrow.                            Written discharge instructions were provided to the                            patient.                           - Resume previous diet.                           - Continue present medications.                           - Begin cholestyramine 4 g daily for loose stools                            and diarrhea. Use clotrimazole cream BID to                            perianal skin x 1-2 weeks until burning perianal                            pain better.                           - Can followup with me in 2 months (or so) to                            ensure diarrhea is better.                           - Await pathology results.                           - Repeat colonoscopy is recommended for                            surveillance in 3 years. The colonoscopy date will  be confirmed after pathology results from today's                            exam become available for review. Jerene Bears, MD 07/19/2019 11:44:55  AM This report has been signed electronically.

## 2019-07-19 NOTE — Progress Notes (Addendum)
**Note Deborah-Identified via Obfuscation** Deborah Arroyo reported she ate 1/2 bag of popcorn yesterday at 11:30.  She also reported er last BM was clear liquid. Maw   Pt's states no medical or surgical changes since previsit or office visit.maw

## 2019-07-19 NOTE — Progress Notes (Signed)
Report to PACU, RN, vss, BBS= Clear.  

## 2019-07-23 ENCOUNTER — Telehealth: Payer: Self-pay

## 2019-07-23 NOTE — Telephone Encounter (Signed)
Second post procedure follow up call, no answer 

## 2019-07-23 NOTE — Telephone Encounter (Signed)
First post procedure follow up call, no answer 

## 2019-07-25 ENCOUNTER — Encounter: Payer: Self-pay | Admitting: Internal Medicine

## 2019-08-06 ENCOUNTER — Encounter: Payer: Medicare Other | Admitting: Internal Medicine

## 2019-08-29 ENCOUNTER — Telehealth: Payer: Self-pay | Admitting: Internal Medicine

## 2019-08-29 NOTE — Telephone Encounter (Signed)
Pt called to schedule a FU appt for hx of rectal cancer.  Offered only available appt in December.  Pt refused and requested to be seen in November.

## 2019-08-29 NOTE — Telephone Encounter (Signed)
Spoke with pt and she states she was to follow-up regarding diarrhea and her perianal skin. Pt reports she is still having soreness and raw areas on her bottom. Pt scheduled to see Dr. Hilarie Fredrickson 09/18/19@9 :30am. Pt aware of appt.

## 2019-09-10 ENCOUNTER — Encounter: Payer: Self-pay | Admitting: *Deleted

## 2019-09-18 ENCOUNTER — Encounter: Payer: Self-pay | Admitting: Internal Medicine

## 2019-09-18 ENCOUNTER — Ambulatory Visit (INDEPENDENT_AMBULATORY_CARE_PROVIDER_SITE_OTHER): Payer: Medicare Other | Admitting: Internal Medicine

## 2019-09-18 ENCOUNTER — Other Ambulatory Visit: Payer: Self-pay

## 2019-09-18 VITALS — BP 120/78 | HR 80 | Temp 95.5°F | Ht 61.0 in | Wt 102.6 lb

## 2019-09-18 DIAGNOSIS — L309 Dermatitis, unspecified: Secondary | ICD-10-CM | POA: Diagnosis not present

## 2019-09-18 DIAGNOSIS — K529 Noninfective gastroenteritis and colitis, unspecified: Secondary | ICD-10-CM

## 2019-09-18 DIAGNOSIS — Z85048 Personal history of other malignant neoplasm of rectum, rectosigmoid junction, and anus: Secondary | ICD-10-CM | POA: Diagnosis not present

## 2019-09-18 MED ORDER — CLOTRIMAZOLE-BETAMETHASONE 1-0.05 % EX CREA: 1.0000 "application " | TOPICAL_CREAM | Freq: Two times a day (BID) | CUTANEOUS | 1 refills | Status: DC

## 2019-09-18 MED ORDER — DIPHENOXYLATE-ATROPINE 2.5-0.025 MG PO TABS: ORAL_TABLET | ORAL | 3 refills | Status: DC

## 2019-09-18 MED FILL — DIPHENOXYLATE-ATROPINE 2.5-: 2.5-0.025 | 30 days supply | Qty: 180 | Fill #0

## 2019-09-18 NOTE — Patient Instructions (Signed)
We have sent the following medications to your pharmacy for you to pick up at your convenience: lotrisone cream-Apply to rectum twice daily Lomotil- take 2 tablets every morning, 2 tablets every night. May take 1-2 additional tablets throughout the day if needed.  Please call our office in 2-3 weeks with an update on how you are doing.  If you are age 69 or older, your body mass index should be between 23-30. Your Body mass index is 19.39 kg/m. If this is out of the aforementioned range listed, please consider follow up with your Primary Care Provider.  If you are age 70 or younger, your body mass index should be between 19-25. Your Body mass index is 19.39 kg/m. If this is out of the aformentioned range listed, please consider follow up with your Primary Care Provider.

## 2019-09-18 NOTE — Progress Notes (Signed)
   Subjective:    Patient ID: Deborah Arroyo, female    DOB: 01/30/1951, 68 y.o.   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  She is here alone today.  I saw her recently for surveillance colonoscopy performed on 07/19/2019.  This revealed a perianal fungal rash.  A 2 mm a sending polyp found to be a sessile serrated polyp, and an end-to-end colocolonic anastomosis in the proximal rectum.  Biopsies were negative for microscopic colitis.  After this procedure I started her on Questran for frequent loose stools worse after eating.  She tried this but found it to cause severe nausea so it was discontinued.  She continues to have 3-4 loose stools a day including waking her from sleep on occasion.  After bowel movement she will occasionally have fecal smearing.  She continues to have perianal itching burning and pain after bowel movement.  She questions if her stool has "acid in it".  Appetite is good.  No upper GI or hepatobiliary complaint.  She did try Lotrisone which helped but when it was discontinued her rash and discomfort came back.  She also tried Vaseline, Desitin and triple antibiotic ointment.  Review of Systems As per HPI, otherwise negative  Current Medications, Allergies, Past Medical History, Past Surgical History, Family History and Social History were reviewed in [employer] record.     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   1.  History of rectal cancer --she is in remission and up-to-date with recent surveillance colonoscopy.  Her next colonoscopy will be in 3 years.  2.  Frequent loose stool/diarrhea --secondary to her anatomy with the loss of much of her rectum.  Cholestyramine was not tolerated due to nausea. --Trial of Lomotil 2 tablets in the morning, 2 tablets at bedtime.  She can take 2 tablets additionally in midday if needed.  She is asked to try this for several weeks and let me know if it is effective.  3.  Perianal irritation/rash --secondary to her loose stools which are difficult for her to control after having rectal cancer.  We will have her resume Lotrisone twice daily for several weeks.  Hopefully improving #2 and solidifying stool will prevent recurrence of her perianal dermatitis.  If not effective then I will have her see dermatology  25 minutes spent with the patient today. Greater than 50% was spent in counseling and coordination of care with the patient

## 2019-10-15 ENCOUNTER — Telehealth: Payer: Self-pay | Admitting: Internal Medicine

## 2019-10-15 NOTE — Telephone Encounter (Signed)
Spoke to the patient who reported per your instruction, the Lomotil has stopped her diarrhea. She stated that taking 2 in the morning and at night and 1 or 2 throughout the day caused her to be nauseous and sleeping so she decreased the dose to one during the day and one at night. She wanted to let you know she was feeling much better.

## 2019-11-15 ENCOUNTER — Inpatient Hospital Stay: Payer: Medicare Other

## 2019-11-15 ENCOUNTER — Telehealth: Payer: Self-pay | Admitting: Oncology

## 2019-11-15 ENCOUNTER — Telehealth: Payer: Self-pay

## 2019-11-15 ENCOUNTER — Inpatient Hospital Stay: Payer: Medicare Other | Admitting: Nurse Practitioner

## 2019-11-15 NOTE — Telephone Encounter (Signed)
Received phone call from patient stating that she did not want to drive to [facility] today and would like to either do a phone visit or cancel appointments. I let patient know that Ned Card NP can do a phone visit with her next Friday 11/22/18. Patient verbalized next Friday would work for her as well. Sent schedule message for schedulers to schedule patient for phone visit 11/23/19 and to call patient and let her know time of phone appointment.

## 2019-11-15 NOTE — Telephone Encounter (Signed)
Scheduled appt per 1/14 sch message - pt aware of appt date and time

## 2019-11-19 ENCOUNTER — Telehealth: Payer: Self-pay | Admitting: Internal Medicine

## 2019-11-19 NOTE — Telephone Encounter (Signed)
Pt states she has not been having loose stools and wondered if she could stop her lomotil. Discussed with pt that she can try stopping them and see what happens. Pt states she only has loose stools once and a while so she will try to stop them. Pt knows to call back with any other questions or concerns.

## 2019-11-23 ENCOUNTER — Inpatient Hospital Stay: Payer: Medicare Other | Attending: Nurse Practitioner | Admitting: Nurse Practitioner

## 2019-11-23 ENCOUNTER — Encounter: Payer: Self-pay | Admitting: Nurse Practitioner

## 2019-11-23 DIAGNOSIS — C20 Malignant neoplasm of rectum: Secondary | ICD-10-CM

## 2019-11-23 NOTE — Progress Notes (Signed)
[facility] [facility] OFFICE VISIT PROGRESS NOTE  I connected with Deborah Arroyo 11/23/19 at 12:15 PM EST by telephone and verified that I am speaking with the correct person using two identifiers.   I discussed the limitations, risks, security and privacy concerns of performing an evaluation and management service by telemedicine and the availability of in-person appointments. I also discussed with the patient that there may be a patient responsible charge related to this service. The patient expressed understanding and agreed to proceed.  Other persons participating in the visit and their role in the encounter:  None  Patient's location:  Home  Provider's location:  Office  Diagnosis: Rectal cancer  INTERVAL HISTORY:   Deborah Arroyo is contacted for a telehealth visit.  She reports that overall she is doing well.  She is having less fecal incontinence.  She takes 1 Lomotil tablet each morning and afternoon.  She estimates 1-3 bowel movements per day with a consistency ranging from soft to solid.  No blood or pain with bowel movements.  Appetite is unchanged.  She reports her weight is stable.  Objective:  Lab Results:  Lab Results  Component Value Date   WBC 14.3 (H) 06/20/2019   HGB 13.7 06/20/2019   HCT 41.2 06/20/2019   MCV 95.4 06/20/2019   PLT 330 06/20/2019   NEUTROABS 8.1 (H) 04/23/2019    Imaging:  No results found.  Medications: I have reviewed the patient's current medications.  Assessment/Plan: 1. Rectal cancer, obstructing rectal mass on colonoscopy 03/01/2018-biopsy confirmed adenocarcinoma ? Incomplete colonoscopy secondary to obstructing tumor ? Staging CTs on 03/03/2018-small mediastinal/hilar lymph nodes, indeterminate lingula nodule, rectal mass, small perirectal lymph nodes, mass measured at approximately 4.9 cm from the anal verge ? MRI pelvis 03/11/2018-T3c,N2 ? Radiation/Xeloda 04/03/2018-05/11/2018 ? Colonoscopy  07/04/2018- mass at 8 cm from the dentate line, polypsremoved from the hepatic flexure and descending colon-tubular adenomas ? Low anterior resection/ileostomy 07/12/2018, T3N0 moderately differentiated adenocarcinoma, margins negative, 11- lymph nodes, no lymphovascular or perineural invasion, MSI-stable, no loss of mismatch repair protein expression ? Cycle 1 adjuvant Xeloda 08/07/2018 ? Cycle 2 adjuvant Xeloda 08/28/2018 ? Cycle 3 adjuvant Xeloda 09/18/2018 ? Cycle 4 adjuvant Xeloda 10/09/2018(Xeloda held 10/13/2018 pm dose, 10/14/2018 and 10/15/2018; resumed 10/16/2018 at a reduced dose of 1000 mg every morning and 500 mg every afternoon for the remainder of the cycle) ? Cycle 5 adjuvant Xeloda 10/30/2018 ? CTs 01/04/2019- low anterior resection with a right lower quadrant ileostomy, no evidence of metastatic disease ? Takedown of diverting loop ileostomy and closure of parastomal hernia 04/20/2019 ? Surveillance colonoscopy 07/19/2019-2 mm polyp in the ascending colon (sessile serrated polyp without cytologic dysplasia), next colonoscopy at a 3-year interval 2. Tobacco use 3. COPD 4. Family history of multiple cancers  Disposition: [name] has a history of stage III rectal cancer.  She completed the final cycle of adjuvant Xeloda 10/30/2018.  She is up-to-date on colonoscopy surveillance.  She agrees to come to the office for a CEA on 12/04/2018.  She will undergo CT scans prior to her next office visit which will be in 6 months.  She will contact the office in the interim with any problems.  I discussed the assessment and treatment plan with the patient. The patient was provided an opportunity to ask questions and all were answered. The patient agreed with the plan and demonstrated an understanding of the instructions.   The patient was advised to call back or seek an  in-person evaluation if the symptoms worsen or if the condition fails to improve as anticipated.  I provided 18 minutes of  telephone, chart review and documentation during this encounter, and > 50% was spent counseling as documented under my assessment & plan.  Ned Card ANP/GNP-BC   11/23/2019 12:39 PM

## 2019-11-26 ENCOUNTER — Telehealth: Payer: Self-pay | Admitting: Oncology

## 2019-11-26 NOTE — Telephone Encounter (Signed)
Scheduled per los. Mailed printout of schedule

## 2019-12-04 MED FILL — DIPHENOXYLATE-ATROPINE 2.5-: 2.5-0.025 | 30 days supply | Qty: 180 | Fill #1

## 2019-12-05 ENCOUNTER — Other Ambulatory Visit: Payer: Self-pay

## 2019-12-05 ENCOUNTER — Inpatient Hospital Stay: Payer: Medicare Other | Attending: Nurse Practitioner

## 2019-12-05 DIAGNOSIS — C20 Malignant neoplasm of rectum: Secondary | ICD-10-CM | POA: Diagnosis not present

## 2019-12-05 LAB — CEA (IN HOUSE-CHCC): CEA (CHCC-In House): 3.75 ng/mL (ref 0.00–5.00)

## 2019-12-06 ENCOUNTER — Telehealth: Payer: Self-pay

## 2019-12-06 NOTE — Telephone Encounter (Signed)
Returned TC to pt in regard to her wanting to know what her CEA level was yesterday. I let her know that CEA was 3.75 patient verbalized understanding. No further problems or concerns at this time.

## 2020-01-07 ENCOUNTER — Telehealth: Payer: Self-pay

## 2020-01-07 NOTE — Telephone Encounter (Signed)
Returned TC to patient in regard to her questions about her appointments in July. I let her know that her CT scan had not been scheduled yet and put in probably because it is so early. I also let her know that the reason why she has an appointment on 7/22 for labs and an appointment with Dr Benay Spice 7/26 is because Dr Benay Spice needs to see her after her lab CT to discuss the results which should be scheduled on the same day as her lab appointment on 7/22. Patient verbalized understanding. No further problems or concerns at this time.

## 2020-02-11 ENCOUNTER — Encounter: Payer: Medicare Other | Admitting: Internal Medicine

## 2020-02-13 ENCOUNTER — Other Ambulatory Visit: Payer: Self-pay

## 2020-02-13 ENCOUNTER — Ambulatory Visit (INDEPENDENT_AMBULATORY_CARE_PROVIDER_SITE_OTHER): Payer: Medicare Other | Admitting: Internal Medicine

## 2020-02-13 VITALS — BP 120/82 | HR 83 | Temp 97.8°F | Ht 61.0 in | Wt 108.9 lb

## 2020-02-13 DIAGNOSIS — Z7983 Long term (current) use of bisphosphonates: Secondary | ICD-10-CM

## 2020-02-13 DIAGNOSIS — F411 Generalized anxiety disorder: Secondary | ICD-10-CM

## 2020-02-13 DIAGNOSIS — M810 Age-related osteoporosis without current pathological fracture: Secondary | ICD-10-CM | POA: Diagnosis not present

## 2020-02-13 DIAGNOSIS — F17210 Nicotine dependence, cigarettes, uncomplicated: Secondary | ICD-10-CM | POA: Diagnosis not present

## 2020-02-13 DIAGNOSIS — Z720 Tobacco use: Secondary | ICD-10-CM

## 2020-02-13 DIAGNOSIS — Z79899 Other long term (current) drug therapy: Secondary | ICD-10-CM | POA: Diagnosis not present

## 2020-02-13 MED ORDER — CYCLOBENZAPRINE HCL 5 MG PO TABS: 5.0000 mg | ORAL_TABLET | Freq: Every day | ORAL | 0 refills | Status: DC

## 2020-02-13 MED ORDER — CALCIUM CARBONATE 1500 (600 CA) MG PO TABS: 600.0000 mg | ORAL_TABLET | Freq: Two times a day (BID) | ORAL | 11 refills | Status: DC

## 2020-02-13 MED ORDER — ESCITALOPRAM OXALATE 10 MG PO TABS: 10.0000 mg | ORAL_TABLET | Freq: Every day | ORAL | 1 refills | Status: DC

## 2020-02-13 MED FILL — CYCLOBENZAPRINE HCL 5 MG TA: 5 | 30 days supply | Qty: 30 | Fill #0

## 2020-02-13 MED FILL — ESCITALOPRAM 10 MG TABLET: 10 | 30 days supply | Qty: 30 | Fill #0

## 2020-02-13 NOTE — Patient Instructions (Addendum)
Ms. Lillis, It was great seeing you again! Glad things are overall going well!  Today we discussed:  Your difficulty with sleep and racing thoughts - I am referring you to our counselor, Miquel Dunn, who will give you a call to help discuss things. I am also starting you on a low-dose medication that you will take once a day. I will plan to give you a call in 4-6 weeks to see if you have noticed a difference. For sleep, I would try Melatonin 5-10 mg that you can purchase at the drug store.   Your neck/shoulder pain - seems related to muscle spasms in your neck. Try Ibuprofen, heating pad. I will give you a low-dose muscle relaxant to help at night.   Take care, and I'll see you back in 6 months!  Cervical Strain and Sprain Rehab Ask your health care provider which exercises are safe for you. Do exercises exactly as told by your health care provider and adjust them as directed. It is normal to feel mild stretching, pulling, tightness, or discomfort as you do these exercises. Stop right away if you feel sudden pain or your pain gets worse. Do not begin these exercises until told by your health care provider. Stretching and range-of-motion exercises Cervical side bending  1. Using good posture, sit on a stable chair or stand up. 2. Without moving your shoulders, slowly tilt your left / right ear to your shoulder until you feel a stretch in the opposite side neck muscles. You should be looking straight ahead. 3. Hold for 30 seconds. 4. Repeat with the other side of your neck. Repeat 2 times. Complete this exercise 2 times a day. Cervical rotation  1. Using good posture, sit on a stable chair or stand up. 2. Slowly turn your head to the side as if you are looking over your left / right shoulder. ? Keep your eyes level with the ground. ? Stop when you feel a stretch along the side and the back of your neck. 3. Hold for 30 seconds. 4. Repeat this by turning to your other side. Repeat 2 times. Complete  this exercise 2 times a day. Thoracic extension and pectoral stretch 1. Roll a towel or a small blanket so it is about 4 inches (10 cm) in diameter. 2. Lie down on your back on a firm surface. 3. Put the towel lengthwise, under your spine in the middle of your back. It should not be under your shoulder blades. The towel should line up with your spine from your middle back to your lower back. 4. Put your hands behind your head and let your elbows fall out to your sides. 5. Hold for 20 seconds. Repeat 2 times. Complete this exercise 2 times a day. Strengthening exercises Isometric upper cervical flexion 1. Lie on your back with a thin pillow behind your head and a small rolled-up towel under your neck. 2. Gently tuck your chin toward your chest and nod your head down to look toward your feet. Do not lift your head off the pillow. 3. Hold for 30 seconds. 4. Release the tension slowly. Relax your neck muscles completely before you repeat this exercise. Repeat 2 times. Complete this exercise 2 times a day. Isometric cervical extension  1. Stand about 6 inches (15 cm) away from a wall, with your back facing the wall. 2. Place a soft object, about 6-8 inches (15-20 cm) in diameter, between the back of your head and the wall. A soft object could  be a small pillow, a ball, or a folded towel. 3. Gently tilt your head back and press into the soft object. Keep your jaw and forehead relaxed. 4. Hold for 30 seconds. 5. Release the tension slowly. Relax your neck muscles completely before you repeat this exercise. Repeat 2 times. Complete this exercise 2 times a day. Posture and body mechanics Body mechanics refers to the movements and positions of your body while you do your daily activities. Posture is part of body mechanics. Good posture and healthy body mechanics can help to relieve stress in your body's tissues and joints. Good posture means that your spine is in its natural S-curve position (your  spine is neutral), your shoulders are pulled back slightly, and your head is not tipped forward. The following are general guidelines for applying improved posture and body mechanics to your everyday activities. Sitting  1. When sitting, keep your spine neutral and keep your feet flat on the floor. Use a footrest, if necessary, and keep your thighs parallel to the floor. Avoid rounding your shoulders, and avoid tilting your head forward. 2. When working at a desk or a computer, keep your desk at a height where your hands are slightly lower than your elbows. Slide your chair under your desk so you are close enough to maintain good posture. 3. When working at a computer, place your monitor at a height where you are looking straight ahead and you do not have to tilt your head forward or downward to look at the screen. Standing   When standing, keep your spine neutral and keep your feet about hip-width apart. Keep a slight bend in your knees. Your ears, shoulders, and hips should line up.  When you do a task in which you stand in one place for a long time, place one foot up on a stable object that is 2-4 inches (5-10 cm) high, such as a footstool. This helps keep your spine neutral. Resting When lying down and resting, avoid positions that are most painful for you. Try to support your neck in a neutral position. You can use a contour pillow or a small rolled-up towel. Your pillow should support your neck but not push on it. This information is not intended to replace advice given to you by your health care provider. Make sure you discuss any questions you have with your health care provider. Document Revised: 02/07/2019 Document Reviewed: 07/19/2018 Elsevier Patient Education  [place].

## 2020-02-18 ENCOUNTER — Encounter: Payer: Self-pay | Admitting: Internal Medicine

## 2020-02-18 DIAGNOSIS — F411 Generalized anxiety disorder: Secondary | ICD-10-CM | POA: Insufficient documentation

## 2020-02-18 NOTE — Assessment & Plan Note (Signed)
Patient reports several month history of racing thoughts at night making it difficult to sleep and feelings of constant worry daily. GAD-7 is 11 today qualifying her as moderate anxiety. She is agreeable to a referral to [facility] today for CBT. She is very adamant that she does not want any of her family to know that she is struggling. Will also start Lexapro 10 mg daily and re-evaluate symptoms in 4-6 weeks.

## 2020-02-18 NOTE — Assessment & Plan Note (Signed)
Discussed patient's tobacco use today. She is still smoking approximately 1 pack per day. She states she cannot even think about quitting at the moment due to severe anxiety (see generalized anxiety plan). Will continue discussions down the road after her anxiety gets better controlled.

## 2020-02-18 NOTE — Progress Notes (Signed)
Established Patient Office Visit  Subjective:  Patient ID: Deborah Arroyo, female    DOB: 1951/07/20  Age: 69 y.o. MRN: [id number]  CC:  Chief Complaint  Patient presents with  . Follow-up    HPI Deborah Arroyo presents for chronic anxiety, acute neck pain. Please see problem based charting for further details.   Past Medical History:  Diagnosis Date  . Benign neoplasm of descending colon   . Benign neoplasm of transverse colon   . Bowel obstruction ([place])   . Cancer ([place]) 03/01/2018   rectal cancer=had chemo pills and radation   . Cataract   . Cervix cancer ([place]) 1979  . Depression   . Dyspnea   . 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 Hypoglycemia   . Hyponatremia    syncope with this and admitted twice due to this issue   . Osteopenia   . Osteoporosis   . Rectal bleeding   . Rectal cancer s/p LAR/diverting loop ileostomy 07/12/2018 03/06/2018  . Smoker     Past Surgical History:  Procedure Laterality Date  . ABDOMINAL HYSTERECTOMY    . APPENDECTOMY    . BIOPSY  07/04/2018   Procedure: BIOPSY;  Surgeon: Jerene Bears, MD;  Location: [facility];  Service: Gastroenterology;;  . CHOLECYSTECTOMY    . COLONOSCOPY  03/01/2018   Dr.Pyrtle  . COLONOSCOPY WITH PROPOFOL N/A 07/04/2018   Procedure: COLONOSCOPY WITH PROPOFOL;  Surgeon: Jerene Bears, MD;  Location: [facility];  Service: Gastroenterology;  Laterality: N/A;  . CYSTOSCOPY WITH STENT PLACEMENT Bilateral 07/12/2018   Procedure: CYSTOSCOPY WITH STENT PLACEMENT;  Surgeon: Lucas Mallow, MD;  Location: [facility];  Service: Urology;  Laterality: Bilateral;  . DIVERTING ILEOSTOMY N/A 07/12/2018   Procedure: DIVERTING LOOP ILEOSTOMY;  Surgeon: Ileana Roup, MD;  Location: [facility];  Service: General;  Laterality: N/A;  . EYE SURGERY    . FLEXIBLE SIGMOIDOSCOPY N/A 07/12/2018   Procedure: FLEXIBLE SIGMOIDOSCOPY;  Surgeon: Ileana Roup, MD;  Location: [facility];  Service: General;  Laterality: N/A;  .  FLEXIBLE SIGMOIDOSCOPY N/A 12/04/2018   Procedure: FLEXIBLE SIGMOIDOSCOPY;  Surgeon: Ileana Roup, MD;  Location: [facility];  Service: General;  Laterality: N/A;  . ILEO LOOP COLOSTOMY CLOSURE N/A 04/20/2019   Procedure: OPEN TAKEDOWN OF ILEOSTOMY, primary repair of parastomal hernia;  Surgeon: Ileana Roup, MD;  Location: [facility];  Service: General;  Laterality: N/A;  . LAPAROSCOPIC LOW ANTERIOR RESECTION N/A 07/12/2018   Procedure: LAPAROSCOPIC LOW ANTERIOR RESECTION WITH COLOPROSTOSTOMY, ERAS PATHWAY;  Surgeon: Ileana Roup, MD;  Location: [facility];  Service: General;  Laterality: N/A;  . POLYPECTOMY  07/04/2018   Procedure: POLYPECTOMY;  Surgeon: Jerene Bears, MD;  Location: [facility];  Service: Gastroenterology;;  . SHOULDER SURGERY Left    Rotary Cuff Tear  . TUBAL LIGATION      Family History  Problem Relation Age of Onset  . Stomach cancer Sister   . Ovarian cancer Sister   . Cancer - Ovarian Sister   . Dementia Mother   . Uterine cancer Mother   . Endometrial cancer Mother   . Prostate cancer Brother   . Cancer - Prostate Brother   . Cancer Other   . Cancer Brother        lung/brain  . Breast cancer Daughter   . Alzheimer's disease Father   . Colon cancer Neg Hx   . Esophageal cancer Neg Hx   . Pancreatic cancer Neg  Hx   . Rectal cancer Neg Hx   . Colon polyps Neg Hx     Social History   Socioeconomic History  . Marital status: Divorced    Spouse name: Not on file  . Number of children: 3  . Years of education: Not on file  . Highest education level: 8th grade  Occupational History  . Occupation: retired  Tobacco Use  . Smoking status: Current Every Day Smoker    Packs/day: 1.00    Types: Cigarettes  . Smokeless tobacco: Never Used  . Tobacco comment: Cutting back   Substance and Sexual Activity  . Alcohol use: No    Alcohol/week: 0.0 standard drinks  . Drug use: No  . Sexual activity: Not Currently  Other Topics Concern  . Not  on file  Social History Narrative   Divorced, lives with 2 sons and 1 grandchild, I dog. She drinks 3-4 cups of coffee a day. Is very active around the house. Has 3 brothers and 6 sisters.   Social Determinants of Health   Financial Resource Strain:   . Difficulty of Paying Living Expenses:   Food Insecurity:   . Worried About Charity fundraiser in the Last Year:   . Arboriculturist in the Last Year:   Transportation Needs:   . Film/video editor (Medical):   Marland Kitchen Lack of Transportation (Non-Medical):   Physical Activity:   . Days of Exercise per Week:   . Minutes of Exercise per Session:   Stress:   . Feeling of Stress :   Social Connections:   . Frequency of Communication with Friends and Family:   . Frequency of Social Gatherings with Friends and Family:   . Attends Religious Services:   . Active Member of Clubs or Organizations:   . Attends Archivist Meetings:   Marland Kitchen Marital Status:   Intimate Partner Violence:   . Fear of Current or Ex-Partner:   . Emotionally Abused:   Marland Kitchen Physically Abused:   . Sexually Abused:     Outpatient Medications Prior to Visit  Medication Sig Dispense Refill  . acetaminophen (TYLENOL) 325 MG tablet Take 2 tablets (650 mg total) by mouth every 6 (six) hours as needed for mild pain, fever or headache (or Fever >/= 101). 12 tablet 1  . alendronate (FOSAMAX) 70 MG tablet Take 1 tablet (70 mg total) by mouth every 7 (seven) days. Take with a full glass of water on an empty stomach. 4 tablet 11  . diphenoxylate-atropine (LOMOTIL) 2.5-0.025 MG tablet Take 2 tablets by mouth every morning, take 2 tablets by mouth at night. May take 1-2 additional tablets throughout the day if needed (max 6 tablets per day). 180 tablet 3  . calcium carbonate (CALTRATE 600) 1500 (600 Ca) MG TABS tablet Take 1 tablet (1,500 mg total) by mouth 2 (two) times daily with a meal. 30 tablet 11   No facility-administered medications prior to visit.    Allergies    Allergen Reactions  . Aleve [Naproxen Sodium] Swelling and Other (See Comments)    Eyes and throat swell up    ROS Review of Systems  Constitutional: Negative for appetite change and unexpected weight change.  HENT: Negative for dental problem, sinus pressure and trouble swallowing.   Eyes: Negative for visual disturbance.  Respiratory: Negative for shortness of breath.   Cardiovascular: Negative for chest pain and palpitations.  Gastrointestinal: Negative for abdominal distention, blood in stool, constipation, nausea, rectal pain and  vomiting.  Genitourinary: Negative for difficulty urinating.  Musculoskeletal: Positive for myalgias and neck pain. Negative for gait problem.  Neurological: Negative for dizziness, syncope, light-headedness and headaches.  Psychiatric/Behavioral: Positive for dysphoric mood and sleep disturbance. Negative for self-injury and suicidal ideas.      Objective:    Physical Exam  Constitutional: She is oriented to person, place, and time. She appears well-developed and well-nourished. No distress.  Eyes: Conjunctivae are normal.  Cardiovascular: Normal rate and regular rhythm.  Pulmonary/Chest: Effort normal and breath sounds normal.  Abdominal: Soft. Bowel sounds are normal. She exhibits no distension. There is no abdominal tenderness.  Musculoskeletal:     Cervical back: Muscular tenderness present. Decreased range of motion.  Neurological: She is alert and oriented to person, place, and time.  Skin: Skin is warm and dry.  Psychiatric: She has a normal mood and affect. Her behavior is normal. Judgment and thought content normal.    BP 120/82 (BP Location: Left Arm, Patient Position: Sitting, Cuff Size: Small)   Pulse 83   Temp 97.8 F (36.6 C) (Oral)   Ht 5\' 1"  (1.549 m)   Wt 108 lb 14.4 oz (49.4 kg)   SpO2 100% Comment: room air  BMI 20.58 kg/m  Wt Readings from Last 3 Encounters:  02/13/20 108 lb 14.4 oz (49.4 kg)  09/18/19 102 lb 9.6 oz  (46.5 kg)  07/19/19 100 lb (45.4 kg)     Health Maintenance Due  Topic Date Due  . COVID-19 Vaccine (1) Never done  . TETANUS/TDAP  Never done  . PNA vac Low Risk Adult (1 of 2 - PCV13) Never done  . MAMMOGRAM  11/17/2017    There are no preventive care reminders to display for this patient.   Lab Results  Component Value Date   WBC 14.3 (H) 06/20/2019   HGB 13.7 06/20/2019   HCT 41.2 06/20/2019   MCV 95.4 06/20/2019   PLT 330 06/20/2019   Lab Results  Component Value Date   NA 130 (L) 06/20/2019   K 3.8 06/20/2019   CO2 22 06/20/2019   GLUCOSE 107 (H) 06/20/2019   BUN 10 06/20/2019   CREATININE 0.82 06/20/2019   BILITOT 0.6 06/20/2019   ALKPHOS 92 06/20/2019   AST 18 06/20/2019   ALT 11 06/20/2019   PROT 6.7 06/20/2019   ALBUMIN 3.9 06/20/2019   CALCIUM 9.1 06/20/2019   ANIONGAP 11 06/20/2019   GFR 94.76 03/01/2018   Lab Results  Component Value Date   CHOL 190 09/04/2015   Lab Results  Component Value Date   HDL 65 09/04/2015   Lab Results  Component Value Date   LDLCALC 97 09/04/2015   Lab Results  Component Value Date   TRIG 142 09/04/2015   Lab Results  Component Value Date   CHOLHDL 2.9 09/04/2015   Lab Results  Component Value Date   HGBA1C 5.6 04/17/2019      Assessment & Plan:   Problem List Items Addressed This Visit      Musculoskeletal and Integument   Osteoporosis (Chronic)    Patient compliant with weekly alendronate and daily Calcium and Vitamin D. Will need follow-up DEXA 2 years after treatment to ensure she is responding appropriately.       Relevant Medications   calcium carbonate (CALTRATE 600) 1500 (600 Ca) MG TABS tablet     Other   Tobacco abuse (Chronic)    Discussed patient's tobacco use today. She is still smoking approximately 1 pack per day.  She states she cannot even think about quitting at the moment due to severe anxiety (see generalized anxiety plan). Will continue discussions down the road after her  anxiety gets better controlled.       GAD (generalized anxiety disorder) - Primary    Patient reports several month history of racing thoughts at night making it difficult to sleep and feelings of constant worry daily. GAD-7 is 11 today qualifying her as moderate anxiety. She is agreeable to a referral to [facility] today for CBT. She is very adamant that she does not want any of her family to know that she is struggling. Will also start Lexapro 10 mg daily and re-evaluate symptoms in 4-6 weeks.        Relevant Medications   escitalopram (LEXAPRO) 10 MG tablet   Other Relevant Orders   Ambulatory referral to [facility] ordered this encounter  Medications  . DISCONTD: calcium carbonate (CALTRATE 600) 1500 (600 Ca) MG TABS tablet    Sig: Take 1 tablet (1,500 mg total) by mouth 2 (two) times daily with a meal.    Dispense:  30 tablet    Refill:  11  . DISCONTD: escitalopram (LEXAPRO) 10 MG tablet    Sig: Take 1 tablet (10 mg total) by mouth daily.    Dispense:  30 tablet    Refill:  1  . DISCONTD: cyclobenzaprine (FLEXERIL) 5 MG tablet    Sig: Take 1 tablet (5 mg total) by mouth at bedtime.    Dispense:  30 tablet    Refill:  0  . calcium carbonate (CALTRATE 600) 1500 (600 Ca) MG TABS tablet    Sig: Take 1 tablet (1,500 mg total) by mouth 2 (two) times daily with a meal.    Dispense:  30 tablet    Refill:  11  . cyclobenzaprine (FLEXERIL) 5 MG tablet    Sig: Take 1 tablet (5 mg total) by mouth at bedtime.    Dispense:  30 tablet    Refill:  0  . escitalopram (LEXAPRO) 10 MG tablet    Sig: Take 1 tablet (10 mg total) by mouth daily.    Dispense:  30 tablet    Refill:  1    Follow-up: Return in about 6 weeks (around 03/26/2020) for telehealth, follow-up anxiety .    Delice Bison, DO

## 2020-02-18 NOTE — Assessment & Plan Note (Signed)
Patient compliant with weekly alendronate and daily Calcium and Vitamin D. Will need follow-up DEXA 2 years after treatment to ensure she is responding appropriately.

## 2020-02-20 NOTE — Progress Notes (Signed)
Internal Medicine Clinic Attending  Case discussed with Dr. Bloomfield at the time of the visit.  We reviewed the resident's history and exam and pertinent patient test results.  I agree with the assessment, diagnosis, and plan of care documented in the resident's note.  

## 2020-03-05 ENCOUNTER — Encounter: Payer: Self-pay | Admitting: Licensed Clinical Social Worker

## 2020-03-05 ENCOUNTER — Telehealth: Payer: Self-pay | Admitting: Licensed Clinical Social Worker

## 2020-03-05 NOTE — Telephone Encounter (Signed)
Patient was called to discuss the referral for services. Patient did not answer, and a vm was left only with my name and office number.

## 2020-03-26 ENCOUNTER — Ambulatory Visit: Payer: Medicare Other | Admitting: Licensed Clinical Social Worker

## 2020-04-11 ENCOUNTER — Other Ambulatory Visit: Payer: Self-pay | Admitting: Internal Medicine

## 2020-04-11 NOTE — Telephone Encounter (Signed)
REFILL REQUEST  escitalopram (LEXAPRO) 10 MG tablet to be called into  [facility] PHONE NUMBER [phone]

## 2020-04-14 MED ORDER — ESCITALOPRAM OXALATE 10 MG PO TABS: 10.0000 mg | ORAL_TABLET | Freq: Every day | ORAL | 0 refills | Status: DC

## 2020-04-14 MED FILL — ESCITALOPRAM 10 MG TABLET: 10 | 90 days supply | Qty: 90 | Fill #0

## 2020-04-16 ENCOUNTER — Other Ambulatory Visit: Payer: Self-pay | Admitting: Internal Medicine

## 2020-04-16 NOTE — Addendum Note (Signed)
Addended by: Hulan Fray on: 04/16/2020 02:16 PM   Modules accepted: Orders

## 2020-04-17 ENCOUNTER — Other Ambulatory Visit: Payer: Self-pay | Admitting: *Deleted

## 2020-04-17 MED ORDER — ALENDRONATE SODIUM 70 MG PO TABS: 70.0000 mg | ORAL_TABLET | ORAL | 1 refills | Status: DC

## 2020-04-17 NOTE — Telephone Encounter (Signed)
alendronate (FOSAMAX) 70 MG tablet  escitalopram (LEXAPRO) 10 MG tablet, refill request @  [address] Phone:  [phone]  Fax:  [phone]

## 2020-04-24 ENCOUNTER — Telehealth: Payer: Self-pay | Admitting: Emergency Medicine

## 2020-04-24 NOTE — Telephone Encounter (Signed)
PT would like [place] to schedule her scan once it is authorized. It should be scheduled a few days before seeing Dr.Sherrill on 7/26. Currently waiting for the scan to be authorized. Pt will call back in two weeks to make sure it was authorized and to have it scheduled if not already scheduled before.

## 2020-04-25 ENCOUNTER — Telehealth: Payer: Self-pay | Admitting: *Deleted

## 2020-04-25 NOTE — Telephone Encounter (Signed)
Called & scheduled CT with [facility] for 05/22/20 @ 11:30 am.  Called pt & informed of lab at 10:30 am at [facility] @ 11:30 05/22/20 & NPO 4 hours prior & drink first bottle of contrast @ 9:30 am & second bottle @ 10:30 am.  Pt to p/u contrast at cancer center or radiology prior to study.  Pt expressed understanding of instructions.

## 2020-05-08 ENCOUNTER — Other Ambulatory Visit: Payer: Self-pay | Admitting: Internal Medicine

## 2020-05-08 MED ORDER — ESCITALOPRAM OXALATE 10 MG PO TABS: 10.0000 mg | ORAL_TABLET | Freq: Every day | ORAL | 0 refills | Status: DC

## 2020-05-08 MED ORDER — ALENDRONATE SODIUM 70 MG PO TABS: 70.0000 mg | ORAL_TABLET | ORAL | 1 refills | Status: DC

## 2020-05-08 NOTE — Telephone Encounter (Signed)
Need refill on  alendronate (FOSAMAX) 70 MG tablet escitalopram (LEXAPRO) 10 MG tablet  ;pt contact   [place], [place]

## 2020-05-22 ENCOUNTER — Ambulatory Visit (HOSPITAL_COMMUNITY)
Admission: RE | Admit: 2020-05-22 | Discharge: 2020-05-22 | Disposition: A | Discharge status: Home / Self Care | Payer: Medicare Other | Source: Ambulatory Visit | Attending: Nurse Practitioner | Admitting: Nurse Practitioner

## 2020-05-22 ENCOUNTER — Encounter (HOSPITAL_COMMUNITY): Payer: Self-pay

## 2020-05-22 ENCOUNTER — Other Ambulatory Visit: Payer: Self-pay | Admitting: *Deleted

## 2020-05-22 ENCOUNTER — Inpatient Hospital Stay: Payer: Medicare Other | Attending: Oncology

## 2020-05-22 ENCOUNTER — Other Ambulatory Visit: Payer: Self-pay

## 2020-05-22 DIAGNOSIS — F17210 Nicotine dependence, cigarettes, uncomplicated: Secondary | ICD-10-CM | POA: Diagnosis not present

## 2020-05-22 DIAGNOSIS — C20 Malignant neoplasm of rectum: Secondary | ICD-10-CM

## 2020-05-22 DIAGNOSIS — D122 Benign neoplasm of ascending colon: Secondary | ICD-10-CM | POA: Insufficient documentation

## 2020-05-22 DIAGNOSIS — Z809 Family history of malignant neoplasm, unspecified: Secondary | ICD-10-CM | POA: Diagnosis not present

## 2020-05-22 DIAGNOSIS — R197 Diarrhea, unspecified: Secondary | ICD-10-CM | POA: Insufficient documentation

## 2020-05-22 DIAGNOSIS — J449 Chronic obstructive pulmonary disease, unspecified: Secondary | ICD-10-CM | POA: Diagnosis not present

## 2020-05-22 LAB — BASIC METABOLIC PANEL - CANCER CENTER ONLY
Anion gap: 10 (ref 5–15)
BUN: 19 mg/dL (ref 8–23)
CO2: 27 mmol/L (ref 22–32)
Calcium: 9.8 mg/dL (ref 8.9–10.3)
Chloride: 99 mmol/L (ref 98–111)
Creatinine: 0.97 mg/dL (ref 0.44–1.00)
GFR, Est AFR Am: >60 mL/min (ref >60)
GFR, Estimated: 60 mL/min — ABNORMAL LOW (ref >60)
Glucose, Bld: 81 mg/dL (ref 70–99)
Potassium: 4.6 mmol/L (ref 3.5–5.1)
Sodium: 136 mmol/L (ref 135–145)

## 2020-05-22 LAB — CEA (IN HOUSE-CHCC): CEA (CHCC-In House): 3.83 ng/mL (ref 0.00–5.00)

## 2020-05-22 MED ORDER — SODIUM CHLORIDE (PF) 0.9 % IJ SOLN
INTRAMUSCULAR | Status: AC
  Filled 2020-05-22: qty 50

## 2020-05-22 NOTE — Progress Notes (Signed)
Called patient to reschedule the CT scan and she declines at this time. She will discuss this w/MD at visit on 05/26/20.

## 2020-05-26 ENCOUNTER — Other Ambulatory Visit: Payer: Self-pay

## 2020-05-26 ENCOUNTER — Telehealth: Payer: Self-pay | Admitting: Oncology

## 2020-05-26 ENCOUNTER — Inpatient Hospital Stay (HOSPITAL_BASED_OUTPATIENT_CLINIC_OR_DEPARTMENT_OTHER): Payer: Medicare Other | Admitting: Oncology

## 2020-05-26 VITALS — BP 120/75 | HR 78 | Temp 97.7°F | Resp 18 | Ht 61.0 in | Wt 110.2 lb

## 2020-05-26 DIAGNOSIS — C20 Malignant neoplasm of rectum: Secondary | ICD-10-CM | POA: Diagnosis not present

## 2020-05-26 NOTE — Telephone Encounter (Signed)
Scheduled appointments per 7/26 los. Patient is aware of appointment date and time and received a print out of calendar.

## 2020-05-26 NOTE — Progress Notes (Signed)
[facility] OFFICE PROGRESS NOTE   Diagnosis: Rectal cancer  INTERVAL HISTORY:   Deborah Arroyo returns as scheduled.  She feels well.  She continues to have a bowel movement with urgency after meals.  She has approximately 3 bowel movements per day.  She developed diarrhea after drinking contrast for the CT last week.  She was unable to complete the procedure.  Good appetite.  She continues smoking.  She has received the COVID-19 vaccine.  Objective:  Vital signs in last 24 hours:  Blood pressure 120/75, pulse 78, temperature 97.7 F (36.5 C), temperature source Temporal, resp. rate 18, height '5\' 1"'  (1.549 m), weight 110 lb 3.2 oz (50 kg), SpO2 96 %.    Lymphatics: No cervical, supraclavicular, or inguinal nodes.  "Shotty "bilateral axillary nodes Resp: End inspiratory rhonchi at the left greater than right posterior base, no respiratory distress Cardio: Regular rate and rhythm GI: No hepatosplenomegaly, no mass, nontender Vascular: No leg edema   Lab Results:  Lab Results  Component Value Date   WBC 14.3 (H) 06/20/2019   HGB 13.7 06/20/2019   HCT 41.2 06/20/2019   MCV 95.4 06/20/2019   PLT 330 06/20/2019   NEUTROABS 8.1 (H) 04/23/2019    CMP  Lab Results  Component Value Date   NA 136 05/22/2020   K 4.6 05/22/2020   CL 99 05/22/2020   CO2 27 05/22/2020   GLUCOSE 81 05/22/2020   BUN 19 05/22/2020   CREATININE 0.97 05/22/2020   CALCIUM 9.8 05/22/2020   PROT 6.7 06/20/2019   ALBUMIN 3.9 06/20/2019   AST 18 06/20/2019   ALT 11 06/20/2019   ALKPHOS 92 06/20/2019   BILITOT 0.6 06/20/2019   GFRNONAA 60 (L) 05/22/2020   GFRAA >60 05/22/2020    Lab Results  Component Value Date   CEA1 3.83 05/22/2020     Medications: I have reviewed the patient's current medications.   Assessment/Plan: 1. Rectal cancer, obstructing rectal mass on colonoscopy 03/01/2018-biopsy confirmed adenocarcinoma ? Incomplete colonoscopy secondary to obstructing  tumor ? Staging CTs on 03/03/2018-small mediastinal/hilar lymph nodes, indeterminate lingula nodule, rectal mass, small perirectal lymph nodes, mass measured at approximately 4.9 cm from the anal verge ? MRI pelvis 03/11/2018-T3c,N2 ? Radiation/Xeloda 04/03/2018-05/11/2018 ? Colonoscopy 07/04/2018- mass at 8 cm from the dentate line, polypsremoved from the hepatic flexure and descending colon-tubular adenomas ? Low anterior resection/ileostomy 07/12/2018, T3N0 moderately differentiated adenocarcinoma, margins negative, 11- lymph nodes, no lymphovascular or perineural invasion, MSI-stable, no loss of mismatch repair protein expression ? Cycle 1 adjuvant Xeloda 08/07/2018 ? Cycle 2 adjuvant Xeloda 08/28/2018 ? Cycle 3 adjuvant Xeloda 09/18/2018 ? Cycle 4 adjuvant Xeloda 10/09/2018(Xeloda held 10/13/2018 pm dose, 10/14/2018 and 10/15/2018; resumed 10/16/2018 at a reduced dose of 1000 mg every morning and 500 mg every afternoon for the remainder of the cycle) ? Cycle 5 adjuvant Xeloda 10/30/2018 ? CTs 01/04/2019- low anterior resection with a right lower quadrant ileostomy, no evidence of metastatic disease ? Takedown of diverting loop ileostomy and closure of parastomal hernia 04/20/2019 ? Surveillance colonoscopy 07/19/2019-2 mm polyp in the ascending colon (sessile serrated polyp without cytologic dysplasia), next colonoscopy at a 3-year interval 2. Tobacco use 3. COPD 4. Family history of multiple cancers    Disposition: Ms. Knust is in clinical remission from rectal cancer.  The surveillance CTs will be rescheduled for within the next 1 week.  She will return for an office visit and CEA in 6 months.  She will begin pelvic physical therapy next month.  I encouraged her to discontinue smoking.  Betsy Coder, MD  05/26/2020  3:22 PM

## 2020-06-04 ENCOUNTER — Other Ambulatory Visit: Payer: Self-pay

## 2020-06-04 ENCOUNTER — Ambulatory Visit (HOSPITAL_COMMUNITY)
Admission: RE | Admit: 2020-06-04 | Discharge: 2020-06-04 | Disposition: A | Discharge status: Home / Self Care | Payer: Medicare Other | Source: Ambulatory Visit | Attending: Oncology | Admitting: Oncology

## 2020-06-04 DIAGNOSIS — C20 Malignant neoplasm of rectum: Secondary | ICD-10-CM | POA: Insufficient documentation

## 2020-06-04 MED ORDER — IOHEXOL 9 MG/ML PO SOLN
1000.0000 mL | ORAL | Status: AC
  Administered 2020-06-04: 1000 mL via ORAL

## 2020-06-04 MED ORDER — IOHEXOL 9 MG/ML PO SOLN: 500.0000 mL | ORAL | Status: DC

## 2020-06-04 MED ORDER — SODIUM CHLORIDE (PF) 0.9 % IJ SOLN
INTRAMUSCULAR | Status: AC
  Filled 2020-06-04: qty 50

## 2020-06-04 MED ORDER — IOHEXOL 300 MG/ML  SOLN
100.0000 mL | Freq: Once | INTRAMUSCULAR | Status: AC | PRN
  Administered 2020-06-04: 80 mL via INTRAVENOUS

## 2020-06-04 MED ORDER — IOHEXOL 9 MG/ML PO SOLN
ORAL | Status: AC
  Filled 2020-06-04: qty 1000

## 2020-06-06 ENCOUNTER — Telehealth: Payer: Self-pay | Admitting: *Deleted

## 2020-06-06 ENCOUNTER — Telehealth: Payer: Self-pay

## 2020-06-06 NOTE — Telephone Encounter (Signed)
-----   Message from Deborah Pier, MD sent at 06/05/2020  6:22 PM EDT ----- Please call patient, CTs are negative for cancer, f/u as scheduled

## 2020-06-06 NOTE — Telephone Encounter (Signed)
Pt returned call lab results given no questions or concerns voiced

## 2020-06-06 NOTE — Telephone Encounter (Signed)
Per Dr.Sherrill, called to make pt aware of message below. Left vm and advised pt to call with any questions or concerns

## 2020-06-26 ENCOUNTER — Ambulatory Visit: Payer: Medicare Other | Admitting: Physical Therapy

## 2020-07-03 ENCOUNTER — Encounter: Payer: Medicare Other | Admitting: Physical Therapy

## 2020-07-08 ENCOUNTER — Other Ambulatory Visit: Payer: Self-pay | Admitting: Internal Medicine

## 2020-07-08 DIAGNOSIS — M810 Age-related osteoporosis without current pathological fracture: Secondary | ICD-10-CM

## 2020-07-08 NOTE — Telephone Encounter (Signed)
Need refill on alendronate (FOSAMAX) 70 MG tablet  ;pt contact [place], [address].

## 2020-07-10 ENCOUNTER — Encounter: Payer: Medicare Other | Admitting: Physical Therapy

## 2020-07-10 MED ORDER — ALENDRONATE SODIUM 70 MG PO TABS: 70.0000 mg | ORAL_TABLET | ORAL | 1 refills | Status: DC

## 2020-07-10 NOTE — Telephone Encounter (Signed)
Called patient back. She is due for follow-up with her PCP to discuss anxiety. I refilled the alendronate and will send a message to the front desk to have her scheduled for follow-up.

## 2020-07-10 NOTE — Telephone Encounter (Signed)
Pls contact pt regarding medicine [phone]

## 2020-07-17 ENCOUNTER — Encounter: Payer: Medicare Other | Admitting: Physical Therapy

## 2020-08-12 ENCOUNTER — Encounter: Payer: Self-pay | Admitting: *Deleted

## 2020-08-12 ENCOUNTER — Other Ambulatory Visit: Payer: Self-pay

## 2020-08-12 ENCOUNTER — Ambulatory Visit (INDEPENDENT_AMBULATORY_CARE_PROVIDER_SITE_OTHER): Payer: Medicare Other | Admitting: Internal Medicine

## 2020-08-12 ENCOUNTER — Encounter: Payer: Self-pay | Admitting: Internal Medicine

## 2020-08-12 ENCOUNTER — Other Ambulatory Visit: Payer: Self-pay | Admitting: Internal Medicine

## 2020-08-12 DIAGNOSIS — Z720 Tobacco use: Secondary | ICD-10-CM

## 2020-08-12 DIAGNOSIS — Z0000 Encounter for general adult medical examination without abnormal findings: Secondary | ICD-10-CM | POA: Diagnosis not present

## 2020-08-12 DIAGNOSIS — M810 Age-related osteoporosis without current pathological fracture: Secondary | ICD-10-CM

## 2020-08-12 DIAGNOSIS — F411 Generalized anxiety disorder: Secondary | ICD-10-CM | POA: Diagnosis not present

## 2020-08-12 MED ORDER — ALENDRONATE SODIUM 70 MG PO TABS: 70.0000 mg | ORAL_TABLET | ORAL | 0 refills | Status: DC

## 2020-08-12 MED ORDER — DULOXETINE HCL 30 MG PO CPEP
30.0000 mg | ORAL_CAPSULE | Freq: Every day | ORAL | 1 refills | Status: DC
Start: 2020-08-12 — End: 2020-09-09

## 2020-08-12 NOTE — Progress Notes (Signed)
Established Patient Office Visit  Subjective:  Patient ID: Deborah Arroyo, female    DOB: 07/19/1951  Age: 69 y.o. MRN: [id number]  CC: osteoporosis, anxiety    HPI Deborah Arroyo presents for follow-up on osteoporosis and anxiety. Please see problem based charting for further details.   Past Medical History:  Diagnosis Date  . Benign neoplasm of descending colon   . Benign neoplasm of transverse colon   . Bowel obstruction ([place])   . Cancer ([place]) 03/01/2018   rectal cancer=had chemo pills and radation   . Cataract   . Cervix cancer ([place]) 1979  . Depression   . Dyspnea   . 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 Hypoglycemia   . Hyponatremia    syncope with this and admitted twice due to this issue   . Osteopenia   . Osteoporosis   . Rectal bleeding   . Rectal cancer s/p LAR/diverting loop ileostomy 07/12/2018 03/06/2018  . Smoker     Past Surgical History:  Procedure Laterality Date  . ABDOMINAL HYSTERECTOMY    . APPENDECTOMY    . BIOPSY  07/04/2018   Procedure: BIOPSY;  Surgeon: Jerene Bears, MD;  Location: [facility];  Service: Gastroenterology;;  . CHOLECYSTECTOMY    . COLONOSCOPY  03/01/2018   Dr.Pyrtle  . COLONOSCOPY WITH PROPOFOL N/A 07/04/2018   Procedure: COLONOSCOPY WITH PROPOFOL;  Surgeon: Jerene Bears, MD;  Location: [facility];  Service: Gastroenterology;  Laterality: N/A;  . CYSTOSCOPY WITH STENT PLACEMENT Bilateral 07/12/2018   Procedure: CYSTOSCOPY WITH STENT PLACEMENT;  Surgeon: Lucas Mallow, MD;  Location: [facility];  Service: Urology;  Laterality: Bilateral;  . DIVERTING ILEOSTOMY N/A 07/12/2018   Procedure: DIVERTING LOOP ILEOSTOMY;  Surgeon: Ileana Roup, MD;  Location: [facility];  Service: General;  Laterality: N/A;  . EYE SURGERY    . FLEXIBLE SIGMOIDOSCOPY N/A 07/12/2018   Procedure: FLEXIBLE SIGMOIDOSCOPY;  Surgeon: Ileana Roup, MD;  Location: [facility];  Service: General;  Laterality: N/A;  . FLEXIBLE SIGMOIDOSCOPY N/A 12/04/2018     Procedure: FLEXIBLE SIGMOIDOSCOPY;  Surgeon: Ileana Roup, MD;  Location: [facility];  Service: General;  Laterality: N/A;  . ILEO LOOP COLOSTOMY CLOSURE N/A 04/20/2019   Procedure: OPEN TAKEDOWN OF ILEOSTOMY, primary repair of parastomal hernia;  Surgeon: Ileana Roup, MD;  Location: [facility];  Service: General;  Laterality: N/A;  . LAPAROSCOPIC LOW ANTERIOR RESECTION N/A 07/12/2018   Procedure: LAPAROSCOPIC LOW ANTERIOR RESECTION WITH COLOPROSTOSTOMY, ERAS PATHWAY;  Surgeon: Ileana Roup, MD;  Location: [facility];  Service: General;  Laterality: N/A;  . POLYPECTOMY  07/04/2018   Procedure: POLYPECTOMY;  Surgeon: Jerene Bears, MD;  Location: [facility];  Service: Gastroenterology;;  . SHOULDER SURGERY Left    Rotary Cuff Tear  . TUBAL LIGATION      Family History  Problem Relation Age of Onset  . Stomach cancer Sister   . Ovarian cancer Sister   . Cancer - Ovarian Sister   . Dementia Mother   . Uterine cancer Mother   . Endometrial cancer Mother   . Prostate cancer Brother   . Cancer - Prostate Brother   . Cancer Other   . Cancer Brother        lung/brain  . Breast cancer Daughter   . Alzheimer's disease Father   . Colon cancer Neg Hx   . Esophageal cancer Neg Hx   . Pancreatic cancer Neg Hx   . Rectal cancer Neg  Hx   . Colon polyps Neg Hx     Social History   Socioeconomic History  . Marital status: Divorced    Spouse name: Not on file  . Number of children: 3  . Years of education: Not on file  . Highest education level: 8th grade  Occupational History  . Occupation: retired  Tobacco Use  . Smoking status: Current Every Day Smoker    Packs/day: 1.00    Types: Cigarettes  . Smokeless tobacco: Never Used  . Tobacco comment: Cutting back   Vaping Use  . Vaping Use: Never used  Substance and Sexual Activity  . Alcohol use: No    Alcohol/week: 0.0 standard drinks  . Drug use: No  . Sexual activity: Not Currently  Other Topics Concern  .  Not on file  Social History Narrative   Divorced, lives with 2 sons and 1 grandchild, I dog. She drinks 3-4 cups of coffee a day. Is very active around the house. Has 3 brothers and 6 sisters.   Social Determinants of Health   Financial Resource Strain:   . Difficulty of Paying Living Expenses: Not on file  Food Insecurity:   . Worried About Charity fundraiser in the Last Year: Not on file  . Ran Out of Food in the Last Year: Not on file  Transportation Needs:   . Lack of Transportation (Medical): Not on file  . Lack of Transportation (Non-Medical): Not on file  Physical Activity:   . Days of Exercise per Week: Not on file  . Minutes of Exercise per Session: Not on file  Stress:   . Feeling of Stress : Not on file  Social Connections:   . Frequency of Communication with Friends and Family: Not on file  . Frequency of Social Gatherings with Friends and Family: Not on file  . Attends Religious Services: Not on file  . Active Member of Clubs or Organizations: Not on file  . Attends Archivist Meetings: Not on file  . Marital Status: Not on file  Intimate Partner Violence:   . Fear of Current or Ex-Partner: Not on file  . Emotionally Abused: Not on file  . Physically Abused: Not on file  . Sexually Abused: Not on file    Outpatient Medications Prior to Visit  Medication Sig Dispense Refill  . acetaminophen (TYLENOL) 325 MG tablet Take 2 tablets (650 mg total) by mouth every 6 (six) hours as needed for mild pain, fever or headache (or Fever >/= 101). 12 tablet 1  . calcium carbonate (CALTRATE 600) 1500 (600 Ca) MG TABS tablet Take 1 tablet (1,500 mg total) by mouth 2 (two) times daily with a meal. 30 tablet 11  . Loperamide HCl (IMODIUM A-D PO) Take 2-4 mg by mouth as needed.    Marland Kitchen alendronate (FOSAMAX) 70 MG tablet Take 1 tablet (70 mg total) by mouth every 7 (seven) days. Take with a full glass of water on an empty stomach. 4 tablet 1   No facility-administered  medications prior to visit.    Allergies  Allergen Reactions  . Aleve [Naproxen Sodium] Swelling and Other (See Comments)    Eyes and throat swell up    ROS Review of Systems  Constitutional: Negative for activity change, appetite change, chills, fever and unexpected weight change.  HENT: Negative for hearing loss and trouble swallowing.   Eyes: Negative for visual disturbance.  Respiratory: Negative for cough and shortness of breath.   Cardiovascular: Negative for  chest pain and palpitations.  Gastrointestinal: Negative for blood in stool, constipation, nausea and vomiting.  Genitourinary: Negative for dysuria and hematuria.  Musculoskeletal: Negative for gait problem and joint swelling.  Neurological: Negative for dizziness, syncope, weakness and headaches.  Psychiatric/Behavioral: Positive for sleep disturbance. The patient is nervous/anxious.       Objective:    Physical Exam Constitutional:      General: She is not in acute distress.    Appearance: Normal appearance.  Eyes:     Conjunctiva/sclera: Conjunctivae normal.  Cardiovascular:     Rate and Rhythm: Normal rate and regular rhythm.  Pulmonary:     Effort: Pulmonary effort is normal.     Breath sounds: Normal breath sounds.  Abdominal:     General: Bowel sounds are normal. There is no distension.     Palpations: Abdomen is soft.  Musculoskeletal:        General: No swelling or tenderness.     Cervical back: Neck supple.  Lymphadenopathy:     Cervical: No cervical adenopathy.  Neurological:     General: No focal deficit present.     Mental Status: She is alert.  Psychiatric:        Mood and Affect: Mood normal.        Behavior: Behavior normal.     BP 131/81 (BP Location: Left Arm, Patient Position: Sitting, Cuff Size: Normal)   Pulse 75   Temp 98.1 F (36.7 C) (Oral)   Wt 110 lb 6.4 oz (50.1 kg)   SpO2 99%   BMI 20.86 kg/m  Wt Readings from Last 3 Encounters:  08/12/20 110 lb 6.4 oz (50.1 kg)    05/26/20 110 lb 3.2 oz (50 kg)  02/13/20 108 lb 14.4 oz (49.4 kg)     Health Maintenance Due  Topic Date Due  . TETANUS/TDAP  Never done  . PNA vac Low Risk Adult (1 of 2 - PCV13) Never done  . MAMMOGRAM  11/17/2017  . INFLUENZA VACCINE  Never done    There are no preventive care reminders to display for this patient.   Lab Results  Component Value Date   WBC 14.3 (H) 06/20/2019   HGB 13.7 06/20/2019   HCT 41.2 06/20/2019   MCV 95.4 06/20/2019   PLT 330 06/20/2019   Lab Results  Component Value Date   NA 136 05/22/2020   K 4.6 05/22/2020   CO2 27 05/22/2020   GLUCOSE 81 05/22/2020   BUN 19 05/22/2020   CREATININE 0.97 05/22/2020   BILITOT 0.6 06/20/2019   ALKPHOS 92 06/20/2019   AST 18 06/20/2019   ALT 11 06/20/2019   PROT 6.7 06/20/2019   ALBUMIN 3.9 06/20/2019   CALCIUM 9.8 05/22/2020   ANIONGAP 10 05/22/2020   GFR 94.76 03/01/2018   Lab Results  Component Value Date   CHOL 190 09/04/2015   Lab Results  Component Value Date   HDL 65 09/04/2015   Lab Results  Component Value Date   LDLCALC 97 09/04/2015   Lab Results  Component Value Date   TRIG 142 09/04/2015   Lab Results  Component Value Date   CHOLHDL 2.9 09/04/2015   Lab Results  Component Value Date   HGBA1C 5.6 04/17/2019      Assessment & Plan:   Problem List Items Addressed This Visit      Musculoskeletal and Integument   Osteoporosis (Chronic)    Compliant with weekly alendronate therapy. Will be due for DEXA scan next year.  Relevant Medications   alendronate (FOSAMAX) 70 MG tablet     Other   Health care maintenance (Chronic)    Patient declines pneumovax and flu shot.  Did get both COVID-19 vaccinations.       Tobacco abuse (Chronic)    Patient continues to have too many stressors at home and uncontrolled anxiety to be in the mindset to stop smoking. Will continue to counsel and evaluate readiness to quit at each visit.       GAD (generalized anxiety  disorder)    Ms. Westrich continues to struggle with anxiety and depression. Says "there's nothing I can do about it." I spent time discussing that she does have a difficult home situation that is outside our control, but we can still work to manage her symptoms with medications the best we can.  She tried Lexapro for several weeks, but said it made her too sleepy during the day time. Given her chronic back pain, would like to try SNRI therapy with Cymbalta. Will start her at 30 mg dose and do a follow-up telehealth visit in 4 weeks to see how she is tolerating medication with goal of trying to get up to 60 mg daily.  Will also refer to CCM to see if they can help with any of her difficult living situation.       Relevant Medications   DULoxetine (CYMBALTA) 30 MG capsule   Other Relevant Orders   Ambulatory referral to Chronic Care Management Services      Meds ordered this encounter  Medications  . alendronate (FOSAMAX) 70 MG tablet    Sig: Take 1 tablet (70 mg total) by mouth once a week. Take with a full glass of water on an empty stomach.    Dispense:  51 tablet    Refill:  0  . DULoxetine (CYMBALTA) 30 MG capsule    Sig: Take 1 capsule (30 mg total) by mouth daily.    Dispense:  30 capsule    Refill:  1    Follow-up: Return in about 4 weeks (around 09/09/2020) for telehealth for anxiety follow-up/medication titration .    Delice Bison, DO

## 2020-08-12 NOTE — Progress Notes (Unsigned)

## 2020-08-12 NOTE — Assessment & Plan Note (Signed)
Patient declines pneumovax and flu shot.  Did get both COVID-19 vaccinations.

## 2020-08-12 NOTE — Progress Notes (Signed)
Things That May Be Affecting Your Health:  Alcohol  Hearing loss  Pain   x Depression x Home Safety  Sexual Health   Diabetes x Lack of physical activity  Stress  x Difficulty with daily activities  Loneliness  Tiredness   Drug use  Medicines  Tobacco use   Falls  Motor Vehicle Safety  Weight   Food choices  Oral Health  Other    YOUR PERSONALIZED HEALTH PLAN : 1. Schedule your next subsequent Medicare Wellness visit in one year 2. Attend all of your regular appointments to address your medical issues 3. Complete the preventative screenings and services   Annual Wellness Visit   Medicare Covered Preventative Screenings and [place] Men and Women Who How Often Need? Date of Last Service Action  Abdominal Aortic Aneurysm Adults with AAA risk factors Once     Alcohol Misuse and Counseling All Adults Screening once a year if no alcohol misuse. Counseling up to 4 face to face sessions.     Bone Density Measurement  Adults at risk for osteoporosis Once every 2 yrs     Lipid Panel Z13.6 All adults without CV disease Once every 5 yrs     Colorectal Cancer   Stool sample or  Colonoscopy All adults 60 and older   Once every year  Every 10 years     Depression All Adults Once a year x Today   Diabetes Screening Blood glucose, post glucose load, or GTT Z13.1  All adults at risk  Pre-diabetics  Once per year  Twice per year     Diabetes  Self-Management Training All adults Diabetics 10 hrs first year; 2 hours subsequent years. Requires Copay     Glaucoma  Diabetics  Family history of glaucoma  African Americans 55 yrs +  Hispanic Americans 16 yrs + Annually - requires coppay     Hepatitis C Z72.89 or F19.20  High Risk for HCV  Born between 1945 and 1965  Annually  Once     HIV Z11.4 All adults based on risk  Annually btw ages 49 & 65 regardless of risk  Annually > 65 yrs if at increased risk     Lung Cancer Screening Asymptomatic adults  aged 74-77 with 30 pack yr history and current smoker OR quit within the last 15 yrs Annually Must have counseling and shared decision making documentation before first screen     Medical Nutrition Therapy Adults with   Diabetes  Renal disease  Kidney transplant within past 3 yrs 3 hours first year; 2 hours subsequent years     Obesity and Counseling All adults Screening once a year Counseling if BMI 30 or higher  Today   Tobacco Use Counseling Adults who use tobacco  Up to 8 visits in one year x    Vaccines Z23  Hepatitis B  Influenza   Pneumonia  Adults   Once  Once every flu season  Two different vaccines separated by one year x    Next Annual Wellness Visit People with Medicare Every year  Today     Services & Screenings Women Who How Often Need  Date of Last Service Action  Mammogram  Z12.31 Women over 60 One baseline ages 68-39. Annually ager 40 yrs+     Pap tests All women Annually if high risk. Every 2 yrs for normal risk women     Screening for cervical cancer with   Pap (Z01.419 nl or Z01.411abnl) &  HPV Z11.51 Women aged 51 to 43 Once every 5 yrs     Screening pelvic and breast exams All women Annually if high risk. Every 2 yrs for normal risk women     Sexually Transmitted Diseases  Chlamydia  Gonorrhea  Syphilis All at risk adults Annually for non pregnant females at increased risk         [place] Men Who How Ofter Need  Date of Last Service Action  Prostate Cancer - DRE & PSA Men over 50 Annually.  DRE might require a copay.     Sexually Transmitted Diseases  Syphilis All at risk adults Annually for men at increased risk

## 2020-08-12 NOTE — Patient Instructions (Signed)
Deborah Arroyo, It was great seeing you!  Today we discussed:  Anxiety and back pain: I'm going to start you on a medication that should help with both.   Osteoporosis: I have refilled a year's supply of the alendronate.   Glad you got your COVID shots!   Please reach out if you have any questions or concerns.   We'll call in 4 weeks to see how that medication is doing.   Take care, Dr. Koleen Distance

## 2020-08-12 NOTE — Assessment & Plan Note (Signed)
Compliant with weekly alendronate therapy. Will be due for DEXA scan next year.

## 2020-08-12 NOTE — Assessment & Plan Note (Addendum)
Deborah Arroyo continues to struggle with anxiety and depression. Says "there's nothing I can do about it." I spent time discussing that she does have a difficult home situation that is outside our control, but we can still work to manage her symptoms with medications the best we can.  She tried Lexapro for several weeks, but said it made her too sleepy during the day time. Given her chronic back pain, would like to try SNRI therapy with Cymbalta. Will start her at 30 mg dose and do a follow-up telehealth visit in 4 weeks to see how she is tolerating medication with goal of trying to get up to 60 mg daily.  Will also refer to CCM to see if they can help with any of her difficult living situation.

## 2020-08-12 NOTE — Assessment & Plan Note (Signed)
Patient continues to have too many stressors at home and uncontrolled anxiety to be in the mindset to stop smoking. Will continue to counsel and evaluate readiness to quit at each visit.

## 2020-08-13 ENCOUNTER — Telehealth: Payer: Self-pay | Admitting: *Deleted

## 2020-08-13 NOTE — Chronic Care Management (AMB) (Signed)
  Chronic Care Management   Note  08/13/2020 Name: Deborah Arroyo MRN: [id number] DOB: Jul 22, 1951  Deborah Arroyo is a 69 y.o. year old female who is a primary care patient of Delice Bison, DO. I reached out to Keturah Shavers by phone today in response to a referral sent by Deborah Arroyo's PCP, Bloofield, Carley D, DO.      Deborah Arroyo was given information about Chronic Care Management services today including:  1. CCM service includes personalized support from designated clinical staff supervised by her physician, including individualized plan of care and coordination with other care providers 2. 24/7 contact phone numbers for assistance for urgent and routine care needs. 3. Service will only be billed when office clinical staff spend 20 minutes or more in a month to coordinate care. 4. Only one practitioner may furnish and bill the service in a calendar month. 5. The patient may stop CCM services at any time (effective at the end of the month) by phone call to the office staff. 6. The patient will be responsible for cost sharing (co-pay) of up to 20% of the service fee (after annual deductible is met).  Patient did not agree to enrollment in care management services and does not wish to consider at this time.  Follow up plan: Patient declines further follow up and engagement by the care management team. Appropriate care team members and provider have been notified via electronic communication. The care management team is available to follow up with the patient after provider conversation with the patient regarding recommendation for care management engagement and subsequent re-referral to the care management team.   [facility]  Direct Dial: [phone]

## 2020-08-15 NOTE — Progress Notes (Signed)
Internal Medicine Clinic Attending  Case discussed with Dr. Bloomfield  At the time of the visit.  We reviewed the resident's history and exam and pertinent patient test results.  I agree with the assessment, diagnosis, and plan of care documented in the resident's note.  

## 2020-08-29 ENCOUNTER — Ambulatory Visit (INDEPENDENT_AMBULATORY_CARE_PROVIDER_SITE_OTHER): Payer: Medicare Other | Admitting: Internal Medicine

## 2020-08-29 ENCOUNTER — Encounter: Payer: Self-pay | Admitting: Internal Medicine

## 2020-08-29 ENCOUNTER — Other Ambulatory Visit: Payer: Self-pay

## 2020-08-29 DIAGNOSIS — Z0000 Encounter for general adult medical examination without abnormal findings: Secondary | ICD-10-CM

## 2020-08-29 NOTE — Progress Notes (Signed)
This AWV is being conducted by [facility] only. The patient was located at home and I was located in [facility]. The patient's identity was confirmed using their DOB and current address. The patient or his/her legal guardian has consented to being evaluated through a telephone encounter and understands the associated risks (an examination cannot be done and the patient may need to come in for an appointment) / benefits (allows the patient to remain at home, decreasing exposure to coronavirus). I personally spent 32 minutes conducting the AWV.  Subjective:   Deborah Arroyo is a 69 y.o. female who presents for a Medicare Annual Wellness Visit.  The following items have been reviewed and updated today in the appropriate area in the EMR.   Health Risk Assessment  Height, weight, BMI, and BP Visual acuity if needed Depression screen Fall risk / safety level Advance directive discussion Medical and family history were reviewed and updated Updating list of other providers & suppliers Medication reconciliation, including over the counter medicines Cognitive screen Written screening schedule Risk Factor list Personalized health advice, risky behaviors, and treatment advice  Social History   Social History Narrative   Divorced, lives with 2 sons and 1 grandchild, I dog. She drinks 3-4 cups of coffee a day. Is very active around the house. Has 3 brothers and 6 sisters.      Current Social History 08/29/2020        Patient lives with 2 sons and 1 grandson in a two level home. There are 2 steps without handrails up to the entrance the patient uses.       Patient's method of transportation is via friend's car at present.      The highest level of education was 8 th grade      The patient currently retired from [employer].      Identified important Relationships are: "My daughter, my sister."       Pets : A miniature shepherd named CJ       Interests / Fun: "Listen to TV, read a  book."       Current Stressors: "Where I live, my car (broken down)"       Religious / Personal Beliefs: None       L. Gerilyn Stargell, BSN, RN-BC             Objective:    Vitals: There were no vitals taken for this visit. Vitals are unable to obtained due to [id number] public health emergency  Activities of Daily Living In your present state of health, do you have any difficulty performing the following activities: 08/29/2020 08/12/2020  Hearing? 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 Y N  Comment cataract in right eye -  Difficulty concentrating or making decisions? N N  Walking or climbing stairs? Y N  Comment Stairs -  Dressing or bathing? N N  Doing errands, shopping? N N  Some recent data might be hidden    Goals Goals    . Exercise 1x per week     Begin seated and standing exercises with exercise band to increase strength and balance.        Fall Risk Fall Risk  08/29/2020 08/12/2020 02/13/2020 06/11/2019 08/09/2018  Falls in the past year? 0 0 0 0 No  Number falls in past yr: - 0 - - -  Risk for fall due to : No Fall Risks No Fall Risks No Fall Risks - -  Follow up Education provided;Falls prevention discussed Falls evaluation completed Falls prevention discussed Falls evaluation completed -   CDC Handout on Fall Prevention and Handout on Home Exercise Program, Access codes [email or web address] and [email or web address] mailed to patient with exercise band.   Depression Screen PHQ 2/9 Scores 08/29/2020 08/12/2020 02/13/2020 06/11/2019  PHQ - 2 Score 2 3 2 1   PHQ- 9 Score 11 13 8 7      Cognitive Testing Six-Item Cognitive Screener   "I would like to ask you some questions that ask you to use your memory. I am going to name three objects. Please wait until I say all three words, then repeat them. Remember what they are  because I am going to ask you to name them again in a few minutes. Please repeat these words for me: APPLE--TABLE--PENNY." (Interviewer may repeat names 3 times if  necessary but repetition not scored.)  Did patient correctly repeat all three words? Yes - may proceed with screen  What year is this? Correct What month is this? Correct What day of the week is this? Correct  What were the three objects I asked you to remember? . Apple Correct . Table Correct . Penny Correct  Score one point for each incorrect answer.  A score of 2 or more points warrants additional investigation.  Patient's score 0    Assessment and Plan:     Patient had a mammogram at [facility] on 05/14/2020 She will begin seated and standing exercises with exercise band to increase strength and balance.  During the course of the visit the patient was educated and counseled about appropriate screening and preventive services as documented in the assessment and plan.  The printed AVS was given to the patient and included an updated screening schedule, a list of risk factors, and personalized health advice.        Velora Heckler, RN  08/29/2020

## 2020-08-29 NOTE — Progress Notes (Signed)
I discussed the AWV findings with the RN who conducted the visit. I was present in the office suite and immediately available to provide assistance and direction throughout the time the service was provided.  Jeralyn Bennett, MD 08/29/2020, 4:25 PM Pager: [phone]

## 2020-08-29 NOTE — Patient Instructions (Addendum)
Things That May Be Affecting Your Health:  Alcohol  Hearing loss  Pain   x Depression x Home Safety  Sexual Health   Diabetes x Lack of physical activity  Stress  x Difficulty with daily activities  Loneliness  Tiredness   Drug use  Medicines  Tobacco use   Falls  Motor Vehicle Safety  Weight   Food choices  Oral Health  Other    YOUR PERSONALIZED HEALTH PLAN : 1. Schedule your next subsequent Medicare Wellness visit in one year 2. Attend all of your regular appointments to address your medical issues 3. Complete the preventative screenings and services 4. Begin seated and standing exercises with exercise band to increase strength and balance. 5. Consider quitting smoking  Annual Wellness Visit                       Medicare Covered Preventative Screenings and Services  Services & Screenings Men and Women Who How Often Need? Date of Last Service Action  Abdominal Aortic Aneurysm Adults with AAA risk factors Once     Alcohol Misuse and Counseling All Adults Screening once a year if no alcohol misuse. Counseling up to 4 face to face sessions.     Bone Density Measurement  Adults at risk for osteoporosis Once every 2 yrs     Lipid Panel Z13.6 All adults without CV disease Once every 5 yrs     Colorectal Cancer   Stool sample or  Colonoscopy All adults 30 and older   Once every year  Every 10 years     Depression All Adults Once a year x Today   Diabetes Screening Blood glucose, post glucose load, or GTT Z13.1  All adults at risk  Pre-diabetics  Once per year  Twice per year     Diabetes  Self-Management Training All adults Diabetics 10 hrs first year; 2 hours subsequent years. Requires Copay     Glaucoma  Diabetics  Family history of glaucoma  African Americans 5 yrs +  Hispanic Americans 30 yrs + Annually - requires coppay     Hepatitis C Z72.89 or F19.20  High Risk for HCV  Born between 1945 and 1965   Annually  Once     HIV Z11.4 All adults based on risk  Annually btw ages 85 & 65 regardless of risk  Annually > 65 yrs if at increased risk     Lung Cancer Screening Asymptomatic adults aged 29-77 with 30 pack yr history and current smoker OR quit within the last 15 yrs Annually Must have counseling and shared decision making documentation before first screen     Medical Nutrition Therapy Adults with   Diabetes  Renal disease  Kidney transplant within past 3 yrs 3 hours first year; 2 hours subsequent years     Obesity and Counseling All adults Screening once a year Counseling if BMI 30 or higher  Today   Tobacco Use Counseling Adults who use tobacco  Up to 8 visits in one year x    Vaccines Z23  Hepatitis B  Influenza   Pneumonia  Adults   Once  Once every flu season  Two different vaccines separated by one year x    Next Annual Wellness Visit People with Medicare Every year  Today     Services & Screenings Women Who How Often Need  Date of Last Service Action  Mammogram  Z12.31 Women over 23 One baseline ages 90-39. Annually ager 40 yrs+  05/14/2020   Pap tests All women Annually if high risk. Every 2 yrs for normal risk women     Screening for cervical cancer with   Pap (Z01.419 nl or Z01.411abnl) &  HPV Z11.51 Women aged 36 to 66 Once every 5 yrs     Screening pelvic and breast exams All women Annually if high risk. Every 2 yrs for normal risk women     Sexually Transmitted Diseases  Chlamydia  Gonorrhea  Syphilis All at risk adults Annually for non pregnant females at increased risk         [place] Men Who How Ofter Need  Date of Last Service Action  Prostate Cancer - DRE & PSA Men over 50 Annually.  DRE might require a copay.     Sexually Transmitted Diseases  Syphilis All at risk adults Annually for men at increased risk            Fall Prevention in the Home, Adult Falls can  cause injuries. They can happen to people of all ages. There are many things you can do to make your home safe and to help prevent falls. Ask for help when making these changes, if needed. What actions can I take to prevent falls? General Instructions  Use good lighting in all rooms. Replace any light bulbs that burn out.  Turn on the lights when you go into a dark area. Use night-lights.  Keep items that you use often in easy-to-reach places. Lower the shelves around your home if necessary.  Set up your furniture so you have a clear path. Avoid moving your furniture around.  Do not have throw rugs and other things on the floor that can make you trip.  Avoid walking on wet floors.  If any of your floors are uneven, fix them.  Add color or contrast paint or tape to clearly mark and help you see: ? Any grab bars or handrails. ? First and last steps of stairways. ? Where the edge of each step is.  If you use a stepladder: ? Make sure that it is fully opened. Do not climb a closed stepladder. ? Make sure that both sides of the stepladder are locked into place. ? Ask someone to hold the stepladder for you while you use it.  If there are any pets around you, be aware of where they are. What can I do in the bathroom?      Keep the floor dry. Clean up any water that spills onto the floor as soon as it happens.  Remove soap buildup in the tub or shower regularly.  Use non-skid mats or decals on the floor of the tub or shower.  Attach bath mats securely with double-sided, non-slip rug tape.  If you need to sit down in the shower, use a plastic, non-slip stool.  Install grab bars by the toilet and in the tub and shower. Do not use towel bars as grab bars. What can I do in the bedroom?  Make sure that you have a light by your bed that is easy to reach.  Do not use any sheets or blankets that are too big for your bed. They should not hang down onto the floor.  Have a firm chair  that has side arms. You can use this for support while you get dressed. What can I do in the kitchen?  Clean up any spills right away.  If you need to reach something above you, use a  strong step stool that has a grab bar.  Keep electrical cords out of the way.  Do not use floor polish or wax that makes floors slippery. If you must use wax, use non-skid floor wax. What can I do with my stairs?  Do not leave any items on the stairs.  Make sure that you have a light switch at the top of the stairs and the bottom of the stairs. If you do not have them, ask someone to add them for you.  Make sure that there are handrails on both sides of the stairs, and use them. Fix handrails that are broken or loose. Make sure that handrails are as long as the stairways.  Install non-slip stair treads on all stairs in your home.  Avoid having throw rugs at the top or bottom of the stairs. If you do have throw rugs, attach them to the floor with carpet tape.  Choose a carpet that does not hide the edge of the steps on the stairway.  Check any carpeting to make sure that it is firmly attached to the stairs. Fix any carpet that is loose or worn. What can I do on the outside of my home?  Use bright outdoor lighting.  Regularly fix the edges of walkways and driveways and fix any cracks.  Remove anything that might make you trip as you walk through a door, such as a raised step or threshold.  Trim any bushes or trees on the path to your home.  Regularly check to see if handrails are loose or broken. Make sure that both sides of any steps have handrails.  Install guardrails along the edges of any raised decks and porches.  Clear walking paths of anything that might make someone trip, such as tools or rocks.  Have any leaves, snow, or ice cleared regularly.  Use sand or salt on walking paths during winter.  Clean up any spills in your garage right away. This includes grease or oil spills. What  other actions can I take?  Wear shoes that: ? Have a low heel. Do not wear high heels. ? Have rubber bottoms. ? Are comfortable and fit you well. ? Are closed at the toe. Do not wear open-toe sandals.  Use tools that help you move around (mobility aids) if they are needed. These include: ? Canes. ? Walkers. ? Scooters. ? Crutches.  Review your medicines with your doctor. Some medicines can make you feel dizzy. This can increase your chance of falling. Ask your doctor what other things you can do to help prevent falls. Where to find more information  Centers for Disease Control and Prevention, STEADI: https://garcia.biz/  [employer] on Aging: BrainJudge.co.uk Contact a doctor if:  You are afraid of falling at home.  You feel weak, drowsy, or dizzy at home.  You fall at home. Summary  There are many simple things that you can do to make your home safe and to help prevent falls.  Ways to make your home safe include removing tripping hazards and installing grab bars in the bathroom.  Ask for help when making these changes in your home. This information is not intended to replace advice given to you by your health care provider. Make sure you discuss any questions you have with your health care provider. Document Revised: 02/08/2019 Document Reviewed: 06/02/2017 Elsevier Patient Education  2020 [place] Maintenance, Female Adopting a healthy lifestyle and getting preventive care are important in promoting health and  wellness. Ask your health care provider about:  The right schedule for you to have regular tests and exams.  Things you can do on your own to prevent diseases and keep yourself healthy. What should I know about diet, weight, and exercise? Eat a healthy diet   Eat a diet that includes plenty of vegetables, fruits, low-fat dairy products, and lean protein.  Do not eat a lot of foods that are high in solid fats, added sugars, or  sodium. Maintain a healthy weight Body mass index (BMI) is used to identify weight problems. It estimates body fat based on height and weight. Your health care provider can help determine your BMI and help you achieve or maintain a healthy weight. Get regular exercise Get regular exercise. This is one of the most important things you can do for your health. Most adults should:  Exercise for at least 150 minutes each week. The exercise should increase your heart rate and make you sweat (moderate-intensity exercise).  Do strengthening exercises at least twice a week. This is in addition to the moderate-intensity exercise.  Spend less time sitting. Even light physical activity can be beneficial. Watch cholesterol and blood lipids Have your blood tested for lipids and cholesterol at 70 years of age, then have this test every 5 years. Have your cholesterol levels checked more often if:  Your lipid or cholesterol levels are high.  You are older than 70 years of age.  You are at high risk for heart disease. What should I know about cancer screening? Depending on your health history and family history, you may need to have cancer screening at various ages. This may include screening for:  Breast cancer.  Cervical cancer.  Colorectal cancer.  Skin cancer.  Lung cancer. What should I know about heart disease, diabetes, and high blood pressure? Blood pressure and heart disease  High blood pressure causes heart disease and increases the risk of stroke. This is more likely to develop in people who have high blood pressure readings, are of African descent, or are overweight.  Have your blood pressure checked: ? Every 3-5 years if you are 69-86 years of age. ? Every year if you are 31 years old or older. Diabetes Have regular diabetes screenings. This checks your fasting blood sugar level. Have the screening done:  Once every three years after age 66 if you are at a normal weight and have  a low risk for diabetes.  More often and at a younger age if you are overweight or have a high risk for diabetes. What should I know about preventing infection? Hepatitis B If you have a higher risk for hepatitis B, you should be screened for this virus. Talk with your health care provider to find out if you are at risk for hepatitis B infection. Hepatitis C Testing is recommended for:  Everyone born from [birth year] through 1966.  Anyone with known risk factors for hepatitis C. Sexually transmitted infections (STIs)  Get screened for STIs, including gonorrhea and chlamydia, if: ? You are sexually active and are younger than 70 years of age. ? You are older than 70 years of age and your health care provider tells you that you are at risk for this type of infection. ? Your sexual activity has changed since you were last screened, and you are at increased risk for chlamydia or gonorrhea. Ask your health care provider if you are at risk.  Ask your health care provider about whether you are at high  risk for HIV. Your health care provider may recommend a prescription medicine to help prevent HIV infection. If you choose to take medicine to prevent HIV, you should first get tested for HIV. You should then be tested every 3 months for as long as you are taking the medicine. Pregnancy  If you are about to stop having your period (premenopausal) and you may become pregnant, seek counseling before you get pregnant.  Take 400 to 800 micrograms (mcg) of folic acid every day if you become pregnant.  Ask for birth control (contraception) if you want to prevent pregnancy. Osteoporosis and menopause Osteoporosis is a disease in which the bones lose minerals and strength with aging. This can result in bone fractures. If you are 13 years old or older, or if you are at risk for osteoporosis and fractures, ask your health care provider if you should:  Be screened for bone loss.  Take a calcium or vitamin D  supplement to lower your risk of fractures.  Be given hormone replacement therapy (HRT) to treat symptoms of menopause. Follow these instructions at home: Lifestyle  Do not use any products that contain nicotine or tobacco, such as cigarettes, e-cigarettes, and chewing tobacco. If you need help quitting, ask your health care provider.  Do not use street drugs.  Do not share needles.  Ask your health care provider for help if you need support or information about quitting drugs. Alcohol use  Do not drink alcohol if: ? Your health care provider tells you not to drink. ? You are pregnant, may be pregnant, or are planning to become pregnant.  If you drink alcohol: ? Limit how much you use to 0-1 drink a day. ? Limit intake if you are breastfeeding.  Be aware of how much alcohol is in your drink. In the [place] one drink equals one 12 oz bottle of beer (355 mL), one 5 oz glass of wine (148 mL), or one 1 oz glass of hard liquor (44 mL). General instructions  Schedule regular health, dental, and eye exams.  Stay current with your vaccines.  Tell your health care provider if: ? You often feel depressed. ? You have ever been abused or do not feel safe at home. Summary  Adopting a healthy lifestyle and getting preventive care are important in promoting health and wellness.  Follow your health care provider's instructions about healthy diet, exercising, and getting tested or screened for diseases.  Follow your health care provider's instructions on monitoring your cholesterol and blood pressure. This information is not intended to replace advice given to you by your health care provider. Make sure you discuss any questions you have with your health care provider. Document Revised: 10/11/2018 Document Reviewed: 10/11/2018 Elsevier Patient Education  2020 [employer].   Steps to Quit Smoking Smoking tobacco is the leading cause of preventable death. It can affect almost every organ  in the body. Smoking puts you and people around you at risk for many serious, long-lasting (chronic) diseases. Quitting smoking can be hard, but it is one of the best things that you can do for your health. It is never too late to quit. How do I get ready to quit? When you decide to quit smoking, make a plan to help you succeed. Before you quit:  Pick a date to quit. Set a date within the next 2 weeks to give you time to prepare.  Write down the reasons why you are quitting. Keep this list in places where you  will see it often.  Tell your family, friends, and co-workers that you are quitting. Their support is important.  Talk with your doctor about the choices that may help you quit.  Find out if your health insurance will pay for these treatments.  Know the people, places, things, and activities that make you want to smoke (triggers). Avoid them. What first steps can I take to quit smoking?  Throw away all cigarettes at home, at work, and in your car.  Throw away the things that you use when you smoke, such as ashtrays and lighters.  Clean your car. Make sure to empty the ashtray.  Clean your home, including curtains and carpets. What can I do to help me quit smoking? Talk with your doctor about taking medicines and seeing a counselor at the same time. You are more likely to succeed when you do both.  If you are pregnant or breastfeeding, talk with your doctor about counseling or other ways to quit smoking. Do not take medicine to help you quit smoking unless your doctor tells you to do so. To quit smoking: Quit right away  Quit smoking totally, instead of slowly cutting back on how much you smoke over a period of time.  Go to counseling. You are more likely to quit if you go to counseling sessions regularly. Take medicine You may take medicines to help you quit. Some medicines need a prescription, and some you can buy over-the-counter. Some medicines may contain a drug called  nicotine to replace the nicotine in cigarettes. Medicines may:  Help you to stop having the desire to smoke (cravings).  Help to stop the problems that come when you stop smoking (withdrawal symptoms). Your doctor may ask you to use:  Nicotine patches, gum, or lozenges.  Nicotine inhalers or sprays.  Non-nicotine medicine that is taken by mouth. Find resources Find resources and other ways to help you quit smoking and remain smoke-free after you quit. These resources are most helpful when you use them often. They include:  Online chats with a Social worker.  Phone quitlines.  Printed Furniture conservator/restorer.  Support groups or group counseling.  Text messaging programs.  Mobile phone apps. Use apps on your mobile phone or tablet that can help you stick to your quit plan. There are many free apps for mobile phones and tablets as well as websites. Examples include Quit Guide from the [employer] and smokefree.gov  What things can I do to make it easier to quit?   Talk to your family and friends. Ask them to support and encourage you.  Call a phone quitline (1-800-QUIT-NOW), reach out to support groups, or work with a Social worker.  Ask people who smoke to not smoke around you.  Avoid places that make you want to smoke, such as: ? Bars. ? Parties. ? Smoke-break areas at work.  Spend time with people who do not smoke.  Lower the stress in your life. Stress can make you want to smoke. Try these things to help your stress: ? Getting regular exercise. ? Doing deep-breathing exercises. ? Doing yoga. ? Meditating. ? Doing a body scan. To do this, close your eyes, focus on one area of your body at a time from head to toe. Notice which parts of your body are tense. Try to relax the muscles in those areas. How will I feel when I quit smoking? Day 1 to 3 weeks Within the first 24 hours, you may start to have some problems that come from  quitting tobacco. These problems are very bad 2-3 days after  you quit, but they do not often last for more than 2-3 weeks. You may get these symptoms:  Mood swings.  Feeling restless, nervous, angry, or annoyed.  Trouble concentrating.  Dizziness.  Strong desire for high-sugar foods and nicotine.  Weight gain.  Trouble pooping (constipation).  Feeling like you may vomit (nausea).  Coughing or a sore throat.  Changes in how the medicines that you take for other issues work in your body.  Depression.  Trouble sleeping (insomnia). Week 3 and afterward After the first 2-3 weeks of quitting, you may start to notice more positive results, such as:  Better sense of smell and taste.  Less coughing and sore throat.  Slower heart rate.  Lower blood pressure.  Clearer skin.  Better breathing.  Fewer sick days. Quitting smoking can be hard. Do not give up if you fail the first time. Some people need to try a few times before they succeed. Do your best to stick to your quit plan, and talk with your doctor if you have any questions or concerns. Summary  Smoking tobacco is the leading cause of preventable death. Quitting smoking can be hard, but it is one of the best things that you can do for your health.  When you decide to quit smoking, make a plan to help you succeed.  Quit smoking right away, not slowly over a period of time.  When you start quitting, seek help from your doctor, family, or friends. This information is not intended to replace advice given to you by your health care provider. Make sure you discuss any questions you have with your health care provider. Document Revised: 07/13/2019 Document Reviewed: 01/06/2019 Elsevier Patient Education  [place].

## 2020-09-01 NOTE — Progress Notes (Signed)
Internal Medicine Clinic Attending  Case discussed with Dr. Koleen Distance soon after the resident saw the patient.  We reviewed the AWV findings.  I agree with the assessment, diagnosis, and plan of care documented in the AWV note.

## 2020-09-08 NOTE — Progress Notes (Signed)
  [facility] Res[facility] Telephone Encounter Continuity Care Appointment  HPI:   This telephone encounter was created for Ms. RINI MOFFIT on 09/08/2020 for the following purpose/cc follow-up on anxiety.    Past Medical History:  Past Medical History:  Diagnosis Date  . Benign neoplasm of descending colon   . Benign neoplasm of transverse colon   . Bowel obstruction ([place])   . Cancer ([place]) 03/01/2018   rectal cancer=had chemo pills and radation   . Cataract   . Cervix cancer ([place]) 1979  . Depression   . Dyspnea   . 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 Hypoglycemia   . Hyponatremia    syncope with this and admitted twice due to this issue   . Osteopenia   . Osteoporosis   . Rectal bleeding   . Rectal cancer s/p LAR/diverting loop ileostomy 07/12/2018 03/06/2018  . Smoker       ROS:  Review of Systems  Musculoskeletal: Positive for back pain.  Psychiatric/Behavioral: Negative for depression and suicidal ideas. The patient is nervous/anxious.   All other systems reviewed and are negative.      Assessment / Plan / Recommendations:   Please see A&P under problem oriented charting for assessment of the patient's acute and chronic medical conditions.   As always, pt is advised that if symptoms worsen or new symptoms arise, they should go to an urgent care facility or to to ER for further evaluation.   Consent and Medical Decision Making:   Patient seen with Dr. Rebeca Alert  This is a telephone encounter between Keturah Shavers and Andrew Au on 09/08/2020 for anxiety. The visit was conducted with the patient located at home and Andrew Au at [facility]. The patient's identity was confirmed using their DOB and current address. The patient has consented to being evaluated through a telephone encounter and understands the associated risks (an examination cannot be done and the patient may need to come in for an appointment) / benefits (allows the patient to remain at  home, decreasing exposure to coronavirus). I personally spent 15 minutes on medical discussion.

## 2020-09-09 ENCOUNTER — Other Ambulatory Visit: Payer: Self-pay

## 2020-09-09 ENCOUNTER — Encounter: Payer: Self-pay | Admitting: Student

## 2020-09-09 ENCOUNTER — Telehealth: Payer: Self-pay

## 2020-09-09 ENCOUNTER — Ambulatory Visit (INDEPENDENT_AMBULATORY_CARE_PROVIDER_SITE_OTHER): Payer: Medicare Other | Admitting: Student

## 2020-09-09 DIAGNOSIS — F411 Generalized anxiety disorder: Secondary | ICD-10-CM

## 2020-09-09 DIAGNOSIS — Z720 Tobacco use: Secondary | ICD-10-CM | POA: Diagnosis not present

## 2020-09-09 MED ORDER — DULOXETINE HCL 60 MG PO CPEP: 60.0000 mg | ORAL_CAPSULE | Freq: Every day | ORAL | 5 refills | Status: DC

## 2020-09-09 NOTE — Telephone Encounter (Signed)
rtc to pt, she states someone called and message was not clear, she is advised that they will call back if they needed to talk to her, cannot find where anyone called today in chart.

## 2020-09-09 NOTE — Assessment & Plan Note (Signed)
Patient continues to smoke daily.  States she is not ready to quit at this point, citing anxiety from other sources.

## 2020-09-09 NOTE — Assessment & Plan Note (Signed)
Patient has generalized anxiety, attributes largely to difficult home situation. Was referred to CCM previously, but did not feel that they would be helpful for her. Previously tried Lexapro, but made her too sleepy.  Was started on Cymbalta 30 mg on 08/12/2020.  Since that time states that her anxiety is not significantly changed.  Has not had any side effects, except that her current back pain has improved.  Denies depressive symptoms.  -Increase Cymbalta to 60 mg daily -Follow-up in 1 month to reassess anxiety

## 2020-09-09 NOTE — Telephone Encounter (Signed)
Requesting to speak with Dr. Bridgett Larsson, please call pt back.

## 2020-09-09 NOTE — Patient Instructions (Signed)
Thank you for allowing [place] to be a part of your care today, it was a pleasure seeing you. We discussed your anxiety  [place]ac[place] changes: Increase duloxetine to 60 mg daily  You are started on a low-dose of duloxetine at your last visit, and I am glad to see that you are having any significant side effects from it.  We will increase the dose today to 60 mg daily, follow-up in 1 month to see if it has improved your symptoms.  If you experience excessive sleepiness, dizziness, or other side effects, please call the office to let us know.  Please follow up in 1 month   Thank you, and please call the Internal Medicine Clinic at [phone] if you have any questions.  Best, Dr. Bridgett Larsson

## 2020-09-10 NOTE — Progress Notes (Signed)
Internal Medicine Clinic Attending  Case discussed with Dr. Bridgett Larsson at the time of the visit.  We reviewed the resident's history and exam and pertinent patient test results.  I spoke with the patient by phone and agree with the assessment, diagnosis, and plan of care documented in the resident's note.  Lenice Pressman, M.D., Ph.D.

## 2020-11-04 ENCOUNTER — Encounter: Payer: Medicare Other | Admitting: Internal Medicine

## 2020-11-05 ENCOUNTER — Encounter: Payer: Self-pay | Admitting: Internal Medicine

## 2020-11-05 ENCOUNTER — Ambulatory Visit (INDEPENDENT_AMBULATORY_CARE_PROVIDER_SITE_OTHER): Payer: Medicare Other | Admitting: Internal Medicine

## 2020-11-05 VITALS — BP 153/97 | HR 73 | Temp 97.8°F | Ht 61.0 in | Wt 110.6 lb

## 2020-11-05 DIAGNOSIS — M810 Age-related osteoporosis without current pathological fracture: Secondary | ICD-10-CM

## 2020-11-05 DIAGNOSIS — W19XXXA Unspecified fall, initial encounter: Secondary | ICD-10-CM

## 2020-11-05 DIAGNOSIS — F411 Generalized anxiety disorder: Secondary | ICD-10-CM

## 2020-11-05 MED ORDER — MELOXICAM 7.5 MG PO TABS: 7.5000 mg | ORAL_TABLET | Freq: Every day | ORAL | 1 refills | Status: AC

## 2020-11-05 MED ORDER — CYCLOBENZAPRINE HCL 5 MG PO TABS
5.0000 mg | ORAL_TABLET | Freq: Three times a day (TID) | ORAL | 0 refills | Status: DC | PRN
Start: 2020-11-05 — End: 2021-09-16

## 2020-11-05 NOTE — Patient Instructions (Addendum)
Ms. Bebeau, I'm sorry to hear about your falls. I want to get some imaging done of your neck and hips. Please return as soon as possible to have these done.   I will also send in prescriptions for an anti-inflammatory and muscle relaxant to help your muscles around your neck that have been strained.   Please call if you cannot come back to the hospital to get these done and I will arrange for them to be done at a location of your convenience.   Take care and feel better, Dr. Chesley Mires

## 2020-11-05 NOTE — Progress Notes (Signed)
Acute Office Visit  Subjective:    Patient ID: Deborah Arroyo, female    DOB: Dec 10, 1950, 70 y.o.   MRN: [id number]  Chief Complaint  Patient presents with  . Fall    Fell x 2 .  Headaches  Follow up     HPI Patient is in today for follow-up on GAD and acute concern of headaches, neck and hip pain following 2 separate falls. Please see problem based charting for further details.   Past Medical History:  Diagnosis Date  . Benign neoplasm of descending colon   . Benign neoplasm of transverse colon   . Bowel obstruction (HCC)   . Cancer (HCC) 03/01/2018   rectal cancer=had chemo pills and radation   . Cataract   . Cervix cancer (HCC) 1979  . Depression   . Dyspnea   . 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 Hypoglycemia   . Hyponatremia    syncope with this and admitted twice due to this issue   . Osteopenia   . Osteoporosis   . Rectal bleeding   . Rectal cancer s/p LAR/diverting loop ileostomy 07/12/2018 03/06/2018  . Smoker     Past Surgical History:  Procedure Laterality Date  . ABDOMINAL HYSTERECTOMY    . APPENDECTOMY    . BIOPSY  07/04/2018   Procedure: BIOPSY;  Surgeon: Beverley Fiedler, MD;  Location: [facility];  Service: Gastroenterology;;  . CHOLECYSTECTOMY    . COLONOSCOPY  03/01/2018   Dr.Pyrtle  . COLONOSCOPY WITH PROPOFOL N/A 07/04/2018   Procedure: COLONOSCOPY WITH PROPOFOL;  Surgeon: Beverley Fiedler, MD;  Location: [facility];  Service: Gastroenterology;  Laterality: N/A;  . CYSTOSCOPY WITH STENT PLACEMENT Bilateral 07/12/2018   Procedure: CYSTOSCOPY WITH STENT PLACEMENT;  Surgeon: Crista Elliot, MD;  Location: [facility];  Service: Urology;  Laterality: Bilateral;  . DIVERTING ILEOSTOMY N/A 07/12/2018   Procedure: DIVERTING LOOP ILEOSTOMY;  Surgeon: Andria Meuse, MD;  Location: [facility];  Service: General;  Laterality: N/A;  . EYE SURGERY    . FLEXIBLE SIGMOIDOSCOPY N/A 07/12/2018   Procedure: FLEXIBLE SIGMOIDOSCOPY;  Surgeon: Andria Meuse,  MD;  Location: [facility];  Service: General;  Laterality: N/A;  . FLEXIBLE SIGMOIDOSCOPY N/A 12/04/2018   Procedure: FLEXIBLE SIGMOIDOSCOPY;  Surgeon: Andria Meuse, MD;  Location: [facility];  Service: General;  Laterality: N/A;  . ILEO LOOP COLOSTOMY CLOSURE N/A 04/20/2019   Procedure: OPEN TAKEDOWN OF ILEOSTOMY, primary repair of parastomal hernia;  Surgeon: Andria Meuse, MD;  Location: [facility];  Service: General;  Laterality: N/A;  . LAPAROSCOPIC LOW ANTERIOR RESECTION N/A 07/12/2018   Procedure: LAPAROSCOPIC LOW ANTERIOR RESECTION WITH COLOPROSTOSTOMY, ERAS PATHWAY;  Surgeon: Andria Meuse, MD;  Location: [facility];  Service: General;  Laterality: N/A;  . POLYPECTOMY  07/04/2018   Procedure: POLYPECTOMY;  Surgeon: Beverley Fiedler, MD;  Location: [facility];  Service: Gastroenterology;;  . SHOULDER SURGERY Left    Rotary Cuff Tear  . TUBAL LIGATION      Family History  Problem Relation Age of Onset  . Stomach cancer Sister   . Ovarian cancer Sister   . Cancer - Ovarian Sister   . Dementia Mother   . Uterine cancer Mother   . Endometrial cancer Mother   . Prostate cancer Brother   . Cancer - Prostate Brother   . Cancer Other   . Cancer Brother        lung/brain  . Breast cancer Daughter   .  Alzheimer's disease Father   . Colon cancer Neg Hx   . Esophageal cancer Neg Hx   . Pancreatic cancer Neg Hx   . Rectal cancer Neg Hx   . Colon polyps Neg Hx     Social History   Socioeconomic History  . Marital status: Divorced    Spouse name: Not on file  . Number of children: 3  . Years of education: Not on file  . Highest education level: 8th grade  Occupational History  . Occupation: Retired    Fish farm manager: [employer]  Tobacco Use  . Smoking status: Current Every Day Smoker    Packs/day: 1.50    Types: Cigarettes  . Smokeless tobacco: Never Used  . Tobacco comment: 1.5 PPD  Vaping Use  . Vaping Use: Never used  Substance and Sexual Activity  . Alcohol use:  No    Alcohol/week: 0.0 standard drinks  . Drug use: No  . Sexual activity: Not Currently  Other Topics Concern  . Not on file  Social History Narrative   Divorced, lives with 2 sons and 1 grandchild, I dog. She drinks 3-4 cups of coffee a day. Is very active around the house. Has 3 brothers and 6 sisters.      Current Social History 08/29/2020        Patient lives with 2 sons and 1 grandson in a two level home. There are 2 steps without handrails up to the entrance the patient uses.       Patient's method of transportation is via friend's car at present.      The highest level of education was 8 th grade      The patient currently retired from [employer].      Identified important Relationships are: "My daughter, my sister."       Pets : A miniature shepherd named CJ       Interests / Fun: "Listen to TV, read a book."       Current Stressors: "Where I live, my car (broken down)"       Religious / Personal Beliefs: None       L. [place], BSN, RN-BC        Social Determinants of Health   Financial Resource Strain: Not on file  Food Insecurity: Not on file  Transportation Needs: Not on file  Physical Activity: Not on file  Stress: Not on file  Social Connections: Not on file  Intimate Partner Violence: Not on file    Outpatient Medications Prior to Visit  Medication Sig Dispense Refill  . acetaminophen (TYLENOL) 325 MG tablet Take 2 tablets (650 mg total) by mouth every 6 (six) hours as needed for mild pain, fever or headache (or Fever >/= 101). 12 tablet 1  . alendronate (FOSAMAX) 70 MG tablet Take 1 tablet (70 mg total) by mouth once a week. Take with a full glass of water on an empty stomach. 51 tablet 0  . calcium carbonate (CALTRATE 600) 1500 (600 Ca) MG TABS tablet Take 1 tablet (1,500 mg total) by mouth 2 (two) times daily with a meal. 30 tablet 11  . DULoxetine (CYMBALTA) 60 MG capsule Take 1 capsule (60 mg total) by mouth daily. 30 capsule 5  . Loperamide HCl  (IMODIUM A-D PO) Take 2-4 mg by mouth as needed.     No facility-administered medications prior to visit.    Allergies  Allergen Reactions  . Aleve [Naproxen Sodium] Swelling and Other (See Comments)  Eyes and throat swell up    Review of Systems  Constitutional: Negative for chills, fever and unexpected weight change.  HENT: Negative for hearing loss, tinnitus and trouble swallowing.   Eyes: Negative for photophobia and visual disturbance.  Respiratory: Negative for shortness of breath.   Cardiovascular: Negative for chest pain and palpitations.  Gastrointestinal: Negative for abdominal pain, nausea and vomiting.  Genitourinary: Negative for difficulty urinating.  Musculoskeletal: Positive for arthralgias and neck pain.  Skin: Negative for wound.  Neurological: Positive for headaches. Negative for syncope, weakness and numbness.  Psychiatric/Behavioral: Positive for sleep disturbance.       Objective:    Physical Exam Constitutional:      General: She is not in acute distress.    Appearance: Normal appearance.  HENT:     Head: Normocephalic and atraumatic.  Eyes:     Extraocular Movements: Extraocular movements intact.     Pupils: Pupils are equal, round, and reactive to light.  Cardiovascular:     Rate and Rhythm: Normal rate and regular rhythm.     Pulses: Normal pulses.  Pulmonary:     Effort: Pulmonary effort is normal.     Breath sounds: Normal breath sounds.  Abdominal:     General: There is no distension.     Palpations: Abdomen is soft.  Musculoskeletal:     Cervical back: Spinous process tenderness and muscular tenderness present. Decreased range of motion.     Right hip: Tenderness and bony tenderness present. Decreased range of motion.     Left hip: Tenderness and bony tenderness present. Decreased range of motion.     Comments: Negative log roll. Significant pain bilaterally, R>L, with external rotation.  TTP over coccyx.  She has positive  trendelenburg bilaterally.   Neurological:     Mental Status: She is alert and oriented to person, place, and time.     Cranial Nerves: Cranial nerves are intact.     Sensory: Sensation is intact.     Motor: Motor function is intact.     Coordination: Romberg sign positive.     BP (!) 153/97 (BP Location: Right Arm, Cuff Size: Small)   Pulse 73   Temp 97.8 F (36.6 C) (Oral)   Ht 5\' 1"  (1.549 m)   Wt 110 lb 9.6 oz (50.2 kg)   SpO2 96% Comment: room air  BMI 20.90 kg/m  Wt Readings from Last 3 Encounters:  11/05/20 110 lb 9.6 oz (50.2 kg)  08/12/20 110 lb 6.4 oz (50.1 kg)  05/26/20 110 lb 3.2 oz (50 kg)    Health Maintenance Due  Topic Date Due  . TETANUS/TDAP  Never done  . PNA vac Low Risk Adult (1 of 2 - PCV13) Never done  . MAMMOGRAM  11/17/2017  . COVID-19 Vaccine (3 - Pfizer risk 4-dose series) 03/01/2020    There are no preventive care reminders to display for this patient.  Lab Results  Component Value Date   WBC 14.3 (H) 06/20/2019   HGB 13.7 06/20/2019   HCT 41.2 06/20/2019   MCV 95.4 06/20/2019   PLT 330 06/20/2019   Lab Results  Component Value Date   NA 136 05/22/2020   K 4.6 05/22/2020   CO2 27 05/22/2020   GLUCOSE 81 05/22/2020   BUN 19 05/22/2020   CREATININE 0.97 05/22/2020   BILITOT 0.6 06/20/2019   ALKPHOS 92 06/20/2019   AST 18 06/20/2019   ALT 11 06/20/2019   PROT 6.7 06/20/2019   ALBUMIN 3.9 06/20/2019  CALCIUM 9.8 05/22/2020   ANIONGAP 10 05/22/2020   GFR 94.76 03/01/2018   Lab Results  Component Value Date   CHOL 190 09/04/2015   Lab Results  Component Value Date   HDL 65 09/04/2015   Lab Results  Component Value Date   LDLCALC 97 09/04/2015   Lab Results  Component Value Date   TRIG 142 09/04/2015   Lab Results  Component Value Date   CHOLHDL 2.9 09/04/2015   Lab Results  Component Value Date   HGBA1C 5.6 04/17/2019       Assessment & Plan:   Problem List Items Addressed This Visit      Musculoskeletal  and Integument   Osteoporosis - Primary (Chronic)   Relevant Orders   Vitamin D (25 hydroxy) (Completed)   DG Hip Unilat W OR W/O Pelvis Min 4 Views Left   DG Hip Unilat W OR W/O Pelvis Min 4 Views Right   DG Pelvis 1-2 Views   DG Cervical Spine Complete     Other   GAD (generalized anxiety disorder)    Ms. Aujla has noticed some improvement in her mood and anxiety since increasing the dose of her Cymbalta to 60 mg. Unfortunately with her acute issues surrounding her falls we were not able to obtain a GAD-7 today, but will have her complete one at her next visit.  Continue Cymbalta 60 mg daily.       Fall    Patient presents after two separate falls. The first was 12/2 where she tripped and fell over her laundry basket. Notes she fell onto her left side, striking her head. No LOC. The second fall was on 12/16 in which she fell out of bed due to a nightmare. She unfortunately struck her head again, as well as fell awkwardly sustaining injuries to her neck and hips. No LOC after this fall either.  Since the most recent fall, she has had issues with daily headaches, neck pain and hip pain that makes it challenging for her to ambulate or get out of bed. She denies any light sensitivity, nausea, vomiting, of difficulty concentrating.  On exam, she does have a positive romberg. This along with her symptoms indicates a post-concussive syndrome.  Regarding her musculoskeletal pains, her history of osteoporosis makes the possibility of fractures much more likely, particularly with boney tenderness to palpation of her cervical spine and sacral region.  She also has fairly significant TTP of paraspinal cervical muscles consistent with muscle spasms.   Plan -obtain films of cervical spine, pelvis and bilateral hips -if hip and pelvic x-rays are negative, will obtain a CT scan to rule out any possibility of a missed fracture  -treat acute pain with meloxicam and flexeril. Also encouraged intermittent  heat throughout the day for neck spasms       Relevant Orders   DG Hip Unilat W OR W/O Pelvis Min 4 Views Left   DG Hip Unilat W OR W/O Pelvis Min 4 Views Right   DG Pelvis 1-2 Views   DG Cervical Spine Complete       Meds ordered this encounter  Medications  . meloxicam (MOBIC) 7.5 MG tablet    Sig: Take 1-2 tablets (7.5-15 mg total) by mouth daily.    Dispense:  30 tablet    Refill:  1  . cyclobenzaprine (FLEXERIL) 5 MG tablet    Sig: Take 1 tablet (5 mg total) by mouth 3 (three) times daily as needed for muscle spasms.  Dispense:  30 tablet    Refill:  0     Kinsler Soeder D Julita Ozbun, DO

## 2020-11-06 ENCOUNTER — Encounter: Payer: Self-pay | Admitting: Internal Medicine

## 2020-11-06 DIAGNOSIS — W19XXXA Unspecified fall, initial encounter: Secondary | ICD-10-CM | POA: Insufficient documentation

## 2020-11-06 LAB — VITAMIN D 25 HYDROXY (VIT D DEFICIENCY, FRACTURES): Vit D, 25-Hydroxy: 24.5 ng/mL — ABNORMAL LOW (ref 30.0–100.0)

## 2020-11-06 MED ORDER — VITAMIN D3 10 MCG (400 UNIT) PO TABS: 800.0000 [IU] | ORAL_TABLET | Freq: Every day | ORAL | 2 refills | Status: DC

## 2020-11-06 NOTE — Assessment & Plan Note (Signed)
Compliant with alendronate therapy. Vitamin D level checked today which is low.  Will start supplementation with 800 units daily and re-check level in 3-4 months to ensure adequate response.

## 2020-11-06 NOTE — Assessment & Plan Note (Signed)
Patient presents after two separate falls. The first was 12/2 where she tripped and fell over her laundry basket. Notes she fell onto her left side, striking her head. No LOC. The second fall was on 12/16 in which she fell out of bed due to a nightmare. She unfortunately struck her head again, as well as fell awkwardly sustaining injuries to her neck and hips. No LOC after this fall either.  Since the most recent fall, she has had issues with daily headaches, neck pain and hip pain that makes it challenging for her to ambulate or get out of bed. She denies any light sensitivity, nausea, vomiting, of difficulty concentrating.  On exam, she does have a positive romberg. This along with her symptoms indicates a post-concussive syndrome.  Regarding her musculoskeletal pains, her history of osteoporosis makes the possibility of fractures much more likely, particularly with boney tenderness to palpation of her cervical spine and sacral region.  She also has fairly significant TTP of paraspinal cervical muscles consistent with muscle spasms.   Plan -obtain films of cervical spine, pelvis and bilateral hips -if hip and pelvic x-rays are negative, will obtain a CT scan to rule out any possibility of a missed fracture  -treat acute pain with meloxicam and flexeril. Also encouraged intermittent heat throughout the day for neck spasms

## 2020-11-06 NOTE — Assessment & Plan Note (Signed)
[name] has noticed some improvement in her mood and anxiety since increasing the dose of her Cymbalta to 60 mg. Unfortunately with her acute issues surrounding her falls we were not able to obtain a GAD-7 today, but will have her complete one at her next visit.  Continue Cymbalta 60 mg daily.

## 2020-11-07 NOTE — Progress Notes (Signed)
Internal Medicine Clinic Attending  Case discussed with Dr. Koleen Distance  At the time of the visit.  We reviewed the resident's history and exam and pertinent patient test results.  I agree with the assessment, diagnosis, and plan of care documented in the resident's note.  The muscle relaxant use must be done with caution given her recent propensity for falls.  She is at high risk of fracture given existing diagnosis of osteoporosis (though bisphosphonate should mitigate risk to some extent) and xrays are appropriate.

## 2020-11-10 ENCOUNTER — Other Ambulatory Visit: Payer: Self-pay

## 2020-11-10 ENCOUNTER — Inpatient Hospital Stay: Payer: Medicare Other

## 2020-11-10 ENCOUNTER — Inpatient Hospital Stay (HOSPITAL_BASED_OUTPATIENT_CLINIC_OR_DEPARTMENT_OTHER): Payer: Medicare Other | Admitting: Oncology

## 2020-11-10 ENCOUNTER — Inpatient Hospital Stay: Payer: Medicare Other | Attending: Oncology

## 2020-11-10 ENCOUNTER — Telehealth: Payer: Self-pay

## 2020-11-10 ENCOUNTER — Telehealth: Payer: Self-pay | Admitting: Oncology

## 2020-11-10 VITALS — BP 130/78 | HR 77 | Temp 97.6°F | Resp 15 | Wt 111.3 lb

## 2020-11-10 DIAGNOSIS — Z23 Encounter for immunization: Secondary | ICD-10-CM | POA: Diagnosis not present

## 2020-11-10 DIAGNOSIS — R32 Unspecified urinary incontinence: Secondary | ICD-10-CM | POA: Insufficient documentation

## 2020-11-10 DIAGNOSIS — F17210 Nicotine dependence, cigarettes, uncomplicated: Secondary | ICD-10-CM | POA: Diagnosis not present

## 2020-11-10 DIAGNOSIS — J449 Chronic obstructive pulmonary disease, unspecified: Secondary | ICD-10-CM | POA: Insufficient documentation

## 2020-11-10 DIAGNOSIS — C20 Malignant neoplasm of rectum: Secondary | ICD-10-CM

## 2020-11-10 LAB — CEA (IN HOUSE-CHCC): CEA (CHCC-In House): 5.09 ng/mL — ABNORMAL HIGH (ref 0.00–5.00)

## 2020-11-10 NOTE — Telephone Encounter (Signed)
Orders were for x-rays of pelvis, spine, and hips. Call placed to [facility] in radiology. States patient was not at their Dept today. Call placed to patient. Patient states she was at [facility] and person at desk told her the x-rays would not tell her what was wrong with her neck. Explained to patient the cervical spine x-ray is of her neck. Returned call to [place]. States the x-rays were not showing up on her list but she can access them through patient's chart. Patient will return for x-rays tomorrow. Hubbard Hartshorn, BSN, RN-BC

## 2020-11-10 NOTE — Progress Notes (Signed)
  [facility] OFFICE PROGRESS NOTE   Diagnosis: Rectal cancer  INTERVAL HISTORY:   Deborah Arroyo returns for scheduled visit.  She feels well.  She has 0-3 bowel movements per day.  She continues to have some difficulty with incontinence.  No other complaint.  She smokes 1 pack of cigarettes per day.  Objective:  Vital signs in last 24 hours:  There were no vitals taken for this visit.    Lymphatics: No cervical, supraclavicular, or inguinal nodes.  "Shotty "bilateral axillary nodes Resp: Distant breath sounds, clear bilaterally Cardio: Regular rate and rhythm GI: No mass, no hepatosplenomegaly, mild tenderness in the right upper abdomen Vascular: No leg edema     Lab Results:  Lab Results  Component Value Date   WBC 14.3 (H) 06/20/2019   HGB 13.7 06/20/2019   HCT 41.2 06/20/2019   MCV 95.4 06/20/2019   PLT 330 06/20/2019   NEUTROABS 8.1 (H) 04/23/2019    CMP  Lab Results  Component Value Date   NA 136 05/22/2020   K 4.6 05/22/2020   CL 99 05/22/2020   CO2 27 05/22/2020   GLUCOSE 81 05/22/2020   BUN 19 05/22/2020   CREATININE 0.97 05/22/2020   CALCIUM 9.8 05/22/2020   PROT 6.7 06/20/2019   ALBUMIN 3.9 06/20/2019   AST 18 06/20/2019   ALT 11 06/20/2019   ALKPHOS 92 06/20/2019   BILITOT 0.6 06/20/2019   GFRNONAA 60 (L) 05/22/2020   GFRAA >60 05/22/2020    Lab Results  Component Value Date   CEA1 3.83 05/22/2020    Medications: I have reviewed the patient's current medications.   Assessment/Plan: 1. Rectal cancer, obstructing rectal mass on colonoscopy 03/01/2018-biopsy confirmed adenocarcinoma ? Incomplete colonoscopy secondary to obstructing tumor ? Staging CTs on 03/03/2018-small mediastinal/hilar lymph nodes, indeterminate lingula nodule, rectal mass, small perirectal lymph nodes, mass measured at approximately 4.9 cm from the anal verge ? MRI pelvis 03/11/2018-T3c,N2 ? Radiation/Xeloda 04/03/2018-05/11/2018 ? Colonoscopy 07/04/2018- mass at  8 cm from the dentate line, polypsremoved from the hepatic flexure and descending colon-tubular adenomas ? Low anterior resection/ileostomy 07/12/2018, T3N0 moderately differentiated adenocarcinoma, margins negative, 11- lymph nodes, no lymphovascular or perineural invasion, MSI-stable, no loss of mismatch repair protein expression ? Cycle 1 adjuvant Xeloda 08/07/2018 ? Cycle 2 adjuvant Xeloda 08/28/2018 ? Cycle 3 adjuvant Xeloda 09/18/2018 ? Cycle 4 adjuvant Xeloda 10/09/2018(Xeloda held 10/13/2018 pm dose, 10/14/2018 and 10/15/2018; resumed 10/16/2018 at a reduced dose of 1000 mg every morning and 500 mg every afternoon for the remainder of the cycle) ? Cycle 5 adjuvant Xeloda 10/30/2018 ? CTs 01/04/2019- low anterior resection with a right lower quadrant ileostomy, no evidence of metastatic disease ? Takedown of diverting loop ileostomy and closure of parastomal hernia 04/20/2019 ? Surveillance colonoscopy 07/19/2019-2 mm polyp in the ascending colon (sessile serrated polyp without cytologic dysplasia), next colonoscopy at a 3-year interval ? CTs 06/04/2020- no evidence of metastatic disease, centrilobular emphysema 2. Tobacco use 3. COPD 4. Family history of multiple cancers      Disposition: Deborah Arroyo is in clinical remission from rectal cancer.  We will follow up on the CEA from today.  She will return for an office visit and restaging CTs in 6 months.  She received a COVID-19 booster vaccine today.  Betsy Coder, MD  11/10/2020  12:29 PM

## 2020-11-10 NOTE — Telephone Encounter (Signed)
Scheduled appointments per 1/10 los. Spoke to patient who is aware of appointments dates and times.  

## 2020-11-10 NOTE — Progress Notes (Signed)
   Covid-19 Vaccination Clinic  Name:  Deborah Arroyo    MRN: [id number] DOB: 04/19/1951  11/10/2020  [name] was observed post Covid-19 immunization for 15 minutes without incident. She was provided with Vaccine Information Sheet and instruction to access the V-Safe system.   [name] was instructed to call 911 with any severe reactions post vaccine: Marland Kitchen Difficulty breathing  . Swelling of face and throat  . A fast heartbeat  . A bad rash all over body  . Dizziness and weakness   Immunizations Administered    Name Date Dose VIS Date Route   Pfizer COVID-19 Vaccine 11/10/2020 12:30 PM 0.3 mL 08/20/2020 Intramuscular   Manufacturer: [place]   Lot: [id number]   NDC: [address]-1

## 2020-11-10 NOTE — Telephone Encounter (Signed)
Pt called requesting a call back .. pt state she went to do the DX testing that Dr had requested but the tech told her she did not what exactly was to be done

## 2020-11-11 ENCOUNTER — Telehealth: Payer: Self-pay

## 2020-11-11 DIAGNOSIS — C20 Malignant neoplasm of rectum: Secondary | ICD-10-CM

## 2020-11-11 NOTE — Telephone Encounter (Signed)
-----   Message from Ladell Pier, MD sent at 11/10/2020  1:23 PM EST ----- Please call patient, the CEA is mildly elevated, likely secondary to smoking, repeat in 1 month, encouraged her to discontinue smoking

## 2020-11-11 NOTE — Telephone Encounter (Signed)
Patient contacted with results of CEA, slightly elevated. (5.0 / prior 3.83)  Pt notified will repeat in 1 month. Pt encouraged to quit/decrease smoking as that can affect CEA value. Pt verbalized understanding. Scheduling message sent.

## 2020-11-12 ENCOUNTER — Other Ambulatory Visit: Payer: Self-pay | Admitting: Internal Medicine

## 2020-11-12 ENCOUNTER — Ambulatory Visit (HOSPITAL_COMMUNITY)
Admission: RE | Admit: 2020-11-12 | Discharge: 2020-11-12 | Disposition: A | Discharge status: Home / Self Care | Payer: Medicare Other | Source: Ambulatory Visit | Attending: Internal Medicine | Admitting: Internal Medicine

## 2020-11-12 ENCOUNTER — Other Ambulatory Visit: Payer: Self-pay

## 2020-11-12 ENCOUNTER — Telehealth: Payer: Self-pay | Admitting: Oncology

## 2020-11-12 ENCOUNTER — Telehealth: Payer: Self-pay | Admitting: Internal Medicine

## 2020-11-12 DIAGNOSIS — M810 Age-related osteoporosis without current pathological fracture: Secondary | ICD-10-CM

## 2020-11-12 DIAGNOSIS — M47812 Spondylosis without myelopathy or radiculopathy, cervical region: Secondary | ICD-10-CM | POA: Diagnosis not present

## 2020-11-12 DIAGNOSIS — M542 Cervicalgia: Secondary | ICD-10-CM | POA: Insufficient documentation

## 2020-11-12 DIAGNOSIS — W19XXXA Unspecified fall, initial encounter: Secondary | ICD-10-CM

## 2020-11-12 DIAGNOSIS — M25559 Pain in unspecified hip: Secondary | ICD-10-CM | POA: Diagnosis not present

## 2020-11-12 NOTE — Telephone Encounter (Signed)
Patient just walked into clinic today after having X-rays done.  Would like you to give her a call as soon as you get the test results at [phone].

## 2020-11-12 NOTE — Telephone Encounter (Signed)
Yes I will call her as soon as the scans have been read. Thank you!

## 2020-11-12 NOTE — Telephone Encounter (Signed)
Scheduled appt per 11/1 sch msg - left message for patient with appt date and time   

## 2020-11-13 ENCOUNTER — Telehealth: Payer: Self-pay

## 2020-11-13 ENCOUNTER — Other Ambulatory Visit: Payer: Self-pay | Admitting: Internal Medicine

## 2020-11-13 DIAGNOSIS — W19XXXA Unspecified fall, initial encounter: Secondary | ICD-10-CM

## 2020-11-13 NOTE — Telephone Encounter (Signed)
Requesting X-ray test results, please call pt back.

## 2020-11-13 NOTE — Telephone Encounter (Signed)
Called patient to inform her that x-ray findings were negative. Given her persistent pain and history of osteoporosis, need to proceed with CT pelvis to further evaluate.

## 2020-11-19 ENCOUNTER — Telehealth: Payer: Self-pay

## 2020-11-19 NOTE — Telephone Encounter (Signed)
Returned pt's call. Confirmed pt's lab appointment on 12/12/20.

## 2020-11-24 ENCOUNTER — Ambulatory Visit (INDEPENDENT_AMBULATORY_CARE_PROVIDER_SITE_OTHER): Payer: Medicare Other | Admitting: Internal Medicine

## 2020-11-24 ENCOUNTER — Other Ambulatory Visit: Payer: Self-pay

## 2020-11-24 DIAGNOSIS — W19XXXA Unspecified fall, initial encounter: Secondary | ICD-10-CM | POA: Diagnosis not present

## 2020-11-24 DIAGNOSIS — F0781 Postconcussional syndrome: Secondary | ICD-10-CM | POA: Diagnosis not present

## 2020-11-24 NOTE — Progress Notes (Signed)
  [facility] Internal Medicine Residency Telephone Encounter Continuity Care Appointment  HPI:   This telephone encounter was created for [name] on 11/24/2020 for the following purpose/cc headaches, issue with scheduled CT.   Past Medical History:  Past Medical History:  Diagnosis Date  . Benign neoplasm of descending colon   . Benign neoplasm of transverse colon   . Bowel obstruction ([place])   . Cancer ([place]) 03/01/2018   rectal cancer=had chemo pills and radation   . Cataract   . Cervix cancer ([place]) 1979  . Depression   . Dyspnea   . 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 Hypoglycemia   . Hyponatremia    syncope with this and admitted twice due to this issue   . Osteopenia   . Osteoporosis   . Rectal bleeding   . Rectal cancer s/p LAR/diverting loop ileostomy 07/12/2018 03/06/2018  . Smoker       ROS:   Negative for syncope, focal numbness/tingling, weakness, vision changes    Assessment / Plan / Recommendations:   Please see A&P under problem oriented charting for assessment of the patient's acute and chronic medical conditions.   As always, pt is advised that if symptoms worsen or new symptoms arise, they should go to an urgent care facility or to to ER for further evaluation.   Consent and Medical Decision Making:   Patient discussed with Dr. Daryll Drown  This is a telephone encounter between Deborah Arroyo and Delice Bison on 11/24/2020 for headaches, issue with scheduled CT scan. The visit was conducted with the patient located at home and Delice Bison at [facility]. The patient's identity was confirmed using their DOB and current address. The patient has consented to being evaluated through a telephone encounter and understands the associated risks (an examination cannot be done and the patient may need to come in for an appointment) / benefits (allows the patient to remain at home, decreasing exposure to coronavirus). I personally spent 10 minutes on  medical discussion.

## 2020-11-25 ENCOUNTER — Ambulatory Visit (HOSPITAL_BASED_OUTPATIENT_CLINIC_OR_DEPARTMENT_OTHER): Admission: RE | Admit: 2020-11-25 | Payer: Medicare Other | Source: Ambulatory Visit

## 2020-11-26 ENCOUNTER — Encounter: Payer: Self-pay | Admitting: Internal Medicine

## 2020-11-26 NOTE — Assessment & Plan Note (Signed)
Since 2 separate falls in December, Deborah Arroyo has had continued pain in her pelvic region, as well as continued headaches and fatigue.  Her plain films of her hips and neck were negative. Given her history of osteoporosis, we are obtaining a CT abd/pelvis to rule out a fracture. She was upset with this being scheduled in [facility] so I assured her I would send a message to try to have it changed.  I will also obtain a CT head given her persistent headaches since it has been approximately 6 weeks since the incident.

## 2020-11-28 NOTE — Progress Notes (Signed)
Internal Medicine Clinic Attending  Case discussed with Dr. Bloomfield  At the time of the visit.  We reviewed the resident's history and pertinent patient test results.  I agree with the assessment, diagnosis, and plan of care documented in the resident's note.  

## 2020-12-04 ENCOUNTER — Ambulatory Visit (HOSPITAL_COMMUNITY): Payer: Medicare Other

## 2020-12-04 ENCOUNTER — Ambulatory Visit (HOSPITAL_COMMUNITY)
Admission: RE | Admit: 2020-12-04 | Discharge: 2020-12-04 | Disposition: A | Discharge status: Home / Self Care | Payer: Medicare Other | Source: Ambulatory Visit | Attending: Internal Medicine | Admitting: Internal Medicine

## 2020-12-04 ENCOUNTER — Other Ambulatory Visit: Payer: Self-pay

## 2020-12-04 DIAGNOSIS — J439 Emphysema, unspecified: Secondary | ICD-10-CM | POA: Insufficient documentation

## 2020-12-04 DIAGNOSIS — F0781 Postconcussional syndrome: Secondary | ICD-10-CM | POA: Diagnosis present

## 2020-12-04 DIAGNOSIS — M25551 Pain in right hip: Secondary | ICD-10-CM | POA: Diagnosis not present

## 2020-12-04 DIAGNOSIS — W19XXXA Unspecified fall, initial encounter: Secondary | ICD-10-CM | POA: Insufficient documentation

## 2020-12-04 DIAGNOSIS — I708 Atherosclerosis of other arteries: Secondary | ICD-10-CM | POA: Insufficient documentation

## 2020-12-04 DIAGNOSIS — M25559 Pain in unspecified hip: Secondary | ICD-10-CM | POA: Diagnosis not present

## 2020-12-04 DIAGNOSIS — Z85048 Personal history of other malignant neoplasm of rectum, rectosigmoid junction, and anus: Secondary | ICD-10-CM | POA: Diagnosis not present

## 2020-12-04 DIAGNOSIS — R519 Headache, unspecified: Secondary | ICD-10-CM | POA: Diagnosis not present

## 2020-12-04 DIAGNOSIS — I700 Atherosclerosis of aorta: Secondary | ICD-10-CM | POA: Diagnosis not present

## 2020-12-04 DIAGNOSIS — R102 Pelvic and perineal pain: Secondary | ICD-10-CM | POA: Diagnosis not present

## 2020-12-04 DIAGNOSIS — Z9889 Other specified postprocedural states: Secondary | ICD-10-CM | POA: Diagnosis not present

## 2020-12-05 ENCOUNTER — Telehealth: Payer: Self-pay

## 2020-12-05 NOTE — Telephone Encounter (Signed)
I called patient and informed her of the results of her CT head and CT abd/pelvis. Will let the ordering provider discuss with the patient what her next management steps are.

## 2020-12-05 NOTE — Telephone Encounter (Signed)
Requesting CT scan test results, please call pt back.  °

## 2020-12-08 NOTE — Telephone Encounter (Signed)
Pt is calling regarding results, pls contact [phone]

## 2020-12-12 ENCOUNTER — Inpatient Hospital Stay: Payer: Medicare Other | Attending: Nurse Practitioner

## 2020-12-12 ENCOUNTER — Other Ambulatory Visit: Payer: Self-pay

## 2020-12-12 DIAGNOSIS — C20 Malignant neoplasm of rectum: Secondary | ICD-10-CM | POA: Diagnosis not present

## 2020-12-12 LAB — CEA (IN HOUSE-CHCC): CEA (CHCC-In House): 4.52 ng/mL (ref 0.00–5.00)

## 2020-12-15 ENCOUNTER — Telehealth: Payer: Self-pay

## 2020-12-15 ENCOUNTER — Encounter: Payer: Self-pay | Admitting: Nurse Practitioner

## 2020-12-15 NOTE — Telephone Encounter (Signed)
-----   Message from Ladell Pier, MD sent at 12/12/2020  8:14 PM EST ----- Please call patient, CEA is lower, f/u as scheduled with repeat CEA

## 2020-12-15 NOTE — Telephone Encounter (Signed)
Spoke with pt regarding CEA results. Pt verbalizes that she will follow-up as scheduled. Pt notified that her requested documentation for jury duty will be mailed today.

## 2020-12-15 NOTE — Telephone Encounter (Signed)
Requesting lab results, please call pt back.  

## 2020-12-16 NOTE — Telephone Encounter (Signed)
Returned call to patient. She stated she didn't understand results of CT Head and CT abd/pelvis that were relayed to her. Explained there were no acute findings. She was very Patent attorney. She is requesting a letter with the results be sent to her as she does not have anyway to access MyChart. Hubbard Hartshorn, BSN, RN-BC

## 2020-12-24 NOTE — Telephone Encounter (Signed)
Pt is requesting results to be sent to MyChart; pls contact [phone]

## 2020-12-24 NOTE — Telephone Encounter (Signed)
Pt does not have My Chart. I called pt - stated she would like copy of tests/CT results along with a letter from the doctor explaining the results (stated she did not understand what the doctor said when he callde) mailed to her P O box address. Thanks

## 2020-12-25 NOTE — Telephone Encounter (Signed)
Copy of CT scans mailed to pt.

## 2020-12-25 NOTE — Telephone Encounter (Signed)
I attempted to call patient. If she needs a copy of her results she can request medical records. Dr.Chen reviewed results per his note and shared them with patient. Dr.Bloomfield order studies looking for fracture and intracranial bleeding after a fall. These results were negative. Patient can also download MyChart if she has a smart phone or computer.

## 2021-01-14 ENCOUNTER — Telehealth: Payer: Self-pay

## 2021-01-14 NOTE — Telephone Encounter (Signed)
Pt is requesting a call back about a medical question she asked that her PCP call her back only

## 2021-01-16 ENCOUNTER — Telehealth: Payer: Self-pay

## 2021-01-16 NOTE — Telephone Encounter (Signed)
Pt called requested a copy of her most recent lab be sent 12/12/20 (CEA)   This nurse completes request and lab is mailed

## 2021-01-21 ENCOUNTER — Telehealth: Payer: Self-pay

## 2021-01-21 NOTE — Telephone Encounter (Signed)
Pt is requesting a callback [phone]

## 2021-01-21 NOTE — Telephone Encounter (Signed)
Returned call to patient. States she received a call in mid February discussing results of CT scans and was told she had hardening of the arteries. Wants to know what that is. Explained what this is and that report states it is mild. She wants to know what she can do about it. Explained diet, avoiding saturated fats, physical activity, quitting smoking. Keeping weight, BP, and cholesterol controlled. Last BMI = 21 on 11/10/2020. Last BP 153/97 at [facility] on 11/05/2020 and 130/78 at oncology on 11/10/20. Last lipid panel drawn 09/04/2015 was WNL. States she walks about a mile ever day. Smoking 1-1.5 PPD. Discussed [place] Quitline. States she has patches. Offered appt to discuss further with Provider. Patient declined at this time but was very appreciative of information.

## 2021-01-21 NOTE — Telephone Encounter (Signed)
I agree. Thank you.

## 2021-03-16 ENCOUNTER — Telehealth: Payer: Self-pay | Admitting: Internal Medicine

## 2021-03-16 NOTE — Telephone Encounter (Signed)
Pt requesting a call back. 

## 2021-03-16 NOTE — Telephone Encounter (Signed)
Return pt's call. Stated she wants to talk to Ander Purpura about CT report done in Feb; she had talked to [facility] before.

## 2021-03-17 ENCOUNTER — Other Ambulatory Visit: Payer: Self-pay | Admitting: Student

## 2021-03-17 NOTE — Telephone Encounter (Signed)
Returned call to patient. States she wanted a copy of CT Abdomen that discussed Aortic Atherosclerosis. Explained the report had been mailed to her on 12/24/20. She will look for it and call back if she cannot find it.

## 2021-03-19 ENCOUNTER — Telehealth: Payer: Self-pay | Admitting: Internal Medicine

## 2021-03-19 NOTE — Telephone Encounter (Signed)
Please call the patient back about her last Ct results as she still has questions.

## 2021-03-23 ENCOUNTER — Encounter: Payer: Self-pay | Admitting: Internal Medicine

## 2021-03-23 NOTE — Progress Notes (Signed)
Things That May Be Affecting Your Health:  Alcohol  Hearing loss  Pain    Depression  Home Safety  Sexual Health   Diabetes x Lack of physical activity  Stress   Difficulty with daily activities  Loneliness  Tiredness   Drug use  Medicines x Tobacco use  x Falls  Motor Vehicle Safety  Weight   Food choices  Oral Health  Other    YOUR PERSONALIZED HEALTH PLAN : 1. Schedule your next subsequent Medicare Wellness visit in one year 2. Attend all of your regular appointments to address your medical issues 3. Complete the preventative screenings and services   Annual Wellness Visit   Medicare Covered Preventative Screenings and [place] Men and Women Who How Often Need? Date of Last Service Action  Abdominal Aortic Aneurysm Adults with AAA risk factors Once      Alcohol Misuse and Counseling All Adults Screening once a year if no alcohol misuse. Counseling up to 4 face to face sessions.     Bone Density Measurement  Adults at risk for osteoporosis Once every 2 yrs      Lipid Panel Z13.6 All adults without CV disease Once every 5 yrs       Colorectal Cancer   Stool sample or  Colonoscopy All adults 33 and older   Once every year  Every 10 years        Depression All Adults Once a year  Today   Diabetes Screening Blood glucose, post glucose load, or GTT Z13.1  All adults at risk  Pre-diabetics  Once per year  Twice per year      Diabetes  Self-Management Training All adults Diabetics 10 hrs first year; 2 hours subsequent years. Requires Copay     Glaucoma  Diabetics  Family history of glaucoma  African Americans 59 yrs +  Hispanic Americans 96 yrs + Annually - requires coppay      Hepatitis C Z72.89 or F19.20  High Risk for HCV  Born between 1945 and 1965  Annually  Once      HIV Z11.4 All adults based on risk  Annually btw ages 84 & 61 regardless of risk  Annually > 65 yrs if at increased risk      Lung Cancer Screening  Asymptomatic adults aged 74-77 with 30 pack yr history and current smoker OR quit within the last 15 yrs Annually Must have counseling and shared decision making documentation before first screen x     Medical Nutrition Therapy Adults with   Diabetes  Renal disease  Kidney transplant within past 3 yrs 3 hours first year; 2 hours subsequent years     Obesity and Counseling All adults Screening once a year Counseling if BMI 30 or higher  Today   Tobacco Use Counseling Adults who use tobacco  Up to 8 visits in one year     Vaccines Z23  Hepatitis B  Influenza   Pneumonia  Adults   Once  Once every flu season  Two different vaccines separated by one year x    Next Annual Wellness Visit People with Medicare Every year  Today     Services & Screenings Women Who How Often Need  Date of Last Service Action  Mammogram  Z12.31 Women over 21 One baseline ages 71-39. Annually ager 40 yrs+ x     Pap tests All women Annually if high risk. Every 2 yrs for normal risk women  Screening for cervical cancer with   Pap (Z01.419 nl or Z01.411abnl) &  HPV Z11.51 Women aged 86 to 30 Once every 5 yrs     Screening pelvic and breast exams All women Annually if high risk. Every 2 yrs for normal risk women     Sexually Transmitted Diseases  Chlamydia  Gonorrhea  Syphilis All at risk adults Annually for non pregnant females at increased risk         [place] Men Who How Ofter Need  Date of Last Service Action  Prostate Cancer - DRE & PSA Men over 50 Annually.  DRE might require a copay.        Sexually Transmitted Diseases  Syphilis All at risk adults Annually for men at increased risk      Health Maintenance List Health Maintenance  Topic Date Due  . TETANUS/TDAP  Never done  . PNA vac Low Risk Adult (1 of 2 - PCV13) Never done  . MAMMOGRAM  11/17/2017  . COVID-19 Vaccine (4 - Booster for Pfizer series) 02/08/2021  . INFLUENZA VACCINE  06/01/2021  .  COLONOSCOPY (Pts 45-35yrs Insurance coverage will need to be confirmed)  07/18/2022  . DEXA SCAN  Completed  . Hepatitis C Screening  Completed  . HPV VACCINES  Aged Out

## 2021-04-10 ENCOUNTER — Encounter: Payer: Self-pay | Admitting: *Deleted

## 2021-04-16 ENCOUNTER — Telehealth: Payer: Self-pay | Admitting: *Deleted

## 2021-04-16 NOTE — Telephone Encounter (Signed)
Patient called reporting she wants to be seen on 05/11/21  before proceeding to schedule the CT scan. Declines to schedule scan at this time. Confirmed with her that is her decision.

## 2021-05-11 ENCOUNTER — Encounter: Payer: Self-pay | Admitting: Nurse Practitioner

## 2021-05-11 ENCOUNTER — Inpatient Hospital Stay: Payer: Medicare Other

## 2021-05-11 ENCOUNTER — Other Ambulatory Visit: Payer: Self-pay

## 2021-05-11 ENCOUNTER — Telehealth: Payer: Self-pay

## 2021-05-11 ENCOUNTER — Inpatient Hospital Stay: Payer: Medicare Other | Attending: Nurse Practitioner | Admitting: Nurse Practitioner

## 2021-05-11 ENCOUNTER — Other Ambulatory Visit: Payer: Medicare Other

## 2021-05-11 VITALS — BP 137/87 | HR 80 | Temp 97.8°F | Resp 18 | Ht 61.0 in | Wt 120.0 lb

## 2021-05-11 DIAGNOSIS — C20 Malignant neoplasm of rectum: Secondary | ICD-10-CM

## 2021-05-11 DIAGNOSIS — J449 Chronic obstructive pulmonary disease, unspecified: Secondary | ICD-10-CM | POA: Insufficient documentation

## 2021-05-11 DIAGNOSIS — Z85048 Personal history of other malignant neoplasm of rectum, rectosigmoid junction, and anus: Secondary | ICD-10-CM | POA: Insufficient documentation

## 2021-05-11 DIAGNOSIS — F17210 Nicotine dependence, cigarettes, uncomplicated: Secondary | ICD-10-CM | POA: Diagnosis not present

## 2021-05-11 DIAGNOSIS — Z803 Family history of malignant neoplasm of breast: Secondary | ICD-10-CM | POA: Insufficient documentation

## 2021-05-11 DIAGNOSIS — Z923 Personal history of irradiation: Secondary | ICD-10-CM | POA: Insufficient documentation

## 2021-05-11 LAB — BASIC METABOLIC PANEL - CANCER CENTER ONLY
Anion gap: 8 (ref 5–15)
BUN: 16 mg/dL (ref 8–23)
CO2: 27 mmol/L (ref 22–32)
Calcium: 9.6 mg/dL (ref 8.9–10.3)
Chloride: 100 mmol/L (ref 98–111)
Creatinine: 0.95 mg/dL (ref 0.44–1.00)
GFR, Estimated: >60 mL/min (ref >60)
Glucose, Bld: 91 mg/dL (ref 70–99)
Potassium: 4.2 mmol/L (ref 3.5–5.1)
Sodium: 135 mmol/L (ref 135–145)

## 2021-05-11 LAB — CEA (IN HOUSE-CHCC): CEA (CHCC-In House): 4.13 ng/mL (ref 0.00–5.00)

## 2021-05-11 LAB — CEA (ACCESS): CEA (CHCC): 4.02 ng/mL (ref 0.00–5.00)

## 2021-05-11 NOTE — Telephone Encounter (Signed)
Called no answers left message to review most recent CEA results encouraged pt to call for questions concerns or changes

## 2021-05-11 NOTE — Telephone Encounter (Signed)
-----   Message from Ladell Pier, MD sent at 05/11/2021  1:31 PM EDT ----- Please call patient, CEA is normal, follow-up as scheduled

## 2021-05-11 NOTE — Progress Notes (Signed)
  [place] OFFICE PROGRESS NOTE   Diagnosis: Rectal cancer  INTERVAL HISTORY:   Deborah Arroyo returns for follow-up.  No change in bowel habits.  She has a good appetite.  Objective:  Vital signs in last 24 hours:  Blood pressure 137/87, pulse 80, temperature 97.8 F (36.6 C), temperature source Oral, resp. rate 18, height $RemoveBe'5\' 1"'LKklncuLu$  (1.549 m), weight 120 lb (54.4 kg), SpO2 99 %.    HEENT: Neck without mass. Lymphatics: No palpable cervical, supraclavicular, axillary or inguinal lymph nodes. Resp: Distant breath sounds.  No respiratory distress. Cardio: Regular rate and rhythm. GI: Abdomen soft and nontender.  No hepatomegaly.  No mass. Vascular: No leg edema.   Lab Results:  Lab Results  Component Value Date   WBC 14.3 (H) 06/20/2019   HGB 13.7 06/20/2019   HCT 41.2 06/20/2019   MCV 95.4 06/20/2019   PLT 330 06/20/2019   NEUTROABS 8.1 (H) 04/23/2019    Imaging:  No results found.  Medications: I have reviewed the patient's current medications.  Assessment/Plan: Rectal cancer, obstructing rectal mass on colonoscopy 03/01/2018- biopsy confirmed adenocarcinoma Incomplete colonoscopy secondary to obstructing tumor Staging CTs on 03/03/2018- small mediastinal/hilar lymph nodes, indeterminate lingula nodule, rectal mass, small perirectal lymph nodes, mass measured at approximately 4.9 cm from the anal verge MRI pelvis 03/11/2018- T3c,N2 Radiation/Xeloda 04/03/2018-05/11/2018 Colonoscopy 07/04/2018- mass at 8 cm from the dentate line, polyps removed from the hepatic flexure and descending colon-tubular adenomas Low anterior resection/ileostomy 07/12/2018, T3N0 moderately differentiated adenocarcinoma, margins negative, 11- lymph nodes, no lymphovascular or perineural invasion, MSI-stable, no loss of mismatch repair protein expression Cycle 1 adjuvant Xeloda 08/07/2018 Cycle 2 adjuvant Xeloda 08/28/2018 Cycle 3 adjuvant Xeloda 09/18/2018 Cycle 4 adjuvant Xeloda 10/09/2018  (Xeloda held 10/13/2018 pm dose, 10/14/2018 and 10/15/2018; resumed 10/16/2018 at a reduced dose of 1000 mg every morning and 500 mg every afternoon for the remainder of the cycle) Cycle 5 adjuvant Xeloda 10/30/2018 CTs 01/04/2019- low anterior resection with a right lower quadrant ileostomy, no evidence of metastatic disease Takedown of diverting loop ileostomy and closure of parastomal hernia 04/20/2019 Surveillance colonoscopy 07/19/2019-2 mm polyp in the ascending colon (sessile serrated polyp without cytologic dysplasia), next colonoscopy at a 3-year interval CTs 06/04/2020- no evidence of metastatic disease, centrilobular emphysema Tobacco use COPD Family history of multiple cancers      Disposition: Deborah Arroyo remains in clinical remission from rectal cancer.  We will follow-up on the CEA from today.  She is due for surveillance CT scans.  We discussed the rationale for the scans.  She is undecided as to whether or not she will have the scans.  She was provided with the number for radiology central scheduling if she decides to proceed.  She reports being up-to-date on surveillance colonoscopy.  She will return for CEA and follow-up visit in 6 months.  We are available to see her sooner if needed.    Ned Card ANP/GNP-BC   05/11/2021  11:35 AM

## 2021-05-12 ENCOUNTER — Other Ambulatory Visit: Payer: Self-pay | Admitting: *Deleted

## 2021-05-12 ENCOUNTER — Ambulatory Visit: Payer: Medicare Other | Admitting: Nurse Practitioner

## 2021-05-12 DIAGNOSIS — C20 Malignant neoplasm of rectum: Secondary | ICD-10-CM

## 2021-05-12 NOTE — Progress Notes (Signed)
Orders for scan placed and patient will call central scheduling to set up when/if she is ready.

## 2021-05-20 DIAGNOSIS — C20 Malignant neoplasm of rectum: Secondary | ICD-10-CM | POA: Diagnosis not present

## 2021-05-27 DIAGNOSIS — M810 Age-related osteoporosis without current pathological fracture: Secondary | ICD-10-CM | POA: Diagnosis not present

## 2021-05-27 DIAGNOSIS — M85851 Other specified disorders of bone density and structure, right thigh: Secondary | ICD-10-CM | POA: Diagnosis not present

## 2021-05-27 DIAGNOSIS — Z1231 Encounter for screening mammogram for malignant neoplasm of breast: Secondary | ICD-10-CM | POA: Diagnosis not present

## 2021-05-27 DIAGNOSIS — M85852 Other specified disorders of bone density and structure, left thigh: Secondary | ICD-10-CM | POA: Diagnosis not present

## 2021-06-01 ENCOUNTER — Telehealth: Payer: Self-pay | Admitting: Internal Medicine

## 2021-06-01 NOTE — Telephone Encounter (Signed)
Returned call to patient. States she had mammo and Dexa scan on 7/26 at [facility]. She received the report on Dexa scan and it states osteoporosis has increased in lumbar and hip. She is advised to contact [facility] and ask them to send [place] the reports. She will do that now.

## 2021-06-01 NOTE — Telephone Encounter (Signed)
Pt requesting her test Results from her last Bone Density test and Mammogram.

## 2021-06-12 ENCOUNTER — Ambulatory Visit (HOSPITAL_BASED_OUTPATIENT_CLINIC_OR_DEPARTMENT_OTHER)
Admission: RE | Admit: 2021-06-12 | Discharge: 2021-06-12 | Disposition: A | Discharge status: Home / Self Care | Payer: Medicare Other | Source: Ambulatory Visit | Attending: Oncology | Admitting: Oncology

## 2021-06-12 ENCOUNTER — Encounter (HOSPITAL_BASED_OUTPATIENT_CLINIC_OR_DEPARTMENT_OTHER): Payer: Self-pay

## 2021-06-12 ENCOUNTER — Telehealth: Payer: Self-pay | Admitting: *Deleted

## 2021-06-12 ENCOUNTER — Other Ambulatory Visit: Payer: Self-pay

## 2021-06-12 DIAGNOSIS — C20 Malignant neoplasm of rectum: Secondary | ICD-10-CM | POA: Insufficient documentation

## 2021-06-12 DIAGNOSIS — N281 Cyst of kidney, acquired: Secondary | ICD-10-CM | POA: Diagnosis not present

## 2021-06-12 DIAGNOSIS — J9811 Atelectasis: Secondary | ICD-10-CM | POA: Diagnosis not present

## 2021-06-12 DIAGNOSIS — J939 Pneumothorax, unspecified: Secondary | ICD-10-CM | POA: Diagnosis not present

## 2021-06-12 DIAGNOSIS — I700 Atherosclerosis of aorta: Secondary | ICD-10-CM | POA: Diagnosis not present

## 2021-06-12 MED ORDER — IOHEXOL 350 MG/ML SOLN
60.0000 mL | Freq: Once | INTRAVENOUS | Status: AC | PRN
  Administered 2021-06-12: 60 mL via INTRAVENOUS

## 2021-06-12 NOTE — Telephone Encounter (Signed)
Notified patient that her BMP lab was 1 day out of date in regards to CT w/contrast today. Last BMP 05/11/21 showed normal creatinine at 0.95. MD notified and feels there is no issue. Left VM for patient to push extra oral fluids over next 48 hours.

## 2021-06-15 ENCOUNTER — Telehealth: Payer: Self-pay

## 2021-06-15 NOTE — Telephone Encounter (Signed)
Return phone call to Pt inquiring about CT scan results. Per Dr Benay Spice informed Pt that CT scan was negative. Pt verbalized understanding. No further problems or concerns noted.Pt requested results be mailed. Mailed today

## 2021-06-17 ENCOUNTER — Telehealth: Payer: Self-pay | Admitting: Internal Medicine

## 2021-06-17 NOTE — Telephone Encounter (Signed)
Called and review patients Bone Density Scan and Mammography. Her BMD is improved on Alendronate. In addition Mammography was nl.

## 2021-07-14 ENCOUNTER — Encounter: Payer: Self-pay | Admitting: Pharmacist

## 2021-07-14 ENCOUNTER — Other Ambulatory Visit: Payer: Self-pay | Admitting: Internal Medicine

## 2021-07-14 ENCOUNTER — Ambulatory Visit (INDEPENDENT_AMBULATORY_CARE_PROVIDER_SITE_OTHER): Payer: Medicare Other | Admitting: Pharmacist

## 2021-07-14 VITALS — Wt 120.0 lb

## 2021-07-14 DIAGNOSIS — M810 Age-related osteoporosis without current pathological fracture: Secondary | ICD-10-CM

## 2021-07-14 DIAGNOSIS — Z0000 Encounter for general adult medical examination without abnormal findings: Secondary | ICD-10-CM | POA: Diagnosis not present

## 2021-07-14 NOTE — Progress Notes (Signed)
This AWV is being conducted by Deborah Arroyo only. The patient was located at home and I was located in [facility]. The patient's identity was confirmed using their DOB and current address. The patient or his/her legal guardian has consented to being evaluated through a telephone encounter and understands the associated risks (an examination cannot be done and the patient may need to come in for an appointment) / benefits (allows the patient to remain at home, decreasing exposure to coronavirus). I personally spent 31 minutes conducting the AWV.  Subjective:   Deborah Arroyo is a 70 y.o. female who presents for a Medicare Annual Wellness Visit.  The following items have been reviewed and updated today in the appropriate area in the EMR.   Health Risk Assessment  Height, weight, BMI, and BP Visual acuity if needed Depression screen Fall risk / safety level Advance directive discussion Medical and family history were reviewed and updated Updating list of other providers & suppliers Medication reconciliation, including over the counter medicines Cognitive screen Written screening schedule Risk Factor list Personalized health advice, risky behaviors, and treatment advice  Social History   Social History Narrative   Divorced, lives with 2 sons and 1 grandchild, I dog. She drinks 3-4 cups of coffee a day. Is very active around the house. Has 3 brothers and 6 sisters.      Current Social History 08/29/2020        Patient lives with 2 sons and 1 grandson in a two level home. There are 2 steps without handrails up to the entrance the patient uses.       Patient's method of transportation is via friend's car at present.      The highest level of education was 8 th grade      The patient currently retired from [employer].      Identified important Relationships are: "My daughter, my sister."       Pets : A miniature shepherd named CJ       Interests / Fun: "Listen to TV, read a  book."       Current Stressors: "Where I live, my car (broken down)"       Religious / Personal Beliefs: None       L. [place], BSN, RN-BC              Objective:    Vitals: There were no vitals taken for this visit. Vitals are patient reported  Activities of Daily Living In your present state of health, do you have any difficulty performing the following activities: 11/05/2020 08/29/2020  Hearing? 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 Y Y  Comment blurry. Cataract on right eye cataract in right eye  Difficulty concentrating or making decisions? N N  Walking or climbing stairs? Y Y  Comment leg pain Stairs  Dressing or bathing? N N  Doing errands, shopping? N N  Some recent data might be hidden    Goals  Goals      Exercise 1x per week     Begin seated and standing exercises with exercise band to increase strength and balance.         Fall Risk Fall Risk  11/05/2020 08/29/2020 08/12/2020 02/13/2020 06/11/2019  Falls in the past year? 1 0 0 0 0  Number falls in past yr: 1 - 0 - -  Injury with Fall? 1 - - - -  Risk for fall due to : History  of fall(s);Impaired balance/gait No Fall Risks No Fall Risks No Fall Risks -  Risk for fall due to: Comment fell off bed - - - -  Follow up Falls prevention discussed Education provided;Falls prevention discussed Falls evaluation completed Falls prevention discussed Falls evaluation completed    Depression Screen PHQ 2/9 Scores 11/05/2020 08/29/2020 08/12/2020 02/13/2020  PHQ - 2 Score '2 2 3 2  '$ PHQ- 9 Score '9 11 13 8     '$ Cognitive Testing Six-Item Cognitive Screener   "I would like to ask you some questions that ask you to use your memory. I am going to name three objects. Please wait until I say all three words, then repeat them. Remember what they are  because I am going to ask you to name them again in a few minutes. Please repeat these words for me: APPLE--TABLE--PENNY." (Interviewer may repeat names 3 times if  necessary but repetition not scored.)  Did patient correctly repeat all three words? Yes - may proceed with screen  What year is this? Correct What month is this? Correct What day of the week is this? Correct  What were the three objects I asked you to remember? Apple Correct Table Correct Penny Correct  Score one point for each incorrect answer.  A score of 2 or more points warrants additional investigation.  Patient's score 0     Assessment and Plan:    During the course of the visit the patient was educated and counseled about appropriate screening and preventive services as documented in the assessment and plan.  Recommended Shingles, pneumonia, flu, and tetanus vaccines. Discussed with patient but she declines wanting to receive any.  Recommended smoking cessation. Discussed with patient we can assist when she is ready.  Elevated PHQ9. Placed referral to Dr. Theodis Shove.  The printed AVS was given to the patient and included an updated screening schedule, a list of risk factors, and personalized health advice.        Hughes Better, RPH-CPP  07/14/2021

## 2021-07-20 NOTE — Patient Instructions (Addendum)
Annual Wellness Visit   Medicare Covered Preventative Screenings and Services  Services & Screenings Men and Women Who How Often Need? Date of Last Service Action  Abdominal Aortic Aneurysm Adults with AAA risk factors Once     Alcohol Misuse and Counseling All Adults Screening once a year if no alcohol misuse. Counseling up to 4 face to face sessions.     Bone Density Measurement  Adults at risk for osteoporosis Once every 2 yrs     Lipid Panel Z13.6 All adults without CV disease Once every 5 yrs     Colorectal Cancer  Stool sample or Colonoscopy All adults 90 and older  Once every year Every 10 years     Depression All Adults Once a year  Today   Diabetes Screening Blood glucose, post glucose load, or GTT Z13.1 All adults at risk Pre-diabetics Once per year Twice per year     Diabetes  Self-Management Training All adults Diabetics 10 hrs first year; 2 hours subsequent years. Requires Copay     Glaucoma Diabetics Family history of glaucoma African Americans 37 yrs + Hispanic Americans 32 yrs + Annually - requires coppay     Hepatitis C Z72.89 or F19.20 High Risk for HCV Born between 1945 and 1965 Annually Once     HIV Z11.4 All adults based on risk Annually btw ages 74 & 65 regardless of risk Annually > 65 yrs if at increased risk     Lung Cancer Screening Asymptomatic adults aged 20-77 with 30 pack yr history and current smoker OR quit within the last 15 yrs Annually Must have counseling and shared decision making documentation before first screen     Medical Nutrition Therapy Adults with  Diabetes Renal disease Kidney transplant within past 3 yrs 3 hours first year; 2 hours subsequent years     Obesity and Counseling All adults Screening once a year Counseling if BMI 30 or higher  Today   Tobacco Use Counseling Adults who use tobacco  Up to 8 visits in one year     Vaccines Z23 Hepatitis B Influenza  Pneumonia  Adults  Once Once every flu season Two different  vaccines separated by one year     Next Annual Wellness Visit People with Medicare Every year  Today     Services & Screenings Women Who How Often Need  Date of Last Service Action  Mammogram  Z12.31 Women over 47 One baseline ages 12-39. Annually ager 40 yrs+     Pap tests All women Annually if high risk. Every 2 yrs for normal risk women     Screening for cervical cancer with  Pap (Z01.419 nl or Z01.411abnl) & HPV Z11.51 Women aged 84 to 38 Once every 5 yrs     Screening pelvic and breast exams All women Annually if high risk. Every 2 yrs for normal risk women     Sexually Transmitted Diseases Chlamydia Gonorrhea Syphilis All at risk adults Annually for non pregnant females at increased risk         [place] Men Who How Ofter Need  Date of Last Service Action  Prostate Cancer - DRE & PSA Men over 50 Annually.  DRE might require a copay.     Sexually Transmitted Diseases Syphilis All at risk adults Annually for men at increased risk         Things That May Be Affecting Your Health:  Alcohol  Hearing loss X Pain   X Depression  Home  Safety  Sexual Health   Diabetes  Lack of physical activity  Stress   Difficulty with daily activities  Loneliness  Tiredness   Drug use  Medicines X Tobacco use   Falls  Motor Vehicle Safety  Weight   Food choices  Oral Health  Other    YOUR PERSONALIZED HEALTH PLAN : 1. Schedule your next subsequent Medicare Wellness visit in one year 2. Attend all of your regular appointments to address your medical issues 3. Complete the preventative screenings and services 4. A referral was placed to Dr. Theodis Shove, our behavioral health therapist 5. We recommend you quit smoking. When you are ready we are here to assist you! 6. We recommend you receive your Shingles, pneumonia, flu, and tetanus vaccines. If you decide to receive these outside of our clinic let us know so we may update our records.   Fall Prevention in the Home,  Adult Falls can cause injuries and can happen to people of all ages. There are many things you can do to make your home safe and to help prevent falls. Ask for help when making these changes. What actions can I take to prevent falls? General Instructions Use good lighting in all rooms. Replace any light bulbs that burn out. Turn on the lights in dark areas. Use night-lights. Keep items that you use often in easy-to-reach places. Lower the shelves around your home if needed. Set up your furniture so you have a clear path. Avoid moving your furniture around. Do not have throw rugs or other things on the floor that can make you trip. Avoid walking on wet floors. If any of your floors are uneven, fix them. Add color or contrast paint or tape to clearly mark and help you see: Grab bars or handrails. First and last steps of staircases. Where the edge of each step is. If you use a stepladder: Make sure that it is fully opened. Do not climb a closed stepladder. Make sure the sides of the stepladder are locked in place. Ask someone to hold the stepladder while you use it. Know where your pets are when moving through your home. What can I do in the bathroom?   Keep the floor dry. Clean up any water on the floor right away. Remove soap buildup in the tub or shower. Use nonskid mats or decals on the floor of the tub or shower. Attach bath mats securely with double-sided, nonslip rug tape. If you need to sit down in the shower, use a plastic, nonslip stool. Install grab bars by the toilet and in the tub and shower. Do not use towel bars as grab bars. What can I do in the bedroom? Make sure that you have a light by your bed that is easy to reach. Do not use any sheets or blankets for your bed that hang to the floor. Have a firm chair with side arms that you can use for support when you get dressed. What can I do in the kitchen? Clean up any spills right away. If you need to reach something above  you, use a step stool with a grab bar. Keep electrical cords out of the way. Do not use floor polish or wax that makes floors slippery. What can I do with my stairs? Do not leave any items on the stairs. Make sure that you have a light switch at the top and the bottom of the stairs. Make sure that there are handrails on both sides of the stairs. Fix  handrails that are broken or loose. Install nonslip stair treads on all your stairs. Avoid having throw rugs at the top or bottom of the stairs. Choose a carpet that does not hide the edge of the steps on the stairs. Check carpeting to make sure that it is firmly attached to the stairs. Fix carpet that is loose or worn. What can I do on the outside of my home? Use bright outdoor lighting. Fix the edges of walkways and driveways and fix any cracks. Remove anything that might make you trip as you walk through a door, such as a raised step or threshold. Trim any bushes or trees on paths to your home. Check to see if handrails are loose or broken and that both sides of all steps have handrails. Install guardrails along the edges of any raised decks and porches. Clear paths of anything that can make you trip, such as tools or rocks. Have leaves, snow, or ice cleared regularly. Use sand or salt on paths during winter. Clean up any spills in your garage right away. This includes grease or oil spills. What other actions can I take? Wear shoes that: Have a low heel. Do not wear high heels. Have rubber bottoms. Feel good on your feet and fit well. Are closed at the toe. Do not wear open-toe sandals. Use tools that help you move around if needed. These include: Canes. Walkers. Scooters. Crutches. Review your medicines with your doctor. Some medicines can make you feel dizzy. This can increase your chance of falling. Ask your doctor what else you can do to help prevent falls. Where to find more information Centers for Disease Control and  Prevention, [employer]: http://www.wolf.info/ National Institute on Aging: http://kim-miller.com/ Contact a doctor if: You are afraid of falling at home. You feel weak, drowsy, or dizzy at home. You fall at home. Summary There are many simple things that you can do to make your home safe and to help prevent falls. Ways to make your home safe include removing things that can make you trip and installing grab bars in the bathroom. Ask for help when making these changes in your home. This information is not intended to replace advice given to you by your health care provider. Make sure you discuss any questions you have with your health care provider. Document Revised: 05/21/2020 Document Reviewed: 05/21/2020 Elsevier Patient Education  [place] Maintenance, Female Adopting a healthy lifestyle and getting preventive care are important in promoting health and wellness. Ask your health care provider about: The right schedule for you to have regular tests and exams. Things you can do on your own to prevent diseases and keep yourself healthy. What should I know about diet, weight, and exercise? Eat a healthy diet  Eat a diet that includes plenty of vegetables, fruits, low-fat dairy products, and lean protein. Do not eat a lot of foods that are high in solid fats, added sugars, or sodium. Maintain a healthy weight Body mass index (BMI) is used to identify weight problems. It estimates body fat based on height and weight. Your health care provider can help determine your BMI and help you achieve or maintain a healthy weight. Get regular exercise Get regular exercise. This is one of the most important things you can do for your health. Most adults should: Exercise for at least 150 minutes each week. The exercise should increase your heart rate and make you sweat (moderate-intensity exercise). Do strengthening exercises at least twice a week. This  is in addition to the moderate-intensity  exercise. Spend less time sitting. Even light physical activity can be beneficial. Watch cholesterol and blood lipids Have your blood tested for lipids and cholesterol at 71 years of age, then have this test every 5 years. Have your cholesterol levels checked more often if: Your lipid or cholesterol levels are high. You are older than 71 years of age. You are at high risk for heart disease. What should I know about cancer screening? Depending on your health history and family history, you may need to have cancer screening at various ages. This may include screening for: Breast cancer. Cervical cancer. Colorectal cancer. Skin cancer. Lung cancer. What should I know about heart disease, diabetes, and high blood pressure? Blood pressure and heart disease High blood pressure causes heart disease and increases the risk of stroke. This is more likely to develop in people who have high blood pressure readings, are of African descent, or are overweight. Have your blood pressure checked: Every 3-5 years if you are 14-37 years of age. Every year if you are 100 years old or older. Diabetes Have regular diabetes screenings. This checks your fasting blood sugar level. Have the screening done: Once every three years after age 4 if you are at a normal weight and have a low risk for diabetes. More often and at a younger age if you are overweight or have a high risk for diabetes. What should I know about preventing infection? Hepatitis B If you have a higher risk for hepatitis B, you should be screened for this virus. Talk with your health care provider to find out if you are at risk for hepatitis B infection. Hepatitis C Testing is recommended for: Everyone born from [birth year] through 1965. Anyone with known risk factors for hepatitis C. Sexually transmitted infections (STIs) Get screened for STIs, including gonorrhea and chlamydia, if: You are sexually active and are younger than 71 years of age. You  are older than 71 years of age and your health care provider tells you that you are at risk for this type of infection. Your sexual activity has changed since you were last screened, and you are at increased risk for chlamydia or gonorrhea. Ask your health care provider if you are at risk. Ask your health care provider about whether you are at high risk for HIV. Your health care provider may recommend a prescription medicine to help prevent HIV infection. If you choose to take medicine to prevent HIV, you should first get tested for HIV. You should then be tested every 3 months for as long as you are taking the medicine. Pregnancy If you are about to stop having your period (premenopausal) and you may become pregnant, seek counseling before you get pregnant. Take 400 to 800 micrograms (mcg) of folic acid every day if you become pregnant. Ask for birth control (contraception) if you want to prevent pregnancy. Osteoporosis and menopause Osteoporosis is a disease in which the bones lose minerals and strength with aging. This can result in bone fractures. If you are 52 years old or older, or if you are at risk for osteoporosis and fractures, ask your health care provider if you should: Be screened for bone loss. Take a calcium or vitamin D supplement to lower your risk of fractures. Be given hormone replacement therapy (HRT) to treat symptoms of menopause. Follow these instructions at home: Lifestyle Do not use any products that contain nicotine or tobacco, such as cigarettes, e-cigarettes, and chewing tobacco. If  you need help quitting, ask your health care provider. Do not use street drugs. Do not share needles. Ask your health care provider for help if you need support or information about quitting drugs. Alcohol use Do not drink alcohol if: Your health care provider tells you not to drink. You are pregnant, may be pregnant, or are planning to become pregnant. If you drink alcohol: Limit how  much you use to 0-1 drink a day. Limit intake if you are breastfeeding. Be aware of how much alcohol is in your drink. In the [place] one drink equals one 12 oz bottle of beer (355 mL), one 5 oz glass of wine (148 mL), or one 1 oz glass of hard liquor (44 mL). General instructions Schedule regular health, dental, and eye exams. Stay current with your vaccines. Tell your health care provider if: You often feel depressed. You have ever been abused or do not feel safe at home. Summary Adopting a healthy lifestyle and getting preventive care are important in promoting health and wellness. Follow your health care provider's instructions about healthy diet, exercising, and getting tested or screened for diseases. Follow your health care provider's instructions on monitoring your cholesterol and blood pressure. This information is not intended to replace advice given to you by your health care provider. Make sure you discuss any questions you have with your health care provider. Document Revised: 12/26/2020 Document Reviewed: 10/11/2018 Elsevier Patient Education  2022 [place].  Steps to Quit Smoking Smoking tobacco is the leading cause of preventable death. It can affect almost every organ in the body. Smoking puts you and those around you at risk for developing many serious chronic diseases. Quitting smoking can be difficult, but it is one of the best things that you can do for your health. It is never too late to quit. How do I get ready to quit? When you decide to quit smoking, create a plan to help you succeed. Before you quit: Pick a date to quit. Set a date within the next 2 weeks to give you time to prepare. Write down the reasons why you are quitting. Keep this list in places where you will see it often. Tell your family, friends, and co-workers that you are quitting. Support from your loved ones can make quitting easier. Talk with your health care provider about your options for  quitting smoking. Find out what treatment options are covered by your health insurance. Identify people, places, things, and activities that make you want to smoke (triggers). Avoid them. What first steps can I take to quit smoking? Throw away all cigarettes at home, at work, and in your car. Throw away smoking accessories, such as Scientist, research (medical). Clean your car. Make sure to empty the ashtray. Clean your home, including curtains and carpets. What strategies can I use to quit smoking? Talk with your health care provider about combining strategies, such as taking medicines while you are also receiving in-person counseling. Using these two strategies together makes you more likely to succeed in quitting than if you used either strategy on its own. If you are pregnant or breastfeeding, talk with your health care provider about finding counseling or other support strategies to quit smoking. Do not take medicine to help you quit smoking unless your health care provider tells you to do so. To quit smoking: Quit right away Quit smoking completely, instead of gradually reducing how much you smoke over a period of time. Research shows that stopping smoking right away is  more successful than gradually quitting. Attend in-person counseling to help you build problem-solving skills. You are more likely to succeed in quitting if you attend counseling sessions regularly. Even short sessions of 10 minutes can be effective. Take medicine You may take medicines to help you quit smoking. Some medicines require a prescription and some you can purchase over-the-counter. Medicines may have nicotine in them to replace the nicotine in cigarettes. Medicines may: Help to stop cravings. Help to relieve withdrawal symptoms. Your health care provider may recommend: Nicotine patches, gum, or lozenges. Nicotine inhalers or sprays. Non-nicotine medicine that is taken by mouth. Find resources Find resources and  support systems that can help you to quit smoking and remain smoke-free after you quit. These resources are most helpful when you use them often. They include: Online chats with a Social worker. Telephone quitlines. Printed Furniture conservator/restorer. Support groups or group counseling. Text messaging programs. Mobile phone apps or applications. Use apps that can help you stick to your quit plan by providing reminders, tips, and encouragement. There are many free apps for mobile devices as well as websites. Examples include Quit Guide from the [employer] and smokefree.gov What things can I do to make it easier to quit?  Reach out to your family and friends for support and encouragement. Call telephone quitlines (1-800-QUIT-NOW), reach out to support groups, or work with a counselor for support. Ask people who smoke to avoid smoking around you. Avoid places that trigger you to smoke, such as bars, parties, or smoke-break areas at work. Spend time with people who do not smoke. Lessen the stress in your life. Stress can be a smoking trigger for some people. To lessen stress, try: Exercising regularly. Doing deep-breathing exercises. Doing yoga. Meditating. Performing a body scan. This involves closing your eyes, scanning your body from head to toe, and noticing which parts of your body are particularly tense. Try to relax the muscles in those areas. How will I feel when I quit smoking? Day 1 to 3 weeks Within the first 24 hours of quitting smoking, you may start to feel withdrawal symptoms. These symptoms are usually most noticeable 2-3 days after quitting, but they usually do not last for more than 2-3 weeks. You may experience these symptoms: Mood swings. Restlessness, anxiety, or irritability. Trouble concentrating. Dizziness. Strong cravings for sugary foods and nicotine. Mild weight gain. Constipation. Nausea. Coughing or a sore throat. Changes in how the medicines that you take for unrelated issues  work in your body. Depression. Trouble sleeping (insomnia). Week 3 and afterward After the first 2-3 weeks of quitting, you may start to notice more positive results, such as: Improved sense of smell and taste. Decreased coughing and sore throat. Slower heart rate. Lower blood pressure. Clearer skin. The ability to breathe more easily. Fewer sick days. Quitting smoking can be very challenging. Do not get discouraged if you are not successful the first time. Some people need to make many attempts to quit before they achieve long-term success. Do your best to stick to your quit plan, and talk with your health care provider if you have any questions or concerns. Summary Smoking tobacco is the leading cause of preventable death. Quitting smoking is one of the best things that you can do for your health. When you decide to quit smoking, create a plan to help you succeed. Quit smoking right away, not slowly over a period of time. When you start quitting, seek help from your health care provider, family, or friends. This information  is not intended to replace advice given to you by your health care provider. Make sure you discuss any questions you have with your health care provider. Document Revised: 07/13/2019 Document Reviewed: 01/06/2019 Elsevier Patient Education  [place].  Steps to Quit Smoking Smoking tobacco is the leading cause of preventable death. It can affect almost every organ in the body. Smoking puts you and people around you at risk for many serious, long-lasting (chronic) diseases. Quitting smoking can be hard, but it is one of the best things that you can do for your health. It is never too late to quit. How do I get ready to quit? When you decide to quit smoking, make a plan to help you succeed. Before you quit: Pick a date to quit. Set a date within the next 2 weeks to give you time to prepare. Write down the reasons why you are quitting. Keep this list in places  where you will see it often. Tell your family, friends, and co-workers that you are quitting. Their support is important. Talk with your doctor about the choices that may help you quit. Find out if your health insurance will pay for these treatments. Know the people, places, things, and activities that make you want to smoke (triggers). Avoid them. What first steps can I take to quit smoking? Throw away all cigarettes at home, at work, and in your car. Throw away the things that you use when you smoke, such as ashtrays and lighters. Clean your car. Make sure to empty the ashtray. Clean your home, including curtains and carpets. What can I do to help me quit smoking? Talk with your doctor about taking medicines and seeing a counselor at the same time. You are more likely to succeed when you do both. If you are pregnant or breastfeeding, talk with your doctor about counseling or other ways to quit smoking. Do not take medicine to help you quit smoking unless your doctor tells you to do so. To quit smoking: Quit right away Quit smoking totally, instead of slowly cutting back on how much you smoke over a period of time. Go to counseling. You are more likely to quit if you go to counseling sessions regularly. Take medicine You may take medicines to help you quit. Some medicines need a prescription, and some you can buy over-the-counter. Some medicines may contain a drug called nicotine to replace the nicotine in cigarettes. Medicines may: Help you to stop having the desire to smoke (cravings). Help to stop the problems that come when you stop smoking (withdrawal symptoms). Your doctor may ask you to use: Nicotine patches, gum, or lozenges. Nicotine inhalers or sprays. Non-nicotine medicine that is taken by mouth. Find resources Find resources and other ways to help you quit smoking and remain smoke-free after you quit. These resources are most helpful when you use them often. They  include: Online chats with a Social worker. Phone quitlines. Printed Furniture conservator/restorer. Support groups or group counseling. Text messaging programs. Mobile phone apps. Use apps on your mobile phone or tablet that can help you stick to your quit plan. There are many free apps for mobile phones and tablets as well as websites. Examples include Quit Guide from the [employer] and smokefree.gov  What things can I do to make it easier to quit?  Talk to your family and friends. Ask them to support and encourage you. Call a phone quitline (1-800-QUIT-NOW), reach out to support groups, or work with a Social worker. Ask people who  smoke to not smoke around you. Avoid places that make you want to smoke, such as: Bars. Parties. Smoke-break areas at work. Spend time with people who do not smoke. Lower the stress in your life. Stress can make you want to smoke. Try these things to help your stress: Getting regular exercise. Doing deep-breathing exercises. Doing yoga. Meditating. Doing a body scan. To do this, close your eyes, focus on one area of your body at a time from head to toe. Notice which parts of your body are tense. Try to relax the muscles in those areas. How will I feel when I quit smoking? Day 1 to 3 weeks Within the first 24 hours, you may start to have some problems that come from quitting tobacco. These problems are very bad 2-3 days after you quit, but they do not often last for more than 2-3 weeks. You may get these symptoms: Mood swings. Feeling restless, nervous, angry, or annoyed. Trouble concentrating. Dizziness. Strong desire for high-sugar foods and nicotine. Weight gain. Trouble pooping (constipation). Feeling like you may vomit (nausea). Coughing or a sore throat. Changes in how the medicines that you take for other issues work in your body. Depression. Trouble sleeping (insomnia). Week 3 and afterward After the first 2-3 weeks of quitting, you may start to notice more positive  results, such as: Better sense of smell and taste. Less coughing and sore throat. Slower heart rate. Lower blood pressure. Clearer skin. Better breathing. Fewer sick days. Quitting smoking can be hard. Do not give up if you fail the first time. Some people need to try a few times before they succeed. Do your best to stick to your quit plan, and talk with your doctor if you have any questions or concerns. Summary Smoking tobacco is the leading cause of preventable death. Quitting smoking can be hard, but it is one of the best things that you can do for your health. When you decide to quit smoking, make a plan to help you succeed. Quit smoking right away, not slowly over a period of time. When you start quitting, seek help from your doctor, family, or friends. This information is not intended to replace advice given to you by your health care provider. Make sure you discuss any questions you have with your health care provider. Document Revised: 07/13/2019 Document Reviewed: 01/06/2019 Elsevier Patient Education  [place].

## 2021-07-21 NOTE — Progress Notes (Signed)
I discussed the AWV findings with the provider who conducted the visit. I was present in the office suite and immediately available to provide assistance and direction throughout the time the service was provided.  Mitzi Hansen, MD Internal Medicine Resident PGY-3 [facility] Pager: [phone] 07/21/2021 11:26 AM

## 2021-07-24 NOTE — Progress Notes (Signed)
Internal Medicine Clinic Attending  Case discussed with Dr. Darrick Meigs .  I reviewed the AWV findings.  I agree with the assessment, diagnosis, and plan of care documented in the AWV note.

## 2021-08-20 ENCOUNTER — Ambulatory Visit (INDEPENDENT_AMBULATORY_CARE_PROVIDER_SITE_OTHER): Payer: Medicare Other | Admitting: Internal Medicine

## 2021-08-20 ENCOUNTER — Encounter: Payer: Self-pay | Admitting: Internal Medicine

## 2021-08-20 ENCOUNTER — Other Ambulatory Visit: Payer: Self-pay

## 2021-08-20 VITALS — BP 126/80 | HR 82 | Temp 97.9°F | Resp 24 | Ht 61.0 in | Wt 125.2 lb

## 2021-08-20 DIAGNOSIS — J439 Emphysema, unspecified: Secondary | ICD-10-CM | POA: Insufficient documentation

## 2021-08-20 DIAGNOSIS — M810 Age-related osteoporosis without current pathological fracture: Secondary | ICD-10-CM

## 2021-08-20 DIAGNOSIS — Z720 Tobacco use: Secondary | ICD-10-CM

## 2021-08-20 DIAGNOSIS — Z0000 Encounter for general adult medical examination without abnormal findings: Secondary | ICD-10-CM

## 2021-08-20 DIAGNOSIS — F322 Major depressive disorder, single episode, severe without psychotic features: Secondary | ICD-10-CM | POA: Diagnosis not present

## 2021-08-20 DIAGNOSIS — C20 Malignant neoplasm of rectum: Secondary | ICD-10-CM

## 2021-08-20 DIAGNOSIS — F329 Major depressive disorder, single episode, unspecified: Secondary | ICD-10-CM

## 2021-08-20 MED ORDER — FLUOXETINE HCL 20 MG PO CAPS: 20.0000 mg | ORAL_CAPSULE | Freq: Every day | ORAL | 4 refills | Status: DC

## 2021-08-20 NOTE — Patient Instructions (Signed)
Deborah Arroyo  It was a pleasure seeing you in the clinic today.   We talked about mental health and life stress as well as smoking.  Mental health You will follow with our behavorial health clinic as well as start a medication called prozac. Give this medication 4-6 weeks to see the full effect. If you are still having symptoms we can go up on the dose at your next visit.  2. Smoking Based on your history of smoking I would like to get some tests done to see how well you are able to breath. Based on the results of these tests we may be able to see if you would benefit from an inhaler.  Please call our clinic at [phone] if you have any questions or concerns. The best time to call is Monday-Friday from 9am-4pm, but there is someone available 24/7 at the same number. If you need medication refills, please notify your pharmacy one week in advance and they will send [place] a request.   Thank you for letting us take part in your care. We look forward to seeing you next time!

## 2021-08-20 NOTE — Assessment & Plan Note (Signed)
Patient has done both radiation and chemo, continues to follow with oncology

## 2021-08-20 NOTE — Assessment & Plan Note (Signed)
Patient had emphysema seen on CT in the past and continues to smoke 1-1.5 PPD. Has a difficulty with exertion/shortness of breath and feels like she has some dyspnea.  - PFT - discuss inhalers pending results

## 2021-08-20 NOTE — Progress Notes (Signed)
   CC: Life stress  HPI:  [name] is a 70 y.o. PMH noted below, who presents to the [facility] with complaints of life stress. To see the management of his acute and chronic conditions, please refer to the A&P note under the encounters tab.   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   Hypoglycemia    Hyponatremia    syncope with this and admitted twice due to this issue    Osteopenia    Osteoporosis    Rectal bleeding    Rectal cancer s/p LAR/diverting loop ileostomy 07/12/2018 03/06/2018   Smoker    Review of Systems:  positive for anxiety and depression, shortness of breath  Physical Exam: Gen: Thin elderly female in NAD HEENT: normocephalic atraumatic, MMM, neck supple CV: RRR, no m/r/g   Resp: CTAB, normal WOB GI: soft, nontender MSK: moves all extremities without difficulty Skin:warm and dry Neuro:alert answering questions appropriately Psych: sad affect   Assessment & Plan:   See Encounters Tab for problem based charting.  Patient seen with Dr. Philipp Ovens

## 2021-08-20 NOTE — Assessment & Plan Note (Signed)
Patient still smoking, says that smoking provides a stress relief for her. Is healing with a lot of life stress right now. Not interested in quitting at this time. Discussed plan to get stress levels/mental health under better control and revisit this at next visit.

## 2021-08-20 NOTE — Assessment & Plan Note (Signed)
Declined flu shot

## 2021-08-20 NOTE — Assessment & Plan Note (Signed)
Last DEXA showed improvement of her bone density.  - consider checking vitamin D at next visit -Continue to take alendronate

## 2021-08-20 NOTE — Assessment & Plan Note (Signed)
Patient disclosed that there is a lot of stress in her life surrounding money and housing instability. She was teary on exam, and says that it has been a lot to deal with and she has been struggling. Denies any thoughts of SI/HI. Would like to talk with someone and is open to starting a medication. - start prozac 20 - titrate up as needed in 4 weeks

## 2021-08-25 NOTE — Progress Notes (Signed)
Internal Medicine Clinic Attending  I saw and evaluated the patient.  I personally confirmed the key portions of the history and exam documented by Dr. DeMaio   and I reviewed pertinent patient test results.  The assessment, diagnosis, and plan were formulated together and I agree with the documentation in the resident's note.  

## 2021-08-31 ENCOUNTER — Other Ambulatory Visit: Payer: Self-pay | Admitting: Internal Medicine

## 2021-09-01 ENCOUNTER — Encounter (HOSPITAL_COMMUNITY): Payer: Medicare Other

## 2021-09-01 LAB — SARS CORONAVIRUS 2 (TAT 6-24 HRS): SARS Coronavirus 2: NEGATIVE

## 2021-09-02 ENCOUNTER — Ambulatory Visit (HOSPITAL_COMMUNITY)
Admission: RE | Admit: 2021-09-02 | Discharge: 2021-09-02 | Disposition: A | Discharge status: Home / Self Care | Payer: Medicare Other | Source: Ambulatory Visit | Attending: Internal Medicine | Admitting: Internal Medicine

## 2021-09-02 ENCOUNTER — Other Ambulatory Visit: Payer: Self-pay

## 2021-09-02 DIAGNOSIS — J439 Emphysema, unspecified: Secondary | ICD-10-CM | POA: Insufficient documentation

## 2021-09-02 LAB — PULMONARY FUNCTION TEST
DL/VA % pred: 46 %
DL/VA: 1.98 ml/min/mmHg/L
DLCO unc % pred: 38 %
DLCO unc: 6.69 ml/min/mmHg
FEF 25-75 Post: 0.82 L/sec
FEF 25-75 Pre: 0.86 L/sec
FEF2575-%Change-Post: -4 %
FEF2575-%Pred-Post: 48 %
FEF2575-%Pred-Pre: 51 %
FEV1-%Change-Post: 0 %
FEV1-%Pred-Post: 66 %
FEV1-%Pred-Pre: 67 %
FEV1-Post: 1.31 L
FEV1-Pre: 1.32 L
FEV1FVC-%Change-Post: -4 %
FEV1FVC-%Pred-Pre: 92 %
FEV6-%Change-Post: 3 %
FEV6-%Pred-Post: 77 %
FEV6-%Pred-Pre: 74 %
FEV6-Post: 1.91 L
FEV6-Pre: 1.84 L
FEV6FVC-%Change-Post: 2 %
FEV6FVC-%Pred-Post: 104 %
FEV6FVC-%Pred-Pre: 102 %
FVC-%Change-Post: 3 %
FVC-%Pred-Post: 74 %
FVC-%Pred-Pre: 72 %
FVC-Post: 1.95 L
FVC-Pre: 1.88 L
Post FEV1/FVC ratio: 67 %
Post FEV6/FVC ratio: 100 %
Pre FEV1/FVC ratio: 70 %
Pre FEV6/FVC Ratio: 98 %
RV % pred: 116 %
RV: 2.39 L
TLC % pred: 97 %
TLC: 4.49 L

## 2021-09-02 MED ORDER — ALBUTEROL SULFATE (2.5 MG/3ML) 0.083% IN NEBU
2.5000 mg | INHALATION_SOLUTION | Freq: Once | RESPIRATORY_TRACT | Status: AC
  Administered 2021-09-02: 2.5 mg via RESPIRATORY_TRACT

## 2021-09-04 ENCOUNTER — Telehealth: Payer: Self-pay

## 2021-09-04 NOTE — Telephone Encounter (Signed)
PFT's done on 11/2, routing to yellow team

## 2021-09-04 NOTE — Telephone Encounter (Signed)
Requesting test results, please call pt back.  

## 2021-09-08 NOTE — Telephone Encounter (Signed)
Pt calling back requesting the doctor to call her back about her test results, please call pt back.

## 2021-09-09 ENCOUNTER — Telehealth: Payer: Self-pay | Admitting: Internal Medicine

## 2021-09-09 MED ORDER — ALBUTEROL SULFATE HFA 108 (90 BASE) MCG/ACT IN AERS: 2.0000 | INHALATION_SPRAY | Freq: Four times a day (QID) | RESPIRATORY_TRACT | 2 refills | Status: DC | PRN

## 2021-09-09 MED ORDER — SPIRIVA RESPIMAT 2.5 MCG/ACT IN AERS: 2.0000 | INHALATION_SPRAY | Freq: Every day | RESPIRATORY_TRACT | 0 refills | Status: DC

## 2021-09-09 NOTE — Telephone Encounter (Signed)
PT  is requesting a call back .. she stated she has called before about her results  .. and no one has called her back about the results .. pt stated that if no one calls her back DO NOT SET HER UP WITH NO MORE TEST IF NOT ONE WILL GIVE HER RESULTS .

## 2021-09-09 NOTE — Telephone Encounter (Signed)
I contacted Deborah Arroyo to discuss her PFT results. I asked patient what her understanding is regarding why these tests were ordered. She states "I dont know, ask her [Dr. Demaio]." We discussed her long smoking history and symptoms of SOB and DOE. Deborah Arroyo agreed her symptoms have been bothersome. I explained her PFT results demonstrate moderate COPD with concern for hypoxemia. Deborah Arroyo states she "figured as much." We discussed the importance of quitting smoking and she is adamantly not interested at this time. She would like her to have her oxygen levels checked and understands that if she requires supplemental oxygen at home, she cannot smoke with the tank near her.   Assessment/Plan:  # COPD Gold Stage B Findings demonstrate moderate obstruction (FEV1 of 67), however FEV1/FVC is normal at 70. With the severe diffusion defect, I wonder if there may be a restrictive process. 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   - Will send in Albuterol, use every 4-6 hours PRN - Will send in Spiriva  - Follow up in 1 week

## 2021-09-14 ENCOUNTER — Institutional Professional Consult (permissible substitution): Payer: Medicare Other | Admitting: Behavioral Health

## 2021-09-16 ENCOUNTER — Ambulatory Visit (INDEPENDENT_AMBULATORY_CARE_PROVIDER_SITE_OTHER): Payer: Medicare Other | Admitting: Internal Medicine

## 2021-09-16 ENCOUNTER — Encounter: Payer: Self-pay | Admitting: Internal Medicine

## 2021-09-16 VITALS — BP 130/74 | HR 71 | Temp 97.9°F | Ht 61.0 in | Wt 125.2 lb

## 2021-09-16 DIAGNOSIS — J432 Centrilobular emphysema: Secondary | ICD-10-CM

## 2021-09-16 DIAGNOSIS — M62838 Other muscle spasm: Secondary | ICD-10-CM

## 2021-09-16 DIAGNOSIS — M2550 Pain in unspecified joint: Secondary | ICD-10-CM

## 2021-09-16 DIAGNOSIS — F329 Major depressive disorder, single episode, unspecified: Secondary | ICD-10-CM | POA: Diagnosis not present

## 2021-09-16 MED ORDER — ALBUTEROL SULFATE (2.5 MG/3ML) 0.083% IN NEBU: 2.5000 mg | INHALATION_SOLUTION | RESPIRATORY_TRACT | 2 refills | Status: DC | PRN

## 2021-09-16 MED ORDER — FLUOXETINE HCL 40 MG PO CAPS: 40.0000 mg | ORAL_CAPSULE | Freq: Every day | ORAL | 3 refills | Status: DC

## 2021-09-16 NOTE — Patient Instructions (Addendum)
It was nice seeing you today! Thank you for choosing [facility] for your Primary Care.    Today we talked about:   COPD:  Start using the Spiriva inhaler - use 2 puffs daily. If this inhaler is difficult for you, please let me know! I have sent in the orders for the albuterol and nebulizer machine Shoulder and Hip pain:  I have ordered some blood work and will call you with the results  I have ordered a referral to sports medicine Stress and Anxiety:  Increase Prozac to 40 mg daily   Follow up in 4 weeks

## 2021-09-16 NOTE — Progress Notes (Signed)
SATURATION QUALIFICATIONS: (This note is used to comply with regulatory documentation for home oxygen)  Patient Saturations on Room Air at Rest = 94% pulse 77  Patient Saturations on Room Air while Ambulating = 94% pulse 96

## 2021-09-16 NOTE — Progress Notes (Addendum)
CC: COPD; polyarthralgia  HPI:  [name] is a 70 y.o. with a PMHx as listed below who presents to the clinic for COPD; polyarthralgia.   Please see the Encounters tab for problem-based Assessment & Plan regarding status of patient's acute and chronic conditions.  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  Respiratory:  Positive for cough, shortness of breath and wheezing.   Cardiovascular:  Negative for chest pain.  Gastrointestinal:  Negative for abdominal pain, diarrhea, nausea and vomiting.  Musculoskeletal:  Positive for joint pain, myalgias and neck pain. Negative for falls.   Physical Exam:  Vitals:   09/16/21 1420  BP: 130/74  Pulse: 71  Temp: 97.9 F (36.6 C)  TempSrc: Oral  SpO2: 96%  Weight: 125 lb 3.2 oz (56.8 kg)  Height: 5\' 1"  (1.549 m)   Physical Exam Vitals and nursing note reviewed.  Constitutional:      General: She is not in acute distress.    Appearance: She is normal weight.  Cardiovascular:     Rate and Rhythm: Normal rate and regular rhythm.     Heart sounds: No murmur heard. Pulmonary:     Effort: Pulmonary effort is normal. No respiratory distress.     Breath sounds: Rhonchi (L>R) present. No wheezing or rales.  Abdominal:     General: Bowel sounds are normal.     Palpations: Abdomen is soft.  Musculoskeletal:     Right shoulder: Tenderness  (anterior tenderness with internal rotation) present. No swelling, deformity, effusion, laceration or crepitus. Decreased range of motion (Internal rotation limited by pain).     Left shoulder: No deformity, effusion, tenderness or bony tenderness. Normal range of motion.     Right upper arm: Normal.     Left upper arm: Normal.     Right hip: Tenderness (Overlying the greater trochanter on palpation. Pain with all ROM but particularly internal and external rotation.) present. No deformity, lacerations or crepitus. Normal strength.     Left hip: Tenderness (Overlying the greater trochanter on palpation. Pain with all ROM but particularly internal and external rotation.) present. No deformity, lacerations or crepitus. Normal strength.     Right lower leg: Normal. No edema.     Left lower leg: Normal. No edema.     Comments: Right trapezius with muscle spasm  Skin:    General: Skin is warm and dry.  Neurological:     General: No focal deficit present.     Mental Status: She is alert and oriented to person, place, and time. Mental status is at baseline.     Motor: No weakness.     Gait: Gait normal.     Deep Tendon Reflexes: Reflexes normal.  Psychiatric:        Mood and Affect:  Mood normal.        Behavior: Behavior normal.    Assessment & Plan:   See Encounters Tab for problem based charting.  Patient discussed with Dr. Jimmye Norman

## 2021-09-17 ENCOUNTER — Telehealth: Payer: Self-pay | Admitting: *Deleted

## 2021-09-17 DIAGNOSIS — M2550 Pain in unspecified joint: Secondary | ICD-10-CM | POA: Insufficient documentation

## 2021-09-17 DIAGNOSIS — M62838 Other muscle spasm: Secondary | ICD-10-CM | POA: Insufficient documentation

## 2021-09-17 LAB — SEDIMENTATION RATE: Sed Rate: 19 mm/hr (ref 0–40)

## 2021-09-17 LAB — C-REACTIVE PROTEIN: CRP: 10 mg/L (ref 0–10)

## 2021-09-17 LAB — TSH: TSH: 1.48 u[IU]/mL (ref 0.450–4.500)

## 2021-09-17 NOTE — Assessment & Plan Note (Addendum)
Ms. Rico Junker states that she has been experiencing bilateral shoulder pain for approximately 1 year and in the last 3 to 4 months, she began to experience bilateral hip pain.  She denies any joint swelling or erythema.  She denies any injuries or falls in the last year.  She finds that her right shoulder and her left hip are the most severe and have decreased range of motion secondary to pain.  In regards to her hip, she has difficulty climbing the stairs as it hurts when she pushes down to lift her self up.  With her shoulder, she is having pain when trying to reach behind her.  Assessment/plan: Differential at this time is broad and difficult to discern if each of these joint pains are related or separate.  We will check for polymyalgia rheumatica given that the large joints are all involved.  Given history of rectal cancer, there was initial concern that symptoms may be secondary to metastasis however patient recently had a CT chest/abdomen/pelvis with contrast that did not have any evidence of osseous lesions.  On examination, patient does have findings concerning for rotator cuff injury on the right.    Her left hip examination is odd as she has both greater trochanter tenderness to palpation as well as pain with full range of motion, in particular internal and external rotation.  Per chart review, looks like patient had similar concerns back in January 2022 at which time x-rays were obtained and were both negative.  Given the overall mixed picture, we will send a referral to sports medicine for consideration of in office ultrasounds with potential intra-articular injections.  Overall, patient is a poor surgical candidate should she ever need surgery in the future.  - Conservative management with Tylenol - Referral to sports medicine placed  Addendum: ESR and CRP are negative, ruling out PMR.  TSH is within normal limits.

## 2021-09-17 NOTE — Assessment & Plan Note (Signed)
Deborah Arroyo states she has been having right-sided neck pain for several weeks now.  It seems to radiate somewhat into her right shoulder.  She feels that her muscles are tense in that area.  Assessment/plan: On examination, patient has a muscle spasm in the right trapezius.  Recommended topical treatments and heat.  - Topical treatments such as Biofreeze patches versus Tiger balm patches versus Salonpas patches - Tylenol as needed - Follow-up as needed

## 2021-09-17 NOTE — Assessment & Plan Note (Signed)
Deborah Arroyo states that she continues to have daily shortness of breath that is worsened with any exertion.  It does limit the amount of activity she can do such as grocery shopping.  She has a chronic dry cough as well.  She continues to smoke daily.  She is not interested in smoking cessation at this time.  She has been using her albuterol inhaler but having difficulty with it as when she tries to inhale, it causes a coughing spell. Due to this, she there many times she needs to use her albuterol inhaler but is unable to.  She estimates that she would require her inhaler at least a couple times per day.  She has yet to start using her Spiriva inhaler, but states her sister who is familiar with inhaler use is going to teach her how to use it today.  Assessment/plan: Per PFTs and patient symptoms, consistent with Gold stage B.  Findings demonstrate moderate obstruction (FEV1 of 67), however FEV1/FVC is normal at 70. With the severe diffusion defect, I wonder if there may be a restrictive process. 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 Ambulatory oxygen saturations within normal limits at this time although patient was symptomatic.    - Albuterol inhaler and DME order for nebulizer machine  - Start Spiriva.  Contact the clinic of any difficulty with use - 4 week follow up

## 2021-09-17 NOTE — Telephone Encounter (Signed)
Carola Rhine, RN; Seward Carol, Danton Sewer; 1 other  Received! Thank you!

## 2021-09-17 NOTE — Assessment & Plan Note (Signed)
[facility] Office Visit from 09/16/2021 in [facility]  PHQ-9 Total Score 9     Deborah Arroyo states that she still is experiencing some depression and anxiety and she does not feel a significant improvement with Prozac.  She has been on it for approximately 4 weeks.  She denies any SI or HI.  Assessment/plan: - Continue to titrate Prozac to 40 mg daily - Follow-up in 4 to 6 weeks

## 2021-09-17 NOTE — Telephone Encounter (Signed)
CM sent to Stephannie Peters at [facility] for Nebulizer/supplies. F2F was 09/16/21.

## 2021-09-17 NOTE — Addendum Note (Signed)
Addended by: Jose Persia on: 09/17/2021 02:39 PM   Modules accepted: Orders

## 2021-09-18 DIAGNOSIS — J432 Centrilobular emphysema: Secondary | ICD-10-CM | POA: Diagnosis not present

## 2021-09-18 DIAGNOSIS — D123 Benign neoplasm of transverse colon: Secondary | ICD-10-CM | POA: Diagnosis not present

## 2021-09-18 DIAGNOSIS — D124 Benign neoplasm of descending colon: Secondary | ICD-10-CM | POA: Diagnosis not present

## 2021-09-21 NOTE — Progress Notes (Signed)
Internal Medicine Clinic Attending  Case discussed with Dr. Basaraba  At the time of the visit.  We reviewed the resident's history and exam and pertinent patient test results.  I agree with the assessment, diagnosis, and plan of care documented in the resident's note.  

## 2021-09-28 ENCOUNTER — Ambulatory Visit (INDEPENDENT_AMBULATORY_CARE_PROVIDER_SITE_OTHER): Payer: Medicare Other | Admitting: Family Medicine

## 2021-09-28 VITALS — BP 130/82 | Ht 61.0 in | Wt 120.0 lb

## 2021-09-28 DIAGNOSIS — M25552 Pain in left hip: Secondary | ICD-10-CM

## 2021-09-28 DIAGNOSIS — M25551 Pain in right hip: Secondary | ICD-10-CM | POA: Diagnosis not present

## 2021-09-28 DIAGNOSIS — M542 Cervicalgia: Secondary | ICD-10-CM

## 2021-09-28 NOTE — Patient Instructions (Addendum)
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 Start physical therapy and do home exercises/stretches on days you don't go to therapy. Take acetaminophen as needed as you have been. Heat 15 minutes at a time 3-4 times a day. Topical biofreeze or aspercreme up to 4 times a day as needed topically. Consider injection for hips, imaging of neck if these areas are not improving as expected. Follow up with me in about 5-6 weeks for reevaluation.

## 2021-09-28 NOTE — Progress Notes (Signed)
Deborah Arroyo - 70 y.o. female MRN [id number]  Date of birth: 1951-09-20  SUBJECTIVE:   CC: neck, R shoulder, bilateral hip L >R pain   Deborah Arroyo is a 70 year old Caucasian female with past medical history of osteoporosis, tobacco use, trapezius muscle spasm, rectal cancer status post surgical resection 2019, who presents for neck, right shoulder, bilateral hip pain.  Patient reports right neck and right shoulder pain started about a year ago.  She has been told she has tight neck muscles in the past though feels her neck pain is worsening and is now more constant.  She has tried [employer] and Tylenol for pain in the past.  She feels her right shoulder pain is separate from her right neck pain.  Her shoulder pain is worse with internal rotation and activities that require shoulder abduction.  She denies pain that runs past her elbow, tingling, weakness, numbness.  She has had hip pain for the last 2 years, localizing to the lateral hip, L >R.  She describes pain with hip flexion.  Her pain is worse at night and in the mornings.  ROS: Negative aside from above.  DATA REVIEWED:  DG cervical spine: Normal spondylolysis without acute findings By: Richardean Sale M.D.   On: 11/12/2020 16:56  DG HIP (WITH OR WITHOUT PELVIS) 2-3V LEFT The mineralization and alignment are normal. There is no evidence of acute fracture or dislocation. No evidence of femoral head avascular necrosis or significant arthropathy. Rectal anastomosis clips are noted.  By: Richardean Sale M.D.   On: 11/12/2020 16:54  DG HIP (WITH OR WITHOUT PELVIS) 2-3V RIGHT The mineralization and alignment are normal. There is no evidence of acute fracture or dislocation. No evidence of femoral head avascular necrosis or significant arthropathy. Rectal anastomosis clips are noted. By: Richardean Sale M.D.   On: 11/12/2020 16:55  CT CHEST, ABDOMEN, AND PELVIS WITH CONTRAST Status post left hemicolectomy. No evidence of  recurrent or metastatic disease. By: Julian Hy M.D.   On: 06/14/2021 20:39  PHYSICAL EXAM:  VS: BP:130/82  HR: bpm  TEMP: ( )  RESP:   HT:5\' 1"  (154.9 cm)   WT:120 lb (54.4 kg)  BMI:22.69 PHYSICAL EXAM: Gen: NAD, alert, cooperative with exam, well-appearing HEENT: clear conjunctiva, nontraumatic CV:  no peripheral edema, extremities well perfused Resp: non-labored breathing on room air Skin: no rashes, normal turgor  Neuro: no gross deficits  Psych:  alert and oriented MSK:  Neck: Appearance neck normal.  Neck ROM limited with rotation towards affected side (R). Mild tenderness to palpation of cervical spinous processes.  Spurling test negative.  Tender right and left trapezius with R >L.  Strength 5/5 bilateral upper extremities.  Sensation intact bilaterally.   R Shoulder: Normal right shoulder appearance without scapular winging.  Full ROM right shoulder.  Tenderness to palpation anterior lateral right shoulder. Pain with internal and external rotation with associated neck pain.  Strength 5/5 all motions.  Pain with Michel Bickers, empty can test the patient primarily has associated neck pain with these movements.  Hips: No deformity.  Tender to palpation over greater trochanter bilaterally, L greater than R.  No tenderness over SI, piriformis.  Pain with left leg internal rotation. Pain with bilateral hip abduction, 3/5 strength hip abduction bilaterally. Negative straight leg raise, logroll bilaterally.  Equivocal Ober's on right.    ASSESSMENT & PLAN:   Deborah Arroyo is a 70 year old Caucasian female with past medical history of osteoporosis,  tobacco use, trapezius muscle spasm, rectal cancer status post surgical resection 2019, who presents for neck, right shoulder, bilateral hip pain.  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   Patient does have pain with exam maneuvers testing for rotator cuff impingement though describes much of her pain is in her neck as well as her shoulder.  Would not recommend muscle relaxers or neuropathic pain medication in patient over the age of 70.    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  Also considered hip pain secondary to osteoarthritis though x-ray from January 2022 without significant osteoarthritis.  Also consider piriformis syndrome, lumbar radiculopathy, sacroiliitis, inflamed tensor fascia lata or IT band though these are less likely given exam findings.  Unfortunately patient has allergy to Aleve which caused swelling of eyes and throat which limits our medication options.  Patient prefers to avoid injections at this time.  Plan: -Tylenol as needed -Heat, Biofreeze or Aspercreme as needed -Physical therapy eval and treat -Consider steroid injections for her hips or further imaging of the neck if these problems do not improve -Follow-up in 5 to 6 weeks for reevaluation  Portions of this report may have been transcribed using voice recognition software. Every effort was made to ensure accuracy; however, inadvertent computerized transcription errors may be present.   Wayland Denis, MD 09/28/21,  4:28 PM Pager: [phone] Internal Medicine Resident, PGY-1 [facility]

## 2021-09-29 ENCOUNTER — Encounter: Payer: Self-pay | Admitting: Family Medicine

## 2021-10-07 ENCOUNTER — Other Ambulatory Visit: Payer: Self-pay

## 2021-10-07 ENCOUNTER — Encounter: Payer: Self-pay | Admitting: Physical Therapy

## 2021-10-07 ENCOUNTER — Ambulatory Visit: Payer: Medicare Other | Attending: Family Medicine | Admitting: Physical Therapy

## 2021-10-07 DIAGNOSIS — M25552 Pain in left hip: Secondary | ICD-10-CM | POA: Diagnosis not present

## 2021-10-07 DIAGNOSIS — M542 Cervicalgia: Secondary | ICD-10-CM | POA: Diagnosis not present

## 2021-10-07 DIAGNOSIS — R262 Difficulty in walking, not elsewhere classified: Secondary | ICD-10-CM | POA: Diagnosis not present

## 2021-10-07 DIAGNOSIS — M25511 Pain in right shoulder: Secondary | ICD-10-CM | POA: Insufficient documentation

## 2021-10-07 DIAGNOSIS — M6281 Muscle weakness (generalized): Secondary | ICD-10-CM | POA: Diagnosis not present

## 2021-10-07 DIAGNOSIS — M25551 Pain in right hip: Secondary | ICD-10-CM

## 2021-10-07 DIAGNOSIS — G8929 Other chronic pain: Secondary | ICD-10-CM | POA: Diagnosis not present

## 2021-10-07 NOTE — Therapy (Signed)
[place] [place], [place], [address] Phone: [phone]   Fax:  [phone]  Physical Therapy Evaluation  Patient Details  Name: Deborah Arroyo MRN: [id number] Date of Birth: April 12, 1951 Referring Provider (PT): Karlton Lemon   Encounter Date: 10/07/2021   PT End of Session - 10/07/21 1616     Visit Number 1    Number of Visits 24    Date for PT Re-Evaluation 12/30/21    PT Start Time 1231    PT Stop Time 1319    PT Time Calculation (min) 48 min    Activity Tolerance Patient limited by pain    Behavior During Therapy [facility] for tasks assessed/performed             Past Medical History:  Diagnosis Date   Benign neoplasm of descending colon    Benign neoplasm of transverse colon    Bowel obstruction ([place])    Cancer ([place]) 03/01/2018   rectal cancer=had chemo pills and radation    Cataract    Cervix cancer ([place]) 1979   Depression    Dyspnea    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   Hypoglycemia    Hyponatremia    syncope with this and admitted twice due to this issue    Osteopenia    Osteoporosis    Rectal bleeding    Rectal cancer s/p LAR/diverting loop ileostomy 07/12/2018 03/06/2018   Smoker     Past Surgical History:  Procedure Laterality Date   ABDOMINAL HYSTERECTOMY     APPENDECTOMY     BIOPSY  07/04/2018   Procedure: BIOPSY;  Surgeon: Jerene Bears, MD;  Location: [facility];  Service: Gastroenterology;;   CHOLECYSTECTOMY     COLONOSCOPY  03/01/2018   Dr.Pyrtle   COLONOSCOPY WITH PROPOFOL N/A 07/04/2018   Procedure: COLONOSCOPY WITH PROPOFOL;  Surgeon: Jerene Bears, MD;  Location: [facility];  Service: Gastroenterology;  Laterality: N/A;   CYSTOSCOPY WITH STENT PLACEMENT Bilateral 07/12/2018   Procedure: CYSTOSCOPY WITH STENT PLACEMENT;  Surgeon: Lucas Mallow, MD;  Location: [facility];  Service: Urology;  Laterality: Bilateral;   DIVERTING ILEOSTOMY N/A 07/12/2018   Procedure:  DIVERTING LOOP ILEOSTOMY;  Surgeon: Ileana Roup, MD;  Location: [facility];  Service: General;  Laterality: N/A;   EYE SURGERY     FLEXIBLE SIGMOIDOSCOPY N/A 07/12/2018   Procedure: FLEXIBLE SIGMOIDOSCOPY;  Surgeon: Ileana Roup, MD;  Location: [facility];  Service: General;  Laterality: N/A;   FLEXIBLE SIGMOIDOSCOPY N/A 12/04/2018   Procedure: FLEXIBLE SIGMOIDOSCOPY;  Surgeon: Ileana Roup, MD;  Location: [facility];  Service: General;  Laterality: N/A;   ILEO LOOP COLOSTOMY CLOSURE N/A 04/20/2019   Procedure: OPEN TAKEDOWN OF ILEOSTOMY, primary repair of parastomal hernia;  Surgeon: Ileana Roup, MD;  Location: [facility];  Service: General;  Laterality: N/A;   LAPAROSCOPIC LOW ANTERIOR RESECTION N/A 07/12/2018   Procedure: LAPAROSCOPIC LOW ANTERIOR RESECTION WITH COLOPROSTOSTOMY, ERAS PATHWAY;  Surgeon: Ileana Roup, MD;  Location: [facility];  Service: General;  Laterality: N/A;   POLYPECTOMY  07/04/2018   Procedure: POLYPECTOMY;  Surgeon: Jerene Bears, MD;  Location: [facility];  Service: Gastroenterology;;   SHOULDER SURGERY Left    Rotary Cuff Tear   TUBAL LIGATION      There were no vitals filed for this visit.    Subjective Assessment - 10/07/21 1233     Subjective Patient presents today with R shoulder and neck pain as  well as B hip pain. she reports the neck and shoulder pain are worst, shooting up into her head, interupting sleep.    Limitations Sitting;Lifting;Standing;Walking;Writing;House hold activities    How long can you sit comfortably? Cannot sit comfortably.    How long can you stand comfortably? standing does not bother her too much.    How long can you walk comfortably? B hip pain (L> R) limits distance.    Diagnostic tests Michel Bickers, empty can test (+), but pain was in neck  X ray1/22 neck, min spondylosis, B hips 2/22 no acute findings    Patient Stated Goals Decrease pain in neck and hips to allow her to go back to her normal routine.     Currently in Pain? Yes    Pain Score 8     Pain Location Neck    Pain Orientation Right    Pain Descriptors / Indicators Shooting;Sharp    Pain Type Chronic pain    Pain Radiating Towards shoulder into head on R.    Pain Onset More than a month ago    Pain Frequency Constant    Aggravating Factors  Sleeping if she can't find the right head position.    Pain Relieving Factors Patches provide some temporary relief.    Effect of Pain on Daily Activities Limits all activities.    Multiple Pain Sites Yes    Pain Score 6    Pain Location Hip    Pain Orientation Left    Pain Descriptors / Indicators Aching    Pain Type Chronic pain    Pain Radiating Towards down to foot.    Pain Onset More than a month ago    Pain Frequency Constant    Aggravating Factors  Walking, steps    Pain Relieving Factors decrease activity.    Effect of Pain on Daily Activities liits mobility                [facility] PT Assessment - 10/07/21 0001       Assessment   Referring Provider (PT) Shane Hudnall      Balance Screen   Has the patient fallen in the past 6 months No      [place] residence    Living Arrangements Children    Available Help at Discharge Family    Type of [place] to enter    Entrance Stairs-Number of Steps 3    [place] to live on main level with bedroom/bathroom    [place] - 4 wheels;[place] - quad      Prior Function   Level of Independence Independent    Leisure Read, TV,      Cognition   Overall Cognitive Status Within Functional Limits for tasks assessed      Posture/Postural Control   Posture Comments R shoulder maintained elevated position.      ROM / Strength   AROM / PROM / Strength AROM;Strength      AROM   Overall AROM Comments BUE ROM WFL, but painful. All hip motions limited by pain.    AROM Assessment Site Hip;Knee;Cervical    Right/Left Hip  Right;Left    Right Hip Flexion 110    Right Hip External Rotation  30    Right Hip Internal Rotation  25    Left Hip Flexion 100    Left Hip External Rotation  20  Left Hip Internal Rotation  20    Cervical Flexion wnl    Cervical Extension 70%    Cervical - Right Side Bend 50%    Cervical - Left Side Bend 90%    Cervical - Right Rotation 70%    Cervical - Left Rotation 90%      Strength   Overall Strength Comments Strength assessment limited due to severe pain when resistance provided to nearly any UE or LE movment. At least 3/5 throughout. Patient guards at all times in neck and R shoulder.      Palpation   Palpation comment Patient is TTP in neck, R shoulder, especially infraspinatous, bicipital groove, traps, and pects. Reports tingling at times when pects are stretched.      Ambulation/Gait   Ambulation/Gait Yes    Ambulation/Gait Assistance 7: Independent    Ambulation Distance (Feet) 100 Feet    Assistive device None    Gait Pattern Step-through pattern;Decreased arm swing - right;Decreased arm swing - left;Decreased step length - right;Decreased step length - left;Shuffle    Ambulation Surface Level                        Objective measurements completed on examination: See above findings.                PT Education - 10/07/21 1450     Education Details POC. Educated to gentle seated shoulder depression by placing hands in ER behind her and pushing gently while lifting chest.    Person(s) Educated Patient    Methods Explanation;Demonstration    Comprehension Returned demonstration;Verbalized understanding              PT Short Term Goals - 10/07/21 1619       PT SHORT TERM GOAL #1   Title I with basic HEP    Time 4    Period Weeks    Status New    Target Date 11/04/21               PT Long Term Goals - 10/07/21 1620       PT LONG TERM GOAL #1   Title I with final HEP    Time 12    Period Weeks    Status New     Target Date 12/30/21      PT LONG TERM GOAL #2   Title Patient will be able to move neck to at least 80% of normal in all planes with <3/10 pain.    Time 12    Period Weeks    Status New    Target Date 12/30/21      PT LONG TERM GOAL #3   Title Patient will be able to move R shoulder to 80% of normal ROM in all planes, lift at least 2# in cabinet taps to demosntrate functional use, with < 3/10 pain.    Time 12    Period Weeks    Status New    Target Date 12/30/21      PT LONG TERM GOAL #4   Title Patient will perform at least 30 minutes of standingactivities wiht B hip pain < 3/10    Time 12    Period Weeks    Status New    Target Date 12/30/21      PT LONG TERM GOAL #5   Title Increase B LE strength to at least 4/5 throughout.    Time 12    Period Weeks  Status New    Target Date 12/30/21                    Plan - 10/07/21 1451     Clinical Impression Statement Patient presents today with severe pain in R shoulder and neck, L hip. She reports pain has been present for > 1 year and she finally got someone to pay attention to it. Evaluation was pretty limited due to her pain and guarding. She was particularly fearful and was not able to relax head or RUE to mobilize. She is very sore in infraspinatous and in the R biciptial groove. Scapular mobilization limited as well. She experienced episode of dizziness after cervical and R shoulder assessment in supine. She reports this has occured before, took at least 1 minut to resolve. Patient really needs PT to relax and mobilize her entire upper quadrant on R, facilitate strength and normal movement re-education, and to further assess B hips, especially L due to trochanteric pain. Need to monitor for vagal responses. She has so many abnormal movemsnts, so much spasm and tightness, and so much fear, will need to proceed slowly.    Personal Factors and Comorbidities Time since onset of injury/illness/exacerbation     Examination-Activity Limitations Bathing;Locomotion Level;Bed Mobility;Reach Overhead;Bend;Caring for Others;Sit;Sleep;Carry;Squat;Stairs;Dressing;Hygiene/Grooming;Stand;Lift    Examination-Participation Restrictions Cleaning;Meal Prep;Driving;Interpersonal Relationship;Shop;Laundry;Yard Work    Biomedical scientist High    Rehab Potential Good    PT Frequency 2x / week    PT Duration 12 weeks    PT Treatment/Interventions ADLs/Self Care Home Management;Iontophoresis 4mg /ml Dexamethasone;Gait training;Taping;Vasopneumatic Device;Vestibular;Dry needling;Passive range of motion;Patient/family education;Stair training;Functional mobility training;Manual techniques;Therapeutic activities;Therapeutic exercise;Balance training;Neuromuscular re-education;Electrical Stimulation;Ultrasound;Moist Heat;Cryotherapy    PT Next Visit Plan Continue hip assessment, very gentle cervical and shoulder mobilization, initiate HEP, FOTO    Consulted and Agree with Plan of Care Patient             Patient will benefit from skilled therapeutic intervention in order to improve the following deficits and impairments:  Abnormal gait, Decreased coordination, Decreased range of motion, Difficulty walking, Increased fascial restricitons, Impaired tone, Decreased endurance, Dizziness, Increased muscle spasms, Impaired UE functional use, Pain, Decreased activity tolerance, Decreased balance, Hypomobility, Impaired flexibility, Improper body mechanics, Postural dysfunction, Decreased strength, Decreased mobility  Visit Diagnosis: Cervicalgia  Chronic right shoulder pain  Greater trochanteric pain syndrome of both lower extremities  Muscle weakness (generalized)  Difficulty in walking, not elsewhere classified     Problem List Patient Active Problem List   Diagnosis Date Noted   Polyarthralgia 09/17/2021   Trapezius muscle spasm 09/17/2021    Major depressive disorder 08/20/2021   Emphysema/COPD ([place]) 08/20/2021   GAD (generalized anxiety disorder) 02/18/2020   Fecal incontinence with diarrhea 04/22/2019   S/P closure of ileostomy 04/20/2019   Acute right-sided low back pain without sciatica 08/10/2018   Rectal cancer s/p LAR/diverting loop ileostomy 07/12/2018 03/06/2018   Osteoporosis 12/15/2017   Rectal mass 08/25/2017   Bilateral knee pain 08/25/2017   Health care maintenance 09/04/2015   Tobacco abuse 09/04/2015    Marcelina Morel, DPT 10/07/2021, 5:42 PM  [place] [place], [place], [address] Phone: [phone]   Fax:  [phone]  Name: MAROLYN URSCHEL MRN: [id number] Date of Birth: 1951-07-29

## 2021-10-13 ENCOUNTER — Ambulatory Visit (INDEPENDENT_AMBULATORY_CARE_PROVIDER_SITE_OTHER): Payer: Medicare Other | Admitting: Internal Medicine

## 2021-10-13 ENCOUNTER — Ambulatory Visit: Payer: Medicare Other | Admitting: Physical Therapy

## 2021-10-13 ENCOUNTER — Encounter: Payer: Self-pay | Admitting: Internal Medicine

## 2021-10-13 ENCOUNTER — Other Ambulatory Visit: Payer: Self-pay

## 2021-10-13 VITALS — BP 132/81 | HR 63 | Temp 98.1°F | Ht 61.0 in | Wt 124.3 lb

## 2021-10-13 DIAGNOSIS — Z59819 Housing instability, housed unspecified: Secondary | ICD-10-CM | POA: Diagnosis not present

## 2021-10-13 DIAGNOSIS — J432 Centrilobular emphysema: Secondary | ICD-10-CM | POA: Diagnosis not present

## 2021-10-13 DIAGNOSIS — R0989 Other specified symptoms and signs involving the circulatory and respiratory systems: Secondary | ICD-10-CM

## 2021-10-13 DIAGNOSIS — F411 Generalized anxiety disorder: Secondary | ICD-10-CM | POA: Diagnosis not present

## 2021-10-13 NOTE — Patient Instructions (Addendum)
It was nice seeing you today! Thank you for choosing [facility] for your Primary Care.    Today we talked about:   We will give the Prozac some more time to work. I am glad you have re-scheduled with Dr. Theodis Shove. I have placed a referral to our social worker, Wyatt Portela, to help with your housing situation.  It is okay to take Tumeric. Its hard to say if it will help, but it will not hurt.  I will send in a different medication for your lungs that can be used in the nebulizer machine.

## 2021-10-14 ENCOUNTER — Encounter: Payer: Medicare Other | Admitting: Internal Medicine

## 2021-10-14 ENCOUNTER — Telehealth: Payer: Self-pay | Admitting: *Deleted

## 2021-10-14 DIAGNOSIS — Z59819 Housing instability, housed unspecified: Secondary | ICD-10-CM | POA: Insufficient documentation

## 2021-10-14 DIAGNOSIS — R0989 Other specified symptoms and signs involving the circulatory and respiratory systems: Secondary | ICD-10-CM | POA: Insufficient documentation

## 2021-10-14 MED ORDER — REVEFENACIN 175 MCG/3ML IN SOLN: 175.0000 ug | Freq: Every day | RESPIRATORY_TRACT | 1 refills | Status: DC

## 2021-10-14 NOTE — Assessment & Plan Note (Signed)
Ms. Rorke states that she continues to have significant anxiety that is related to her home situation.  She feels that the Prozac is helping somewhat but she would like to give additional time for the increased dose to take effect before making any additional medication changes. She denies any SI.   Assessment/plan: Uncontrolled depression and anxiety. It has been 4 weeks since Prozac dose was increased to 40 mg. Given patient preference, will hold off on any adjustments today.   - Continue working with Dr. Theodis Shove - Continue Prozac 40 mg daily - 4 week follow up.

## 2021-10-14 NOTE — Assessment & Plan Note (Signed)
Ms. Blixt states that she has been having difficulty in finding alternative housing.  The current home she lives in, she does not pay rent at this time as the owner only comes by every 3 to 4 years and has not done any improvements to the house.  Multiple walls are bowing, concerning for water damage and development of mold.  Multiple ceilings are leaking water and the rafters are coming apart.  She also has instability of the floorboard between the second and first floor.  For heating, she currently has to use kerosene.  Assessment/plan:  - Referral to social work for help in finding safer housing

## 2021-10-14 NOTE — Assessment & Plan Note (Signed)
Deborah Arroyo states that since receiving her nebulizer machine, she has been able to use albuterol daily as needed but typically only uses it at nighttime.  She has noticed improvement in her breath sounds with it as she is no longer hearing audible wheezing.  She has continued to try using Spiriva daily but is having significant difficulty in timing of inhalation and pushing the inhaler- this leads her to cough with every time she uses it and is unsure if she is inhaling the treatment.  Assessment/plan: - Continue albuterol nebulizer as needed - We will switch to a LAMA nebulizer treatment given significant difficulty with inhalers.  Start Yupelri once daily. - Discontinue Spiriva - Handicap parking pass signed today given significant shortness of breath with ambulation.  I am hoping as we advance her treatment, she will have improvement in her symptoms

## 2021-10-14 NOTE — Assessment & Plan Note (Addendum)
On examination today, notable findings for fine crackles bilaterally up to the mid lung fields. Previous imaging negative for interstitial lung disease. Will obtain CXR given concerns for mold in the home and home heating using Kerosene.   - CXR pending   ADDENDUM:  CXR without evidence of pulmonary abnormalities. Will continue to monitor closely and can consider HRCT if pulmonary exam remains unchanged.

## 2021-10-14 NOTE — Progress Notes (Signed)
° °  CC: COPD; GAD  HPI:  [name] is a 70 y.o. with a PMHx as listed below who presents to the clinic for COPD; GAD.   Please see the Encounters tab for problem-based Assessment & Plan regarding status of patient's acute and chronic conditions.  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  Respiratory:  Positive for shortness of breath and wheezing.   Gastrointestinal:  Negative for nausea and vomiting.  Psychiatric/Behavioral:  Positive for depression. Negative for hallucinations, substance abuse and suicidal ideas. The patient is nervous/anxious.    Physical Exam:  Vitals:   10/13/21 1419  BP: 132/81  Pulse: 63  Temp: 98.1 F (36.7 C)  TempSrc: Oral  SpO2: 95%  Weight: 124 lb 4.8 oz (56.4 kg)  Height: 5\' 1"  (1.549 m)   Physical Exam Vitals and nursing note reviewed.  Constitutional:      General: She is not in acute distress.    Appearance: She is normal weight.  Cardiovascular:     Rate and Rhythm: Normal rate and regular rhythm.  Pulmonary:     Effort: Pulmonary effort is normal. No respiratory distress.     Breath sounds: Rales (bilateral rales up to mid lung fields) present. No wheezing or rhonchi.  Abdominal:     General: Bowel sounds are normal.     Palpations: Abdomen is soft.  Skin:    General: Skin is warm and dry.  Neurological:     General: No focal  deficit present.     Mental Status: She is alert and oriented to person, place, and time. Mental status is at baseline.  Psychiatric:        Mood and Affect: Mood normal.        Behavior: Behavior normal.        Thought Content: Thought content normal.        Judgment: Judgment normal.    Assessment & Plan:   See Encounters Tab for problem based charting.  Patient discussed with Dr. Dareen Piano

## 2021-10-14 NOTE — Chronic Care Management (AMB) (Signed)
°  Care Management   Outreach Note  10/14/2021 Name: Deborah Arroyo MRN: [id number] DOB: 01/13/1951  Referred by: Farrel Gordon, DO Reason for referral : Care Coordination (Initial outreach to schedule referral with BSW)   An unsuccessful telephone outreach was attempted today. The patient was referred to the case management team for assistance with care management and care coordination.   Follow Up Plan:  A HIPAA compliant phone message was left for the patient providing contact information and requesting a return call.  The care management team will reach out to the patient again over the next 7 days.  If patient returns call to provider office, please advise to call [place]* at [phone].*  [facility]  Direct Dial: [phone]

## 2021-10-14 NOTE — Chronic Care Management (AMB) (Signed)
°  Care Management   Note  10/14/2021 Name: Deborah Arroyo MRN: [id number] DOB: August 19, 1951  Deborah Arroyo is a 70 y.o. year old female who is a primary care patient of Farrel Gordon, DO. I reached out to Keturah Shavers by phone today in response to a referral sent by [name] primary care provider.   [name] was given information about care management services today including:  Care management services include personalized support from designated clinical staff supervised by her physician, including individualized plan of care and coordination with other care providers 24/7 contact phone numbers for assistance for urgent and routine care needs. The patient may stop care management services at any time by phone call to the office staff.  Patient agreed to services and verbal consent obtained.   Follow up plan: Telephone appointment with care management team member scheduled for:10/20/21  [place]: [phone]

## 2021-10-15 ENCOUNTER — Ambulatory Visit (HOSPITAL_COMMUNITY)
Admission: RE | Admit: 2021-10-15 | Discharge: 2021-10-15 | Disposition: A | Discharge status: Home / Self Care | Payer: Medicare Other | Source: Ambulatory Visit | Attending: Internal Medicine | Admitting: Internal Medicine

## 2021-10-15 ENCOUNTER — Other Ambulatory Visit: Payer: Self-pay

## 2021-10-15 DIAGNOSIS — R0989 Other specified symptoms and signs involving the circulatory and respiratory systems: Secondary | ICD-10-CM

## 2021-10-15 NOTE — Progress Notes (Signed)
Internal Medicine Clinic Attending  Case discussed with Dr. Basaraba  At the time of the visit.  We reviewed the resident's history and exam and pertinent patient test results.  I agree with the assessment, diagnosis, and plan of care documented in the resident's note.  

## 2021-10-16 ENCOUNTER — Encounter: Payer: Self-pay | Admitting: Physical Therapy

## 2021-10-16 ENCOUNTER — Ambulatory Visit: Payer: Medicare Other | Admitting: Physical Therapy

## 2021-10-16 DIAGNOSIS — M542 Cervicalgia: Secondary | ICD-10-CM

## 2021-10-16 DIAGNOSIS — R262 Difficulty in walking, not elsewhere classified: Secondary | ICD-10-CM | POA: Diagnosis not present

## 2021-10-16 DIAGNOSIS — M6281 Muscle weakness (generalized): Secondary | ICD-10-CM | POA: Diagnosis not present

## 2021-10-16 DIAGNOSIS — M25511 Pain in right shoulder: Secondary | ICD-10-CM | POA: Diagnosis not present

## 2021-10-16 DIAGNOSIS — G8929 Other chronic pain: Secondary | ICD-10-CM

## 2021-10-16 DIAGNOSIS — M25552 Pain in left hip: Secondary | ICD-10-CM | POA: Diagnosis not present

## 2021-10-16 DIAGNOSIS — M25551 Pain in right hip: Secondary | ICD-10-CM | POA: Diagnosis not present

## 2021-10-16 NOTE — Therapy (Signed)
[place] [place], [place], [address] Phone: [phone]   Fax:  [phone]  Physical Therapy Treatment  Patient Details  Name: Deborah Arroyo MRN: [id number] Date of Birth: 02-01-1951 Referring Provider (PT): Karlton Lemon   Encounter Date: 10/16/2021   PT End of Session - 10/16/21 1058     Visit Number 2    Number of Visits 24    PT Start Time 1015    PT Stop Time 1059    PT Time Calculation (min) 44 min    Activity Tolerance Patient tolerated treatment well    Behavior During Therapy [facility] for tasks assessed/performed             Past Medical History:  Diagnosis Date   Benign neoplasm of descending colon    Benign neoplasm of transverse colon    Bowel obstruction ([place])    Cancer ([place]) 03/01/2018   rectal cancer=had chemo pills and radation    Cataract    Cervix cancer ([place]) 1979   Depression    Dyspnea    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   Hypoglycemia    Hyponatremia    syncope with this and admitted twice due to this issue    Osteopenia    Osteoporosis    Rectal bleeding    Rectal cancer s/p LAR/diverting loop ileostomy 07/12/2018 03/06/2018   Smoker     Past Surgical History:  Procedure Laterality Date   ABDOMINAL HYSTERECTOMY     APPENDECTOMY     BIOPSY  07/04/2018   Procedure: BIOPSY;  Surgeon: Jerene Bears, MD;  Location: [facility];  Service: Gastroenterology;;   CHOLECYSTECTOMY     COLONOSCOPY  03/01/2018   Dr.Pyrtle   COLONOSCOPY WITH PROPOFOL N/A 07/04/2018   Procedure: COLONOSCOPY WITH PROPOFOL;  Surgeon: Jerene Bears, MD;  Location: [facility];  Service: Gastroenterology;  Laterality: N/A;   CYSTOSCOPY WITH STENT PLACEMENT Bilateral 07/12/2018   Procedure: CYSTOSCOPY WITH STENT PLACEMENT;  Surgeon: Lucas Mallow, MD;  Location: [facility];  Service: Urology;  Laterality: Bilateral;   DIVERTING ILEOSTOMY N/A 07/12/2018   Procedure: DIVERTING LOOP ILEOSTOMY;  Surgeon:  Ileana Roup, MD;  Location: [facility];  Service: General;  Laterality: N/A;   EYE SURGERY     FLEXIBLE SIGMOIDOSCOPY N/A 07/12/2018   Procedure: FLEXIBLE SIGMOIDOSCOPY;  Surgeon: Ileana Roup, MD;  [facility];  Service: General;  Laterality: N/A;   FLEXIBLE SIGMOIDOSCOPY N/A 12/04/2018   Procedure: FLEXIBLE SIGMOIDOSCOPY;  Surgeon: Ileana Roup, MD;  [facility];  Service: General;  Laterality: N/A;   ILEO LOOP COLOSTOMY CLOSURE N/A 04/20/2019   Procedure: OPEN TAKEDOWN OF ILEOSTOMY, primary repair of parastomal hernia;  Surgeon: Ileana Roup, MD;  [facility];  Service: General;  Laterality: N/A;   LAPAROSCOPIC LOW ANTERIOR RESECTION N/A 07/12/2018   Procedure: LAPAROSCOPIC LOW ANTERIOR RESECTION WITH COLOPROSTOSTOMY, ERAS PATHWAY;  Surgeon: Ileana Roup, MD;  [facility];  Service: General;  Laterality: N/A;   POLYPECTOMY  07/04/2018   Procedure: POLYPECTOMY;  Surgeon: Jerene Bears, MD;  [facility];  Service: Gastroenterology;;   SHOULDER SURGERY Left    Rotary Cuff Tear   TUBAL LIGATION      There were no vitals filed for this visit.   Subjective Assessment - 10/16/21 1020     Subjective Feels better today than yesterday    Currently in Pain? Yes    Pain Score 3  Pain Location Neck    Pain Orientation Right                               [place] Adult PT Treatment/Exercise - 10/16/21 0001       Exercises   Exercises Neck;Shoulder      Neck Exercises: Machines for Strengthening   UBE (Upper Arm Bike) L1 x 3 min each      Shoulder Exercises: Seated   Row Strengthening;Both;20 reps;Theraband    Theraband Level (Shoulder Row) Level 2 (Red)    External Rotation Strengthening;Both;20 reps;Theraband    Theraband Level (Shoulder External Rotation) Level 1 (Yellow)      Shoulder Exercises: Standing   Extension Strengthening;Both;10 reps;Theraband    Theraband Level (Shoulder Extension)  Level 1 (Yellow)    Other Standing Exercises AAROM flex, Ext, IR x10      Manual Therapy   Manual Therapy Soft tissue mobilization;Passive ROM    Soft tissue mobilization Upper R trap, rhomboids,cervical para spinales    Passive ROM L lateral side bend for stretching.                       PT Short Term Goals - 10/07/21 1619       PT SHORT TERM GOAL #1   Title I with basic HEP    Time 4    Period Weeks    Status New    Target Date 11/04/21               PT Long Term Goals - 10/07/21 1620       PT LONG TERM GOAL #1   Title I with final HEP    Time 12    Period Weeks    Status New    Target Date 12/30/21      PT LONG TERM GOAL #2   Title Patient will be able to move neck to at least 80% of normal in all planes with <3/10 pain.    Time 12    Period Weeks    Status New    Target Date 12/30/21      PT LONG TERM GOAL #3   Title Patient will be able to move R shoulder to 80% of normal ROM in all planes, lift at least 2# in cabinet taps to demosntrate functional use, with < 3/10 pain.    Time 12    Period Weeks    Status New    Target Date 12/30/21      PT LONG TERM GOAL #4   Title Patient will perform at least 30 minutes of standingactivities wiht B hip pain < 3/10    Time 12    Period Weeks    Status New    Target Date 12/30/21      PT LONG TERM GOAL #5   Title Increase B LE strength to at least 4/5 throughout.    Time 12    Period Weeks    Status New    Target Date 12/30/21                   Plan - 10/16/21 1059     Clinical Impression Statement Pt enters clinic reporting decrease pain from doing HEP. Some shoulder fatigue reported with external rotation and extensions. No reports of increase pain reported during the interventions. Positive response to MT evident by reports of decrease pain and improved mobility. Sore  TP noted in the upper R trap with STM.    Personal Factors and Comorbidities Time since onset of  injury/illness/exacerbation    Examination-Activity Limitations Bathing;Locomotion Level;Bed Mobility;Reach Overhead;Bend;Caring for Others;Sit;Sleep;Carry;Squat;Stairs;Dressing;Hygiene/Grooming;Stand;Lift    Examination-Participation Restrictions Cleaning;Meal Prep;Driving;Interpersonal Relationship;Shop;Laundry;Yard Work    Merchant navy officer Evolving/Moderate complexity    Rehab Potential Good    PT Frequency 2x / week    PT Duration 12 weeks    PT Treatment/Interventions ADLs/Self Care Home Management;Iontophoresis 4mg /ml Dexamethasone;Gait training;Taping;Vasopneumatic Device;Vestibular;Dry needling;Passive range of motion;Patient/family education;Stair training;Functional mobility training;Manual techniques;Therapeutic activities;Therapeutic exercise;Balance training;Neuromuscular re-education;Electrical Stimulation;Ultrasound;Moist Heat;Cryotherapy    PT Next Visit Plan Continue hip assessment, very gentle cervical and shoulder mobilization, initiate HEP, FOTO             Patient will benefit from skilled therapeutic intervention in order to improve the following deficits and impairments:  Abnormal gait, Decreased coordination, Decreased range of motion, Difficulty walking, Increased fascial restricitons, Impaired tone, Decreased endurance, Dizziness, Increased muscle spasms, Impaired UE functional use, Pain, Decreased activity tolerance, Decreased balance, Hypomobility, Impaired flexibility, Improper body mechanics, Postural dysfunction, Decreased strength, Decreased mobility  Visit Diagnosis: Cervicalgia  Chronic right shoulder pain  Muscle weakness (generalized)  Difficulty in walking, not elsewhere classified     Problem List Patient Active Problem List   Diagnosis Date Noted   Housing situation unstable 10/14/2021   Bilateral rales 10/14/2021   Polyarthralgia 09/17/2021   Trapezius muscle spasm 09/17/2021   Major depressive disorder 08/20/2021    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 [place], [place], [address] Phone: [phone]   Fax:  [phone]  Name: Deborah Arroyo MRN: [id number] Date of Birth: [birth year]

## 2021-10-19 ENCOUNTER — Encounter: Payer: Self-pay | Admitting: Physical Therapy

## 2021-10-19 ENCOUNTER — Ambulatory Visit: Payer: Medicare Other | Admitting: Physical Therapy

## 2021-10-19 ENCOUNTER — Other Ambulatory Visit: Payer: Self-pay

## 2021-10-19 DIAGNOSIS — M25552 Pain in left hip: Secondary | ICD-10-CM | POA: Diagnosis not present

## 2021-10-19 DIAGNOSIS — M542 Cervicalgia: Secondary | ICD-10-CM | POA: Diagnosis not present

## 2021-10-19 DIAGNOSIS — M6281 Muscle weakness (generalized): Secondary | ICD-10-CM | POA: Diagnosis not present

## 2021-10-19 DIAGNOSIS — M25511 Pain in right shoulder: Secondary | ICD-10-CM | POA: Diagnosis not present

## 2021-10-19 DIAGNOSIS — R262 Difficulty in walking, not elsewhere classified: Secondary | ICD-10-CM | POA: Diagnosis not present

## 2021-10-19 DIAGNOSIS — M25551 Pain in right hip: Secondary | ICD-10-CM | POA: Diagnosis not present

## 2021-10-19 DIAGNOSIS — G8929 Other chronic pain: Secondary | ICD-10-CM

## 2021-10-19 NOTE — Therapy (Signed)
[place]. [place], [place], [address] Phone: [phone]   Fax:  [phone]  Physical Therapy Treatment  Patient Details  Name: Deborah Arroyo MRN: [id number] Date of Birth: 12/15/1950 Referring Provider (PT): Karlton Lemon   Encounter Date: 10/19/2021   PT End of Session - 10/19/21 1431     Visit Number 3    Number of Visits 24    PT Start Time 0814    PT Stop Time 1401    PT Time Calculation (min) 46 min    Activity Tolerance Patient tolerated treatment well;Patient limited by pain    Behavior During Therapy [facility] for tasks assessed/performed             Past Medical History:  Diagnosis Date   Benign neoplasm of descending colon    Benign neoplasm of transverse colon    Bowel obstruction ([place])    Cancer ([place]) 03/01/2018   rectal cancer=had chemo pills and radation    Cataract    Cervix cancer ([place]) 1979   Depression    Dyspnea    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   Hypoglycemia    Hyponatremia    syncope with this and admitted twice due to this issue    Osteopenia    Osteoporosis    Rectal bleeding    Rectal cancer s/p LAR/diverting loop ileostomy 07/12/2018 03/06/2018   Smoker     Past Surgical History:  Procedure Laterality Date   ABDOMINAL HYSTERECTOMY     APPENDECTOMY     BIOPSY  07/04/2018   Procedure: BIOPSY;  Surgeon: Jerene Bears, MD;  Location: [facility];  Service: Gastroenterology;;   CHOLECYSTECTOMY     COLONOSCOPY  03/01/2018   Dr.Pyrtle   COLONOSCOPY WITH PROPOFOL N/A 07/04/2018   Procedure: COLONOSCOPY WITH PROPOFOL;  Surgeon: Jerene Bears, MD;  [facility];  Service: Gastroenterology;  Laterality: N/A;   CYSTOSCOPY WITH STENT PLACEMENT Bilateral 07/12/2018   Procedure: CYSTOSCOPY WITH STENT PLACEMENT;  Surgeon: Lucas Mallow, MD;  [facility];  Service: Urology;  Laterality: Bilateral;   DIVERTING ILEOSTOMY N/A 07/12/2018   Procedure: DIVERTING  LOOP ILEOSTOMY;  Surgeon: Ileana Roup, MD;  [facility];  Service: General;  Laterality: N/A;   EYE SURGERY     FLEXIBLE SIGMOIDOSCOPY N/A 07/12/2018   Procedure: FLEXIBLE SIGMOIDOSCOPY;  Surgeon: Ileana Roup, MD;  [facility];  Service: General;  Laterality: N/A;   FLEXIBLE SIGMOIDOSCOPY N/A 12/04/2018   Procedure: FLEXIBLE SIGMOIDOSCOPY;  Surgeon: Ileana Roup, MD;  [facility];  Service: General;  Laterality: N/A;   ILEO LOOP COLOSTOMY CLOSURE N/A 04/20/2019   Procedure: OPEN TAKEDOWN OF ILEOSTOMY, primary repair of parastomal hernia;  Surgeon: Ileana Roup, MD;  [facility];  Service: General;  Laterality: N/A;   LAPAROSCOPIC LOW ANTERIOR RESECTION N/A 07/12/2018   Procedure: LAPAROSCOPIC LOW ANTERIOR RESECTION WITH COLOPROSTOSTOMY, ERAS PATHWAY;  Surgeon: Ileana Roup, MD;  [facility];  Service: General;  Laterality: N/A;   POLYPECTOMY  07/04/2018   Procedure: POLYPECTOMY;  Surgeon: Jerene Bears, MD;  [facility];  Service: Gastroenterology;;   SHOULDER SURGERY Left    Rotary Cuff Tear   TUBAL LIGATION      There were no vitals filed for this visit.   Subjective Assessment - 10/19/21 1318     Subjective Patient reports that she has been performing exercises at home, mostly leg exercises. Her neck is  starting to feel slightly better, but her hips are still sore.    Currently in Pain? Yes    Pain Score 4     Pain Location Neck    Pain Orientation Right    Pain Descriptors / Indicators Aching    Pain Type Chronic pain    Pain Onset In the past 7 days    Pain Frequency Constant                [facility] PT Assessment - 10/19/21 0001       Flexibility   Soft Tissue Assessment /Muscle Length yes                           [facility] Adult PT Treatment/Exercise - 10/19/21 0001       Exercises   Exercises Knee/Hip      Neck Exercises: Machines for Strengthening   UBE (Upper Arm Bike)  L1.5 x 3 min for and back      Knee/Hip Exercises: Stretches   Active Hamstring Stretch Left;3 reps;10 seconds    ITB Stretch Left;3 reps;10 seconds    Piriformis Stretch Left;3 reps;10 seconds    Other Knee/Hip Stretches Supine knee to opposite shoulder-L LE to R shouldre 3 x 10 sec      Modalities   Modalities Moist Heat      Moist Heat Therapy   Number Minutes Moist Heat 15 Minutes    Moist Heat Location Cervical      Manual Therapy   Manual Therapy Soft tissue mobilization;Passive ROM    Soft tissue mobilization Upper R trap, rhomboids,cervical para spinales    Passive ROM Chin tuck, manula traction, B rotation and side bending ROM                     PT Education - 10/19/21 1430     Education Details HEP- instructed to take all exercises easy and stop if pain increases.    Person(s) Educated Patient    Methods Explanation;Demonstration;Handout    Comprehension Returned demonstration;Verbalized understanding              PT Short Term Goals - 10/19/21 1435       PT SHORT TERM GOAL #1   Title I with basic HEP    Time 3    Period Weeks    Status On-going    Target Date 11/04/21               PT Long Term Goals - 10/07/21 1620       PT LONG TERM GOAL #1   Title I with final HEP    Time 12    Period Weeks    Status New    Target Date 12/30/21      PT LONG TERM GOAL #2   Title Patient will be able to move neck to at least 80% of normal in all planes with <3/10 pain.    Time 12    Period Weeks    Status New    Target Date 12/30/21      PT LONG TERM GOAL #3   Title Patient will be able to move R shoulder to 80% of normal ROM in all planes, lift at least 2# in cabinet taps to demosntrate functional use, with < 3/10 pain.    Time 12    Period Weeks    Status New    Target Date 12/30/21  PT LONG TERM GOAL #4   Title Patient will perform at least 30 minutes of standingactivities wiht B hip pain < 3/10    Time 12    Period Weeks     Status New    Target Date 12/30/21      PT LONG TERM GOAL #5   Title Increase B LE strength to at least 4/5 throughout.    Time 12    Period Weeks    Status New    Target Date 12/30/21                   Plan - 10/19/21 1432     Clinical Impression Statement Patient reported decreased pain, but she was extrememly sensistive to [facility] techniques to L hip-infrerior to illiac crest and piriformis. Also remained very tender to MT to upper traps, rhomboids, with trigger points remaining in bith hip and cervical areas. Therapist followed MT with stretching and then strengthening, updating HEP to facilitate stability.    Personal Factors and Comorbidities Time since onset of injury/illness/exacerbation    Examination-Activity Limitations Bathing;Locomotion Level;Bed Mobility;Reach Overhead;Bend;Caring for Others;Sit;Sleep;Carry;Squat;Stairs;Dressing;Hygiene/Grooming;Stand;Lift    Examination-Participation Restrictions Cleaning;Meal Prep;Driving;Interpersonal Relationship;Shop;Laundry;Yard Work    Biomedical scientist High    Rehab Potential Good    PT Frequency 2x / week    PT Duration 12 weeks    PT Treatment/Interventions ADLs/Self Care Home Management;Iontophoresis 4mg /ml Dexamethasone;Gait training;Taping;Vasopneumatic Device;Vestibular;Dry needling;Passive range of motion;Patient/family education;Stair training;Functional mobility training;Manual techniques;Therapeutic activities;Therapeutic exercise;Balance training;Neuromuscular re-education;Electrical Stimulation;Ultrasound;Moist Heat;Cryotherapy    PT Next Visit Plan DN, continue with TP release, stretch, strengthening.    PT Home Exercise Plan [email or web address]    Consulted and Agree with Plan of Care Patient             Patient will benefit from skilled therapeutic intervention in order to improve the following deficits and impairments:  Abnormal gait,  Decreased coordination, Decreased range of motion, Difficulty walking, Increased fascial restricitons, Impaired tone, Decreased endurance, Dizziness, Increased muscle spasms, Impaired UE functional use, Pain, Decreased activity tolerance, Decreased balance, Hypomobility, Impaired flexibility, Improper body mechanics, Postural dysfunction, Decreased strength, Decreased mobility  Visit Diagnosis: Cervicalgia  Chronic right shoulder pain  Muscle weakness (generalized)  Difficulty in walking, not elsewhere classified  Greater trochanteric pain syndrome of both lower extremities     Problem List Patient Active Problem List   Diagnosis Date Noted   Housing situation unstable 10/14/2021   Bilateral rales 10/14/2021   Polyarthralgia 09/17/2021   Trapezius muscle spasm 09/17/2021   Major depressive disorder 08/20/2021   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 [place], [place], [address] Phone: [phone]   Fax:  [phone]  Name: Deborah Arroyo MRN: [id number] Date of Birth: 1950/11/24

## 2021-10-19 NOTE — Patient Instructions (Signed)
Access Code: [email or web address] URL: [email or web address] Date: 10/19/2021 Prepared by: Ethel Rana  Exercises Shoulder External Rotation and Scapular Retraction with Resistance - 1 x daily - 7 x weekly - 1 sets - 10 reps Standing Shoulder Row with Anchored Resistance - 1 x daily - 7 x weekly - 1 sets - 10 reps Shoulder Extension with Resistance - 1 x daily - 7 x weekly - 1 sets - 10 reps Seated Scapular Retraction - 1 x daily - 7 x weekly - 1 sets - 10 reps Supine Bridge with Resistance Band - 1 x daily - 7 x weekly - 1 sets - 10 reps Supine Figure 4 Piriformis Stretch - 1 x daily - 7 x weekly - 3 sets - 10 hold Supine Hamstring Stretch - 1 x daily - 7 x weekly - 3 sets - 10 hold

## 2021-10-20 ENCOUNTER — Telehealth: Payer: Self-pay | Admitting: Licensed Clinical Social Worker

## 2021-10-20 ENCOUNTER — Telehealth: Payer: Medicare Other | Admitting: Licensed Clinical Social Worker

## 2021-10-20 NOTE — Telephone Encounter (Signed)
°  Care Management   Follow Up Note   10/20/2021 Name: KYRIANNA BARLETTA MRN: [id number] DOB: February 08, 1951   Referred by: Farrel Gordon, DO Reason for referral : No chief complaint on file.   An unsuccessful telephone outreach was attempted today. The patient was referred to the case management team for assistance with care management and care coordination.   Follow Up Plan: The care management team will reach out to the patient again over the next 30 days.   Milus Height, [place]  Social Worker [facility] Care Management  [phone]

## 2021-10-21 ENCOUNTER — Ambulatory Visit: Payer: Medicare Other | Admitting: Physical Therapy

## 2021-10-27 ENCOUNTER — Other Ambulatory Visit: Payer: Self-pay

## 2021-10-27 ENCOUNTER — Ambulatory Visit: Payer: Medicare Other | Admitting: Physical Therapy

## 2021-10-27 ENCOUNTER — Encounter: Payer: Self-pay | Admitting: Physical Therapy

## 2021-10-27 DIAGNOSIS — M542 Cervicalgia: Secondary | ICD-10-CM

## 2021-10-27 DIAGNOSIS — R262 Difficulty in walking, not elsewhere classified: Secondary | ICD-10-CM | POA: Diagnosis not present

## 2021-10-27 DIAGNOSIS — G8929 Other chronic pain: Secondary | ICD-10-CM | POA: Diagnosis not present

## 2021-10-27 DIAGNOSIS — M25511 Pain in right shoulder: Secondary | ICD-10-CM

## 2021-10-27 DIAGNOSIS — M6281 Muscle weakness (generalized): Secondary | ICD-10-CM

## 2021-10-27 DIAGNOSIS — M25551 Pain in right hip: Secondary | ICD-10-CM

## 2021-10-27 DIAGNOSIS — M25552 Pain in left hip: Secondary | ICD-10-CM | POA: Diagnosis not present

## 2021-10-27 NOTE — Therapy (Signed)
[place]. [place], [place], [address] Phone: [phone]   Fax:  [phone]  Physical Therapy Treatment  Patient Details  Name: Deborah Arroyo MRN: [id number] Date of Birth: 18-Jan-1951 Referring Provider (PT): Karlton Lemon   Encounter Date: 10/27/2021   PT End of Session - 10/27/21 1322     Visit Number 4    Number of Visits 24    Date for PT Re-Evaluation 12/30/21    PT Start Time 1315    PT Stop Time 1356    PT Time Calculation (min) 41 min    Activity Tolerance Patient tolerated treatment well;Patient limited by pain    Behavior During Therapy [facility] for tasks assessed/performed             Past Medical History:  Diagnosis Date   Benign neoplasm of descending colon    Benign neoplasm of transverse colon    Bowel obstruction ([place])    Cancer ([place]) 03/01/2018   rectal cancer=had chemo pills and radation    Cataract    Cervix cancer ([place]) 1979   Depression    Dyspnea    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   Hypoglycemia    Hyponatremia    syncope with this and admitted twice due to this issue    Osteopenia    Osteoporosis    Rectal bleeding    Rectal cancer s/p LAR/diverting loop ileostomy 07/12/2018 03/06/2018   Smoker     Past Surgical History:  Procedure Laterality Date   ABDOMINAL HYSTERECTOMY     APPENDECTOMY     BIOPSY  07/04/2018   Procedure: BIOPSY;  Surgeon: Jerene Bears, MD;  Location: [facility];  Service: Gastroenterology;;   CHOLECYSTECTOMY     COLONOSCOPY  03/01/2018   Dr.Pyrtle   COLONOSCOPY WITH PROPOFOL N/A 07/04/2018   Procedure: COLONOSCOPY WITH PROPOFOL;  Surgeon: Jerene Bears, MD;  [facility]: [facility];  Service: Gastroenterology;  Laterality: N/A;   CYSTOSCOPY WITH STENT PLACEMENT Bilateral 07/12/2018   Procedure: CYSTOSCOPY WITH STENT PLACEMENT;  Surgeon: Lucas Mallow, MD;  Location: [facility];  Service: Urology;  Laterality: Bilateral;   DIVERTING ILEOSTOMY  N/A 07/12/2018   Procedure: DIVERTING LOOP ILEOSTOMY;  Surgeon: Ileana Roup, MD;  [facility]: [facility];  Service: General;  Laterality: N/A;   EYE SURGERY     FLEXIBLE SIGMOIDOSCOPY N/A 07/12/2018   Procedure: FLEXIBLE SIGMOIDOSCOPY;  Surgeon: Ileana Roup, MD;  [facility];  Service: General;  Laterality: N/A;   FLEXIBLE SIGMOIDOSCOPY N/A 12/04/2018   Procedure: FLEXIBLE SIGMOIDOSCOPY;  Surgeon: Ileana Roup, MD;  [facility];  Service: General;  Laterality: N/A;   ILEO LOOP COLOSTOMY CLOSURE N/A 04/20/2019   Procedure: OPEN TAKEDOWN OF ILEOSTOMY, primary repair of parastomal hernia;  Surgeon: Ileana Roup, MD;  [facility];  Service: General;  Laterality: N/A;   LAPAROSCOPIC LOW ANTERIOR RESECTION N/A 07/12/2018   Procedure: LAPAROSCOPIC LOW ANTERIOR RESECTION WITH COLOPROSTOSTOMY, ERAS PATHWAY;  Surgeon: Ileana Roup, MD;  [facility];  Service: General;  Laterality: N/A;   POLYPECTOMY  07/04/2018   Procedure: POLYPECTOMY;  Surgeon: Jerene Bears, MD;  [facility];  Service: Gastroenterology;;   SHOULDER SURGERY Left    Rotary Cuff Tear   TUBAL LIGATION      There were no vitals filed for this visit.   Subjective Assessment - 10/27/21 1318     Subjective Patient reports improvement her shoulder and R hip.  L hip still has some pain down into her leg.    Limitations Sitting;Lifting;Standing;Walking;Writing;House hold activities    How long can you sit comfortably? Cannot sit comfortably.    How long can you stand comfortably? standing does not bother her too much.    How long can you walk comfortably? B hip pain (L> R) limits distance.    Diagnostic tests Michel Bickers, empty can test (+), but pain was in neck  X ray1/22 neck, min spondylosis, B hips 2/22 no acute findings    Patient Stated Goals Decrease pain in neck and hips to allow her to go back to her normal routine.    Currently in Pain? Yes    Pain Score 5      Pain Location Hip    Pain Orientation Left;Lateral;Distal    Pain Descriptors / Indicators Aching    Pain Type Chronic pain    Pain Radiating Towards lateral leg to ankle    Pain Onset 1 to 4 weeks ago    Pain Frequency Intermittent    Multiple Pain Sites No    Pain Onset More than a month ago                               [facility] Adult PT Treatment/Exercise - 10/27/21 0001       Neck Exercises: Machines for Strengthening   UBE (Upper Arm Bike) L1.5 x 3 min for and back    Other Machines for Strengthening Rows, 10#. 15 reps    Other Machines for Strengthening lat Pull down 10#, 15 reps      Neck Exercises: Stretches   Other Neck Stretches cervical/UT stretch iwth head rolls, slowly R/L in sit.    Other Neck Stretches pect stretch in doorway, 2 x 15 seconds      Knee/Hip Exercises: Standing   Hip Flexion Both;1 set;10 reps    Hip Flexion Limitations Yellow Tband    Hip Abduction Both;1 set;10 reps;Stengthening    Abduction Limitations Yellow Tband    Hip Extension Stengthening;Both;10 reps    Extension Limitations Yellow Tband      Manual Therapy   Manual Therapy Soft tissue mobilization    Soft tissue mobilization Upper R trap, rhomboids,cervical para spinales                     PT Education - 10/27/21 1355     Education Details Patient reminded to work to appropriate level of resistane and fatigue. She tends to push too hard with risk of injury or increased pain due to substitution. She required mod VC throughout session to control her effort and form.    Person(s) Educated Patient    Methods Explanation;Demonstration    Comprehension Returned demonstration;Verbalized understanding;Tactile cues required;Verbal cues required;Need further instruction              PT Short Term Goals - 10/27/21 1357       PT SHORT TERM GOAL #1   Title I with basic HEP    Baseline Feedback provided to modify performance.    Time 2    Period Weeks     Status On-going    Target Date 11/04/21               PT Long Term Goals - 10/07/21 1620       PT LONG TERM GOAL #1   Title I with final HEP  Time 12    Period Weeks    Status New    Target Date 12/30/21      PT LONG TERM GOAL #2   Title Patient will be able to move neck to at least 80% of normal in all planes with <3/10 pain.    Time 12    Period Weeks    Status New    Target Date 12/30/21      PT LONG TERM GOAL #3   Title Patient will be able to move R shoulder to 80% of normal ROM in all planes, lift at least 2# in cabinet taps to demosntrate functional use, with < 3/10 pain.    Time 12    Period Weeks    Status New    Target Date 12/30/21      PT LONG TERM GOAL #4   Title Patient will perform at least 30 minutes of standingactivities wiht B hip pain < 3/10    Time 12    Period Weeks    Status New    Target Date 12/30/21      PT LONG TERM GOAL #5   Title Increase B LE strength to at least 4/5 throughout.    Time 12    Period Weeks    Status New    Target Date 12/30/21                   Plan - 10/27/21 1323     Clinical Impression Statement Patient arrives with reports of overall improvement, but continues to have intermittant pain, primarily in L lateral hip. she also notes new onset of B elbow. she tends to [facility] each exercise. Requires mod VC and occasional TC for form. Added standing 3 way hip reach, but did not add to HEP yet as she had difficulty performing without Mod VC.    Personal Factors and Comorbidities Time since onset of injury/illness/exacerbation    Examination-Activity Limitations Bathing;Locomotion Level;Bed Mobility;Reach Overhead;Bend;Caring for Others;Sit;Sleep;Carry;Squat;Stairs;Dressing;Hygiene/Grooming;Stand;Lift    Examination-Participation Restrictions Cleaning;Meal Prep;Driving;Interpersonal Relationship;Shop;Laundry;Yard Work    Librarian, academic Low    Rehab Potential Good    PT Frequency 2x / week    PT Duration 12 weeks    PT Treatment/Interventions ADLs/Self Care Home Management;Iontophoresis 4mg /ml Dexamethasone;Gait training;Taping;Vasopneumatic Device;Vestibular;Dry needling;Passive range of motion;Patient/family education;Stair training;Functional mobility training;Manual techniques;Therapeutic activities;Therapeutic exercise;Balance training;Neuromuscular re-education;Electrical Stimulation;Ultrasound;Moist Heat;Cryotherapy    PT Next Visit Plan DN, continue with TP release, stretch, strengthening.    PT Home Exercise Plan [id number]    Consulted and Agree with Plan of Care Patient             Patient will benefit from skilled therapeutic intervention in order to improve the following deficits and impairments:  Abnormal gait, Decreased coordination, Decreased range of motion, Difficulty walking, Increased fascial restricitons, Impaired tone, Decreased endurance, Dizziness, Increased muscle spasms, Impaired UE functional use, Pain, Decreased activity tolerance, Decreased balance, Hypomobility, Impaired flexibility, Improper body mechanics, Postural dysfunction, Decreased strength, Decreased mobility  Visit Diagnosis: Cervicalgia  Chronic right shoulder pain  Muscle weakness (generalized)  Difficulty in walking, not elsewhere classified  Greater trochanteric pain syndrome of both lower extremities     Problem List Patient Active Problem List   Diagnosis Date Noted   Housing situation unstable 10/14/2021   Bilateral rales 10/14/2021   Polyarthralgia 09/17/2021   Trapezius muscle spasm 09/17/2021   Major depressive disorder 08/20/2021   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 [place], [place], [address] Phone: [phone]   Fax:  [phone]  Name: Deborah Arroyo MRN: [id number] Date of Birth: May 29, 1951

## 2021-10-29 ENCOUNTER — Ambulatory Visit: Payer: Medicare Other | Admitting: Licensed Clinical Social Worker

## 2021-10-29 ENCOUNTER — Ambulatory Visit: Payer: Medicare Other | Admitting: Physical Therapy

## 2021-10-29 NOTE — Chronic Care Management (AMB) (Signed)
°  Care Management   Social Work Visit Note  10/29/2021 Name: Deborah Arroyo MRN: [id number] DOB: 01-Nov-1951  Deborah Arroyo is a 70 y.o. year old female who sees Farrel Gordon, DO for primary care. The care management team was consulted for assistance with care management and care coordination needs related to [facility] Resources    Patient was given the following information about care management and care coordination services today, agreed to services, and gave verbal consent: 1.care management/care coordination services include personalized support from designated clinical staff supervised by their physician, including individualized plan of care and coordination with other care providers 2. 24/7 contact phone numbers for assistance for urgent and routine care needs. 3. The patient may stop care management/care coordination services at any time by phone call to the office staff.  Engaged with patient by telephone for initial visit in response to provider referral for social work chronic care management and care coordination services.  Assessment: Review of patient history, allergies, and health status during evaluation of patient need for care management/care coordination services.    Interventions:  Patient interviewed and appropriate assessments performed Collaborated with clinical team regarding patient needs  Provided patient with the following information; [employer] [phone]) -Section 8 and [employer] (([phone].) -Partners ending homelessness [phone])  Patient is looking for a 3-bedroom home for no more than $600 a month. SW advised patient it will be difficulty to find a home within that budget for the amount of bedrooms. SW advised patient to apply for housing.   Patient advised she receives transportation through [employer] and [place], but rather not use due to long wait times. Patient advised she gets to all her medical  appointments with no concern.  Patient denied needing food assistance.   Patient currently resides in a home with her 3-sons. All are unemployed, but supportive.   Patient denied being in any pain and denied needing to speak with RNCM.  SDOH (Social Determinants of Health) assessments performed: Yes SDOH Interventions    Flowsheet Row Most Recent Value  SDOH Interventions   Housing Interventions Other (Comment)  [employer],  Partners ending homelessness,  Housing Coillition]         Plan:  patient will work with BSW to address needs related to Housing barriers Education officer, museum will Follow up with patient within 30 days.   Milus Height, [place]  Social Worker [facility] Care Management  [phone]

## 2021-10-29 NOTE — Patient Instructions (Signed)
Visit Information  Instructions: patient will work with SW to address concerns related to Housing   Patient was given the following information about care management and care coordination services today, agreed to services, and gave verbal consent: 1.care management/care coordination services include personalized support from designated clinical staff supervised by their physician, including individualized plan of care and coordination with other care providers 2. 24/7 contact phone numbers for assistance for urgent and routine care needs. 3. The patient may stop care management/care coordination services at any time by phone call to the office staff.  Patient verbalizes understanding of instructions provided today and agrees to view in MyChart.   The care management team will reach out to the patient again over the next 30 days.   Deborah Arroyo, BSW  Social Worker [facility] Care Management  [phone]      

## 2021-11-03 ENCOUNTER — Ambulatory Visit: Payer: Commercial Managed Care - HMO | Admitting: Physical Therapy

## 2021-11-04 ENCOUNTER — Ambulatory Visit (INDEPENDENT_AMBULATORY_CARE_PROVIDER_SITE_OTHER): Payer: Medicare Other | Admitting: Family Medicine

## 2021-11-04 ENCOUNTER — Encounter: Payer: Self-pay | Admitting: Family Medicine

## 2021-11-04 VITALS — BP 114/66 | Ht 61.0 in | Wt 120.0 lb

## 2021-11-04 DIAGNOSIS — M25552 Pain in left hip: Secondary | ICD-10-CM

## 2021-11-04 DIAGNOSIS — M25551 Pain in right hip: Secondary | ICD-10-CM

## 2021-11-04 NOTE — Patient Instructions (Signed)
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 Continue your home exercises/stretches. Take acetaminophen as needed as you have been as needed Heat 15 minutes at a time 3-4 times a day. Topical biofreeze or aspercreme up to 4 times a day as needed topically. We injected the left hip today for bursitis. Follow up with me in 6 weeks for reevaluation.

## 2021-11-04 NOTE — Progress Notes (Signed)
PCP: Farrel Gordon, DO  Subjective:   HPI: Patient is a 71 y.o. female here for bilateral hip pain.  Patient reports her neck pain has been improving with physical therapy and home exercises. Had some problems with her elbows but this was when overdoing her exercises. Main issue currently is left > right hip pain. Pain is lateral, worse lying on her side. Can wake her up. Also bothers when walking, using stairs. No numbness/tingling, back pain.  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 12 tablet 1   albuterol (PROVENTIL) (2.5 MG/3ML) 0.083% nebulizer solution Take 3 mLs (2.5 mg total) by nebulization every 4 (four) hours as needed for wheezing or shortness of breath. 75 mL 2   albuterol (VENTOLIN HFA) 108 (90 Base) MCG/ACT inhaler Inhale 2 puffs into the lungs every 6 (six) hours as needed for wheezing or shortness of breath. 8 g 2   alendronate (FOSAMAX) 70 MG tablet TAKE 1 TABLET BY MOUTH ONCE WEEKLY. TAKEWITH A FULL GLASS OF WATER ON AN EMPTY STOMACH 51 tablet 0   FLUoxetine (PROZAC) 40 MG capsule Take 1 capsule (40 mg total) by mouth daily. 30 capsule 3   revefenacin (YUPELRI) 175 MCG/3ML nebulizer solution Take 3 mLs (175 mcg total) by  nebulization daily. 90 mL 1   No current facility-administered medications on file prior to visit.    Past Surgical History:  Procedure Laterality Date   ABDOMINAL HYSTERECTOMY     APPENDECTOMY     BIOPSY  07/04/2018   Procedure: BIOPSY;  Surgeon: Jerene Bears, MD;  [facility];  Service: Gastroenterology;;   CHOLECYSTECTOMY     COLONOSCOPY  03/01/2018   Dr.Pyrtle   COLONOSCOPY WITH PROPOFOL N/A 07/04/2018   Procedure: COLONOSCOPY WITH PROPOFOL;  Surgeon: Jerene Bears, MD;  [facility];  Service: Gastroenterology;  Laterality: N/A;   CYSTOSCOPY WITH STENT PLACEMENT Bilateral 07/12/2018   Procedure: CYSTOSCOPY WITH STENT PLACEMENT;  Surgeon: Lucas Mallow, MD;  [facility];  Service: Urology;  Laterality: Bilateral;   DIVERTING ILEOSTOMY N/A 07/12/2018   Procedure: DIVERTING LOOP ILEOSTOMY;  Surgeon: Ileana Roup, MD;  [facility];  Service: General;  Laterality: N/A;   EYE SURGERY     FLEXIBLE SIGMOIDOSCOPY N/A 07/12/2018   Procedure: FLEXIBLE SIGMOIDOSCOPY;  Surgeon: Ileana Roup, MD;  [facility];  Service: General;  Laterality: N/A;   FLEXIBLE SIGMOIDOSCOPY N/A 12/04/2018   Procedure: Beryle Quant;  Surgeon: Ileana Roup, MD;  [facility];  Service: General;  Laterality: N/A;   ILEO LOOP COLOSTOMY CLOSURE N/A 04/20/2019  Procedure: OPEN TAKEDOWN OF ILEOSTOMY, primary repair of parastomal hernia;  Surgeon: Ileana Roup, MD;  [facility];  Service: General;  Laterality: N/A;   LAPAROSCOPIC LOW ANTERIOR RESECTION N/A 07/12/2018   Procedure: LAPAROSCOPIC LOW ANTERIOR RESECTION WITH COLOPROSTOSTOMY, ERAS PATHWAY;  Surgeon: Ileana Roup, MD;  [facility];  Service: General;  Laterality: N/A;   POLYPECTOMY  07/04/2018   Procedure: POLYPECTOMY;  Surgeon: Jerene Bears, MD;  [facility];  Service: Gastroenterology;;   SHOULDER SURGERY Left    Rotary Cuff Tear   TUBAL LIGATION       Allergies  Allergen Reactions   Aleve [Naproxen Sodium] Swelling and Other (See Comments)    Eyes and throat swell up    BP 114/66    Ht 5\' 1"  (1.549 m)    Wt 120 lb (54.4 kg)    BMI 22.67 kg/m   [place] Adult Exercise 11/04/2021  Frequency of aerobic exercise (# of days/week) 4  Average time in minutes 20  Frequency of strengthening activities (# of days/week) 2    No flowsheet data found.      Objective:  Physical Exam:  Gen: NAD, comfortable in exam room  Left hip: No deformity. FROM with 5/5 strength except 4-/5 hip abduction. Tenderness to palpation greater trochanter. NVI distally. Negative logroll Negative faber, fadir, and piriformis stretches.  Right hip: No deformity. FROM with 5/5 strength. Mild tenderness to palpation greater trochanter. NVI distally. Negative logroll Negative faber, fadir, and piriformis stretches.  Assessment & Plan:  1. Bilateral hip pain - 2/2 greater trochanteric pain syndrome of hips.  Continue home exercises and stretches.  Tylenol as needed, heat, topical medications.  Injected left trochanteric bursa today.  F/u in 6 weeks.  After informed written consent timeout was performed, patient was lying on right side on exam table.  Area overlying left trochanteric bursa prepped with alcohol swab then injected with 6: lidocaine: depomedrol 80mg .  Patient tolerated procedure well without immediate complications.

## 2021-11-05 ENCOUNTER — Ambulatory Visit: Payer: Commercial Managed Care - HMO | Admitting: Physical Therapy

## 2021-11-10 ENCOUNTER — Encounter: Payer: Medicare Other | Admitting: Internal Medicine

## 2021-11-10 NOTE — Progress Notes (Deleted)
° °  CC: f/u visit  HPI:Ms.AZILE MINARDI is a 71 y.o. female who presents for evaluation of ***. Please see individual problem based A/P for details.  ***  Assessment: ***  Plan: ***  ***  Assessment: ***  Plan: ***  ***  Assessment: ***  Plan: ***   Depression, PHQ-9: Based on the patients  [place] Visit from 10/13/2021 in [place]  PHQ-9 Total Score 9      score we have ***.  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   General: *** HEENT: Conjunctiva nl , antiicteric sclerae, moist mucous membranes, no exudate or erythema Cardiovascular: Normal rate, regular rhythm.  No murmurs, rubs, or gallops Pulmonary : Equal breath sounds, No wheezes, rales, or rhonchi Abdominal: soft, nontender,  bowel sounds present Ext: No edema in lower extremities, no tenderness to palpation of lower extremities.   Assessment & Plan:   See Encounters Tab for problem based charting.  Patient {GC/GE:[id number]::"discussed with","seen with"} Dr. {SEGBT:[id number]::"V. Hoffman","Guilloud","Mullen","Narendra","Raines","Vincent","Williams"}

## 2021-11-11 ENCOUNTER — Other Ambulatory Visit: Payer: Self-pay

## 2021-11-11 ENCOUNTER — Inpatient Hospital Stay: Payer: Medicare Other

## 2021-11-11 ENCOUNTER — Inpatient Hospital Stay: Payer: Medicare Other | Attending: Oncology | Admitting: Oncology

## 2021-11-11 VITALS — BP 144/82 | HR 79 | Temp 97.8°F | Resp 20 | Ht 61.0 in | Wt 121.8 lb

## 2021-11-11 DIAGNOSIS — C20 Malignant neoplasm of rectum: Secondary | ICD-10-CM

## 2021-11-11 DIAGNOSIS — Z809 Family history of malignant neoplasm, unspecified: Secondary | ICD-10-CM | POA: Diagnosis not present

## 2021-11-11 DIAGNOSIS — J449 Chronic obstructive pulmonary disease, unspecified: Secondary | ICD-10-CM | POA: Diagnosis not present

## 2021-11-11 DIAGNOSIS — Z720 Tobacco use: Secondary | ICD-10-CM | POA: Diagnosis not present

## 2021-11-11 LAB — CEA (ACCESS): CEA (CHCC): 4.37 ng/mL (ref 0.00–5.00)

## 2021-11-11 NOTE — Progress Notes (Signed)
[facility] OFFICE PROGRESS NOTE   Diagnosis: Rectal cancer  INTERVAL HISTORY:   Deborah Arroyo returns as scheduled.  She feels well.  She continues to have frequent bowel movements, sometimes 2 or 3 times per day.  Imodium helps.  No other complaint.  She had negative surveillance CTs in August 2022.  Objective:  Vital signs in last 24 hours:  Blood pressure (!) 144/82, pulse 79, temperature 97.8 F (36.6 C), temperature source Oral, resp. rate 20, height 5' 1" (1.549 m), weight 121 lb 12.8 oz (55.2 kg), SpO2 100 %.    Lymphatics: No cervical, supraclavicular, axillary, or inguinal nodes Resp: Scattered end inspiratory rhonchi, no respiratory distress Cardio: Regular rate and rhythm GI: No hepatosplenomegaly, no mass, tender at the right costal margin Vascular: No leg edema     Lab Results:  Lab Results  Component Value Date   WBC 14.3 (H) 06/20/2019   HGB 13.7 06/20/2019   HCT 41.2 06/20/2019   MCV 95.4 06/20/2019   PLT 330 06/20/2019   NEUTROABS 8.1 (H) 04/23/2019    CMP  Lab Results  Component Value Date   NA 135 05/11/2021   K 4.2 05/11/2021   CL 100 05/11/2021   CO2 27 05/11/2021   GLUCOSE 91 05/11/2021   BUN 16 05/11/2021   CREATININE 0.95 05/11/2021   CALCIUM 9.6 05/11/2021   PROT 6.7 06/20/2019   ALBUMIN 3.9 06/20/2019   AST 18 06/20/2019   ALT 11 06/20/2019   ALKPHOS 92 06/20/2019   BILITOT 0.6 06/20/2019   GFRNONAA >60 05/11/2021   GFRAA >60 05/22/2020    Lab Results  Component Value Date   CEA1 4.13 05/11/2021   CEA 4.02 05/11/2021    Lab Results  Component Value Date   INR 0.8 01/24/2019   LABPROT 11.4 01/24/2019    Imaging:  No results found.  Medications: I have reviewed the patient's current medications.   Assessment/Plan: Rectal cancer, obstructing rectal mass on colonoscopy 03/01/2018- biopsy confirmed adenocarcinoma Incomplete colonoscopy secondary to obstructing tumor Staging CTs on 03/03/2018- small  mediastinal/hilar lymph nodes, indeterminate lingula nodule, rectal mass, small perirectal lymph nodes, mass measured at approximately 4.9 cm from the anal verge MRI pelvis 03/11/2018- T3c,N2 Radiation/Xeloda 04/03/2018-05/11/2018 Colonoscopy 07/04/2018- mass at 8 cm from the dentate line, polyps removed from the hepatic flexure and descending colon-tubular adenomas Low anterior resection/ileostomy 07/12/2018, T3N0 moderately differentiated adenocarcinoma, margins negative, 11- lymph nodes, no lymphovascular or perineural invasion, MSI-stable, no loss of mismatch repair protein expression Cycle 1 adjuvant Xeloda 08/07/2018 Cycle 2 adjuvant Xeloda 08/28/2018 Cycle 3 adjuvant Xeloda 09/18/2018 Cycle 4 adjuvant Xeloda 10/09/2018 (Xeloda held 10/13/2018 pm dose, 10/14/2018 and 10/15/2018; resumed 10/16/2018 at a reduced dose of 1000 mg every morning and 500 mg every afternoon for the remainder of the cycle) Cycle 5 adjuvant Xeloda 10/30/2018 CTs 01/04/2019- low anterior resection with a right lower quadrant ileostomy, no evidence of metastatic disease Takedown of diverting loop ileostomy and closure of parastomal hernia 04/20/2019 Surveillance colonoscopy 07/19/2019-2 mm polyp in the ascending colon (sessile serrated polyp without cytologic dysplasia), next colonoscopy at a 3-year interval CTs 06/04/2020- no evidence of metastatic disease, centrilobular emphysema CTs 06/12/2021-no evidence of recurrent disease Tobacco use COPD Family history of multiple cancers       Disposition: Ms. Pair is in clinical remission from rectal cancer.  We will follow-up on the CEA from today.  She will return for an office visit and CEA in 6 months.  She will be due for  a surveillance colonoscopy later this year.  Betsy Coder, MD  11/11/2021  12:00 PM

## 2021-11-12 ENCOUNTER — Telehealth: Payer: Self-pay

## 2021-11-12 NOTE — Telephone Encounter (Signed)
-----   Message from Ladell Pier, MD sent at 11/11/2021  6:14 PM EST ----- Please call patient, the CEA is normal, follow-up as scheduled

## 2021-11-12 NOTE — Telephone Encounter (Signed)
V/M message left with results. Pt informed to return call if questions or concerns.

## 2021-11-23 ENCOUNTER — Telehealth: Payer: Self-pay

## 2021-11-23 DIAGNOSIS — F329 Major depressive disorder, single episode, unspecified: Secondary | ICD-10-CM

## 2021-11-23 NOTE — Assessment & Plan Note (Signed)
Pt called [facility] requesting prozac dose decrease to 20mg  from 40mg  due to having abnormal dreams and talking in her sleep. These are not typical adverse medication effects so I have requested a telehealth visit be arranged for further discussion. Since similar symptoms occurred with lexapro, she may benefit from referral to psychiatry for alternative medication management.

## 2021-11-23 NOTE — Telephone Encounter (Signed)
Requesting to speak with a nurse about FLUoxetine (PROZAC) 40 MG capsule. Please call pt back.

## 2021-11-23 NOTE — Telephone Encounter (Signed)
Can we set her up a telehealth visit to discuss this further?

## 2021-11-23 NOTE — Telephone Encounter (Signed)
Returned call to patient. States she has started "talking in my sleep again and having "weird dreams." States she thinks it is 2/2 increasing Prozac from 20 mg to 40 mg. States this happened before when she was taking Lexapro. She is requesting to go back to Prozac 20 mg.

## 2021-11-24 NOTE — Telephone Encounter (Signed)
Thank you :)

## 2021-11-25 ENCOUNTER — Ambulatory Visit (INDEPENDENT_AMBULATORY_CARE_PROVIDER_SITE_OTHER): Payer: Medicare Other | Admitting: Internal Medicine

## 2021-11-25 DIAGNOSIS — F329 Major depressive disorder, single episode, unspecified: Secondary | ICD-10-CM

## 2021-11-25 NOTE — Progress Notes (Signed)
°  [facility] Internal Medicine Residency Telephone Encounter Continuity Care Appointment  HPI:  This telephone encounter was created for [name] on 11/25/2021 for discussion regarding adverse effects from prozac. Please refer to problem based charting for HPI details, assessment and plan.    Assessment / Plan / Recommendations:  Problem List Items Addressed This Visit       Other   Major depressive disorder    This telehealth encounter was to discuss potential adverse effects after increasing prozac dose in November from 20>40mg . She developed abnormal dreams and was noted to be talking in her sleep about 1 week after the dose increase. She notes similar effects occurred with lexapro in the past. Denies any other adverse medication effects.  She notes that she felt "ok" prior to the dose increase in November and has not noticed any improvement in depressive symptoms since the increase.  Assessment: I am not familiar with the above symptoms being a known adverse effect from prozac. Given the lack of improvement in symptom control and the development of the above symptoms, she is agreeable with titrating back down to 20mg . Since this same thing has happened with 2 separate drugs, I do not think it would be beneficial to transition to a 3rd one. I offered referral to psychiatry today which she declined. She knows to let us know if she changes her mind. No SI during discussion today. Plan: Decrease prozac to 20mg  daily Follow up in 4-6 weeks for med recheck via telehealth       As always, pt is advised that if symptoms worsen or new symptoms arise, they should go to an urgent care facility or to to ER for further evaluation.   Consent and Medical Decision Making:  Patient discussed with Dr.  Jimmye Norman This is a telephone encounter between Deborah Arroyo and Deborah Arroyo on 11/25/2021 for abnormal dreams. The visit was conducted with the patient located at home and [employer] at  [facility]. The patient's identity was confirmed using their DOB and current address. The patient has consented to being evaluated through a telephone encounter and understands the associated risks (an examination cannot be done and the patient may need to come in for an appointment) / benefits (allows the patient to remain at home, decreasing exposure to coronavirus). I personally spent 20 minutes on medical discussion.     Mitzi Hansen, MD Internal Medicine Resident PGY-3 [facility] 11/25/2021 2:13 PM

## 2021-11-25 NOTE — Assessment & Plan Note (Addendum)
This telehealth encounter was to discuss potential adverse effects after increasing prozac dose in November from 20>40mg . She developed abnormal dreams and was noted to be talking in her sleep about 1 week after the dose increase. She notes similar effects occurred with lexapro in the past. Denies any other adverse medication effects.  She notes that she felt "ok" prior to the dose increase in November and has not noticed any improvement in depressive symptoms since the increase.  Assessment: I am not familiar with the above symptoms being a known adverse effect from prozac. Given the lack of improvement in symptom control and the development of the above symptoms, she is agreeable with titrating back down to 20mg . Since this same thing has happened with 2 separate drugs, I do not think it would be beneficial to transition to a 3rd one. I offered referral to psychiatry today which she declined. She knows to let us know if she changes her mind. No SI during discussion today. Plan:  Decrease prozac to 20mg  daily  Follow up in 4-6 weeks for med recheck via telehealth

## 2021-11-25 NOTE — Progress Notes (Signed)
Internal Medicine Clinic Attending  Case discussed with Dr. Christian  At the time of the visit.  We reviewed the resident's history and exam and pertinent patient test results.  I agree with the assessment, diagnosis, and plan of care documented in the resident's note.  

## 2021-12-03 ENCOUNTER — Ambulatory Visit: Payer: Medicare Other | Admitting: Licensed Clinical Social Worker

## 2021-12-03 NOTE — Patient Instructions (Signed)
Visit Information  Instructions: patient will work with SW to address concerns related to housing.  Patient was given the following information about care management and care coordination services today, agreed to services, and gave verbal consent: 1.care management/care coordination services include personalized support from designated clinical staff supervised by their physician, including individualized plan of care and coordination with other care providers 2. 24/7 contact phone numbers for assistance for urgent and routine care needs. 3. The patient may stop care management/care coordination services at any time by phone call to the office staff.  Patient verbalizes understanding of instructions and care plan provided today and agrees to view in [place]. Active MyChart status confirmed with patient.    The care management team will reach out to the patient again over the next 30 days.   Milus Height, [place]  Social Worker [facility] Care Management  [phone]

## 2021-12-03 NOTE — Chronic Care Management (AMB) (Signed)
°  Care Management   Social Work Visit Note  12/03/2021 Name: Deborah Arroyo MRN: [id number] DOB: Nov 07, 1950  Deborah Arroyo is a 71 y.o. year old female who sees Farrel Gordon, DO for primary care. The care management team was consulted for assistance with care management and care coordination needs related to  Housing     Patient was given the following information about care management and care coordination services today, agreed to services, and gave verbal consent: 1.care management/care coordination services include personalized support from designated clinical staff supervised by their physician, including individualized plan of care and coordination with other care providers 2. 24/7 contact phone numbers for assistance for urgent and routine care needs. 3. The patient may stop care management/care coordination services at any time by phone call to the office staff.  Engaged with patient by telephone for follow up visit in response to provider referral for social work chronic care management and care coordination services.  Assessment: Review of patient history, allergies, and health status during evaluation of patient need for care management/care coordination services.    Interventions:  Patient interviewed and appropriate assessments performed Collaborated with clinical team regarding patient needs  Patient is seeking new housing. Patient has applied for housing.  SDOH (Social Determinants of Health) assessments performed: Yes     Plan:  patient will work with BSW to address needs related to Housing barriers Education officer, museum will follow up with patient within 30-days.    Milus Height, [place]  Social Worker [facility] Care Management  [phone]

## 2021-12-16 ENCOUNTER — Ambulatory Visit: Payer: Medicare Other | Admitting: Family Medicine

## 2021-12-23 ENCOUNTER — Encounter: Payer: Self-pay | Admitting: Family Medicine

## 2021-12-23 ENCOUNTER — Ambulatory Visit (INDEPENDENT_AMBULATORY_CARE_PROVIDER_SITE_OTHER): Payer: Medicare Other | Admitting: Family Medicine

## 2021-12-23 VITALS — BP 120/82 | Ht 61.0 in | Wt 120.0 lb

## 2021-12-23 DIAGNOSIS — M25551 Pain in right hip: Secondary | ICD-10-CM

## 2021-12-23 DIAGNOSIS — M25552 Pain in left hip: Secondary | ICD-10-CM | POA: Diagnosis not present

## 2021-12-23 DIAGNOSIS — M542 Cervicalgia: Secondary | ICD-10-CM | POA: Diagnosis not present

## 2021-12-23 NOTE — Progress Notes (Signed)
PCP: Farrel Gordon, DO  Subjective:   HPI: Patient is a 71 y.o. female here for bilateral hip pain.  1/4: Patient reports her neck pain has been improving with physical therapy and home exercises. Had some problems with her elbows but this was when overdoing her exercises. Main issue currently is left > right hip pain. Pain is lateral, worse lying on her side. Can wake her up. Also bothers when walking, using stairs. No numbness/tingling, back pain.  2/22: Patient reports she is doing well and hips are improving. Able to lie down on side now following injection. Doing home exercises and stretches. Not waking her up. Still with pain in low back up to neck. Using salon pas patches. No radiation into extremities.  Past Medical History:  Diagnosis Date   Benign neoplasm of descending colon    Benign neoplasm of transverse colon    Bowel obstruction ([place])    Cancer (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 12 tablet 1   albuterol (PROVENTIL) (2.5 MG/3ML) 0.083% nebulizer solution Take 3 mLs (2.5 mg total) by nebulization every 4 (four) hours as needed for wheezing or shortness of breath. 75 mL 2   albuterol (VENTOLIN HFA) 108 (90 Base) MCG/ACT inhaler Inhale 2 puffs into the lungs every 6 (six) hours as needed for wheezing or shortness of breath. 8 g 2   alendronate (FOSAMAX) 70 MG tablet  TAKE 1 TABLET BY MOUTH ONCE WEEKLY. TAKEWITH A FULL GLASS OF WATER ON AN EMPTY STOMACH 51 tablet 0   FLUoxetine (PROZAC) 40 MG capsule Take 1 capsule (40 mg total) by mouth daily. 30 capsule 3   revefenacin (YUPELRI) 175 MCG/3ML nebulizer solution Take 3 mLs (175 mcg total) by nebulization daily. 90 mL 1   No current facility-administered medications on file prior to visit.    Past Surgical History:  Procedure Laterality Date   ABDOMINAL HYSTERECTOMY     APPENDECTOMY     BIOPSY  07/04/2018   Procedure: BIOPSY;  Surgeon: Jerene Bears, MD;  Location: [facility];  Service: Gastroenterology;;   CHOLECYSTECTOMY     COLONOSCOPY  03/01/2018   Dr.Pyrtle   COLONOSCOPY WITH PROPOFOL N/A 07/04/2018   Procedure: COLONOSCOPY WITH PROPOFOL;  Surgeon: Jerene Bears, MD;  Location: [facility];  Service: Gastroenterology;  Laterality: N/A;   CYSTOSCOPY WITH STENT PLACEMENT Bilateral 07/12/2018   Procedure: CYSTOSCOPY WITH STENT PLACEMENT;  Surgeon: Lucas Mallow, MD;  Location: [facility];  Service: Urology;  Laterality: Bilateral;   DIVERTING ILEOSTOMY N/A 07/12/2018   Procedure: DIVERTING LOOP ILEOSTOMY;  Surgeon: Ileana Roup, MD;  Location: [facility];  Service: General;  Laterality: N/A;   EYE SURGERY     FLEXIBLE SIGMOIDOSCOPY N/A 07/12/2018   Procedure: FLEXIBLE SIGMOIDOSCOPY;  Surgeon: Nadeen Landau  M, MD;  Location: [facility];  Service: General;  Laterality: N/A;   FLEXIBLE SIGMOIDOSCOPY N/A 12/04/2018   Procedure: FLEXIBLE SIGMOIDOSCOPY;  Surgeon: Ileana Roup, MD;  Location: [facility];  Service: General;  Laterality: N/A;   ILEO LOOP COLOSTOMY CLOSURE N/A 04/20/2019   Procedure: OPEN TAKEDOWN OF ILEOSTOMY, primary repair of parastomal hernia;  Surgeon: Ileana Roup, MD;  Location: [facility];  Service: General;  Laterality: N/A;   LAPAROSCOPIC LOW ANTERIOR RESECTION N/A 07/12/2018   Procedure: LAPAROSCOPIC LOW ANTERIOR RESECTION WITH COLOPROSTOSTOMY, ERAS PATHWAY;  Surgeon:  Ileana Roup, MD;  Location: [facility];  Service: General;  Laterality: N/A;   POLYPECTOMY  07/04/2018   Procedure: POLYPECTOMY;  Surgeon: Jerene Bears, MD;  Location: [facility];  Service: Gastroenterology;;   SHOULDER SURGERY Left    Rotary Cuff Tear   TUBAL LIGATION      Allergies  Allergen Reactions   Aleve [Naproxen Sodium] Swelling and Other (See Comments)    Eyes and throat swell up    BP 120/82    Ht 5\' 1"  (1.549 m)    Wt 120 lb (54.4 kg)    BMI 22.67 kg/m   [place] Adult Exercise 11/04/2021 12/23/2021  Frequency of aerobic exercise (# of days/week) 4 4  Average time in minutes 20 20  Frequency of strengthening activities (# of days/week) 2 2    No flowsheet data found.      Objective:  Physical Exam:  Gen: NAD, comfortable in exam room  Left hip: No deformity. FROM with 5/5 strength except 5-/5 hip abduction but improved. Minimal tenderness to palpation over greater trochanter. NVI distally. Negative logroll Negative faber, fadir, and piriformis stretches.  Back/Neck: No gross deformity, scoliosis. TTP paraspinal cervical, lumbar regions and trapezius more on right.  No midline or bony TTP. FROM. Strength UE and LEs 5/5 all muscle groups except hip abduction noted above.   Negative SLRs.  Assessment & Plan:  1. Bilateral hip pain - 2/2 greater trochanteric pain syndrome of hips.  Doing well following injection on left.  Continue home exercises and stretches.  F/u prn.  2. Neck/back pain - 2/2 posture muscle overuse.  Home exercises for neck and low back - has done PT in past - offered can consider this again.  Salon pas patches.  F/u prn.

## 2021-12-23 NOTE — Patient Instructions (Signed)
Continue your home exercises especially that hip side raise exercise. Let me know if you want to do formal physical therapy though I know it's very far away from home - hopefully the home exercises will be enough along with your salon pas patches to keep you out of there. Follow up with me as needed.

## 2022-02-04 ENCOUNTER — Ambulatory Visit: Payer: Medicare Other | Admitting: Licensed Clinical Social Worker

## 2022-02-04 NOTE — Chronic Care Management (AMB) (Signed)
?  Care Management  ? ?Social Work Visit Note ? ?02/04/2022 ?Name: Deborah Arroyo MRN: [id number] DOB: [birth year] ? ?Deborah Arroyo is a 70 y.o. year old female who sees Farrel Gordon, DO for primary care. The care management team was consulted for assistance with care management and care coordination needs related to [facility] Resources   ? ?Patient was given the following information about care management and care coordination services today, agreed to services, and gave verbal consent: 1.care management/care coordination services include personalized support from designated clinical staff supervised by their physician, including individualized plan of care and coordination with other care providers 2. 24/7 contact phone numbers for assistance for urgent and routine care needs. 3. The patient may stop care management/care coordination services at any time by phone call to the office staff. ? ?Engaged with patient by telephone for follow up visit in response to provider referral for social work chronic care management and care coordination services. ? ?Assessment: Review of patient history, allergies, and health status during evaluation of patient need for care management/care coordination services.   ? ?Interventions:  ?Patient interviewed and appropriate assessments performed ?Collaborated with clinical team regarding patient needs  ?Patient has secured housing, but wants to relocate. Patient has not located a new residence within her set budget of $58. Patient will continue efforts and notified SW when she relocates.  ?SDOH completed and no other barriers determined and no other resources needed at this time.  ? ?SDOH (Social Determinants of Health) assessments performed: Yes ?   ? ?Plan:  ?NO further follow up needed.  ? ?Milus Height, BSW  ?Social Worker ?[facility] Care Management  ?[phone] ?  ? ? ? ? ? ? ? ? ? ? ? ? ? ? ?

## 2022-02-04 NOTE — Patient Instructions (Signed)
Visit Information ? ?Instructions:  ? ?Patient was given the following information about care management and care coordination services today, agreed to services, and gave verbal consent: 1.care management/care coordination services include personalized support from designated clinical staff supervised by their physician, including individualized plan of care and coordination with other care providers 2. 24/7 contact phone numbers for assistance for urgent and routine care needs. 3. The patient may stop care management/care coordination services at any time by phone call to the office staff. ? ?Patient verbalizes understanding of instructions and care plan provided today and agrees to view in [place]. Active MyChart status confirmed with patient.   ? ?No further follow up needed.  ? ?Milus Height, BSW  ?Social Worker ?[facility] Care Management  [phone] ?  ? ?  ?

## 2022-02-07 ENCOUNTER — Encounter (HOSPITAL_COMMUNITY): Payer: Self-pay | Admitting: Emergency Medicine

## 2022-02-07 ENCOUNTER — Emergency Department (HOSPITAL_COMMUNITY): Payer: Medicare Other

## 2022-02-07 ENCOUNTER — Emergency Department (HOSPITAL_COMMUNITY)
Admission: EM | Admit: 2022-02-07 | Discharge: 2022-02-07 | Discharge status: Against Medical Advice | Payer: Medicare Other | Attending: Emergency Medicine | Admitting: Emergency Medicine

## 2022-02-07 ENCOUNTER — Ambulatory Visit
Admission: EM | Admit: 2022-02-07 | Discharge: 2022-02-07 | Disposition: A | Discharge status: Other Acute Inpatient Hospital | Payer: Medicare Other | Attending: Student | Admitting: Student

## 2022-02-07 ENCOUNTER — Encounter: Payer: Self-pay | Admitting: Emergency Medicine

## 2022-02-07 ENCOUNTER — Other Ambulatory Visit: Payer: Self-pay

## 2022-02-07 DIAGNOSIS — Z8541 Personal history of malignant neoplasm of cervix uteri: Secondary | ICD-10-CM | POA: Diagnosis not present

## 2022-02-07 DIAGNOSIS — I6522 Occlusion and stenosis of left carotid artery: Secondary | ICD-10-CM | POA: Diagnosis not present

## 2022-02-07 DIAGNOSIS — J9811 Atelectasis: Secondary | ICD-10-CM | POA: Diagnosis not present

## 2022-02-07 DIAGNOSIS — Z20822 Contact with and (suspected) exposure to covid-19: Secondary | ICD-10-CM | POA: Insufficient documentation

## 2022-02-07 DIAGNOSIS — R519 Headache, unspecified: Secondary | ICD-10-CM

## 2022-02-07 DIAGNOSIS — N390 Urinary tract infection, site not specified: Secondary | ICD-10-CM | POA: Diagnosis not present

## 2022-02-07 DIAGNOSIS — M542 Cervicalgia: Secondary | ICD-10-CM | POA: Diagnosis not present

## 2022-02-07 DIAGNOSIS — Z85038 Personal history of other malignant neoplasm of large intestine: Secondary | ICD-10-CM | POA: Insufficient documentation

## 2022-02-07 DIAGNOSIS — R0689 Other abnormalities of breathing: Secondary | ICD-10-CM | POA: Diagnosis not present

## 2022-02-07 DIAGNOSIS — R0902 Hypoxemia: Secondary | ICD-10-CM | POA: Diagnosis not present

## 2022-02-07 DIAGNOSIS — B9689 Other specified bacterial agents as the cause of diseases classified elsewhere: Secondary | ICD-10-CM | POA: Diagnosis not present

## 2022-02-07 DIAGNOSIS — Z85048 Personal history of other malignant neoplasm of rectum, rectosigmoid junction, and anus: Secondary | ICD-10-CM | POA: Insufficient documentation

## 2022-02-07 LAB — CBC WITH DIFFERENTIAL/PLATELET
Abs Immature Granulocytes: 0.08 10*3/uL — ABNORMAL HIGH (ref 0.00–0.07)
Basophils Absolute: 0.1 10*3/uL (ref 0.0–0.1)
Basophils Relative: 1 %
Eosinophils Absolute: 0.1 10*3/uL (ref 0.0–0.5)
Eosinophils Relative: 0 %
HCT: 40.5 % (ref 36.0–46.0)
Hemoglobin: 13.4 g/dL (ref 12.0–15.0)
Immature Granulocytes: 1 %
Lymphocytes Relative: 8 %
Lymphs Abs: 1.3 10*3/uL (ref 0.7–4.0)
MCH: 31.9 pg (ref 26.0–34.0)
MCHC: 33.1 g/dL (ref 30.0–36.0)
MCV: 96.4 fL (ref 80.0–100.0)
Monocytes Absolute: 1.1 10*3/uL — ABNORMAL HIGH (ref 0.1–1.0)
Monocytes Relative: 6 %
Neutro Abs: 14.8 10*3/uL — ABNORMAL HIGH (ref 1.7–7.7)
Neutrophils Relative %: 84 %
Platelets: 431 10*3/uL — ABNORMAL HIGH (ref 150–400)
RBC: 4.2 MIL/uL (ref 3.87–5.11)
RDW: 14.1 % (ref 11.5–15.5)
WBC: 17.4 10*3/uL — ABNORMAL HIGH (ref 4.0–10.5)
nRBC: 0 % (ref 0.0–0.2)

## 2022-02-07 LAB — URINALYSIS, ROUTINE W REFLEX MICROSCOPIC
Bilirubin Urine: NEGATIVE
Glucose, UA: NEGATIVE mg/dL
Ketones, ur: NEGATIVE mg/dL
Nitrite: POSITIVE — AB
Protein, ur: NEGATIVE mg/dL
RBC / HPF: >50 RBC/hpf — ABNORMAL HIGH (ref 0–5)
Specific Gravity, Urine: 1.017 (ref 1.005–1.030)
pH: 5 (ref 5.0–8.0)

## 2022-02-07 LAB — RESP PANEL BY RT-PCR (FLU A&B, COVID) ARPGX2
Influenza A by PCR: NEGATIVE
Influenza B by PCR: NEGATIVE
SARS Coronavirus 2 by RT PCR: NEGATIVE

## 2022-02-07 LAB — COMPREHENSIVE METABOLIC PANEL
ALT: 17 U/L (ref 0–44)
AST: 22 U/L (ref 15–41)
Albumin: 3.8 g/dL (ref 3.5–5.0)
Alkaline Phosphatase: 80 U/L (ref 38–126)
Anion gap: 10 (ref 5–15)
BUN: 10 mg/dL (ref 8–23)
CO2: 25 mmol/L (ref 22–32)
Calcium: 9.4 mg/dL (ref 8.9–10.3)
Chloride: 96 mmol/L — ABNORMAL LOW (ref 98–111)
Creatinine, Ser: 0.82 mg/dL (ref 0.44–1.00)
GFR, Estimated: >60 mL/min (ref >60)
Glucose, Bld: 113 mg/dL — ABNORMAL HIGH (ref 70–99)
Potassium: 3.8 mmol/L (ref 3.5–5.1)
Sodium: 131 mmol/L — ABNORMAL LOW (ref 135–145)
Total Bilirubin: 0.5 mg/dL (ref 0.3–1.2)
Total Protein: 7.6 g/dL (ref 6.5–8.1)

## 2022-02-07 LAB — LACTIC ACID, PLASMA: Lactic Acid, Venous: 1.2 mmol/L (ref 0.5–1.9)

## 2022-02-07 MED ORDER — METHOCARBAMOL 1000 MG/10ML IJ SOLN
1000.0000 mg | Freq: Once | INTRAMUSCULAR | Status: DC
Start: 2022-02-07 — End: 2022-02-07
  Filled 2022-02-07: qty 10

## 2022-02-07 MED ORDER — LACTATED RINGERS IV BOLUS
1000.0000 mL | Freq: Once | INTRAVENOUS | Status: AC
  Administered 2022-02-07: 1000 mL via INTRAVENOUS

## 2022-02-07 MED ORDER — IOHEXOL 350 MG/ML SOLN
47.0000 mL | Freq: Once | INTRAVENOUS | Status: AC | PRN
Start: 2022-02-07 — End: 2022-02-07
  Administered 2022-02-07: 47 mL via INTRAVENOUS

## 2022-02-07 MED ORDER — PROCHLORPERAZINE EDISYLATE 10 MG/2ML IJ SOLN
10.0000 mg | Freq: Once | INTRAMUSCULAR | Status: AC
  Administered 2022-02-07: 10 mg via INTRAVENOUS
  Filled 2022-02-07: qty 2

## 2022-02-07 MED ORDER — SODIUM CHLORIDE 0.9 % IV SOLN
1.0000 g | Freq: Once | INTRAVENOUS | Status: AC
  Administered 2022-02-07: 1 g via INTRAVENOUS
  Filled 2022-02-07: qty 10

## 2022-02-07 MED ORDER — METHOCARBAMOL 1000 MG/10ML IJ SOLN
1000.0000 mg | Freq: Once | INTRAVENOUS | Status: AC
  Administered 2022-02-07: 1000 mg via INTRAVENOUS
  Filled 2022-02-07: qty 10

## 2022-02-07 MED ORDER — IOHEXOL 350 MG/ML SOLN
75.0000 mL | Freq: Once | INTRAVENOUS | Status: AC | PRN
  Administered 2022-02-07: 75 mL via INTRAVENOUS

## 2022-02-07 MED ORDER — CEPHALEXIN 500 MG PO CAPS: 500.0000 mg | ORAL_CAPSULE | Freq: Two times a day (BID) | ORAL | 0 refills | Status: AC

## 2022-02-07 MED ORDER — DIPHENHYDRAMINE HCL 50 MG/ML IJ SOLN
25.0000 mg | Freq: Once | INTRAMUSCULAR | Status: AC
  Administered 2022-02-07: 25 mg via INTRAVENOUS
  Filled 2022-02-07: qty 1

## 2022-02-07 MED ORDER — ONDANSETRON 4 MG PO TBDP
4.0000 mg | ORAL_TABLET | Freq: Once | ORAL | Status: AC
  Administered 2022-02-07: 4 mg via ORAL

## 2022-02-07 NOTE — Discharge Instructions (Addendum)
Follow-up with your primary care doctor tomorrow for recheck of your vital signs including your oxygen numbers.  I would recommend that you check this at home as well.  If you have any persistent oxygen levels below 88 and/or worsening respiratory symptoms please return for evaluation.  Take antibiotics for possible urinary tract infection.  Next dose is tomorrow morning. ?

## 2022-02-07 NOTE — ED Provider Notes (Signed)
?[facility] ? ? ? ?CSN: [id number] ?Arrival date & time: 02/07/22  1950 ? ? ?  ? ?History   ?Chief Complaint ?Chief Complaint  ?Patient presents with  ? Vomiting  ? Neck Pain  ? ? ?HPI ?Deborah Arroyo is a 71 y.o. female presenting with intractable headache.  History chronic head and neck pain related to arthritis, but states that this is the worst headache she has had in some time.  She took some expired tramadol last night, and following this developed nausea and vomiting that has continued throughout the day.  Describes this as bilious, but denies hematemesis.  States her neck feels very stiff, and throbbing pain that radiates to the right forehead.  Associated with photophobia and transient lightheadedness. Tolerating some fluids but hasn't attempted foods. She is not a diabetic. Denies CP, SOB, weakness. Denies thunderclap headache, vision changes, vision loss. ? ?HPI ? ?Past Medical History:  ?Diagnosis Date  ? Benign neoplasm of descending colon   ? Benign neoplasm of transverse colon   ? Bowel obstruction ([place])   ? Cancer ([place]) 03/01/2018  ? rectal cancer=had chemo pills and radation   ? Cataract   ? Cervix cancer ([place]) 1979  ? Depression   ? Dyspnea   ? 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 Hypoglycemia   ? Hyponatremia   ? syncope with this and admitted twice due to this issue   ? Osteopenia   ? Osteoporosis   ? Rectal bleeding   ? Rectal cancer s/p LAR/diverting loop ileostomy 07/12/2018 03/06/2018  ? Smoker   ? ? ?Patient Active Problem List  ? Diagnosis Date Noted  ? Housing situation unstable 10/14/2021  ? Bilateral rales 10/14/2021  ? Polyarthralgia 09/17/2021  ? Trapezius muscle spasm 09/17/2021  ? Major depressive disorder 08/20/2021  ? Emphysema/COPD ([place]) 08/20/2021  ? GAD (generalized anxiety disorder) 02/18/2020  ? Fecal incontinence with diarrhea 04/22/2019  ? S/P closure of ileostomy 04/20/2019  ? Acute right-sided low back pain without sciatica 08/10/2018  ? Rectal cancer s/p  LAR/diverting loop ileostomy 07/12/2018 03/06/2018  ? Osteoporosis 12/15/2017  ? Rectal mass 08/25/2017  ? Bilateral knee pain 08/25/2017  ? Health care maintenance 09/04/2015  ? Tobacco abuse 09/04/2015  ? ? ?Past Surgical History:  ?Procedure Laterality Date  ? ABDOMINAL HYSTERECTOMY    ? APPENDECTOMY    ? BIOPSY  07/04/2018  ? Procedure: BIOPSY;  Surgeon: Jerene Bears, MD;  Location: [facility];  Service: Gastroenterology;;  ? CHOLECYSTECTOMY    ? COLONOSCOPY  03/01/2018  ? Dr.Pyrtle  ? COLONOSCOPY WITH PROPOFOL N/A 07/04/2018  ? Procedure: COLONOSCOPY WITH PROPOFOL;  Surgeon: Jerene Bears, MD;  Location: [facility];  Service: Gastroenterology;  Laterality: N/A;  ? CYSTOSCOPY WITH STENT PLACEMENT Bilateral 07/12/2018  ? Procedure: CYSTOSCOPY WITH STENT PLACEMENT;  Surgeon: Lucas Mallow, MD;  Location: [facility];  Service: Urology;  Laterality: Bilateral;  ? DIVERTING ILEOSTOMY N/A 07/12/2018  ? Procedure: DIVERTING LOOP ILEOSTOMY;  Surgeon: Ileana Roup, MD;  Location: [facility];  Service: General;  Laterality: N/A;  ? EYE SURGERY    ? FLEXIBLE SIGMOIDOSCOPY N/A 07/12/2018  ? Procedure: FLEXIBLE SIGMOIDOSCOPY;  Surgeon: Ileana Roup, MD;  Location: [facility];  Service: General;  Laterality: N/A;  ? FLEXIBLE SIGMOIDOSCOPY N/A 12/04/2018  ? Procedure: FLEXIBLE SIGMOIDOSCOPY;  Surgeon: Ileana Roup, MD;  Location: [facility];  Service: General;  Laterality: N/A;  ? ILEO LOOP COLOSTOMY CLOSURE N/A 04/20/2019  ? Procedure: OPEN  TAKEDOWN OF ILEOSTOMY, primary repair of parastomal hernia;  Surgeon: Ileana Roup, MD;  Location: [facility];  Service: General;  Laterality: N/A;  ? LAPAROSCOPIC LOW ANTERIOR RESECTION N/A 07/12/2018  ? Procedure: LAPAROSCOPIC LOW ANTERIOR RESECTION WITH COLOPROSTOSTOMY, ERAS PATHWAY;  Surgeon: Ileana Roup, MD;  Location: [facility];  Service: General;  Laterality: N/A;  ? POLYPECTOMY  07/04/2018  ? Procedure: POLYPECTOMY;  Surgeon: Jerene Bears, MD;  Location: [facility];  Service: Gastroenterology;;  ? SHOULDER SURGERY Left   ? Rotary Cuff Tear  ? TUBAL LIGATION    ? ? ?OB History   ? ? Gravida  ?4  ? Para  ?3  ? Term  ?3  ? Preterm  ?   ? AB  ?1  ? Living  ?3  ?  ? ? SAB  ?   ? IAB  ?1  ? Ectopic  ?   ? Multiple  ?   ? Live Births  ?   ?   ?  ?  ? ? ? ?Home Medications   ? ?Prior to Admission medications   ?Medication Sig Start Date End Date Taking? Authorizing Provider  ?acetaminophen (TYLENOL) 325 MG tablet Take 2 tablets (650 mg total) by mouth every 6 (six) hours as needed for mild pain, fever or headache (or Fever >/= 101). 06/21/19   Roxan Hockey, MD  ?albuterol (PROVENTIL) (2.5 MG/3ML) 0.083% nebulizer solution Take 3 mLs (2.5 mg total) by nebulization every 4 (four) hours as needed for wheezing or shortness of breath. 09/16/21 09/16/22  Jose Persia, MD  ?albuterol (VENTOLIN HFA) 108 (90 Base) MCG/ACT inhaler Inhale 2 puffs into the lungs every 6 (six) hours as needed for wheezing or shortness of breath. 09/09/21   Jose Persia, MD  ?alendronate (FOSAMAX) 70 MG tablet TAKE 1 TABLET BY MOUTH ONCE WEEKLY. TAKEWITH A FULL GLASS OF WATER ON AN EMPTY STOMACH 07/14/21   Mitzi Hansen, MD  ?FLUoxetine (PROZAC) 40 MG capsule Take 1 capsule (40 mg total) by mouth daily. 09/16/21   Jose Persia, MD  ?revefenacin (YUPELRI) 175 MCG/3ML nebulizer solution Take 3 mLs (175 mcg total) by nebulization daily. 10/14/21   Jose Persia, MD  ? ? ?Family History ?Family History  ?Problem Relation Age of Onset  ? Cancer Mother   ?     Uterus  ? Dementia Mother   ? Uterine cancer Mother   ? Endometrial cancer Mother   ? Alzheimer's disease Father   ? Stomach cancer Sister   ? Ovarian cancer Sister   ? Cancer - Ovarian Sister   ? Diabetes Sister   ? Asthma Sister   ? Prostate cancer Brother   ? Cancer - Prostate Brother   ? Cancer Brother   ?     lung/brain  ? Breast cancer Daughter   ? Cancer Other   ? Colon cancer Neg Hx   ? Esophageal cancer Neg Hx   ? Pancreatic cancer  Neg Hx   ? Rectal cancer Neg Hx   ? Colon polyps Neg Hx   ? ? ?Social History ?Social History  ? ?Tobacco Use  ? Smoking status: Every Day  ?  Packs/day: 1.50  ?  Types: Cigarettes  ? Smokeless tobacco: Never  ? Tobacco comments:  ?  1.5 PPD  ?Vaping Use  ? Vaping Use: Never used  ?Substance Use Topics  ? Alcohol use: No  ?  Alcohol/week: 0.0 standard drinks  ? Drug use: No  ? ? ? ?  Allergies   ?Aleve [naproxen sodium] ? ? ?Review of Systems ?Review of Systems  ?Neurological:  Positive for headaches.  ?All other systems reviewed and are negative. ? ? ?Physical Exam ?Triage Vital Signs ?ED Triage Vitals [02/07/22 0958]  ?Enc Vitals Group  ?   BP (!) 144/87  ?   Pulse Rate 85  ?   Resp 18  ?   Temp (!) 96.2 ?F (35.7 ?C)  ?   Temp Source Temporal  ?   SpO2 94 %  ?   Weight   ?   Height   ?   Head Circumference   ?   Peak Flow   ?   Pain Score 7  ?   Pain Loc   ?   Pain Edu?   ?   Excl. in [place]?   ? ?No data found. ? ?Updated Vital Signs ?BP (!) 144/87 (BP Location: Left Arm)   Pulse 85   Temp (!) 96.2 ?F (35.7 ?C) (Temporal)   Resp 18   SpO2 94%  ? ?Visual Acuity ?Right Eye Distance:   ?Left Eye Distance:   ?Bilateral Distance:   ? ?Right Eye Near:   ?Left Eye Near:    ?Bilateral Near:    ? ?Physical Exam ?Vitals reviewed.  ?Constitutional:   ?   General: She is not in acute distress. ?   Appearance: Normal appearance. She is not ill-appearing.  ?HENT:  ?   Head: Normocephalic and atraumatic.  ?Eyes:  ?   Extraocular Movements: Extraocular movements intact.  ?   Pupils: Pupils are equal, round, and reactive to light.  ?   Comments: Pinpoint pupils   ?Cardiovascular:  ?   Rate and Rhythm: Normal rate and regular rhythm.  ?   Heart sounds: Normal heart sounds.  ?Pulmonary:  ?   Effort: Pulmonary effort is normal.  ?   Breath sounds: Normal breath sounds. No wheezing, rhonchi or rales.  ?Musculoskeletal:  ?   Cervical back: Normal range of motion and neck supple. No rigidity.  ?Lymphadenopathy:  ?   Cervical: No cervical  adenopathy.  ?Skin: ?   Capillary Refill: Capillary refill takes less than 2 seconds.  ?Neurological:  ?   General: No focal deficit present.  ?   Mental Status: She is alert and oriented to person, place, and

## 2022-02-07 NOTE — ED Provider Notes (Signed)
?[facility] ?Provider Note ? ? ?CSN: [id number] ?Arrival date & time: 02/07/22  1042 ? ?  ? ?History ? ?Chief Complaint  ?Patient presents with  ? Headache  ? Neck Pain  ? Vomiting  ? ? ?Deborah Arroyo is a 71 y.o. female. ? ?HPI ? ?  ? ?71 year old female with a history of rectal cancer with history of resection and loop ileostomy, cervical cancer, MDD, history of cervical pain, who presents with concern for neck pain and headache.  ? ?Had neck pain. Took expired tramadol, started gradually last night, headache worsened. Slept restlessly due to the headache. Has had nausea.  Feels different than prior migraines. Feels it is right sided neck pain now that is more significant, feels neck pain radiating to head, headache on the right side. No tearing. Occasional congestion, no sick contacts. Using lidocaine patch. Given zofran at urgent care with improvement.  One episode of vomiting at home, had several at urgent care.  Feels chills.  Denies dyspnea, chest pain ? ?Past Medical History:  ?Diagnosis Date  ? Benign neoplasm of descending colon   ? Benign neoplasm of transverse colon   ? Bowel obstruction ([place])   ? Cancer ([place]) 03/01/2018  ? rectal cancer=had chemo pills and radation   ? Cataract   ? Cervix cancer ([place]) 1979  ? Depression   ? Dyspnea   ? 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 Hypoglycemia   ? Hyponatremia   ? syncope with this and admitted twice due to this issue   ? Osteopenia   ? Osteoporosis   ? Rectal bleeding   ? Rectal cancer s/p LAR/diverting loop ileostomy 07/12/2018 03/06/2018  ? Smoker   ?  ? ?Home Medications ?Prior to Admission medications   ?Medication Sig Start Date End Date Taking? Authorizing Provider  ?acetaminophen (TYLENOL) 325 MG tablet Take 2 tablets (650 mg total) by mouth every 6 (six) hours as needed for mild pain, fever or headache (or Fever >/= 101). 06/21/19  Yes Emokpae, Courage, MD  ?alendronate (FOSAMAX) 70 MG tablet TAKE 1 TABLET BY MOUTH  ONCE WEEKLY. TAKEWITH A FULL GLASS OF WATER ON AN EMPTY STOMACH ?Patient taking differently: Take 70 mg by mouth once a week. 07/14/21  Yes Christian, Rylee, MD  ?cephALEXin (KEFLEX) 500 MG capsule Take 1 capsule (500 mg total) by mouth 2 (two) times daily for 5 days. 02/07/22 02/12/22 Yes Curatolo, Adam, DO  ?albuterol (PROVENTIL) (2.5 MG/3ML) 0.083% nebulizer solution Take 3 mLs (2.5 mg total) by nebulization every 4 (four) hours as needed for wheezing or shortness of breath. ?Patient not taking: Reported on 02/07/2022 09/16/21 09/16/22  Jose Persia, MD  ?albuterol (VENTOLIN HFA) 108 (90 Base) MCG/ACT inhaler Inhale 2 puffs into the lungs every 6 (six) hours as needed for wheezing or shortness of breath. ?Patient not taking: Reported on 02/07/2022 09/09/21   Jose Persia, MD  ?FLUoxetine (PROZAC) 40 MG capsule Take 1 capsule (40 mg total) by mouth daily. ?Patient not taking: Reported on 02/07/2022 09/16/21   Jose Persia, MD  ?revefenacin [facility]) 175 MCG/3ML nebulizer solution Take 3 mLs (175 mcg total) by nebulization daily. ?Patient not taking: Reported on 02/07/2022 10/14/21   Jose Persia, MD  ?   ? ?Allergies    ?Aleve [naproxen sodium]   ? ?Review of Systems   ?Review of Systems ? ?Physical Exam ?Updated Vital Signs ?BP 114/63   Pulse 97   Temp 97.8 ?F (36.6 ?C)   Resp 18  SpO2 (!) 89% Comment: EDP aware ?Physical Exam ?Vitals and nursing note reviewed.  ?Constitutional:   ?   General: She is not in acute distress. ?   Appearance: Normal appearance. She is well-developed. She is not ill-appearing or diaphoretic.  ?HENT:  ?   Head: Normocephalic and atraumatic.  ?Eyes:  ?   General: No visual field deficit. ?   Extraocular Movements: Extraocular movements intact.  ?   Conjunctiva/sclera: Conjunctivae normal.  ?   Pupils: Pupils are equal, round, and reactive to light.  ?Cardiovascular:  ?   Rate and Rhythm: Normal rate and regular rhythm.  ?   Pulses: Normal pulses.  ?   Heart sounds: Normal heart  sounds. No murmur heard. ?  No friction rub. No gallop.  ?Pulmonary:  ?   Effort: Pulmonary effort is normal. No respiratory distress.  ?   Breath sounds: Normal breath sounds. No wheezing or rales.  ?Abdominal:  ?   General: There is no distension.  ?   Palpations: Abdomen is soft.  ?   Tenderness: There is no abdominal tenderness. There is no guarding.  ?Musculoskeletal:     ?   General: Tenderness (right trapezius) present. No swelling.  ?   Cervical back: Normal range of motion.  ?Skin: ?   General: Skin is warm and dry.  ?   Findings: No erythema or rash.  ?Neurological:  ?   General: No focal deficit present.  ?   Mental Status: She is alert and oriented to person, place, and time.  ?   GCS: GCS eye subscore is 4. GCS verbal subscore is 5. GCS motor subscore is 6.  ?   Cranial Nerves: No cranial nerve deficit, dysarthria or facial asymmetry.  ?   Sensory: No sensory deficit.  ?   Motor: No weakness or tremor.  ?   Coordination: Coordination normal. Finger-Nose-Finger Test normal.  ?   Gait: Gait normal.  ? ? ?ED Results / Procedures / Treatments   ?Labs ?(all labs ordered are listed, but only abnormal results are displayed) ?Labs Reviewed  ?CBC WITH DIFFERENTIAL/PLATELET - Abnormal; Notable for the following components:  ?    Result Value  ? WBC 17.4 (*)   ? Platelets 431 (*)   ? Neutro Abs 14.8 (*)   ? Monocytes Absolute 1.1 (*)   ? Abs Immature Granulocytes 0.08 (*)   ? All other components within normal limits  ?COMPREHENSIVE METABOLIC PANEL - Abnormal; Notable for the following components:  ? Sodium 131 (*)   ? Chloride 96 (*)   ? Glucose, Bld 113 (*)   ? All other components within normal limits  ?URINALYSIS, ROUTINE W REFLEX MICROSCOPIC - Abnormal; Notable for the following components:  ? APPearance HAZY (*)   ? Hgb urine dipstick MODERATE (*)   ? Nitrite POSITIVE (*)   ? Leukocytes,Ua TRACE (*)   ? RBC / HPF >50 (*)   ? Bacteria, UA MANY (*)   ? All other components within normal limits  ?RESP PANEL BY  RT-PCR (FLU A&B, COVID) ARPGX2  ?URINE CULTURE  ?LACTIC ACID, PLASMA  ? ? ?EKG ?None ? ?Radiology ?CT ANGIO HEAD NECK W WO CM ? ?Result Date: 02/07/2022 ?CLINICAL DATA:  Headache, sudden, severe. EXAM: CT ANGIOGRAPHY HEAD AND NECK TECHNIQUE: Multidetector CT imaging of the head and neck was performed using the standard protocol during bolus administration of intravenous contrast. Multiplanar CT image reconstructions and MIPs were obtained to evaluate the vascular anatomy. Carotid  stenosis measurements (when applicable) are obtained utilizing NASCET criteria, using the distal internal carotid diameter as the denominator. RADIATION DOSE REDUCTION: This exam was performed according to the departmental dose-optimization program which includes automated exposure control, adjustment of the mA and/or kV according to patient size and/or use of iterative reconstruction technique. CONTRAST:  66m OMNIPAQUE IOHEXOL 350 MG/ML SOLN COMPARISON:  12/04/2020 FINDINGS: CT HEAD FINDINGS Brain: The brain shows a normal appearance without evidence of malformation, atrophy, old or acute small or large vessel infarction, mass lesion, hemorrhage, hydrocephalus or extra-axial collection. Vascular: No hyperdense vessel. No evidence of atherosclerotic calcification. Skull: Normal.  No traumatic finding.  No focal bone lesion. Sinuses/Orbits: Sinuses are clear. Orbits appear normal. Mastoids are clear. Other: None significant CTA NECK FINDINGS Aortic arch: Aortic atherosclerosis. 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 Right carotid system: Common carotid artery widely patent to the bifurcation. Carotid bifurcation is normal. Cervical ICA is widely patent. Left carotid system: Common carotid artery widely patent to the bifurcation. Focal calcified plaque in the left ICA bulb but no stenosis. Cervical ICA normal beyond that. Vertebral arteries: Both vertebral artery  origins are widely patent with the right being dominant. Both vertebral arteries are patent through the cervical region to the foramen magnum. Skeleton: Normal Other neck: No neck mass or lymphadenopathy. Upp

## 2022-02-07 NOTE — ED Notes (Signed)
Patient transported to CT 

## 2022-02-07 NOTE — ED Triage Notes (Signed)
Pt here for chronic neck and head pain that she took a tramadol for last night; pt sts some vomiting this am; pt is noted temp 96.2 ?

## 2022-02-07 NOTE — ED Triage Notes (Signed)
Pt reports intermittent headache x 3-4 months.  Reports headache worse since last night with neck pain, nausea, and vomiting. ?

## 2022-02-07 NOTE — ED Provider Notes (Signed)
Patient signed out to me awaiting CT scan of her chest, troponin, BNP.  Came with intermittent headaches and neck pain which sounds muscular in nature.  She had unremarkable CTA of her head and neck.  She has a history of COPD.  Not complaining of any shortness of breath or cough or sputum production or chest pain.  She had a headache cocktail and she was having some hypoxia events and was started on 2 L of oxygen.  Suspected that this was likely from headache cocktail.  Patient also states that she took tramadol this morning as well.  Overall CT of her chest showed no signs of infection, no PE and overall unremarkable study.  Patient did not want any further blood work including troponin, BNP, blood cultures.  She wanted to go home.  Again and biotics for possible UTI.  I ambulated the patient in the room and she had oxygen levels mostly between 87 and 95.  She did not have any episodes of sustained hypoxia.  I thought that it might be reasonable to keep her in the hospital as I think her COPD might be causing this or may be ongoing medication side effect.  But patient did not want to stay any longer for further work-up.  She has capacity to make this decision.  She is not intoxicated.  She is ambulatory without any issues.  My suspicion that this is likely her chronic oxygen levels but did think that she might be a candidate for home oxygen.  Strongly encouraged her to follow-up with her primary care doctor tomorrow.  She has a pulse oximeter at home and will continue to monitor at home.  Discharged [place]. ? ?This chart was dictated using voice recognition software.  Despite best efforts to proofread,  errors can occur which can change the documentation meaning.  ?  Lennice Sites, DO ?02/07/22 1655 ? ?

## 2022-02-07 NOTE — ED Notes (Signed)
Patient is being discharged from the Urgent Care and sent to the Emergency Department via pov . Per LG, patient is in need of higher level of care due to eval of N/V and abd pain. Patient is aware and verbalizes understanding of plan of care.  ?Vitals:  ? 02/07/22 0958  ?BP: (!) 144/87  ?Pulse: 85  ?Resp: 18  ?Temp: (!) 96.2 ?F (35.7 ?C)  ?SpO2: 94%  ?  ?

## 2022-02-07 NOTE — Discharge Instructions (Addendum)
-  Have your family member or friend take you straight to the ED. You need a scan of your head and neck  ?-If symptoms get worse on the way, like dizziness, weakness, chest pain, shortness of breath-stop and call 911 ?

## 2022-02-10 LAB — URINE CULTURE: Culture: >=100000 — AB

## 2022-02-11 ENCOUNTER — Telehealth: Payer: Self-pay

## 2022-02-11 NOTE — Telephone Encounter (Signed)
Post ED Visit - Positive Culture Follow-up ? ?Culture report reviewed by antimicrobial stewardship pharmacist: ?[place] Team ?'[x]'$  Reatha Harps, [place].D. ?'[]'$  Heide Guile, Pharm.D., BCPS AQ-ID ?'[]'$  Parks Neptune, Pharm.D., BCPS ?'[]'$  Alycia Rossetti, Pharm.D., BCPS ?'[]'$  Cushman, Pharm.D., BCPS, AAHIVP ?'[]'$  Legrand Como, Pharm.D., BCPS, AAHIVP ?'[]'$  Salome Arnt, PharmD, BCPS ?'[]'$  Johnnette Gourd, PharmD, BCPS ?'[]'$  Hughes Better, PharmD, BCPS ?'[]'$  Leeroy Cha, PharmD ?'[]'$  Laqueta Linden, PharmD, BCPS ?'[]'$  Albertina Parr, PharmD ? ?[place] Team ?'[]'$  Leodis Sias, PharmD ?'[]'$  Lindell Spar, PharmD ?'[]'$  Royetta Asal, PharmD ?'[]'$  Graylin Shiver, Rph ?'[]'$  Rema Fendt) Glennon Mac, PharmD ?'[]'$  Arlyn Dunning, PharmD ?'[]'$  Netta Cedars, PharmD ?'[]'$  Dia Sitter, PharmD ?'[]'$  Leone Haven, PharmD ?'[]'$  Gretta Arab, PharmD ?'[]'$  Theodis Shove, PharmD ?'[]'$  Peggyann Juba, PharmD ?'[]'$  Reuel Boom, PharmD ? ? ?Positive urine culture ?Treated with Cephalexin, organism sensitive to the same and no further patient follow-up is required at this time. ? ?Glennon Hamilton ?02/11/2022, 12:07 PM ?  ?

## 2022-04-02 ENCOUNTER — Ambulatory Visit (INDEPENDENT_AMBULATORY_CARE_PROVIDER_SITE_OTHER): Payer: Medicare Other | Admitting: Student

## 2022-04-02 ENCOUNTER — Encounter: Payer: Self-pay | Admitting: Student

## 2022-04-02 ENCOUNTER — Other Ambulatory Visit: Payer: Self-pay

## 2022-04-02 DIAGNOSIS — F17210 Nicotine dependence, cigarettes, uncomplicated: Secondary | ICD-10-CM

## 2022-04-02 DIAGNOSIS — F329 Major depressive disorder, single episode, unspecified: Secondary | ICD-10-CM

## 2022-04-02 DIAGNOSIS — J432 Centrilobular emphysema: Secondary | ICD-10-CM

## 2022-04-02 MED ORDER — SERTRALINE HCL 25 MG PO TABS: 25.0000 mg | ORAL_TABLET | Freq: Every day | ORAL | 2 refills | Status: DC

## 2022-04-02 NOTE — Progress Notes (Signed)
   CC: ED f/u visit, depression  HPI:  Deborah Arroyo is a 71 y.o. female with history listed below presenting to the [facility] for ED f/u visit and f/u of depression. Please see individualized problem based charting for full HPI.  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  Physical Exam:  Vitals:   04/02/22 0959  BP: 102/69  Pulse: 89  Temp: 98.4 F (36.9 C)  TempSrc: Oral  SpO2: 94%  Weight: 129 lb 11.2 oz (58.8 kg)  Height: '5\' 1"'$  (1.549 m)   Physical Exam Constitutional:      Appearance: She is normal weight. She is not ill-appearing.  HENT:     Mouth/Throat:     Mouth: Mucous membranes are moist.     Pharynx: Oropharynx is clear. No oropharyngeal exudate.  Eyes:     Extraocular Movements: Extraocular movements intact.     Conjunctiva/sclera: Conjunctivae normal.     Pupils: Pupils are equal, round, and reactive to light.  Cardiovascular:     Rate and Rhythm: Normal rate and regular rhythm.     Pulses: Normal pulses.     Heart sounds: Normal heart sounds. No murmur heard.   No gallop.  Pulmonary:     Effort: Pulmonary effort is normal. No respiratory distress.     Breath sounds: Normal breath sounds. No wheezing.  Abdominal:     General: Bowel sounds are normal. There is no distension.     Palpations: Abdomen is soft.     Tenderness: There is no abdominal tenderness.   Musculoskeletal:        General: No swelling.  Skin:    General: Skin is warm and dry.  Neurological:     General: No focal deficit present.     Mental Status: She is alert and oriented to person, place, and time.  Psychiatric:        Behavior: Behavior normal.        Thought Content: Thought content normal.        Judgment: Judgment normal.     Comments: Depressed mood     Assessment & Plan:   See Encounters Tab for problem based charting.  Patient discussed with Dr.  Saverio Danker

## 2022-04-02 NOTE — Assessment & Plan Note (Signed)
Patient with history of COPD/emphysema not on home O2 therapy. Ambulatory O2 readings during clinic visit ranging from 89-94%, which is acceptable in the setting of COPD so would not qualify for home O2 at this time.

## 2022-04-02 NOTE — Assessment & Plan Note (Signed)
Patient with history of depression and GAD, previously on prozac but she stopped taking this due to bad dreams. She does endorse continued depressive symptoms (PHQ-9 of 12, which is slightly higher than prior) and states it is primarily due to her living situation which "nobody will be able to change". Provided support. Asked if she would like to speak with a therapist for her depression but she states that she is "not the type of person to talk about her feelings with others". She denies SI/HI and lives with her son. Discussed trial of zoloft to help with symptoms, which she is interested in.   Plan: -start zoloft '25mg'$  daily, uptitrate as able if no side effects -continued PHQ-9 screenings at subsequent visits

## 2022-04-02 NOTE — Patient Instructions (Signed)
Deborah Arroyo,  It was a pleasure seeing you in the clinic today.   I have prescribed zoloft to help with your depression. Please take this medicine daily. We have filled out the temporary parking permit. Please come back in 1 month for follow up visit.  Please call our clinic at [phone] if you have any questions or concerns. The best time to call is Monday-Friday from 9am-4pm, but there is someone available 24/7 at the same number. If you need medication refills, please notify your pharmacy one week in advance and they will send [place] a request.   Thank you for letting us take part in your care. We look forward to seeing you next time!

## 2022-04-05 NOTE — Progress Notes (Signed)
Internal Medicine Clinic Attending  Case discussed with Dr. Jinwala  At the time of the visit.  We reviewed the resident's history and exam and pertinent patient test results.  I agree with the assessment, diagnosis, and plan of care documented in the resident's note.  

## 2022-05-11 ENCOUNTER — Encounter: Payer: Self-pay | Admitting: Nurse Practitioner

## 2022-05-11 ENCOUNTER — Inpatient Hospital Stay (HOSPITAL_BASED_OUTPATIENT_CLINIC_OR_DEPARTMENT_OTHER): Payer: Medicare Other | Admitting: Nurse Practitioner

## 2022-05-11 ENCOUNTER — Inpatient Hospital Stay: Payer: Medicare Other | Attending: Oncology

## 2022-05-11 VITALS — BP 128/81 | HR 71 | Temp 98.2°F | Resp 18 | Ht 61.0 in | Wt 126.8 lb

## 2022-05-11 DIAGNOSIS — Z809 Family history of malignant neoplasm, unspecified: Secondary | ICD-10-CM | POA: Insufficient documentation

## 2022-05-11 DIAGNOSIS — Z9221 Personal history of antineoplastic chemotherapy: Secondary | ICD-10-CM | POA: Insufficient documentation

## 2022-05-11 DIAGNOSIS — Z85048 Personal history of other malignant neoplasm of rectum, rectosigmoid junction, and anus: Secondary | ICD-10-CM | POA: Diagnosis not present

## 2022-05-11 DIAGNOSIS — F17210 Nicotine dependence, cigarettes, uncomplicated: Secondary | ICD-10-CM | POA: Insufficient documentation

## 2022-05-11 DIAGNOSIS — C20 Malignant neoplasm of rectum: Secondary | ICD-10-CM | POA: Diagnosis not present

## 2022-05-11 DIAGNOSIS — J449 Chronic obstructive pulmonary disease, unspecified: Secondary | ICD-10-CM | POA: Diagnosis not present

## 2022-05-11 LAB — CEA (ACCESS): CEA (CHCC): 3.17 ng/mL (ref 0.00–5.00)

## 2022-05-11 NOTE — Progress Notes (Signed)
  [facility] OFFICE PROGRESS NOTE   Diagnosis: Rectal cancer  INTERVAL HISTORY:   Deborah Arroyo returns as scheduled.  She continues to have irregular/erratic bowel habits.  No blood or pain with bowel movements.  She in general feels well.  Objective:  Vital signs in last 24 hours:  Blood pressure 128/81, pulse 71, temperature 98.2 F (36.8 C), temperature source Oral, resp. rate 18, height $RemoveBe'5\' 1"'mYeCfOcyK$  (1.549 m), weight 126 lb 12.8 oz (57.5 kg), SpO2 98 %.    Lymphatics: No palpable cervical, supraclavicular, axillary or inguinal lymph nodes. Resp: Distant breath sounds, crackles at both bases.  No respiratory distress. Cardio: Regular rate and rhythm. GI: No hepatosplenomegaly. Vascular: No leg edema.   Lab Results:  Lab Results  Component Value Date   WBC 17.4 (H) 02/07/2022   HGB 13.4 02/07/2022   HCT 40.5 02/07/2022   MCV 96.4 02/07/2022   PLT 431 (H) 02/07/2022   NEUTROABS 14.8 (H) 02/07/2022    Imaging:  No results found.  Medications: I have reviewed the patient's current medications.  Assessment/Plan: Rectal cancer, obstructing rectal mass on colonoscopy 03/01/2018- biopsy confirmed adenocarcinoma Incomplete colonoscopy secondary to obstructing tumor Staging CTs on 03/03/2018- small mediastinal/hilar lymph nodes, indeterminate lingula nodule, rectal mass, small perirectal lymph nodes, mass measured at approximately 4.9 cm from the anal verge MRI pelvis 03/11/2018- T3c,N2 Radiation/Xeloda 04/03/2018-05/11/2018 Colonoscopy 07/04/2018- mass at 8 cm from the dentate line, polyps removed from the hepatic flexure and descending colon-tubular adenomas Low anterior resection/ileostomy 07/12/2018, T3N0 moderately differentiated adenocarcinoma, margins negative, 11- lymph nodes, no lymphovascular or perineural invasion, MSI-stable, no loss of mismatch repair protein expression Cycle 1 adjuvant Xeloda 08/07/2018 Cycle 2 adjuvant Xeloda 08/28/2018 Cycle 3 adjuvant Xeloda  09/18/2018 Cycle 4 adjuvant Xeloda 10/09/2018 (Xeloda held 10/13/2018 pm dose, 10/14/2018 and 10/15/2018; resumed 10/16/2018 at a reduced dose of 1000 mg every morning and 500 mg every afternoon for the remainder of the cycle) Cycle 5 adjuvant Xeloda 10/30/2018 CTs 01/04/2019- low anterior resection with a right lower quadrant ileostomy, no evidence of metastatic disease Takedown of diverting loop ileostomy and closure of parastomal hernia 04/20/2019 Surveillance colonoscopy 07/19/2019-2 mm polyp in the ascending colon (sessile serrated polyp without cytologic dysplasia), next colonoscopy at a 3-year interval CTs 06/04/2020- no evidence of metastatic disease, centrilobular emphysema CTs 06/12/2021-no evidence of recurrent disease Tobacco use COPD Family history of multiple cancers  Disposition: Ms. Eiben remains in clinical remission from rectal cancer.  CEA from today is stable in normal range.  We discussed a CEA and follow-up visit in 6 months.  She agrees to return for CEA and follow-up visit in 1 year.  She understands she is due for surveillance colonoscopy in September of this year.  She is undecided as to if she will proceed with this.  We reviewed the rationale for the colonoscopy.  We will see her in June 2024.    Ned Card ANP/GNP-BC   05/11/2022  2:04 PM

## 2022-05-31 ENCOUNTER — Ambulatory Visit (INDEPENDENT_AMBULATORY_CARE_PROVIDER_SITE_OTHER): Payer: Medicare Other | Admitting: Internal Medicine

## 2022-05-31 VITALS — BP 116/66 | HR 80 | Temp 98.4°F | Wt 127.8 lb

## 2022-05-31 DIAGNOSIS — F329 Major depressive disorder, single episode, unspecified: Secondary | ICD-10-CM | POA: Diagnosis not present

## 2022-05-31 DIAGNOSIS — R1030 Lower abdominal pain, unspecified: Secondary | ICD-10-CM

## 2022-05-31 DIAGNOSIS — R300 Dysuria: Secondary | ICD-10-CM | POA: Diagnosis not present

## 2022-05-31 LAB — POCT URINALYSIS DIPSTICK
Bilirubin, UA: NEGATIVE
Glucose, UA: NEGATIVE
Ketones, UA: NEGATIVE
Leukocytes, UA: NEGATIVE
Nitrite, UA: NEGATIVE
Protein, UA: NEGATIVE
Spec Grav, UA: 1.015 (ref 1.010–1.025)
Urobilinogen, UA: 0.2 E.U./dL
pH, UA: 5.5 (ref 5.0–8.0)

## 2022-05-31 NOTE — Patient Instructions (Signed)
Ms. Luo,  It was a pleasure to care for you today. We discussed your discomfort when urinating and I am going to check labs on your urine for infection. Once these results come in I will call you with next steps.  Please do not hesitate to contact the clinic with further concerns.  My best, Dr. Marlou Sa

## 2022-05-31 NOTE — Assessment & Plan Note (Signed)
Patient has had mild burning with urination, at times an ache, for a few weeks now. She does not have polyuria but does endorse incomplete bladder emptying. She states that she feels her symptoms have been worse overall since she had her ileostomy reversed as she now has large amounts of stool each day and struggles to clean up well with bowel movements. She denies fever or chills. Not sexually active. She does have R CVA tenderness and suprapubic tenderness on exam. POC urine dipstick positive for blood. No history of kidney stones. She does smoke 1.5 PPD and has for 47 years. Assessment:Presentation concerning for acute cystitis with possible proximal extension given CVA tenderness. Blood on urine dipstick is concerning however given possible acute infection this will need further evaluation. She was recently treated for E. Coli UTI in April of this year. Plan: UA with reflex, urine culture. If signs of infection, will plan to start antibiotics. Will follow UA with reflex to further assess hematuria on dipstick as she may need CT renal study for kidney stone and nephrology/urology referral if no other cause is evident. If indeed there is an infection, will plan to recheck UA after antibiotic therapy is complete for resolution of hematuria.   ADDENDUM: Urine not remarkable for infection at this time. Hematuria with >30 RBC noted on microscopic reflex. I would like to rule out nephrolithiasis with CT renal stone study and, pending these results, may refer to urology if no obvious source of her hematuria is found. Patient is aware of these results and agrees to further imaging.

## 2022-05-31 NOTE — Progress Notes (Signed)
CC: 1 month follow-up  HPI:  [name] is a 71 y.o. female with past medical history as detailed below who presents for 1 month follow-up after being prescribed Zoloft for depression. She also complains of dysuria. Please see problem based charting for detailed assessment and plan.  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  Physical Exam:  Vitals:   05/31/22 1329  BP: 116/66  Pulse: 80  Temp: 98.4 F (36.9 C)  TempSrc: Oral  SpO2: 96%  Weight: 127 lb 12.8 oz (58 kg)   Constitutional:Appears stated age. In no acute distress. Cardio:Regular rate and rhythm. No murmurs, rubs, or gallops. Pulm:Diffuse expiratory wheezing. Abdomen:Positive for suprapubic tenderness. JG:GEZMOQHU for R CVA tenderness.  TML:YYTKPTWS for extremity edema. Skin:Warm and dry.  Neuro:Alert and oriented, no focal deficit noted. Psych:Pleasant mood and affect.  Assessment & Plan:   See Encounters Tab for problem based charting.  Major depressive disorder PHQ-9 of 10 today compared to 12 nearly 2 months ago.  She did not get Zoloft filled as she does not feel that she needs outside assistance in managing her depression symptoms. She is not interested in medication or therapy. She denies SI/HI.  Plan:Patient counseled on available resources for her depression and encouraged to reach out if she changes her mind on not wanting these  resources.  Dysuria Patient has had mild burning with urination, at times an ache, for a few weeks now. She does not have polyuria but does endorse incomplete bladder emptying. She states that she feels her symptoms have been worse overall since she had her ileostomy reversed as she now has large amounts of stool each day and struggles to clean up well with bowel movements. She denies fever or chills. Not sexually active. She does have R CVA tenderness and suprapubic tenderness on exam. POC urine dipstick positive for blood. No history of kidney stones. She does smoke 1.5 PPD and has for 47 years. Assessment:Presentation concerning for acute cystitis with possible proximal extension given CVA tenderness. Blood on urine dipstick is concerning however given possible acute infection this will need further evaluation. She was recently treated for E. Coli UTI in April of this year. Plan: UA with reflex, urine culture. If signs of infection, will plan to start antibiotics. Will follow UA with reflex to further assess hematuria on dipstick as she may need CT renal study for kidney stone and nephrology/urology referral if no other cause is evident. If indeed there is an infection, will plan to recheck UA after antibiotic therapy is complete for resolution of hematuria.   ADDENDUM: Urine not remarkable for infection at this time. Hematuria with >30 RBC noted on microscopic reflex. I would like to rule out nephrolithiasis with CT renal stone study and, pending these results, may refer to urology if no obvious  source of her hematuria is found. Patient is aware of these results and agrees to further imaging.    Patient discussed with Dr. Philipp Ovens

## 2022-05-31 NOTE — Assessment & Plan Note (Signed)
PHQ-9 of 10 today compared to 12 nearly 2 months ago.  She did not get Zoloft filled as she does not feel that she needs outside assistance in managing her depression symptoms. She is not interested in medication or therapy. She denies SI/HI.  Plan:Patient counseled on available resources for her depression and encouraged to reach out if she changes her mind on not wanting these resources.

## 2022-06-01 ENCOUNTER — Encounter: Payer: Self-pay | Admitting: Internal Medicine

## 2022-06-01 LAB — URINALYSIS, ROUTINE W REFLEX MICROSCOPIC
Bilirubin, UA: NEGATIVE
Glucose, UA: NEGATIVE
Ketones, UA: NEGATIVE
Leukocytes,UA: NEGATIVE
Nitrite, UA: NEGATIVE
Protein,UA: NEGATIVE
Specific Gravity, UA: 1.023 (ref 1.005–1.030)
Urobilinogen, Ur: 0.2 mg/dL (ref 0.2–1.0)
pH, UA: 5.5 (ref 5.0–7.5)

## 2022-06-01 LAB — MICROSCOPIC EXAMINATION
Casts: NONE SEEN /lpf
RBC, Urine: >30 /hpf — AB (ref 0–2)
WBC, UA: NONE SEEN /hpf (ref 0–5)

## 2022-06-01 NOTE — Addendum Note (Signed)
Addended by: Jodean Lima on: 06/01/2022 09:12 AM   Modules accepted: Level of Service

## 2022-06-01 NOTE — Progress Notes (Signed)
Internal Medicine Clinic Attending  Case discussed with Dr. Marlou Sa  At the time of the visit.  We reviewed the resident's history and exam and pertinent patient test results.  I agree with the assessment, diagnosis, and plan of care documented in the resident's note.   Complete urinalysis with microscopy showing microscopic hematuria without evidence of cystitis. Please f/u urine culture however recommend CT renal stone study. Consider urology referral pending results.

## 2022-06-02 DIAGNOSIS — Z1231 Encounter for screening mammogram for malignant neoplasm of breast: Secondary | ICD-10-CM | POA: Diagnosis not present

## 2022-06-02 LAB — URINE CULTURE

## 2022-06-02 NOTE — Addendum Note (Signed)
Addended by: Renato Battles on: 06/02/2022 08:25 AM   Modules accepted: Orders

## 2022-06-10 ENCOUNTER — Ambulatory Visit (HOSPITAL_COMMUNITY)
Admission: RE | Admit: 2022-06-10 | Discharge: 2022-06-10 | Disposition: A | Discharge status: Home / Self Care | Payer: Medicare Other | Source: Ambulatory Visit | Attending: Internal Medicine | Admitting: Internal Medicine

## 2022-06-10 DIAGNOSIS — Z9049 Acquired absence of other specified parts of digestive tract: Secondary | ICD-10-CM | POA: Diagnosis not present

## 2022-06-10 DIAGNOSIS — R300 Dysuria: Secondary | ICD-10-CM | POA: Diagnosis not present

## 2022-06-10 DIAGNOSIS — R1030 Lower abdominal pain, unspecified: Secondary | ICD-10-CM | POA: Insufficient documentation

## 2022-06-10 DIAGNOSIS — R109 Unspecified abdominal pain: Secondary | ICD-10-CM | POA: Diagnosis not present

## 2022-06-10 DIAGNOSIS — Z9889 Other specified postprocedural states: Secondary | ICD-10-CM | POA: Diagnosis not present

## 2022-06-10 DIAGNOSIS — I700 Atherosclerosis of aorta: Secondary | ICD-10-CM | POA: Diagnosis not present

## 2022-06-11 ENCOUNTER — Telehealth: Payer: Self-pay | Admitting: Internal Medicine

## 2022-06-11 DIAGNOSIS — R319 Hematuria, unspecified: Secondary | ICD-10-CM

## 2022-06-11 NOTE — Telephone Encounter (Signed)
Attempted to contact patient regarding recent CT renal study that did not show evidence of stones that could be causing hematuria. I have placed a referral to urology for further evaluation at this time. She should receive a call from their office to schedule this.  Farrel Gordon, DO

## 2022-06-11 NOTE — Telephone Encounter (Signed)
Patient rtn call to Dr. Marlou Sa about her CT results

## 2022-06-12 NOTE — Telephone Encounter (Signed)
Attempted to contact patient regarding CT results and referral to urology. Again there was no answer so I left a message with this information and encouraged patient to contact the clinic Monday with further questions.  Farrel Gordon, DO

## 2022-06-14 ENCOUNTER — Ambulatory Visit: Payer: Self-pay | Admitting: Licensed Clinical Social Worker

## 2022-06-14 NOTE — Patient Outreach (Signed)
  Care Coordination   Initial Visit Note   06/14/2022 Name: Deborah Arroyo MRN: [id number] DOB: 04/17/1951  Deborah Arroyo is a 71 y.o. year old female who sees Farrel Gordon, DO for primary care. I spoke with  Deborah Arroyo by phone today  What matters to the patients health and wellness today?  Vehicle repairs.    Goals Addressed               This Visit's Progress     MSW Care Coordination (pt-stated)        Patient needs are all currently being met.  Patient is working on getting her vehicle repaired.  SW completed SDOH screening. No needs or barriers present.          SDOH assessments and interventions completed:  Yes     Care Coordination Interventions Activated:  Yes  Care Coordination Interventions:  Yes, provided   Follow up plan: No further intervention required.   Encounter Outcome:  Pt. Visit Completed   Deborah Arroyo , MSW Social Worker [facility] Care Management  [phone]

## 2022-06-14 NOTE — Patient Instructions (Signed)
Visit Information  Instructions:   Patient was given the following information about care management and care coordination services today, agreed to services, and gave verbal consent: 1.care management/care coordination services include personalized support from designated clinical staff supervised by their physician, including individualized plan of care and coordination with other care providers 2. 24/7 contact phone numbers for assistance for urgent and routine care needs. 3. The patient may stop care management/care coordination services at any time by phone call to the office staff.  Patient verbalizes understanding of instructions and care plan provided today and agrees to view in MyChart. Active MyChart status and patient understanding of how to access instructions and care plan via MyChart confirmed with patient.     No further follow up required: .  Zian Mohamed, BSW , MSW Social Worker [facility] Care Management  [phone]      

## 2022-06-21 ENCOUNTER — Other Ambulatory Visit: Payer: Self-pay | Admitting: Internal Medicine

## 2022-06-21 DIAGNOSIS — M810 Age-related osteoporosis without current pathological fracture: Secondary | ICD-10-CM

## 2022-09-20 ENCOUNTER — Encounter: Payer: Self-pay | Admitting: Internal Medicine

## 2022-09-20 ENCOUNTER — Other Ambulatory Visit: Payer: Self-pay

## 2022-09-20 ENCOUNTER — Ambulatory Visit (AMBULATORY_SURGERY_CENTER): Payer: Self-pay | Admitting: *Deleted

## 2022-09-20 ENCOUNTER — Ambulatory Visit (INDEPENDENT_AMBULATORY_CARE_PROVIDER_SITE_OTHER): Payer: Medicare Other | Admitting: Internal Medicine

## 2022-09-20 ENCOUNTER — Ambulatory Visit: Payer: Medicare Other

## 2022-09-20 VITALS — BP 137/75 | HR 78 | Temp 97.5°F | Ht 61.0 in | Wt 129.3 lb

## 2022-09-20 VITALS — Ht 61.0 in | Wt 120.0 lb

## 2022-09-20 DIAGNOSIS — F17210 Nicotine dependence, cigarettes, uncomplicated: Secondary | ICD-10-CM | POA: Diagnosis not present

## 2022-09-20 DIAGNOSIS — R319 Hematuria, unspecified: Secondary | ICD-10-CM | POA: Diagnosis not present

## 2022-09-20 DIAGNOSIS — J432 Centrilobular emphysema: Secondary | ICD-10-CM | POA: Diagnosis not present

## 2022-09-20 DIAGNOSIS — Z85038 Personal history of other malignant neoplasm of large intestine: Secondary | ICD-10-CM

## 2022-09-20 DIAGNOSIS — Z86010 Personal history of colonic polyps: Secondary | ICD-10-CM

## 2022-09-20 MED ORDER — ALBUTEROL SULFATE (2.5 MG/3ML) 0.083% IN NEBU
2.5000 mg | INHALATION_SOLUTION | RESPIRATORY_TRACT | 2 refills | Status: AC | PRN
Start: 2022-09-20 — End: 2023-09-20

## 2022-09-20 MED ORDER — YUPELRI 175 MCG/3ML IN SOLN
175.0000 ug | Freq: Every day | RESPIRATORY_TRACT | 1 refills | Status: AC
Start: 2022-09-20 — End: ?

## 2022-09-20 MED ORDER — NA SULFATE-K SULFATE-MG SULF 17.5-3.13-1.6 GM/177ML PO SOLN: 1.0000 | Freq: Once | ORAL | 0 refills | Status: AC

## 2022-09-20 NOTE — Progress Notes (Signed)
Pre visit completed over telephone.  Instructions mailed to patient.  No egg or soy allergy known to patient  No issues known to pt with past sedation with any surgeries or procedures Patient denies ever being told they had issues or difficulty with intubation  No FH of Malignant Hyperthermia Pt is not on diet pills Pt is not on  home 02  Pt is not on blood thinners  Pt denies issues with constipation  No A fib or A flutter  Pt instructed to use Single[employer]com or GoodRx for a price reduction on prep

## 2022-09-20 NOTE — Assessment & Plan Note (Addendum)
Patient endorses having to stop for rest due to dyspnea after short distances, and specifically notes that from the [place] parking at the hospital entrance to the elevators to come down to clinic, she only makes it about half way before she has to stop. She has had PFTs in the past and has been trialed on Spiriva which was not tolerated due to inability to time pressing down to dispense medication and inhaling. She was prescribed Yupelri nebulizer but did not ever pick this up. She uses her albuterol nebulizer as needed now and is agreeable to trying daily maintenance therapy with Yupelri. No signs of acute exacerbation at this time.  She is requesting a renewal of her handicap placard which she has due to her limitations caused by COPD. Plan:Yupelri and albuterol prescriptions sent in. Will follow up in about 1 month to see how she is tolerating this medication.  Renewal for handicap placard completed.

## 2022-09-20 NOTE — Progress Notes (Signed)
   CC: Follow-up for referral for hematuria, handicap placard  HPI:  [name] is a 71 y.o. female with past medical history as detailed below who presents today for a follow-up regarding her referral to urology for hematuria and with questions about her handicap placard.  Please see problem-based charting for detail assessment and plan.  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  Physical Exam:  Vitals:   09/20/22 1419  BP: (!) 151/83  Pulse: 76  Temp: (!) 97.5 F (36.4 C)  TempSrc: Oral  SpO2: 96%  Weight: 129 lb 4.8 oz (58.7 kg)  Height: '5\' 1"'$  (1.549 m)   Constitutional: Patient appears stated age, well. In no acute distress. Cardio:Regular rate and rhythm. No murmurs, rubs, or gallops. Pulm: Bibasilar crackles with minimal right upper lobe expiratory wheezing. Normal work of breathing on room air. SWF:UXNATFTD for extremity edema. Skin:Warm and dry. Neuro:Alert and oriented x3. No focal deficit noted. Psych:Pleasant mood and affect.  Assessment & Plan:   See Encounters Tab for problem based charting.  Emphysema/COPD [facility]) Patient endorses having to stop for rest due to dyspnea after short distances, and specifically notes that from the [place] parking at the hospital entrance to the elevators to come down to clinic, she only  makes it about half way before she has to stop. She has had PFTs in the past and has been trialed on Spiriva which was not tolerated due to inability to time pressing down to dispense medication and inhaling. She was prescribed Yupelri nebulizer but did not ever pick this up. She uses her albuterol nebulizer as needed now and is agreeable to trying daily maintenance therapy with Yupelri. No signs of acute exacerbation at this time.  She is requesting a renewal of her handicap placard which she has due to her limitations caused by COPD. Plan:Yupelri and albuterol prescriptions sent in. Will follow up in about 1 month to see how she is tolerating this medication.  Renewal for handicap placard completed.  Hematuria Urology appointment has been scheduled for December 12 at 1:15 PM.  Patient discussed with Dr.  Saverio Danker

## 2022-09-20 NOTE — Patient Instructions (Signed)
Ms. Colin,  It was a pleasure to see you today!  I have sent in a new nebulizer for you to try for your COPD called yupelri. You will use it once daily. You can still use your albuterol nebulizer as needed for any breakthrough symptoms.  I would like to follow up in one month to see how you are doing on the yupelri.  My best, Dr. Marlou Sa

## 2022-09-20 NOTE — Assessment & Plan Note (Signed)
Urology appointment has been scheduled for December 12 at 1:15 PM.

## 2022-09-29 NOTE — Progress Notes (Signed)
Internal Medicine Clinic Attending ? ?Case discussed with Dr. Dean  At the time of the visit.  We reviewed the resident?s history and exam and pertinent patient test results.  I agree with the assessment, diagnosis, and plan of care documented in the resident?s note.  ?

## 2022-09-29 NOTE — Addendum Note (Signed)
Addended by: Charise Killian on: 09/29/2022 09:23 AM   Modules accepted: Level of Service

## 2022-10-06 ENCOUNTER — Encounter: Payer: Medicare Other | Admitting: Internal Medicine

## 2022-10-12 DIAGNOSIS — R300 Dysuria: Secondary | ICD-10-CM | POA: Diagnosis not present

## 2022-10-12 DIAGNOSIS — R350 Frequency of micturition: Secondary | ICD-10-CM | POA: Diagnosis not present

## 2022-10-12 DIAGNOSIS — R310 Gross hematuria: Secondary | ICD-10-CM | POA: Diagnosis not present

## 2022-10-12 DIAGNOSIS — R3915 Urgency of urination: Secondary | ICD-10-CM | POA: Diagnosis not present

## 2022-10-13 ENCOUNTER — Encounter: Payer: Medicare Other | Admitting: Internal Medicine

## 2022-10-26 DIAGNOSIS — I700 Atherosclerosis of aorta: Secondary | ICD-10-CM | POA: Diagnosis not present

## 2022-10-26 DIAGNOSIS — N281 Cyst of kidney, acquired: Secondary | ICD-10-CM | POA: Diagnosis not present

## 2022-10-26 DIAGNOSIS — R310 Gross hematuria: Secondary | ICD-10-CM | POA: Diagnosis not present

## 2022-11-22 DIAGNOSIS — H31011 Macula scars of posterior pole (postinflammatory) (post-traumatic), right eye: Secondary | ICD-10-CM | POA: Diagnosis not present

## 2022-11-22 DIAGNOSIS — H2513 Age-related nuclear cataract, bilateral: Secondary | ICD-10-CM | POA: Diagnosis not present

## 2022-11-24 DIAGNOSIS — H5213 Myopia, bilateral: Secondary | ICD-10-CM | POA: Diagnosis not present

## 2022-12-09 DIAGNOSIS — R310 Gross hematuria: Secondary | ICD-10-CM | POA: Diagnosis not present

## 2022-12-09 DIAGNOSIS — N3041 Irradiation cystitis with hematuria: Secondary | ICD-10-CM | POA: Diagnosis not present

## 2022-12-10 ENCOUNTER — Encounter: Payer: Self-pay | Admitting: Internal Medicine

## 2022-12-31 DIAGNOSIS — H524 Presbyopia: Secondary | ICD-10-CM | POA: Diagnosis not present

## 2023-04-12 ENCOUNTER — Inpatient Hospital Stay (HOSPITAL_BASED_OUTPATIENT_CLINIC_OR_DEPARTMENT_OTHER): Payer: 59 | Admitting: Oncology

## 2023-04-12 ENCOUNTER — Inpatient Hospital Stay: Payer: 59 | Attending: Oncology

## 2023-04-12 VITALS — BP 136/83 | HR 78 | Temp 98.2°F | Resp 18 | Ht 61.0 in | Wt 125.1 lb

## 2023-04-12 DIAGNOSIS — Z809 Family history of malignant neoplasm, unspecified: Secondary | ICD-10-CM | POA: Insufficient documentation

## 2023-04-12 DIAGNOSIS — C20 Malignant neoplasm of rectum: Secondary | ICD-10-CM

## 2023-04-12 DIAGNOSIS — F17210 Nicotine dependence, cigarettes, uncomplicated: Secondary | ICD-10-CM | POA: Diagnosis not present

## 2023-04-12 DIAGNOSIS — J449 Chronic obstructive pulmonary disease, unspecified: Secondary | ICD-10-CM | POA: Insufficient documentation

## 2023-04-12 LAB — CEA (ACCESS): CEA (CHCC): 3.75 ng/mL (ref 0.00–5.00)

## 2023-04-12 NOTE — Progress Notes (Signed)
[facility] OFFICE PROGRESS NOTE   Diagnosis: Rectal cancer  INTERVAL HISTORY:   Mr. Burbridge returns for scheduled visit.  She reports feeling well.  She has stable exertional dyspnea.  She continues smoking approximately 1.5 packs of cigarettes per day.  She does not want to quit smoking.  She has frequent bowel movements.  No bleeding.  She has not followed up with Dr. Rhea Belton for a surveillance colonoscopy.  Objective:  Vital signs in last 24 hours:  Blood pressure 136/83, pulse 78, temperature 98.2 F (36.8 C), temperature source Oral, resp. rate 18, height 5\' 1"  (1.549 m), weight 125 lb 1.6 oz (56.7 kg), SpO2 98 %.     Lymphatics: No cervical, supraclavicular, axillary, or inguinal nodes Resp: Distant breath sounds with scattered inspiratory rhonchi, no respiratory distress Cardio: Regular rate and rhythm GI: Soft, no mass, no hepatosplenomegaly, tender at the right costal margin over the rib Vascular: No leg edema    Lab Results:  Lab Results  Component Value Date   WBC 17.4 (H) 02/07/2022   HGB 13.4 02/07/2022   HCT 40.5 02/07/2022   MCV 96.4 02/07/2022   PLT 431 (H) 02/07/2022   NEUTROABS 14.8 (H) 02/07/2022    CMP  Lab Results  Component Value Date   NA 131 (L) 02/07/2022   K 3.8 02/07/2022   CL 96 (L) 02/07/2022   CO2 25 02/07/2022   GLUCOSE 113 (H) 02/07/2022   BUN 10 02/07/2022   CREATININE 0.82 02/07/2022   CALCIUM 9.4 02/07/2022   PROT 7.6 02/07/2022   ALBUMIN 3.8 02/07/2022   AST 22 02/07/2022   ALT 17 02/07/2022   ALKPHOS 80 02/07/2022   BILITOT 0.5 02/07/2022   GFRNONAA >60 02/07/2022   GFRAA >60 05/22/2020    Lab Results  Component Value Date   CEA1 4.13 05/11/2021   CEA 3.75 04/12/2023    Medications: I have reviewed the patient's current medications.   Assessment/Plan: Rectal cancer, obstructing rectal mass on colonoscopy 03/01/2018- biopsy confirmed adenocarcinoma Incomplete colonoscopy secondary to obstructing  tumor Staging CTs on 03/03/2018- small mediastinal/hilar lymph nodes, indeterminate lingula nodule, rectal mass, small perirectal lymph nodes, mass measured at approximately 4.9 cm from the anal verge MRI pelvis 03/11/2018- T3c,N2 Radiation/Xeloda 04/03/2018-05/11/2018 Colonoscopy 07/04/2018- mass at 8 cm from the dentate line, polyps removed from the hepatic flexure and descending colon-tubular adenomas Low anterior resection/ileostomy 07/12/2018, T3N0 moderately differentiated adenocarcinoma, margins negative, 11- lymph nodes, no lymphovascular or perineural invasion, MSI-stable, no loss of mismatch repair protein expression Cycle 1 adjuvant Xeloda 08/07/2018 Cycle 2 adjuvant Xeloda 08/28/2018 Cycle 3 adjuvant Xeloda 09/18/2018 Cycle 4 adjuvant Xeloda 10/09/2018 (Xeloda held 10/13/2018 pm dose, 10/14/2018 and 10/15/2018; resumed 10/16/2018 at a reduced dose of 1000 mg every morning and 500 mg every afternoon for the remainder of the cycle) Cycle 5 adjuvant Xeloda 10/30/2018 CTs 01/04/2019- low anterior resection with a right lower quadrant ileostomy, no evidence of metastatic disease Takedown of diverting loop ileostomy and closure of parastomal hernia 04/20/2019 Surveillance colonoscopy 07/19/2019-2 mm polyp in the ascending colon (sessile serrated polyp without cytologic dysplasia), next colonoscopy at a 3-year interval CTs 06/04/2020- no evidence of metastatic disease, centrilobular emphysema CTs 06/12/2021-no evidence of recurrent disease Tobacco use COPD Family history of multiple cancers    Disposition: Ms. Rudge remains in clinical remission from rectal cancer.  I encouraged her to follow-up with Dr. Rhea Belton for a surveillance colonoscopy.  She says she will call for an appointment.  She does not wish to continue  follow-up in the oncology clinic.  I encouraged her to discontinue smoking.  I encouraged her to schedule a follow-up with her primary provider.  Ms. Trembath is now greater than 5 years out  from rectal cancer diagnosis.  She has a good prognosis for a long-term disease-free survival.  She was discharged from medical oncology clinic today.  I am available to see her in the future as needed.  Thornton Papas, MD  04/12/2023  12:31 PM

## 2023-04-13 ENCOUNTER — Telehealth: Payer: Self-pay | Admitting: *Deleted

## 2023-04-13 NOTE — Telephone Encounter (Signed)
Ms. Serda called to request her lab test result and office note of 04/12/23 be mailed to her. Requested records mailed today.

## 2023-04-19 ENCOUNTER — Encounter: Payer: Self-pay | Admitting: Internal Medicine

## 2023-05-17 ENCOUNTER — Other Ambulatory Visit: Payer: Self-pay | Admitting: Internal Medicine

## 2023-05-17 DIAGNOSIS — M810 Age-related osteoporosis without current pathological fracture: Secondary | ICD-10-CM

## 2023-06-08 ENCOUNTER — Telehealth: Payer: Self-pay

## 2023-06-08 NOTE — Patient Outreach (Signed)
  Care Coordination   Note   06/08/2023 Name: Deborah Arroyo MRN: [id number] DOB: 09-21-1951  Received message that patient wanted to speak with this Clinical research associate.  When I called patient back she was suppose  to have a Medicare AWV and missed the call.  Notified [facility] to reschedule and call patient. Jodelle Gross, RN, BSN, CCM Care Management Coordinator [place] Phone: [phone]/Fax: [phone]

## 2023-06-08 NOTE — Progress Notes (Signed)
This encounter was created in error - please disregard.  No answer from patient.  I left a detailed message for patient to call office to reschedule.

## 2023-06-10 ENCOUNTER — Ambulatory Visit (AMBULATORY_SURGERY_CENTER): Payer: 59

## 2023-06-10 VITALS — Ht 61.0 in | Wt 120.0 lb

## 2023-06-10 DIAGNOSIS — Z800 Family history of malignant neoplasm of digestive organs: Secondary | ICD-10-CM

## 2023-06-10 DIAGNOSIS — Z86010 Personal history of colonic polyps: Secondary | ICD-10-CM

## 2023-06-10 NOTE — Progress Notes (Signed)

## 2023-07-01 ENCOUNTER — Telehealth: Payer: Self-pay | Admitting: Internal Medicine

## 2023-07-01 NOTE — Telephone Encounter (Signed)
Good Afternoon Dr. Rhea Belton,  Patient called stating she wanted to cancel her colonoscopy with you for 9/3 at 8:30 due to feeling like she has too much going on at this time.   Patient stated she will call back at a later time when she is ready to schedule.

## 2023-07-05 ENCOUNTER — Encounter: Payer: 59 | Admitting: Internal Medicine

## 2023-07-06 ENCOUNTER — Other Ambulatory Visit: Payer: Self-pay | Admitting: Student

## 2023-07-06 ENCOUNTER — Telehealth: Payer: Self-pay | Admitting: Internal Medicine

## 2023-07-06 DIAGNOSIS — Z0000 Encounter for general adult medical examination without abnormal findings: Secondary | ICD-10-CM

## 2023-07-06 NOTE — Telephone Encounter (Signed)
Rec'd a call from the pt requesting a Bone Density order be placed for September 10,2024 when she has her mammogram done.  Pt states she would like to have an order sent to [facility] [place],(External Referral Request) because she does not have a lot of money to keep going to separate appointments.  Can a External Referral be placed for this patient?

## 2023-07-07 ENCOUNTER — Other Ambulatory Visit: Payer: Self-pay | Admitting: Student

## 2023-07-07 DIAGNOSIS — Z0000 Encounter for general adult medical examination without abnormal findings: Secondary | ICD-10-CM

## 2023-07-12 DIAGNOSIS — Z1231 Encounter for screening mammogram for malignant neoplasm of breast: Secondary | ICD-10-CM | POA: Diagnosis not present

## 2023-07-12 LAB — HM MAMMOGRAPHY

## 2023-07-29 ENCOUNTER — Telehealth: Payer: Self-pay | Admitting: Internal Medicine

## 2023-07-29 DIAGNOSIS — Z1231 Encounter for screening mammogram for malignant neoplasm of breast: Secondary | ICD-10-CM

## 2023-07-29 DIAGNOSIS — M810 Age-related osteoporosis without current pathological fracture: Secondary | ICD-10-CM

## 2023-07-29 NOTE — Telephone Encounter (Signed)
Rec'd a call from the patient.  Pt states she gets her Mammograms and Dexa Scans done at [facility].  Current order in Epic for her Dexa scan is for  the internal site of  [place] but, her Mammogram order is correct .   Can a new order be placed for the  External Site "[facility] instead, as the patient is trying to get scheduled as soon as possible?

## 2023-08-02 NOTE — Addendum Note (Signed)
Addended by: Ihor Dow on: 08/02/2023 09:37 AM   Modules accepted: Orders

## 2023-08-10 ENCOUNTER — Encounter: Payer: Self-pay | Admitting: *Deleted

## 2023-08-10 ENCOUNTER — Other Ambulatory Visit: Payer: Self-pay

## 2023-08-10 ENCOUNTER — Ambulatory Visit
Admission: EM | Admit: 2023-08-10 | Discharge: 2023-08-10 | Disposition: A | Discharge status: Home / Self Care | Payer: 59 | Attending: Internal Medicine | Admitting: Internal Medicine

## 2023-08-10 ENCOUNTER — Ambulatory Visit: Payer: 59

## 2023-08-10 DIAGNOSIS — M25521 Pain in right elbow: Secondary | ICD-10-CM

## 2023-08-10 DIAGNOSIS — Z23 Encounter for immunization: Secondary | ICD-10-CM

## 2023-08-10 DIAGNOSIS — S51011A Laceration without foreign body of right elbow, initial encounter: Secondary | ICD-10-CM

## 2023-08-10 MED ORDER — TETANUS-DIPHTH-ACELL PERTUSSIS 5-2.5-18.5 LF-MCG/0.5 IM SUSY
0.5000 mL | PREFILLED_SYRINGE | Freq: Once | INTRAMUSCULAR | Status: AC
  Administered 2023-08-10: 0.5 mL via INTRAMUSCULAR

## 2023-08-10 NOTE — ED Provider Notes (Signed)
[facility]    CSN: [id number] Arrival date & time: 08/10/23  1739      History   Chief Complaint Chief Complaint  Patient presents with   Laceration    HPI Deborah Arroyo is a 72 y.o. female.   Patient presents with right elbow laceration after a fall from ladder at approximately 1 PM today.  Patient states that she was approximately 7 rungs up on the ladder when she lost her footing and fell.  She denies hitting head or losing consciousness.  Reports that she hit her right elbow.  Denies any pain in any other part of the body.  Denies that she takes blood thinning medications.  She is not sure of last tetanus vaccination.  Reports that her friend cleaned it with hydrogen peroxide and applied a dressing to the area.   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   Hypoglycemia    Hyponatremia    syncope with this and admitted twice due to this issue    Osteopenia    Osteoporosis    Rectal bleeding    Rectal cancer s/p LAR/diverting loop ileostomy 07/12/2018 03/06/2018   Smoker     Patient Active Problem List   Diagnosis Date Noted   Hematuria 09/20/2022   Housing situation unstable 10/14/2021   Polyarthralgia 09/17/2021   Major depressive disorder 08/20/2021   Emphysema/COPD (HCC) 08/20/2021   GAD (generalized anxiety disorder) 02/18/2020   Fecal incontinence with diarrhea 04/22/2019   S/P closure of ileostomy 04/20/2019   Rectal cancer s/p LAR/diverting loop ileostomy 07/12/2018 03/06/2018   Osteoporosis 12/15/2017   Rectal mass 08/25/2017   Bilateral knee pain  08/25/2017   Tobacco abuse 09/04/2015    Past Surgical History:  Procedure Laterality Date   ABDOMINAL HYSTERECTOMY     APPENDECTOMY     BIOPSY  07/04/2018   Procedure: BIOPSY;  Surgeon: Beverley Fiedler, MD;  Location: [facility];  Service: Gastroenterology;;   CHOLECYSTECTOMY     COLONOSCOPY  03/01/2018   Dr.Pyrtle   COLONOSCOPY WITH PROPOFOL N/A 07/04/2018   Procedure: COLONOSCOPY WITH PROPOFOL;  Surgeon: Beverley Fiedler, MD;  Location: [facility];  Service: Gastroenterology;  Laterality: N/A;   CYSTOSCOPY WITH STENT PLACEMENT Bilateral 07/12/2018   Procedure: CYSTOSCOPY WITH STENT PLACEMENT;  Surgeon: Crista Elliot, MD;  Location: [facility];  Service: Urology;  Laterality: Bilateral;   DIVERTING ILEOSTOMY N/A 07/12/2018   Procedure: DIVERTING LOOP ILEOSTOMY;  Surgeon: Andria Meuse, MD;  Location: [facility];  Service: General;  Laterality: N/A;   EYE SURGERY     FLEXIBLE SIGMOIDOSCOPY N/A 07/12/2018   Procedure: FLEXIBLE SIGMOIDOSCOPY;  Surgeon: Andria Meuse, MD;  Location: [facility];  Service: General;  Laterality: N/A;   FLEXIBLE SIGMOIDOSCOPY N/A 12/04/2018   Procedure: FLEXIBLE SIGMOIDOSCOPY;  Surgeon: Andria Meuse, MD;  [facility]: [facility];  Service: General;  Laterality: N/A;   ILEO LOOP COLOSTOMY CLOSURE N/A 04/20/2019   Procedure: OPEN TAKEDOWN OF ILEOSTOMY, primary repair of parastomal hernia;  Surgeon: Andria Meuse, MD;  Location: [facility];  Service: General;  Laterality: N/A;   LAPAROSCOPIC LOW ANTERIOR RESECTION N/A 07/12/2018   Procedure: LAPAROSCOPIC LOW ANTERIOR RESECTION WITH COLOPROSTOSTOMY, ERAS PATHWAY;  Surgeon: Andria Meuse, MD;  Location: [facility];  Service: General;  Laterality: N/A;   POLYPECTOMY  07/04/2018   Procedure: POLYPECTOMY;  Surgeon: Beverley Fiedler, MD;  Location: [facility];  Service: Gastroenterology;;   SHOULDER SURGERY Left    Rotary Cuff Tear   TUBAL LIGATION      OB History     Gravida  4   Para  3   Term  3    Preterm      AB  1   Living  3      SAB      IAB  1   Ectopic      Multiple      Live Births               Home Medications    Prior to Admission medications   Medication Sig Start Date End Date Taking? Authorizing Provider  acetaminophen (TYLENOL) 325 MG tablet Take 2 tablets (650 mg total) by mouth every 6 (six) hours as needed for mild pain, fever or headache (or Fever >/= 101). 06/21/19  Yes Emokpae, Courage, MD  alendronate (FOSAMAX) 70 MG tablet TAKE 1 TABLET BY MOUTH ONCE WEEKLY. TAKE WITH A FULL GLASS OF WATER ON EMPTY STOMACH 05/18/23  Yes Champ Mungo, DO  albuterol (PROVENTIL) (2.5 MG/3ML) 0.083% nebulizer solution Take 3 mLs (2.5 mg total) by nebulization every 4 (four) hours as needed for wheezing or shortness of breath. Patient not taking: Reported on 06/10/2023 09/20/22 09/20/23  Champ Mungo, DO  revefenacin (YUPELRI) 175 MCG/3ML nebulizer solution Take 3 mLs (175 mcg total) by nebulization daily. Patient not taking: Reported on 06/10/2023 09/20/22   Champ Mungo, DO    Family History Family History  Problem Relation Age of Onset   Cancer Mother        Uterus   Dementia Mother    Uterine cancer Mother    Endometrial cancer Mother    Alzheimer's disease Father    Stomach cancer Sister    Ovarian cancer Sister    Cancer - Ovarian Sister    Diabetes Sister    Asthma Sister    Prostate cancer Brother    Cancer - Prostate Brother    Cancer Brother        lung/brain   Breast cancer Daughter    Cancer Other    Colon cancer Neg Hx    Esophageal cancer Neg Hx    Pancreatic cancer Neg Hx    Rectal cancer Neg Hx    Colon polyps Neg Hx     Social History Social History   Tobacco Use   Smoking status: Every Day    Current packs/day: 1.50    Types: Cigarettes   Smokeless tobacco: Never   Tobacco comments:    1.5 PPD  Vaping Use   Vaping status: Never Used  Substance Use Topics   Alcohol use: No    Alcohol/week: 0.0 standard drinks of alcohol    Drug use: No     Allergies   Aleve [naproxen sodium]   Review of Systems Review of Systems Per HPI  Physical Exam Triage Vital Signs ED Triage Vitals  Encounter Vitals Group     BP 08/10/23 1809 (!) 156/87     Systolic BP Percentile --  Diastolic BP Percentile --      Pulse Rate 08/10/23 1809 82     Resp 08/10/23 1809 18     Temp 08/10/23 1809 97.8 F (36.6 C)     Temp Source 08/10/23 1809 Oral     SpO2 08/10/23 1809 99 %     Weight --      Height --      Head Circumference --      Peak Flow --      Pain Score 08/10/23 1806 7     Pain Loc --      Pain Education --      Exclude from Growth Chart --    No data found.  Updated Vital Signs BP (!) 156/87 (BP Location: Left Arm)   Pulse 82   Temp 97.8 F (36.6 C) (Oral)   Resp 18   SpO2 99%   Visual Acuity Right Eye Distance:   Left Eye Distance:   Bilateral Distance:    Right Eye Near:   Left Eye Near:    Bilateral Near:     Physical Exam Constitutional:      General: She is not in acute distress.    Appearance: Normal appearance. She is not toxic-appearing or diaphoretic.  HENT:     Head: Normocephalic and atraumatic.  Eyes:     Extraocular Movements: Extraocular movements intact.     Conjunctiva/sclera: Conjunctivae normal.  Pulmonary:     Effort: Pulmonary effort is normal.  Musculoskeletal:     Comments: Tenderness to palpation surrounding the skin tear on the right posterior elbow.  Patient has full range of motion of elbow.  Grip strength is 5/5.  Neurovascularly intact.  Skin:    Comments: Patient has approximately 2 inch x 2 inch superficial skin tear present to right posterior elbow overlying the right olecranon. Bleeding controlled.   Neurological:     General: No focal deficit present.     Mental Status: She is alert and oriented to person, place, and time. Mental status is at baseline.  Psychiatric:        Mood and Affect: Mood normal.        Behavior: Behavior normal.         Thought Content: Thought content normal.        Judgment: Judgment normal.      UC Treatments / Results  Labs (all labs ordered are listed, but only abnormal results are displayed) Labs Reviewed - No data to display  EKG   Radiology DG Elbow Complete Right  Result Date: 08/10/2023 CLINICAL DATA:  Fall with elbow pain. EXAM: RIGHT ELBOW - COMPLETE 3+ VIEW COMPARISON:  None Available. FINDINGS: There is no evidence of fracture, dislocation, or joint effusion. Small chronic appearing soft tissue calcification adjacent to the lateral humeral epicondyle. No radiopaque foreign body. IMPRESSION: 1. No fracture or dislocation of the right elbow. 2. Small chronic appearing soft tissue calcification adjacent to the lateral humeral epicondyle. Electronically Signed   By: Narda Rutherford M.D.   On: 08/10/2023 20:56    Procedures Procedures (including critical care time)  Medications Ordered in UC Medications  Tdap (BOOSTRIX) injection 0.5 mL (0.5 mLs Intramuscular Given 08/10/23 1909)    Initial Impression / Assessment and Plan / UC Course  I have reviewed the triage vital signs and the nursing notes.  Pertinent labs & imaging results that were available during my care of the patient were reviewed by me and considered in my medical decision making (see  chart for details).     Patient here from fall from ladder but did not hit head and denies any other injuries other than right elbow pain so do not think that emergent evaluation is necessary.  X-ray of right elbow was negative for any acute bony abnormality.  Patient does have minimal skin tear to right elbow.  This was cleaned and dressed by clinical staff per order.  Advised patient of dressing changes at home and monitoring for signs of infection as well.  Tetanus vaccine updated today.  Advised strict follow-up precautions.  Patient verbalized understanding and was agreeable with plan. Final Clinical Impressions(s) / UC Diagnoses   Final  diagnoses:  Right elbow pain  Skin tear of right elbow without complication, initial encounter     Discharge Instructions      I will call with x-ray results.  Change dressing daily and as needed if it becomes dirty.  Monitor for signs of infection that include increased redness, swelling, pus and follow-up sooner if they occur.    ED Prescriptions   None    PDMP not reviewed this encounter.   Gustavus Bryant, [place] 08/11/23 [phone]

## 2023-08-10 NOTE — ED Triage Notes (Signed)
Pt reports she fell off a ladder (estimates 4-5 ft) and landed on hard flooring. States "I ripped all the skin off my elbow". Dressing in place from home. States it was cleaned with peroxide pta

## 2023-08-10 NOTE — Discharge Instructions (Signed)
I will call with x-ray results.  Change dressing daily and as needed if it becomes dirty.  Monitor for signs of infection that include increased redness, swelling, pus and follow-up sooner if they occur.

## 2023-08-15 ENCOUNTER — Ambulatory Visit
Admission: EM | Admit: 2023-08-15 | Discharge: 2023-08-15 | Disposition: A | Discharge status: Home / Self Care | Payer: 59 | Attending: Internal Medicine | Admitting: Internal Medicine

## 2023-08-15 DIAGNOSIS — L03113 Cellulitis of right upper limb: Secondary | ICD-10-CM | POA: Diagnosis not present

## 2023-08-15 MED ORDER — CEPHALEXIN 500 MG PO CAPS: 500.0000 mg | ORAL_CAPSULE | Freq: Four times a day (QID) | ORAL | 0 refills | Status: AC

## 2023-08-15 NOTE — Discharge Instructions (Signed)
Please follow-up on Friday to have recheck of elbow laceration.  I have prescribed an antibiotic given concern for infection.  Continue to change dressing daily and monitor closely.  You may check [facility] at [address] here in [place] to see if they have any dressing supplies.

## 2023-08-15 NOTE — ED Provider Notes (Signed)
[facility]    CSN: [id number] Arrival date & time: 08/15/23  1056      History   Chief Complaint Chief Complaint  Patient presents with   Wound Check    HPI Deborah Arroyo is a 72 y.o. female.   Patient presents with wound check to skin tear to right elbow.  Patient was seen on 08/10/2023 for fall and skin tear to right elbow.  Patient states that she is concerned given she noticed increased pain and swelling with purulent drainage to the skin tear.  States that she has been changing dressings daily and applying antibiotic ointment.  Denies any associated fever.   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   Hypoglycemia    Hyponatremia    syncope with this and admitted twice due to this issue    Osteopenia    Osteoporosis    Rectal bleeding    Rectal cancer s/p LAR/diverting loop ileostomy 07/12/2018 03/06/2018   Smoker     Patient Active Problem List   Diagnosis Date Noted   Hematuria 09/20/2022   Housing situation unstable 10/14/2021   Polyarthralgia 09/17/2021   Major depressive disorder 08/20/2021   Emphysema/COPD (HCC) 08/20/2021   GAD (generalized anxiety disorder) 02/18/2020   Fecal incontinence with diarrhea 04/22/2019   S/P closure of ileostomy 04/20/2019   Rectal cancer s/p LAR/diverting loop ileostomy 07/12/2018 03/06/2018   Osteoporosis 12/15/2017   Rectal mass 08/25/2017   Bilateral knee pain 08/25/2017   Tobacco abuse 09/04/2015    Past Surgical History:  Procedure Laterality Date   ABDOMINAL HYSTERECTOMY     APPENDECTOMY     BIOPSY   07/04/2018   Procedure: BIOPSY;  Surgeon: Beverley Fiedler, MD;  Location: [facility];  Service: Gastroenterology;;   CHOLECYSTECTOMY     COLONOSCOPY  03/01/2018   Dr.Pyrtle   COLONOSCOPY WITH PROPOFOL N/A 07/04/2018   Procedure: COLONOSCOPY WITH PROPOFOL;  Surgeon: Beverley Fiedler, MD;  Location: [facility];  Service: Gastroenterology;  Laterality: N/A;   CYSTOSCOPY WITH STENT PLACEMENT Bilateral 07/12/2018   Procedure: CYSTOSCOPY WITH STENT PLACEMENT;  Surgeon: Crista Elliot, MD;  Location: [facility];  Service: Urology;  Laterality: Bilateral;   DIVERTING ILEOSTOMY N/A 07/12/2018   Procedure: DIVERTING LOOP ILEOSTOMY;  Surgeon: Andria Meuse, MD;  Location: [facility];  Service: General;  Laterality: N/A;   EYE SURGERY     FLEXIBLE SIGMOIDOSCOPY N/A 07/12/2018   Procedure: FLEXIBLE SIGMOIDOSCOPY;  Surgeon: Andria Meuse, MD;  Location: [facility];  Service: General;  Laterality: N/A;   FLEXIBLE SIGMOIDOSCOPY N/A 12/04/2018   Procedure: FLEXIBLE SIGMOIDOSCOPY;  Surgeon: Andria Meuse, MD;  Location: [facility];  Service: General;  Laterality: N/A;   ILEO LOOP COLOSTOMY CLOSURE N/A 04/20/2019   Procedure: OPEN TAKEDOWN OF ILEOSTOMY, primary repair of parastomal hernia;  Surgeon: Andria Meuse, MD;  Location: [facility];  Service: General;  Laterality: N/A;   LAPAROSCOPIC LOW ANTERIOR RESECTION N/A 07/12/2018   Procedure: LAPAROSCOPIC LOW ANTERIOR RESECTION  WITH COLOPROSTOSTOMY, ERAS PATHWAY;  Surgeon: Andria Meuse, MD;  Location: [facility];  Service: General;  Laterality: N/A;   POLYPECTOMY  07/04/2018   Procedure: POLYPECTOMY;  Surgeon: Beverley Fiedler, MD;  Location: [facility];  Service: Gastroenterology;;   SHOULDER SURGERY Left    Rotary Cuff Tear   TUBAL LIGATION      OB History     Gravida  4   Para  3   Term  3   Preterm      AB  1   Living  3      SAB      IAB  1   Ectopic      Multiple      Live Births               Home Medications     Prior to Admission medications   Medication Sig Start Date End Date Taking? Authorizing Provider  alendronate (FOSAMAX) 70 MG tablet TAKE 1 TABLET BY MOUTH ONCE WEEKLY. TAKE WITH A FULL GLASS OF WATER ON EMPTY STOMACH 05/18/23  Yes Champ Mungo, DO  cephALEXin (KEFLEX) 500 MG capsule Take 1 capsule (500 mg total) by mouth 4 (four) times daily for 5 days. 08/15/23 08/20/23 Yes Alixandria Friedt, Acie Fredrickson, FNP  acetaminophen (TYLENOL) 325 MG tablet Take 2 tablets (650 mg total) by mouth every 6 (six) hours as needed for mild pain, fever or headache (or Fever >/= 101). 06/21/19   Shon Hale, MD  albuterol (PROVENTIL) (2.5 MG/3ML) 0.083% nebulizer solution Take 3 mLs (2.5 mg total) by nebulization every 4 (four) hours as needed for wheezing or shortness of breath. Patient not taking: Reported on 06/10/2023 09/20/22 09/20/23  Champ Mungo, DO  revefenacin (YUPELRI) 175 MCG/3ML nebulizer solution Take 3 mLs (175 mcg total) by nebulization daily. Patient not taking: Reported on 06/10/2023 09/20/22   Champ Mungo, DO    Family History Family History  Problem Relation Age of Onset   Cancer Mother        Uterus   Dementia Mother    Uterine cancer Mother    Endometrial cancer Mother    Alzheimer's disease Father    Stomach cancer Sister    Ovarian cancer Sister    Cancer - Ovarian Sister    Diabetes Sister    Asthma Sister    Prostate cancer Brother    Cancer - Prostate Brother    Cancer Brother        lung/brain   Breast cancer Daughter    Cancer Other    Colon cancer Neg Hx    Esophageal cancer Neg Hx    Pancreatic cancer Neg Hx    Rectal cancer Neg Hx    Colon polyps Neg Hx     Social History Social History   Tobacco Use   Smoking status: Every Day    Current packs/day: 1.50    Types: Cigarettes   Smokeless tobacco: Never   Tobacco comments:    1.5 PPD  Vaping Use   Vaping status: Never Used  Substance Use Topics   Alcohol use: No    Alcohol/week: 0.0 standard drinks of alcohol    Drug use: No     Allergies   Aleve [naproxen sodium]   Review of Systems Review of Systems Per HPI  Physical Exam Triage Vital Signs ED Triage Vitals  Encounter Vitals Group     BP 08/15/23 1208 139/81     Systolic BP Percentile --  Diastolic BP Percentile --      Pulse Rate 08/15/23 1208 71     Resp 08/15/23 1208 18     Temp 08/15/23 1208 (!) 97.5 F (36.4 C)     Temp Source 08/15/23 1208 Oral     SpO2 08/15/23 1208 94 %     Weight --      Height --      Head Circumference --      Peak Flow --      Pain Score 08/15/23 1210 5     Pain Loc --      Pain Education --      Exclude from Growth Chart --    No data found.  Updated Vital Signs BP 139/81 (BP Location: Left Arm)   Pulse 71   Temp (!) 97.5 F (36.4 C) (Oral)   Resp 18   SpO2 94% Comment: smoker  Visual Acuity Right Eye Distance:   Left Eye Distance:   Bilateral Distance:    Right Eye Near:   Left Eye Near:    Bilateral Near:     Physical Exam Constitutional:      General: She is not in acute distress.    Appearance: Normal appearance. She is not toxic-appearing or diaphoretic.  HENT:     Head: Normocephalic and atraumatic.  Eyes:     Extraocular Movements: Extraocular movements intact.     Conjunctiva/sclera: Conjunctivae normal.  Pulmonary:     Effort: Pulmonary effort is normal.  Skin:    Comments: Patient has skin tear to right elbow similar to previous evaluation but there is some increased redness, swelling and a mild amount of purulent drainage noted.  Full range of motion of elbow present and patient is neurovascularly intact.  Neurological:     General: No focal deficit present.     Mental Status: She is alert and oriented to person, place, and time. Mental status is at baseline.  Psychiatric:        Mood and Affect: Mood normal.        Behavior: Behavior normal.        Thought Content: Thought content normal.        Judgment: Judgment normal.      UC Treatments / Results   Labs (all labs ordered are listed, but only abnormal results are displayed) Labs Reviewed - No data to display  EKG   Radiology No results found.  Procedures Procedures (including critical care time)  Medications Ordered in UC Medications - No data to display  Initial Impression / Assessment and Plan / UC Course  I have reviewed the triage vital signs and the nursing notes.  Pertinent labs & imaging results that were available during my care of the patient were reviewed by me and considered in my medical decision making (see chart for details).     I am concerned for mild infection present to skin tear of right elbow.  I do not have any concern for more significant infection such as osteomyelitis or need for emergent evaluation so imaging is not necessary at this time.  Will treat with cephalexin.  Crcl is normal so no dosage adjustment necessary.  Dressing changed by clinical staff and patient advised of dressing changes.  Advised patient to return to urgent care or PCP in a few days to have reevaluation of wound.  Patient verbalized understanding and was agreeable with plan. Final Clinical Impressions(s) / UC Diagnoses   Final diagnoses:  Cellulitis of right elbow  Discharge Instructions      Please follow-up on Friday to have recheck of elbow laceration.  I have prescribed an antibiotic given concern for infection.  Continue to change dressing daily and monitor closely.  You may check [facility] at [address] here in [place] to see if they have any dressing supplies.    ED Prescriptions     Medication Sig Dispense Auth. Provider   cephALEXin (KEFLEX) 500 MG capsule Take 1 capsule (500 mg total) by mouth 4 (four) times daily for 5 days. 20 capsule Gustavus Bryant, [place]      PDMP not reviewed this encounter.   Gustavus Bryant, [place] 08/15/23 1355

## 2023-08-15 NOTE — ED Triage Notes (Signed)
Pt is here for wound check on her right elbow. Patient reports some drainage.

## 2023-08-19 ENCOUNTER — Ambulatory Visit
Admission: EM | Admit: 2023-08-19 | Discharge: 2023-08-19 | Disposition: A | Discharge status: Home / Self Care | Payer: 59 | Attending: Internal Medicine | Admitting: Internal Medicine

## 2023-08-19 VITALS — BP 138/84 | HR 81 | Temp 97.5°F | Resp 16

## 2023-08-19 DIAGNOSIS — L03113 Cellulitis of right upper limb: Secondary | ICD-10-CM

## 2023-08-19 DIAGNOSIS — Z5189 Encounter for other specified aftercare: Secondary | ICD-10-CM | POA: Diagnosis not present

## 2023-08-19 NOTE — Discharge Instructions (Signed)
Continuing complete antibiotics.  Follow-up with PCP or urgent care if symptoms persist or worsen or if healing does not occur.

## 2023-08-19 NOTE — ED Provider Notes (Signed)
[facility]    CSN: [id number] Arrival date & time: 08/19/23  8841      History   Chief Complaint Chief Complaint  Patient presents with   Wound Check    HPI Deborah Arroyo is a 72 y.o. female.   Patient presents today for wound recheck.  She was seen on 08/20/2023 and had a skin tear to the right elbow from a fall.  She returned on 08/15/2023 given concern of infection to that skin tear.  She was then started on cephalexin antibiotic and is here today for a wound recheck to ensure appropriate healing.  Denies any fever.  Reports some minimal pain to the area.   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   Hypoglycemia    Hyponatremia    syncope with this and admitted twice due to this issue    Osteopenia    Osteoporosis    Rectal bleeding    Rectal cancer s/p LAR/diverting loop ileostomy 07/12/2018 03/06/2018   Smoker     Patient Active Problem List   Diagnosis Date Noted   Hematuria 09/20/2022   Housing situation unstable 10/14/2021   Polyarthralgia 09/17/2021   Major depressive disorder 08/20/2021   Emphysema/COPD (HCC) 08/20/2021   GAD (generalized anxiety disorder) 02/18/2020   Fecal incontinence with diarrhea 04/22/2019   S/P closure of ileostomy 04/20/2019   Rectal cancer s/p LAR/diverting loop ileostomy 07/12/2018 03/06/2018   Osteoporosis 12/15/2017   Rectal mass 08/25/2017   Bilateral knee pain 08/25/2017   Tobacco abuse 09/04/2015    Past Surgical History:  Procedure Laterality Date   ABDOMINAL HYSTERECTOMY     APPENDECTOMY     BIOPSY  07/04/2018    Procedure: BIOPSY;  Surgeon: Beverley Fiedler, MD;  Location: [facility];  Service: Gastroenterology;;   CHOLECYSTECTOMY     COLONOSCOPY  03/01/2018   Dr.Pyrtle   COLONOSCOPY WITH PROPOFOL N/A 07/04/2018   Procedure: COLONOSCOPY WITH PROPOFOL;  Surgeon: Beverley Fiedler, MD;  Location: [facility];  Service: Gastroenterology;  Laterality: N/A;   CYSTOSCOPY WITH STENT PLACEMENT Bilateral 07/12/2018   Procedure: CYSTOSCOPY WITH STENT PLACEMENT;  Surgeon: Crista Elliot, MD;  Location: [facility];  Service: Urology;  Laterality: Bilateral;   DIVERTING ILEOSTOMY N/A 07/12/2018   Procedure: DIVERTING LOOP ILEOSTOMY;  Surgeon: Andria Meuse, MD;  Location: [facility];  Service: General;  Laterality: N/A;   EYE SURGERY     FLEXIBLE SIGMOIDOSCOPY N/A 07/12/2018   Procedure: FLEXIBLE SIGMOIDOSCOPY;  Surgeon: Andria Meuse, MD;  Location: [facility];  Service: General;  Laterality: N/A;   FLEXIBLE SIGMOIDOSCOPY N/A 12/04/2018   Procedure: FLEXIBLE SIGMOIDOSCOPY;  Surgeon: Andria Meuse, MD;  Location: [facility];  Service: General;  Laterality: N/A;   ILEO LOOP COLOSTOMY CLOSURE N/A 04/20/2019   Procedure: OPEN TAKEDOWN OF ILEOSTOMY, primary repair of parastomal hernia;  Surgeon: Andria Meuse, MD;  Location: [facility];  Service: General;  Laterality: N/A;   LAPAROSCOPIC LOW ANTERIOR RESECTION N/A 07/12/2018  Procedure: LAPAROSCOPIC LOW ANTERIOR RESECTION WITH COLOPROSTOSTOMY, ERAS PATHWAY;  Surgeon: Andria Meuse, MD;  Location: [facility];  Service: General;  Laterality: N/A;   POLYPECTOMY  07/04/2018   Procedure: POLYPECTOMY;  Surgeon: Beverley Fiedler, MD;  Location: [facility];  Service: Gastroenterology;;   SHOULDER SURGERY Left    Rotary Cuff Tear   TUBAL LIGATION      OB History     Gravida  4   Para  3   Term  3   Preterm      AB  1   Living  3      SAB      IAB  1   Ectopic      Multiple      Live Births               Home Medications    Prior to  Admission medications   Medication Sig Start Date End Date Taking? Authorizing Provider  acetaminophen (TYLENOL) 325 MG tablet Take 2 tablets (650 mg total) by mouth every 6 (six) hours as needed for mild pain, fever or headache (or Fever >/= 101). 06/21/19  Yes Emokpae, Courage, MD  alendronate (FOSAMAX) 70 MG tablet TAKE 1 TABLET BY MOUTH ONCE WEEKLY. TAKE WITH A FULL GLASS OF WATER ON EMPTY STOMACH 05/18/23  Yes Champ Mungo, DO  cephALEXin (KEFLEX) 500 MG capsule Take 1 capsule (500 mg total) by mouth 4 (four) times daily for 5 days. 08/15/23 08/20/23 Yes Nicolasa Milbrath, Rolly Salter E, FNP  albuterol (PROVENTIL) (2.5 MG/3ML) 0.083% nebulizer solution Take 3 mLs (2.5 mg total) by nebulization every 4 (four) hours as needed for wheezing or shortness of breath. Patient not taking: Reported on 06/10/2023 09/20/22 09/20/23  Champ Mungo, DO  revefenacin (YUPELRI) 175 MCG/3ML nebulizer solution Take 3 mLs (175 mcg total) by nebulization daily. Patient not taking: Reported on 06/10/2023 09/20/22   Champ Mungo, DO    Family History Family History  Problem Relation Age of Onset   Cancer Mother        Uterus   Dementia Mother    Uterine cancer Mother    Endometrial cancer Mother    Alzheimer's disease Father    Stomach cancer Sister    Ovarian cancer Sister    Cancer - Ovarian Sister    Diabetes Sister    Asthma Sister    Prostate cancer Brother    Cancer - Prostate Brother    Cancer Brother        lung/brain   Breast cancer Daughter    Cancer Other    Colon cancer Neg Hx    Esophageal cancer Neg Hx    Pancreatic cancer Neg Hx    Rectal cancer Neg Hx    Colon polyps Neg Hx     Social History Social History   Tobacco Use   Smoking status: Every Day    Current packs/day: 1.50    Types: Cigarettes   Smokeless tobacco: Never   Tobacco comments:    1.5 PPD  Vaping Use   Vaping status: Never Used  Substance Use Topics   Alcohol use: No    Alcohol/week: 0.0 standard drinks of alcohol   Drug use: No      Allergies   Aleve [naproxen sodium]   Review of Systems Review of Systems Per HPI  Physical Exam Triage Vital Signs ED Triage Vitals  Encounter Vitals Group     BP 08/19/23 0850 138/84     Systolic BP Percentile --  Diastolic BP Percentile --      Pulse Rate 08/19/23 0850 81     Resp 08/19/23 0850 16     Temp 08/19/23 0850 (!) 97.5 F (36.4 C)     Temp Source 08/19/23 0850 Oral     SpO2 08/19/23 0850 94 %     Weight --      Height --      Head Circumference --      Peak Flow --      Pain Score 08/19/23 0851 6     Pain Loc --      Pain Education --      Exclude from Growth Chart --    No data found.  Updated Vital Signs BP 138/84 (BP Location: Left Arm)   Pulse 81   Temp (!) 97.5 F (36.4 C) (Oral)   Resp 16   SpO2 94%   Visual Acuity Right Eye Distance:   Left Eye Distance:   Bilateral Distance:    Right Eye Near:   Left Eye Near:    Bilateral Near:     Physical Exam Constitutional:      General: She is not in acute distress.    Appearance: Normal appearance. She is not toxic-appearing or diaphoretic.  HENT:     Head: Normocephalic and atraumatic.  Eyes:     Extraocular Movements: Extraocular movements intact.     Conjunctiva/sclera: Conjunctivae normal.  Pulmonary:     Effort: Pulmonary effort is normal.  Skin:    Comments: Skin tear to right elbow is much improved.  Very minimal swelling with no purulent drainage.  Appears to be scabbed over with mild erythema.  Patient has full range of motion of elbow and appears neurovascularly intact.  Neurological:     General: No focal deficit present.     Mental Status: She is alert and oriented to person, place, and time. Mental status is at baseline.  Psychiatric:        Mood and Affect: Mood normal.        Behavior: Behavior normal.        Thought Content: Thought content normal.        Judgment: Judgment normal.      UC Treatments / Results  Labs (all labs ordered are listed, but  only abnormal results are displayed) Labs Reviewed - No data to display  EKG   Radiology No results found.  Procedures Procedures (including critical care time)  Medications Ordered in UC Medications - No data to display  Initial Impression / Assessment and Plan / UC Course  I have reviewed the triage vital signs and the nursing notes.  Pertinent labs & imaging results that were available during my care of the patient were reviewed by me and considered in my medical decision making (see chart for details).     Skin tear appears to be healing well with no significant signs of persistent or worsening infection.  Advised patient to continue and complete cephalexin.  Advised to follow-up with PCP or urgent care if symptoms persist or worsen.  Patient verbalized understanding and was agreeable with plan. Final Clinical Impressions(s) / UC Diagnoses   Final diagnoses:  Visit for wound check  Cellulitis of right elbow     Discharge Instructions      Continuing complete antibiotics.  Follow-up with PCP or urgent care if symptoms persist or worsen or if healing does not occur.    ED Prescriptions   None    PDMP  not reviewed this encounter.   Gustavus Bryant, [place] 08/19/23 [phone]

## 2023-08-19 NOTE — ED Triage Notes (Signed)
Pt presents for re-check of wound to right elbow. Pt states it is doing better but still very painful.

## 2023-08-22 NOTE — Plan of Care (Signed)
[facility] Tonsillectomy/Adenoidectomy, Postoperative PEDS care plan entered in error.

## 2023-08-30 ENCOUNTER — Ambulatory Visit (HOSPITAL_COMMUNITY)
Admission: RE | Admit: 2023-08-30 | Discharge: 2023-08-30 | Disposition: A | Discharge status: Home / Self Care | Payer: 59 | Source: Ambulatory Visit | Attending: Student in an Organized Health Care Education/Training Program | Admitting: Student in an Organized Health Care Education/Training Program

## 2023-08-30 ENCOUNTER — Encounter: Payer: Self-pay | Admitting: Internal Medicine

## 2023-08-30 ENCOUNTER — Ambulatory Visit: Payer: 59 | Admitting: Internal Medicine

## 2023-08-30 ENCOUNTER — Other Ambulatory Visit: Payer: Self-pay

## 2023-08-30 VITALS — BP 118/73 | HR 89 | Temp 98.1°F | Ht 61.0 in | Wt 123.1 lb

## 2023-08-30 DIAGNOSIS — M545 Low back pain, unspecified: Secondary | ICD-10-CM | POA: Insufficient documentation

## 2023-08-30 DIAGNOSIS — M25561 Pain in right knee: Secondary | ICD-10-CM

## 2023-08-30 DIAGNOSIS — M8000XA Age-related osteoporosis with current pathological fracture, unspecified site, initial encounter for fracture: Secondary | ICD-10-CM

## 2023-08-30 DIAGNOSIS — M5134 Other intervertebral disc degeneration, thoracic region: Secondary | ICD-10-CM | POA: Diagnosis not present

## 2023-08-30 DIAGNOSIS — M810 Age-related osteoporosis without current pathological fracture: Secondary | ICD-10-CM | POA: Diagnosis not present

## 2023-08-30 DIAGNOSIS — M4804 Spinal stenosis, thoracic region: Secondary | ICD-10-CM | POA: Diagnosis not present

## 2023-08-30 DIAGNOSIS — M549 Dorsalgia, unspecified: Secondary | ICD-10-CM | POA: Diagnosis not present

## 2023-08-30 DIAGNOSIS — I878 Other specified disorders of veins: Secondary | ICD-10-CM | POA: Diagnosis not present

## 2023-08-30 DIAGNOSIS — M47817 Spondylosis without myelopathy or radiculopathy, lumbosacral region: Secondary | ICD-10-CM | POA: Diagnosis not present

## 2023-08-30 DIAGNOSIS — M25562 Pain in left knee: Secondary | ICD-10-CM | POA: Diagnosis not present

## 2023-08-30 DIAGNOSIS — G8929 Other chronic pain: Secondary | ICD-10-CM

## 2023-08-30 MED ORDER — DICLOFENAC SODIUM 1 % EX GEL: 4.0000 g | Freq: Four times a day (QID) | CUTANEOUS | 0 refills | Status: DC

## 2023-08-30 MED ORDER — DICLOFENAC SODIUM 1 % EX GEL
4.0000 g | Freq: Four times a day (QID) | CUTANEOUS | 0 refills | Status: DC
Start: 2023-08-30 — End: 2024-04-11

## 2023-08-30 NOTE — Patient Instructions (Signed)
Ms Quamme,   It was a pleasure to meet you.   For your back pain, we will get xrays today. I will call you as soon as we get the results.  For your knee pain, I am prescribing a topical pain medication that should help. I am also putting in a referral to physical therapy. I would also like to check a vitamin D level today.   Thanks,  Dr Carlynn Purl

## 2023-08-30 NOTE — Assessment & Plan Note (Addendum)
Patient was seen in urgent care earlier this month after a fall. She was using a ladder to retrieve boxes when she fell from the third or fourth rung, falling on her right arm and back. An xray of her arm showed no fracture. No workup was performed for her back pain, and since then she has had worsening pain that limits her mobility. Notably she has a history of osteoporosis. She denies red flag symptoms such as incontinence of bowel or bladder, saddle anesthesia, leg numbness or weakness. Exam is significant for tenderness to palpation of the midline lumbar and thoracic spine. Etiology is likely spinal compression fracture.  -Xray of lumbar and thoracic spine today -Recommend Tylenol and prn ibuprofen for pain

## 2023-08-30 NOTE — Assessment & Plan Note (Addendum)
Patient has a history of chronic bilateral knee pain, which has been worsening over the last few months. Activities such as walking up stairs or long walks have become very painful. Tylenol is somewhat effective in relieving pain. Patient's knees are mildly tender to medial and lateral palpation, no pain with active or passive flexion and extension. Etiology is likely chronic degenerative changes, and imaging will likely not change management.  -Recommend Tylenol, Voltaren gel, prn ibuprofen -Referral to physical therapy

## 2023-08-30 NOTE — Assessment & Plan Note (Addendum)
Last vitamin D level from 2022 was 24.5. Patient is on alendronate therapy.  -Vitamin D level ordered  Addendum: Vitamin D level resulted at 26.8. Vitamin D supplement ordered I called and discussed the results and plan with patient.

## 2023-08-30 NOTE — Progress Notes (Signed)
Subjective:  CC: Back pain, fall  HPI:  [name] is a 72 y.o. female with a past medical history stated below and presents today for above. Please see problem based assessment and plan for additional details.  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 12 tablet 1   albuterol (PROVENTIL) (2.5 MG/3ML) 0.083% nebulizer solution Take 3 mLs (2.5 mg total) by nebulization every 4 (four) hours as needed for wheezing or shortness of breath. (Patient not taking: Reported on 06/10/2023) 75 mL 2   alendronate (FOSAMAX) 70 MG tablet TAKE 1 TABLET BY MOUTH ONCE WEEKLY. TAKE WITH A FULL GLASS OF WATER ON EMPTY STOMACH 51 tablet 0   revefenacin (YUPELRI) 175 MCG/3ML nebulizer solution Take 3 mLs (175 mcg total) by nebulization daily. (Patient not taking: Reported on 06/10/2023) 90 mL 1   No current facility-administered medications on file prior to visit.    Review of Systems: ROS negative except for as is noted on the assessment and plan.  Objective:   Vitals:   08/30/23 1459  BP: 118/73  Pulse: 89  Temp: 98.1 F (36.7 C)  TempSrc: Oral   SpO2: 98%  Weight: 123 lb 1.6 oz (55.8 kg)  Height: 5\' 1"  (1.549 m)    Physical Exam: Constitutional: well-appearing, in no acute distress. Patient arises slowly from seated position, walks carefully HENT: normocephalic atraumatic, mucous membranes moist Eyes: conjunctiva non-erythematous Neck: supple Cardiovascular: regular rate and rhythm, no m/r/g Pulmonary/Chest: normal work of breathing on room air, lungs clear to auscultation bilaterally Abdominal: soft, non-tender, non-distended MSK: Spine with midline point tenderness to palpation of lumbar and thoracic areas. Straight leg test increases back pain without radiculopathy.  Neurological: alert & oriented x 3, 5/5 strength in bilateral upper and lower extremities, normal gait Skin: warm and dry  Assessment & Plan:   Acute midline low back pain Patient was seen in urgent care earlier this month after a fall. She was using a ladder to retrieve boxes when she fell from the third or fourth rung, falling on her right arm and back. An xray of her arm showed no fracture. No workup was performed for her back pain, and since then she has had worsening pain that limits her mobility. Notably she has a history of osteoporosis. She denies red flag symptoms such as incontinence of bowel or bladder, saddle anesthesia, leg numbness or weakness. Exam is significant for tenderness to palpation of the midline lumbar and thoracic spine. Etiology  is likely spinal compression fracture.  -Xray of lumbar and thoracic spine today -Recommend Tylenol and prn ibuprofen for pain  Osteoporosis Last vitamin D level from 2022 was 24.5. Patient is on alendronate therapy.  -Vitamin D level ordered  Bilateral knee pain Patient has a history of chronic bilateral knee pain, which has been worsening over the last few months. Activities such as walking up stairs or long walks have become very painful. Tylenol is somewhat effective in relieving pain. Patient's knees are  mildly tender to medial and lateral palpation, no pain with active or passive flexion and extension. Etiology is likely chronic degenerative changes, and imaging will likely not change management.  -Recommend Tylenol, Voltaren gel, prn ibuprofen -Referral to physical therapy     Patient seen with Dr. Assunta Gambles MD [facility]  [id number] Pager: [phone] Date 08/30/2023  Time 5:56 PM

## 2023-08-31 ENCOUNTER — Telehealth: Payer: Self-pay

## 2023-08-31 ENCOUNTER — Other Ambulatory Visit: Payer: Self-pay | Admitting: Internal Medicine

## 2023-08-31 LAB — VITAMIN D 25 HYDROXY (VIT D DEFICIENCY, FRACTURES): Vit D, 25-Hydroxy: 26.8 ng/mL — ABNORMAL LOW (ref 30.0–100.0)

## 2023-08-31 NOTE — Telephone Encounter (Signed)
Pt states she is requesting to speak with Dr. Carlynn Purl for lab results. Please call pt back.

## 2023-09-01 MED ORDER — VITAMIN D (ERGOCALCIFEROL) 1.25 MG (50000 UNIT) PO CAPS: 50000.0000 [IU] | ORAL_CAPSULE | ORAL | 0 refills | Status: DC

## 2023-09-01 NOTE — Addendum Note (Signed)
Addended by: Monna Fam on: 09/01/2023 01:15 PM   Modules accepted: Orders

## 2023-09-05 DIAGNOSIS — M810 Age-related osteoporosis without current pathological fracture: Secondary | ICD-10-CM | POA: Diagnosis not present

## 2023-09-05 DIAGNOSIS — M8588 Other specified disorders of bone density and structure, other site: Secondary | ICD-10-CM | POA: Diagnosis not present

## 2023-09-05 DIAGNOSIS — R29890 Loss of height: Secondary | ICD-10-CM | POA: Diagnosis not present

## 2023-09-06 NOTE — Progress Notes (Signed)
Internal Medicine Clinic Attending  I was physically present during the key portions of the resident provided service and participated in the medical decision making of patient's management care. I reviewed pertinent patient test results.  The assessment, diagnosis, and plan were formulated together and I agree with the documentation in the resident's note.  Mercie Eon, MD   I share Dr Carlynn Purl' concern for a possible compression fracture, and agree with spinal imaging

## 2023-09-07 ENCOUNTER — Other Ambulatory Visit: Payer: Self-pay

## 2023-09-07 ENCOUNTER — Encounter: Payer: Self-pay | Admitting: *Deleted

## 2023-09-07 ENCOUNTER — Ambulatory Visit: Payer: 59

## 2023-09-07 ENCOUNTER — Ambulatory Visit
Admission: EM | Admit: 2023-09-07 | Discharge: 2023-09-07 | Disposition: A | Discharge status: Home / Self Care | Payer: 59 | Attending: Emergency Medicine | Admitting: Emergency Medicine

## 2023-09-07 DIAGNOSIS — M25561 Pain in right knee: Secondary | ICD-10-CM | POA: Diagnosis not present

## 2023-09-07 DIAGNOSIS — S8262XA Displaced fracture of lateral malleolus of left fibula, initial encounter for closed fracture: Secondary | ICD-10-CM | POA: Diagnosis not present

## 2023-09-07 DIAGNOSIS — M1711 Unilateral primary osteoarthritis, right knee: Secondary | ICD-10-CM | POA: Diagnosis not present

## 2023-09-07 DIAGNOSIS — M25572 Pain in left ankle and joints of left foot: Secondary | ICD-10-CM

## 2023-09-07 DIAGNOSIS — S93402A Sprain of unspecified ligament of left ankle, initial encounter: Secondary | ICD-10-CM

## 2023-09-07 DIAGNOSIS — S81811A Laceration without foreign body, right lower leg, initial encounter: Secondary | ICD-10-CM

## 2023-09-07 DIAGNOSIS — S8001XA Contusion of right knee, initial encounter: Secondary | ICD-10-CM | POA: Diagnosis not present

## 2023-09-07 DIAGNOSIS — S82425A Nondisplaced transverse fracture of shaft of left fibula, initial encounter for closed fracture: Secondary | ICD-10-CM | POA: Diagnosis not present

## 2023-09-07 DIAGNOSIS — S8292XA Unspecified fracture of left lower leg, initial encounter for closed fracture: Secondary | ICD-10-CM | POA: Diagnosis not present

## 2023-09-07 NOTE — ED Provider Notes (Addendum)
[facility]    CSN: [id number] Arrival date & time: 09/07/23  1521      History   Chief Complaint Chief Complaint  Patient presents with   Fall    HPI Deborah Arroyo is a 72 y.o. female.   Patient presents with concerns of left ankle pain and swelling as well as right knee wound and pain after a fall earlier today. She states she stepped wrong off a curb, twisting her left ankle and then falling to her right side landing on her right knee. She has a wound on the front of her right knee that she states has still been oozing blood. The patient denies hitting her head, dizziness, or syncope. She reports pain in the right knee mainly with full extension, but her primary discomfort is in her left ankle. The patient reports minimal weightbearing or movement due to pain. She states the pain is constant but worse when trying to walk. She used a cane and leaning on a family member to ambulate into the clinic. She denies known prior injury such as fracture to the left ankle. The patient does report some tingling into the left foot but otherwise denies numbness. She has not taken anything for pain or applied ice. She did bandage her knee.   The history is provided by the patient.  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   Hypoglycemia    Hyponatremia    syncope with this and admitted twice due to this issue    Osteopenia    Osteoporosis    Rectal bleeding    Rectal cancer s/p LAR/diverting loop ileostomy 07/12/2018 03/06/2018   Smoker     Patient Active Problem List   Diagnosis Date Noted    Acute midline low back pain 08/30/2023   Hematuria 09/20/2022   Housing situation unstable 10/14/2021   Polyarthralgia 09/17/2021   Major depressive disorder 08/20/2021   Emphysema/COPD (HCC) 08/20/2021   GAD (generalized anxiety disorder) 02/18/2020   Fecal incontinence with diarrhea 04/22/2019   S/P closure of ileostomy 04/20/2019   Rectal cancer s/p LAR/diverting loop ileostomy 07/12/2018 03/06/2018   Osteoporosis 12/15/2017   Rectal mass 08/25/2017   Bilateral knee pain 08/25/2017   Tobacco abuse 09/04/2015    Past Surgical History:  Procedure Laterality Date   ABDOMINAL HYSTERECTOMY     APPENDECTOMY     BIOPSY  07/04/2018   Procedure: BIOPSY;  Surgeon: Beverley Fiedler, MD;  Location: [facility];  Service: Gastroenterology;;   CHOLECYSTECTOMY     COLONOSCOPY  03/01/2018   Dr.Pyrtle   COLONOSCOPY WITH PROPOFOL N/A 07/04/2018   Procedure: COLONOSCOPY WITH PROPOFOL;  Surgeon: Beverley Fiedler, MD;  Location: [facility];  Service: Gastroenterology;  Laterality: N/A;   CYSTOSCOPY WITH STENT PLACEMENT Bilateral 07/12/2018   Procedure: CYSTOSCOPY WITH STENT PLACEMENT;  Surgeon: Crista Elliot, MD;  Location: [facility];  Service: Urology;  Laterality: Bilateral;   DIVERTING ILEOSTOMY N/A 07/12/2018   Procedure: DIVERTING LOOP ILEOSTOMY;  Surgeon: Marin Olp  M, MD;  Location: [facility];  Service: General;  Laterality: N/A;   EYE SURGERY     FLEXIBLE SIGMOIDOSCOPY N/A 07/12/2018   Procedure: FLEXIBLE SIGMOIDOSCOPY;  Surgeon: Andria Meuse, MD;  Location: [facility];  Service: General;  Laterality: N/A;   FLEXIBLE SIGMOIDOSCOPY N/A 12/04/2018   Procedure: FLEXIBLE SIGMOIDOSCOPY;  Surgeon: Andria Meuse, MD;  Location: [facility];  Service: General;  Laterality: N/A;   ILEO LOOP COLOSTOMY CLOSURE N/A 04/20/2019   Procedure: OPEN TAKEDOWN OF ILEOSTOMY, primary repair of parastomal hernia;  Surgeon: Andria Meuse, MD;  Location: [facility];  Service: General;  Laterality: N/A;    LAPAROSCOPIC LOW ANTERIOR RESECTION N/A 07/12/2018   Procedure: LAPAROSCOPIC LOW ANTERIOR RESECTION WITH COLOPROSTOSTOMY, ERAS PATHWAY;  Surgeon: Andria Meuse, MD;  Location: [facility];  Service: General;  Laterality: N/A;   POLYPECTOMY  07/04/2018   Procedure: POLYPECTOMY;  Surgeon: Beverley Fiedler, MD;  Location: [facility];  Service: Gastroenterology;;   SHOULDER SURGERY Left    Rotary Cuff Tear   TUBAL LIGATION      OB History     Gravida  4   Para  3   Term  3   Preterm      AB  1   Living  3      SAB      IAB  1   Ectopic      Multiple      Live Births               Home Medications    Prior to Admission medications   Medication Sig Start Date End Date Taking? Authorizing Provider  acetaminophen (TYLENOL) 325 MG tablet Take 2 tablets (650 mg total) by mouth every 6 (six) hours as needed for mild pain, fever or headache (or Fever >/= 101). 06/21/19   Shon Hale, MD  albuterol (PROVENTIL) (2.5 MG/3ML) 0.083% nebulizer solution Take 3 mLs (2.5 mg total) by nebulization every 4 (four) hours as needed for wheezing or shortness of breath. Patient not taking: Reported on 06/10/2023 09/20/22 09/20/23  Champ Mungo, DO  alendronate (FOSAMAX) 70 MG tablet TAKE 1 TABLET BY MOUTH ONCE WEEKLY. TAKE WITH A FULL GLASS OF WATER ON EMPTY STOMACH 05/18/23   Champ Mungo, DO  diclofenac Sodium (VOLTAREN) 1 % GEL Apply 4 g topically 4 (four) times daily. 08/30/23   Monna Fam, MD  revefenacin [facility]) 175 MCG/3ML nebulizer solution Take 3 mLs (175 mcg total) by nebulization daily. Patient not taking: Reported on 06/10/2023 09/20/22   Champ Mungo, DO  Vitamin D, Ergocalciferol, (DRISDOL) 1.25 MG (50000 UNIT) CAPS capsule Take 1 capsule (50,000 Units total) by mouth every 7 (seven) days. 09/01/23   Monna Fam, MD    Family History Family History  Problem Relation Age of Onset   Cancer Mother        Uterus   Dementia Mother    Uterine cancer Mother    Endometrial  cancer Mother    Alzheimer's disease Father    Stomach cancer Sister    Ovarian cancer Sister    Cancer - Ovarian Sister    Diabetes Sister    Asthma Sister    Prostate cancer Brother    Cancer - Prostate Brother    Cancer Brother        lung/brain   Breast cancer Daughter    Cancer Other    Colon cancer Neg Hx    Esophageal cancer Neg Hx    Pancreatic cancer  Neg Hx    Rectal cancer Neg Hx    Colon polyps Neg Hx     Social History Social History   Tobacco Use   Smoking status: Every Day    Current packs/day: 1.50    Types: Cigarettes   Smokeless tobacco: Never   Tobacco comments:    1.5 PPD  Vaping Use   Vaping status: Never Used  Substance Use Topics   Alcohol use: No    Alcohol/week: 0.0 standard drinks of alcohol   Drug use: No     Allergies   Aleve [naproxen sodium]   Review of Systems Review of Systems  Constitutional:  Negative for fever.  Gastrointestinal:  Negative for nausea and vomiting.  Musculoskeletal:  Positive for arthralgias, gait problem and joint swelling.  Skin:  Positive for wound.  Neurological:  Negative for dizziness, syncope and numbness.     Physical Exam Triage Vital Signs ED Triage Vitals  Encounter Vitals Group     BP 09/07/23 1657 (!) 147/80     Systolic BP Percentile --      Diastolic BP Percentile --      Pulse Rate 09/07/23 1657 88     Resp 09/07/23 1657 18     Temp 09/07/23 1657 97.7 F (36.5 C)     Temp Source 09/07/23 1657 Oral     SpO2 09/07/23 1657 93 %     Weight --      Height --      Head Circumference --      Peak Flow --      Pain Score 09/07/23 1658 8     Pain Loc --      Pain Education --      Exclude from Growth Chart --    No data found.  Updated Vital Signs BP (!) 147/80 (BP Location: Left Arm)   Pulse 88   Temp 97.7 F (36.5 C) (Oral)   Resp 18   SpO2 93%   Visual Acuity Right Eye Distance:   Left Eye Distance:   Bilateral Distance:    Right Eye Near:   Left Eye Near:     Bilateral Near:     Physical Exam Vitals and nursing note reviewed.  Constitutional:      General: She is not in acute distress. Cardiovascular:     Pulses: Normal pulses.  Pulmonary:     Effort: Pulmonary effort is normal.  Musculoskeletal:     Right knee: Laceration present. Normal range of motion. Tenderness present.     Left ankle: Swelling present. No lacerations. Tenderness present. Decreased range of motion. Normal pulse.     Comments: Notable swelling to left lateral ankle. Tenderness to left lateral ankle primarily over malleolus and to inferior soft tissue. Some tenderness into heel. Denies tenderness to medial, posterior, or anterior ankle or into foot. Decreased ankle ROM due to pain.  Right anterior knee with skin tear. Tenderness to lateral patellar border and some into lateral knee soft tissue. No tenderness to medial, superior, or inferior patellar borders. Tenderness to body of patella due to overlying laceration. Full ROM but reports discomfort primarily with full extension.   Skin:    Findings: Laceration present.     Comments: Skin tear/L-shaped flap to right anterior knee, approx 2x1cm. Scant bleeding.   Neurological:     Mental Status: She is alert.     Gait: Gait abnormal (antalgic favoring left, applying minimal weight into left foot).      UC  Treatments / Results  Labs (all labs ordered are listed, but only abnormal results are displayed) Labs Reviewed - No data to display  EKG   Radiology DG Ankle Complete Left  Result Date: 09/07/2023 CLINICAL DATA:  Left lateral ankle pain and swelling after twisting status fall injury. EXAM: LEFT ANKLE COMPLETE - 3+ VIEW COMPARISON:  None Available. FINDINGS: The ankle mortise is symmetric and intact. Moderate lateral and mild anterior ankle soft tissue swelling. There is a 3 mm ossicle just distal to the fibula with proximal likely non corticated border suspicious for a tiny avulsion injury. Additional  age-indeterminate 2 mm ossicle at the distal fibular-lateral talar interval on frontal view, an additional age indeterminate avulsion injury. On oblique view there is horizontal linear lucency within the distal fibula 3 mm from the tip, a nondisplaced transverse fracture. IMPRESSION: 1. Nondisplaced transverse fracture of the distal fibula. 2. Tiny ossicle just distal to the fibula with proximal likely non corticated border suspicious for a tiny avulsion injury. Additional age-indeterminate 2 mm ossicle at the distal fibular-lateral talar interval on frontal view, an additional age indeterminate avulsion injury. Electronically Signed   By: Neita Garnet M.D.   On: 09/07/2023 18:43   DG Knee AP/LAT W/Sunrise Right  Result Date: 09/07/2023 CLINICAL DATA:  Twisting injury. Right knee pain after fall directly onto patella. EXAM: RIGHT KNEE 3 VIEWS COMPARISON:  None Available. FINDINGS: Minimal medial compartment joint space narrowing. Minimal patellofemoral joint space narrowing and mild superior and lateral degenerative osteophytes. No joint effusion. No acute fracture or dislocation. IMPRESSION: Minimal medial and patellofemoral compartment osteoarthritis. No acute fracture. Electronically Signed   By: Neita Garnet M.D.   On: 09/07/2023 18:36    Procedures Procedures (including critical care time)  Medications Ordered in UC Medications - No data to display  Initial Impression / Assessment and Plan / UC Course  I have reviewed the triage vital signs and the nursing notes.  Pertinent labs & imaging results that were available during my care of the patient were reviewed by me and considered in my medical decision making (see chart for details).     My interpretation of x-rays (discharged pt while awaiting radiology read): normal right knee; left distal lateral malleolus avulsion fracture. Ortho boot and crutches and instructed to follow-up with orthopedics. Discussed wound care for right knee. No  evidence of neurovascular injury.   E/M: 1 acute uncomplicated illness, no data, moderate risk due to fracture management   Final Clinical Impressions(s) / UC Diagnoses   Final diagnoses:  Closed fracture of distal lateral malleolus of ankle, left, initial encounter  Contusion of right knee, initial encounter  Sprain of left ankle, unspecified ligament, initial encounter  Skin tear of right lower leg without complication, initial encounter     Discharge Instructions      Fracture of your left ankle seen on x-rays. Wear boot provided - you may weight bear if tolerable while wearing the boot, but limit to only necessary. Rest, ice, and elevate. Take OTC pain medication as directed for pain. Wear the boot until seen by orthopedics. Follow-up with orthopedics for further management.  No fracture seen of your right knee, if radiologist read differs we will contact you. Keep the wound clean, dry, and covered. Return to care if develop increasing pain, redness, swelling, or purulent drainage.      ED Prescriptions   None    PDMP not reviewed this encounter.   Estanislado Pandy, [place] 09/07/23 1758  Estanislado Pandy, [place] 09/07/23 1927

## 2023-09-07 NOTE — Discharge Instructions (Addendum)
Fracture of your left ankle seen on x-rays. Wear boot provided - you may weight bear if tolerable while wearing the boot, but limit to only necessary. Rest, ice, and elevate. Take OTC pain medication as directed for pain. Wear the boot until seen by orthopedics. Follow-up with orthopedics for further management.  No fracture seen of your right knee, if radiologist read differs we will contact you. Keep the wound clean, dry, and covered. Return to care if develop increasing pain, redness, swelling, or purulent drainage.
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
## 2023-09-08 ENCOUNTER — Telehealth: Payer: Self-pay | Admitting: Internal Medicine

## 2023-09-08 NOTE — Telephone Encounter (Signed)
Pt is requesting a call back about her X ray results completed on 08/30/2023.

## 2023-09-12 DIAGNOSIS — S8262XA Displaced fracture of lateral malleolus of left fibula, initial encounter for closed fracture: Secondary | ICD-10-CM | POA: Diagnosis not present

## 2023-09-12 DIAGNOSIS — M25561 Pain in right knee: Secondary | ICD-10-CM | POA: Diagnosis not present

## 2023-10-04 ENCOUNTER — Ambulatory Visit: Payer: 59

## 2023-10-04 DIAGNOSIS — Z0000 Encounter for general adult medical examination without abnormal findings: Secondary | ICD-10-CM

## 2023-10-04 NOTE — Patient Instructions (Signed)

## 2023-10-04 NOTE — Progress Notes (Signed)
Subjective:   Deborah Arroyo is a 72 y.o. female who presents for Medicare Annual (Subsequent) preventive examination.  Visit Complete: Virtual I connected with  Deborah Arroyo on 10/04/23 by a audio enabled telemedicine application and verified that I am speaking with the correct person using two identifiers.  Patient Location: Home  Provider Location: Office/Clinic  I discussed the limitations of evaluation and management by telemedicine. The patient expressed understanding and agreed to proceed.  Vital Signs: Because this visit was a virtual/telehealth visit, some criteria may be missing or patient reported. Any vitals not documented were not able to be obtained and vitals that have been documented are patient reported.  Cardiac Risk Factors include: advanced age (>79men, >18 women);smoking/ tobacco exposure     Objective:    There were no vitals filed for this visit. There is no height or weight on file to calculate BMI.     10/04/2023    1:20 PM 08/30/2023    2:57 PM 04/12/2023   12:14 PM 09/20/2022    2:12 PM 05/11/2022    1:41 PM 04/02/2022   10:03 AM 10/13/2021    2:22 PM  Advanced Directives  Does Patient Have a Medical Advance Directive? No No No No No No No  Would patient like information on creating a medical advance directive? No - Patient declined No - Patient declined  No - Patient declined No - Patient declined No - Patient declined No - Patient declined    Current Medications (verified) Outpatient Encounter Medications as of 10/04/2023  Medication Sig   acetaminophen (TYLENOL) 325 MG tablet Take 2 tablets (650 mg total) by mouth every 6 (six) hours as needed for mild pain, fever or headache (or Fever >/= 101).   alendronate (FOSAMAX) 70 MG tablet TAKE 1 TABLET BY MOUTH ONCE WEEKLY. TAKE WITH A FULL GLASS OF WATER ON EMPTY STOMACH   diclofenac Sodium (VOLTAREN) 1 % GEL Apply 4 g topically 4 (four) times daily.   revefenacin (YUPELRI) 175 MCG/3ML nebulizer  solution Take 3 mLs (175 mcg total) by nebulization daily.   Vitamin D, Ergocalciferol, (DRISDOL) 1.25 MG (50000 UNIT) CAPS capsule Take 1 capsule (50,000 Units total) by mouth every 7 (seven) days.   albuterol (PROVENTIL) (2.5 MG/3ML) 0.083% nebulizer solution Take 3 mLs (2.5 mg total) by nebulization every 4 (four) hours as needed for wheezing or shortness of breath. (Patient not taking: Reported on 06/10/2023)   No facility-administered encounter medications on file as of 10/04/2023.    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   Hypoglycemia    Hyponatremia    syncope with this and admitted twice due to this issue    Osteopenia    Osteoporosis    Rectal bleeding    Rectal cancer s/p LAR/diverting loop ileostomy 07/12/2018 03/06/2018   Smoker    Past Surgical History:  Procedure Laterality Date   ABDOMINAL HYSTERECTOMY     APPENDECTOMY     BIOPSY  07/04/2018  Procedure: BIOPSY;  Surgeon: Beverley Fiedler, MD;  Location: [facility];  Service: Gastroenterology;;   CHOLECYSTECTOMY     COLONOSCOPY  03/01/2018   Dr.Pyrtle   COLONOSCOPY WITH PROPOFOL N/A 07/04/2018   Procedure: COLONOSCOPY WITH PROPOFOL;  Surgeon: Beverley Fiedler, MD;  Location: [facility];  Service: Gastroenterology;  Laterality: N/A;   CYSTOSCOPY WITH STENT PLACEMENT Bilateral 07/12/2018   Procedure: CYSTOSCOPY WITH STENT PLACEMENT;  Surgeon: Crista Elliot, MD;  Location: [facility];  Service: Urology;  Laterality: Bilateral;   DIVERTING ILEOSTOMY N/A 07/12/2018   Procedure: DIVERTING LOOP  ILEOSTOMY;  Surgeon: Andria Meuse, MD;  Location: [facility];  Service: General;  Laterality: N/A;   EYE SURGERY     FLEXIBLE SIGMOIDOSCOPY N/A 07/12/2018   Procedure: FLEXIBLE SIGMOIDOSCOPY;  Surgeon: Andria Meuse, MD;  Location: [facility];  Service: General;  Laterality: N/A;   FLEXIBLE SIGMOIDOSCOPY N/A 12/04/2018   Procedure: FLEXIBLE SIGMOIDOSCOPY;  Surgeon: Andria Meuse, MD;  Location: [facility];  Service: General;  Laterality: N/A;   ILEO LOOP COLOSTOMY CLOSURE N/A 04/20/2019   Procedure: OPEN TAKEDOWN OF ILEOSTOMY, primary repair of parastomal hernia;  Surgeon: Andria Meuse, MD;  Location: [facility];  Service: General;  Laterality: N/A;   LAPAROSCOPIC LOW ANTERIOR RESECTION N/A 07/12/2018   Procedure: LAPAROSCOPIC LOW ANTERIOR RESECTION WITH COLOPROSTOSTOMY, ERAS PATHWAY;  Surgeon: Andria Meuse, MD;  Location: [facility];  Service: General;  Laterality: N/A;   POLYPECTOMY  07/04/2018   Procedure: POLYPECTOMY;  Surgeon: Beverley Fiedler, MD;  Location: [facility];  Service: Gastroenterology;;   SHOULDER SURGERY Left    Rotary Cuff Tear   TUBAL LIGATION     Family History  Problem Relation Age of Onset   Cancer Mother        Uterus   Dementia Mother    Uterine cancer Mother    Endometrial cancer Mother    Alzheimer's disease Father    Stomach cancer Sister    Ovarian cancer Sister    Cancer - Ovarian Sister    Diabetes Sister    Asthma Sister    Prostate cancer Brother    Cancer - Prostate Brother    Cancer Brother        lung/brain   Breast cancer Daughter    Cancer Other    Colon cancer Neg Hx    Esophageal cancer Neg Hx    Pancreatic cancer Neg Hx    Rectal cancer Neg Hx    Colon polyps Neg Hx    Social History   Socioeconomic History   Marital status: Divorced    Spouse name: Not on file   Number of children: 3   Years of education: Not on file   Highest education level: 8th grade  Occupational History   Occupation: Retired     Associate Professor: [employer]  Tobacco Use   Smoking status: Every Day    Current packs/day: 1.50    Types: Cigarettes   Smokeless tobacco: Never   Tobacco comments:    1.5 PPD  Vaping Use   Vaping status: Never Used  Substance and Sexual Activity   Alcohol use: No    Alcohol/week: 0.0 standard drinks of alcohol   Drug use: No   Sexual activity: Not Currently  Other Topics Concern   Not on file  Social History Narrative   Divorced, lives with 2 sons and 1 grandchild, I dog. She drinks 3-4 cups of coffee a day. Is very active around the  house. Has 3 brothers and 6 sisters.      Current Social History 07/14/21       Patient lives with 2 sons and 1 grandson in a two level home. There are 2 steps without handrails up to the entrance the patient uses.       Patient's method of transportation is via friend's car at present.      The highest level of education was 8th grade      The patient currently retired from [employer].      Identified important Relationships are: "my kids, my grandsons, and my sister."      Pets : A miniature shepherd named CJ       Interests / Fun: "bingo once in a blue moon"      Current Stressors: "living conditions"      Religious / Personal Beliefs: None    Social Determinants of Health   Financial Resource Strain: High Risk (10/04/2023)   Overall Financial Resource Strain (CARDIA)    Difficulty of Paying Living Expenses: Hard  Food Insecurity: No Food Insecurity (10/04/2023)   Hunger Vital Sign    Worried About Running Out of Food in the Last Year: Never true    Ran Out of Food in the Last Year: Never true  Transportation Needs: No Transportation Needs (10/04/2023)   PRAPARE - Administrator, Civil Service (Medical): No    Lack of Transportation (Non-Medical): No  Physical Activity: Insufficiently Active (10/04/2023)   Exercise Vital Sign    Days of Exercise per Week: 3 days    Minutes of Exercise per Session: 20 min  Stress: Stress  Concern Present (10/04/2023)   [employer] of Occupational Health - Occupational Stress Questionnaire    Feeling of Stress : Very much  Social Connections: Socially Isolated (10/04/2023)   Social Connection and Isolation Panel [NHANES]    Frequency of Communication with Friends and Family: More than three times a week    Frequency of Social Gatherings with Friends and Family: More than three times a week    Attends Religious Services: Never    Database administrator or Organizations: No    Attends Engineer, structural: Never    Marital Status: Divorced    Tobacco Counseling Ready to quit: Not Answered Counseling given: Not Answered Tobacco comments: 1.5 PPD   Clinical Intake:  Pre-visit preparation completed: Yes  Pain : 0-10     Diabetes: No  How often do you need to have someone help you when you read instructions, pamphlets, or other written materials from your doctor or pharmacy?: 1 - Never What is the last grade level you completed in school?: 8 th grade  Interpreter Needed?: No  Information entered by :: Oshay Stranahan   Activities of Daily Living    10/04/2023    1:14 PM 08/30/2023    2:59 PM  In your present state of health, do you have any difficulty performing the following activities:  Hearing? 1 1  Vision? 1 1  Difficulty concentrating or making decisions? 0 0  Walking or climbing stairs? 1 1  Dressing or bathing? 0 0  Doing errands, shopping? 0 0  Preparing Food and eating ? N   Using the Toilet? N   In the past six months, have you accidently leaked urine? N   Do you have problems with loss of bowel control? Y   Managing your Medications? N   Managing your Finances? N  Housekeeping or managing your Housekeeping? N     Patient Care Team: Champ Mungo, DO as PCP - General Cliffton Asters Stephanie Coup, MD as Consulting Physician (Colon and Rectal Surgery) Ladene Artist, MD as Consulting Physician (Oncology) Pyrtle, Carie Caddy, MD as  Consulting Physician (Gastroenterology)  Indicate any recent Medical Services you may have received from other than [facility] providers in the past year (date may be approximate).     Assessment:   This is a routine wellness examination for Zahlia.  Hearing/Vision screen No results found.   Goals Addressed   None   Depression Screen    10/04/2023    1:17 PM 08/30/2023    2:58 PM 09/20/2022    2:12 PM 05/31/2022    4:38 PM 04/02/2022   10:04 AM 10/13/2021    3:41 PM 09/16/2021    2:51 PM  PHQ 2/9 Scores  PHQ - 2 Score 2 1 1 2 2 1  0  PHQ- 9 Score 7 6 7 10 12 9 9     Fall Risk    10/04/2023    1:21 PM 08/30/2023    2:57 PM 09/20/2022    2:12 PM 05/31/2022    2:13 PM 04/02/2022   10:04 AM  Fall Risk   Falls in the past year? 1 1 0 1 1  Number falls in past yr: 1 1  0 0  Injury with Fall? 1 1  0 0  Risk for fall due to : Impaired balance/gait No Fall Risks No Fall Risks No Fall Risks   Risk for fall due to: Comment  fell from ladder.     Follow up Falls prevention discussed;Falls evaluation completed Falls evaluation completed Falls evaluation completed Falls evaluation completed     MEDICARE RISK AT HOME: Medicare Risk at Home Any stairs in or around the home?: Yes If so, are there any without handrails?: No Home free of loose throw rugs in walkways, pet beds, electrical cords, etc?: Yes Adequate lighting in your home to reduce risk of falls?: Yes Life alert?: No Use of a cane, walker or w/c?: No Grab bars in the bathroom?: Yes Shower chair or bench in shower?: No Elevated toilet seat or a handicapped toilet?: No  TIMED UP AND GO:  Was the test performed?  No    Cognitive Function:        10/04/2023    1:21 PM 07/14/2021    1:58 PM  6CIT Screen  What Year? 0 points 0 points  What month? 0 points 0 points  What time? 0 points 0 points  Count back from 20 0 points 0 points  Months in reverse 0 points 0 points  Repeat phrase 0 points 0 points  Total Score 0 points  0 points    Immunizations Immunization History  Administered Date(s) Administered   PFIZER(Purple Top)SARS-COV-2 Vaccination 01/14/2020, 02/02/2020, 11/10/2020   Tdap 08/10/2023    TDAP status: Up to date  Flu Vaccine status: Declined, Education has been provided regarding the importance of this vaccine but patient still declined. Advised may receive this vaccine at local pharmacy or Health Dept. Aware to provide a copy of the vaccination record if obtained from local pharmacy or Health Dept. Verbalized acceptance and understanding.  Pneumococcal vaccine status: Declined,  Education has been provided regarding the importance of this vaccine but patient still declined. Advised may receive this vaccine at local pharmacy or Health Dept. Aware to provide a copy of the vaccination record if obtained from local pharmacy or  Health Dept. Verbalized acceptance and understanding.   Covid-19 vaccine status: Completed vaccines  Qualifies for Shingles Vaccine? No   Zostavax completed No   Shingrix Completed?: No.    Education has been provided regarding the importance of this vaccine. Patient has been advised to call insurance company to determine out of pocket expense if they have not yet received this vaccine. Advised may also receive vaccine at local pharmacy or Health Dept. Verbalized acceptance and understanding.  Screening Tests Health Maintenance  Topic Date Due   Pneumonia Vaccine 1+ Years old (1 of 2 - PCV) Never done   Zoster Vaccines- Shingrix (1 of 2) Never done   Colonoscopy  07/18/2022   MAMMOGRAM  05/28/2023   INFLUENZA VACCINE  Never done   COVID-19 Vaccine (4 - 2023-24 season) 07/03/2023   Medicare Annual Wellness (AWV)  10/03/2024   DTaP/Tdap/Td (2 - Td or Tdap) 08/09/2033   DEXA SCAN  Completed   Hepatitis C Screening  Completed   HPV VACCINES  Aged Out    Health Maintenance  Health Maintenance Due  Topic Date Due   Pneumonia Vaccine 68+ Years old (1 of 2 - PCV) Never  done   Zoster Vaccines- Shingrix (1 of 2) Never done   Colonoscopy  07/18/2022   MAMMOGRAM  05/28/2023   INFLUENZA VACCINE  Never done   COVID-19 Vaccine (4 - 2023-24 season) 07/03/2023    Colorectal cancer screening: Type of screening: Colonoscopy. Completed 2. Repeat every 3 years  Mammogram status: Completed 08/01/2023. Repeat every year  Bone Density status: Completed 08/02/2023. Results reflect: Bone density results: OSTEOPOROSIS. Repeat every 1 years.  Lung Cancer Screening: (Low Dose CT Chest recommended if Age 77-80 years, 20 pack-year currently smoking OR have quit w/in 15years.) does not qualify.   Lung Cancer Screening Referral: DEFERRED TO PCP   Additional Screening:  Hepatitis C Screening: does qualify; Completed 12/14/2017  Vision Screening: Recommended annual ophthalmology exams for early detection of glaucoma and other disorders of the eye. Is the patient up to date with their annual eye exam?  No  Who is the provider or what is the name of the office in which the patient attends annual eye exams? [facility]  If pt is not established with a provider, would they like to be referred to a provider to establish care? No .   Dental Screening: Recommended annual dental exams for proper oral hygiene  Diabetic Foot Exam: N/A  Community Resource Referral / Chronic Care Management: CRR required this visit?  No   CCM required this visit?  No     Plan:     I have personally reviewed and noted the following in the patient's chart:   Medical and social history Use of alcohol, tobacco or illicit drugs  Current medications and supplements including opioid prescriptions. Patient is not currently taking opioid prescriptions. Functional ability and status Nutritional status Physical activity Advanced directives List of other physicians Hospitalizations, surgeries, and ER visits in previous 12 months Vitals Screenings to include cognitive, depression, and  falls Referrals and appointments  In addition, I have reviewed and discussed with patient certain preventive protocols, quality metrics, and best practice recommendations. A written personalized care plan for preventive services as well as general preventive health recommendations were provided to patient.     Derrell Lolling, CMA   10/04/2023   After Visit Summary: (Mail) Due to this being a telephonic visit, the after visit summary with patients personalized plan was offered to patient  via mail   Nurse Notes: NON FACE TO FACE    Ms. Marlett , Thank you for taking time to come for your Medicare Wellness Visit. I appreciate your ongoing commitment to your health goals. Please review the following plan we discussed and let me know if I can assist you in the future.   These are the goals we discussed:  Goals       Exercise 1x per week      Begin seated and standing exercises with exercise band to increase strength and balance.       MSW Care Coordination (pt-stated)      Patient needs are all currently being met.  Patient is working on getting her vehicle repaired.  SW completed SDOH screening. No needs or barriers present.          This is a list of the screening recommended for you and due dates:  Health Maintenance  Topic Date Due   Pneumonia Vaccine (1 of 2 - PCV) Never done   Zoster (Shingles) Vaccine (1 of 2) Never done   Colon Cancer Screening  07/18/2022   Mammogram  05/28/2023   Flu Shot  Never done   COVID-19 Vaccine (4 - 2023-24 season) 07/03/2023   Medicare Annual Wellness Visit  10/03/2024   DTaP/Tdap/Td vaccine (2 - Td or Tdap) 08/09/2033   DEXA scan (bone density measurement)  Completed   Hepatitis C Screening  Completed   HPV Vaccine  Aged Out

## 2023-10-13 NOTE — Progress Notes (Signed)
Internal Medicine Clinic Attending  Case, documentation, and findings reviewed. I agree with the assessment, diagnosis, and plan of care as outlined in the AWV note.     

## 2023-10-19 ENCOUNTER — Ambulatory Visit
Admission: EM | Admit: 2023-10-19 | Discharge: 2023-10-19 | Disposition: A | Discharge status: Home / Self Care | Payer: 59 | Attending: Internal Medicine | Admitting: Internal Medicine

## 2023-10-19 ENCOUNTER — Ambulatory Visit (INDEPENDENT_AMBULATORY_CARE_PROVIDER_SITE_OTHER): Payer: 59

## 2023-10-19 VITALS — BP 157/83 | HR 85 | Temp 98.4°F | Resp 16 | Ht 61.0 in | Wt 120.0 lb

## 2023-10-19 DIAGNOSIS — M25572 Pain in left ankle and joints of left foot: Secondary | ICD-10-CM

## 2023-10-19 DIAGNOSIS — G8929 Other chronic pain: Secondary | ICD-10-CM

## 2023-10-19 DIAGNOSIS — S8262XD Displaced fracture of lateral malleolus of left fibula, subsequent encounter for closed fracture with routine healing: Secondary | ICD-10-CM | POA: Diagnosis not present

## 2023-10-19 NOTE — ED Triage Notes (Signed)
Patient presents with left ankle pain, still having soreness. Patient presents Ibuprofen with a little relief.

## 2023-10-19 NOTE — ED Provider Notes (Signed)
[facility]    CSN: [id number] Arrival date & time: 10/19/23  1057      History   Chief Complaint No chief complaint on file.   HPI Deborah Arroyo is a 72 y.o. female.   The history is provided by the patient.  Had a left ankle fracture September 07, 2023 was seen here had an x-ray was treated with a boot.  States she continues to have pain in the ankle and feels it did not heal well.  She did not follow-up with an orthopedist.  Wears the boot occasionally.  Denies swelling, paresthesias.  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   Hypoglycemia    Hyponatremia    syncope with this and admitted twice due to this issue    Osteopenia    Osteoporosis    Rectal bleeding    Rectal cancer s/p LAR/diverting loop ileostomy 07/12/2018 03/06/2018   Smoker     Patient Active Problem List   Diagnosis Date Noted   Acute midline low back pain 08/30/2023   Hematuria 09/20/2022   Housing situation unstable 10/14/2021   Polyarthralgia 09/17/2021   Major depressive disorder 08/20/2021   Emphysema/COPD (HCC) 08/20/2021   GAD (generalized anxiety disorder) 02/18/2020   Fecal incontinence with diarrhea 04/22/2019   S/P closure of ileostomy 04/20/2019   Rectal cancer s/p LAR/diverting loop ileostomy 07/12/2018 03/06/2018   Osteoporosis 12/15/2017   Rectal mass 08/25/2017   Bilateral knee pain 08/25/2017   Tobacco abuse 09/04/2015    Past Surgical History:  Procedure Laterality Date   ABDOMINAL HYSTERECTOMY     APPENDECTOMY     BIOPSY  07/04/2018   Procedure: BIOPSY;  Surgeon: Beverley Fiedler, MD;   Location: [facility];  Service: Gastroenterology;;   CHOLECYSTECTOMY     COLONOSCOPY  03/01/2018   Dr.Pyrtle   COLONOSCOPY WITH PROPOFOL N/A 07/04/2018   Procedure: COLONOSCOPY WITH PROPOFOL;  Surgeon: Beverley Fiedler, MD;  Location: [facility];  Service: Gastroenterology;  Laterality: N/A;   CYSTOSCOPY WITH STENT PLACEMENT Bilateral 07/12/2018   Procedure: CYSTOSCOPY WITH STENT PLACEMENT;  Surgeon: Crista Elliot, MD;  Location: [facility];  Service: Urology;  Laterality: Bilateral;   DIVERTING ILEOSTOMY N/A 07/12/2018   Procedure: DIVERTING LOOP ILEOSTOMY;  Surgeon: Andria Meuse, MD;  Location: [facility];  Service: General;  Laterality: N/A;   EYE SURGERY     FLEXIBLE SIGMOIDOSCOPY N/A 07/12/2018   Procedure: FLEXIBLE SIGMOIDOSCOPY;  Surgeon: Andria Meuse, MD;  Location: [facility];  Service: General;  Laterality: N/A;   FLEXIBLE SIGMOIDOSCOPY N/A 12/04/2018   Procedure: FLEXIBLE SIGMOIDOSCOPY;  Surgeon: Andria Meuse, MD;  Location: [facility];  Service: General;  Laterality: N/A;   ILEO LOOP COLOSTOMY CLOSURE N/A 04/20/2019   Procedure: OPEN TAKEDOWN OF ILEOSTOMY, primary repair of parastomal hernia;  Surgeon: Andria Meuse, MD;  Location: [facility];  Service: General;  Laterality: N/A;   LAPAROSCOPIC LOW ANTERIOR RESECTION N/A 07/12/2018   Procedure: LAPAROSCOPIC LOW ANTERIOR RESECTION WITH COLOPROSTOSTOMY, ERAS PATHWAY;  Surgeon: Cliffton Asters,  Stephanie Coup, MD;  Location: [facility];  Service: General;  Laterality: N/A;   POLYPECTOMY  07/04/2018   Procedure: POLYPECTOMY;  Surgeon: Beverley Fiedler, MD;  Location: [facility];  Service: Gastroenterology;;   SHOULDER SURGERY Left    Rotary Cuff Tear   TUBAL LIGATION      OB History     Gravida  4   Para  3   Term  3   Preterm      AB  1   Living  3      SAB      IAB  1   Ectopic      Multiple      Live Births               Home Medications    Prior to Admission medications   Medication Sig Start Date  End Date Taking? Authorizing Provider  acetaminophen (TYLENOL) 325 MG tablet Take 2 tablets (650 mg total) by mouth every 6 (six) hours as needed for mild pain, fever or headache (or Fever >/= 101). 06/21/19   Shon Hale, MD  albuterol (PROVENTIL) (2.5 MG/3ML) 0.083% nebulizer solution Take 3 mLs (2.5 mg total) by nebulization every 4 (four) hours as needed for wheezing or shortness of breath. Patient not taking: Reported on 06/10/2023 09/20/22 09/20/23  Champ Mungo, DO  alendronate (FOSAMAX) 70 MG tablet TAKE 1 TABLET BY MOUTH ONCE WEEKLY. TAKE WITH A FULL GLASS OF WATER ON EMPTY STOMACH 05/18/23   Champ Mungo, DO  diclofenac Sodium (VOLTAREN) 1 % GEL Apply 4 g topically 4 (four) times daily. 08/30/23   Monna Fam, MD  ibuprofen (ADVIL) 800 MG tablet Take 800 mg by mouth every 6 (six) hours as needed. 09/14/23   [provider]  revefenacin (YUPELRI) 175 MCG/3ML nebulizer solution Take 3 mLs (175 mcg total) by nebulization daily. 09/20/22   Champ Mungo, DO  Vitamin D, Ergocalciferol, (DRISDOL) 1.25 MG (50000 UNIT) CAPS capsule Take 1 capsule (50,000 Units total) by mouth every 7 (seven) days. 09/01/23   Monna Fam, MD    Family History Family History  Problem Relation Age of Onset   Cancer Mother        Uterus   Dementia Mother    Uterine cancer Mother    Endometrial cancer Mother    Alzheimer's disease Father    Stomach cancer Sister    Ovarian cancer Sister    Cancer - Ovarian Sister    Diabetes Sister    Asthma Sister    Prostate cancer Brother    Cancer - Prostate Brother    Cancer Brother        lung/brain   Breast cancer Daughter    Cancer Other    Colon cancer Neg Hx    Esophageal cancer Neg Hx    Pancreatic cancer Neg Hx    Rectal cancer Neg Hx    Colon polyps Neg Hx     Social History Social History   Tobacco Use   Smoking status: Every Day    Current packs/day: 1.50    Types: Cigarettes   Smokeless tobacco: Never   Tobacco comments:    1.5 PPD   Vaping Use   Vaping status: Never Used  Substance Use Topics   Alcohol use: No    Alcohol/week: 0.0 standard drinks of alcohol   Drug use: No     Allergies   Aleve [naproxen sodium]   Review of Systems Review of Systems   Physical Exam  Triage Vital Signs ED Triage Vitals  Encounter Vitals Group     BP 10/19/23 1140 (!) 157/83     Systolic BP Percentile --      Diastolic BP Percentile --      Pulse Rate 10/19/23 1140 85     Resp 10/19/23 1140 16     Temp 10/19/23 1140 98.4 F (36.9 C)     Temp Source 10/19/23 1140 Oral     SpO2 10/19/23 1140 98 %     Weight 10/19/23 1139 120 lb (54.4 kg)     Height 10/19/23 1139 5\' 1"  (1.549 m)     Head Circumference --      Peak Flow --      Pain Score 10/19/23 1139 0     Pain Loc --      Pain Education --      Exclude from Growth Chart --    No data found.  Updated Vital Signs BP (!) 157/83 (BP Location: Left Arm)   Pulse 85   Temp 98.4 F (36.9 C) (Oral)   Resp 16   Ht 5\' 1"  (1.549 m)   Wt 120 lb (54.4 kg)   SpO2 98%   BMI 22.67 kg/m   Visual Acuity Right Eye Distance:   Left Eye Distance:   Bilateral Distance:    Right Eye Near:   Left Eye Near:    Bilateral Near:     Physical Exam Vitals and nursing note reviewed.  Musculoskeletal:     Comments: Left ankle no visible swelling or bruising, good range of motion, has tenderness over the lateral malleolus, no deformity, foot good DP pulses no swelling, bruising or tenderness moves all digits, capillary refill brisk  Skin:    General: Skin is warm and dry.  Psychiatric:        Mood and Affect: Mood normal.        Behavior: Behavior normal.      UC Treatments / Results  Labs (all labs ordered are listed, but only abnormal results are displayed) Labs Reviewed - No data to display  EKG   Radiology No results found.  Procedures Procedures (including critical care time)  Medications Ordered in UC Medications - No data to display  Initial  Impression / Assessment and Plan / UC Course  I have reviewed the triage vital signs and the nursing notes.  Pertinent labs & imaging results that were available during my care of the patient were reviewed by me and considered in my medical decision making (see chart for details).     72 year old with continued left ankle pain after fracture in November 2024.  Is no swelling on exam but has mild tenderness over the lateral malleolus.  Left ankle x-ray independently viewed by me fracture distal fibula with arthritic changes Recommend she follow-up with Ortho OTC Tylenol as directed on the package for pain Final Clinical Impressions(s) / UC Diagnoses   Final diagnoses:  Chronic pain of left ankle   Discharge Instructions   None    ED Prescriptions   None    PDMP not reviewed this encounter.   Meliton Rattan, [place] 10/19/23 1243

## 2023-10-19 NOTE — Discharge Instructions (Signed)
Use your walking boot as needed Follow-up with an orthopedist Over the counter Tylenol as directed on the package for pain The x-ray reading we discussed is preliminary. Your x-ray will be read by a radiologist in next few hours. If there is a discrepancy, you will be contacted, and instructed on a new plan for you care.

## 2023-11-28 DIAGNOSIS — H31011 Macula scars of posterior pole (postinflammatory) (post-traumatic), right eye: Secondary | ICD-10-CM | POA: Diagnosis not present

## 2023-11-28 DIAGNOSIS — H2513 Age-related nuclear cataract, bilateral: Secondary | ICD-10-CM | POA: Diagnosis not present

## 2024-01-05 ENCOUNTER — Telehealth: Payer: Self-pay

## 2024-01-05 NOTE — Telephone Encounter (Signed)
 Copied from CRM [id number]. Topic: General - Call Back - No Documentation >> Jan 05, 2024 10:38 AM Antony Haste wrote: Reason for CRM: The patient received a missed call from the office yesterday on 03/05 in the afternoon, she is unsure what this call was pertaining to. Advised to the patient there is currently no documentation shown regarding her missed call from yesterday, the patient understood. She states if anything is needed to give her a direct callback at anytime.

## 2024-01-05 NOTE — Telephone Encounter (Signed)
 Called and spoke with patient. Pt wanted an appt to come in for routine checkup. Scheduled her an appt for 01/23/2024 @ 10:45am with Dr. Ninfa Meeker.

## 2024-01-23 ENCOUNTER — Encounter: Payer: Self-pay | Admitting: Student

## 2024-01-23 ENCOUNTER — Ambulatory Visit: Admitting: Student

## 2024-01-23 VITALS — BP 120/68 | HR 67 | Temp 97.9°F | Ht 61.0 in | Wt 125.4 lb

## 2024-01-23 DIAGNOSIS — F329 Major depressive disorder, single episode, unspecified: Secondary | ICD-10-CM

## 2024-01-23 DIAGNOSIS — C20 Malignant neoplasm of rectum: Secondary | ICD-10-CM

## 2024-01-23 DIAGNOSIS — Z59819 Housing instability, housed unspecified: Secondary | ICD-10-CM

## 2024-01-23 DIAGNOSIS — F17210 Nicotine dependence, cigarettes, uncomplicated: Secondary | ICD-10-CM | POA: Diagnosis not present

## 2024-01-23 DIAGNOSIS — M810 Age-related osteoporosis without current pathological fracture: Secondary | ICD-10-CM

## 2024-01-23 DIAGNOSIS — Z720 Tobacco use: Secondary | ICD-10-CM

## 2024-01-23 MED ORDER — VITAMIN D (ERGOCALCIFEROL) 1.25 MG (50000 UNIT) PO CAPS: 50000.0000 [IU] | ORAL_CAPSULE | ORAL | 0 refills | Status: AC

## 2024-01-23 MED ORDER — ALENDRONATE SODIUM 70 MG PO TABS: 70.0000 mg | ORAL_TABLET | ORAL | 0 refills | Status: DC

## 2024-01-23 NOTE — Assessment & Plan Note (Addendum)
 She had rectal cancer in 2019 with subsequent radiation, chemotherapy, surgery.  At 1 point she had an ostomy that has since been repaired.  Her most recent colonoscopy was 2021 and at this point she is due for another colonoscopy.  I will refer her for one.  She does not report any recent changes in bowel habits nor blood in the stool. - Referral for colonoscopy

## 2024-01-23 NOTE — Assessment & Plan Note (Signed)
 She continues to smoke.  She has a diagnosis of emphysema although does not prefer to take any inhalers from it.  Smokes about 1 pack a day.  It is for stress relief.  She is not interested in quitting at this time.  Can revisit in the future.

## 2024-01-23 NOTE — Assessment & Plan Note (Addendum)
 In her past she has been prescribed Prozac and Zoloft at different times although she has not taken them for some time and is unclear if she ever took the medicine.  Today her PHQ-9 is a score of 7 which is an improvement from 12 in 2023.  She does not report consistent depressive or anxiety symptoms. - Continue without medical management.  Monitor.

## 2024-01-23 NOTE — Progress Notes (Signed)
 CC: Checkup  HPI:  Deborah Arroyo is a 73 y.o. female with a PMH stated below who presents today for checkup after being called to schedule an appointment. She has no specific complaints and her only request is for a refill of her alendronate.  Please see problem based assessment and plan for additional details.  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  Vitals:   01/23/24 1037  BP: 120/68  Pulse: 67  Temp: 97.9 F (36.6 C)  TempSrc: Oral  SpO2: 97%  Weight: 125 lb 6.4 oz (56.9 kg)  Height: 5\' 1"  (1.549 m)    Physical Exam: Constitutional: well-appearing woman in no acute distress HENT: normocephalic atraumatic, mucous membranes moist Eyes: conjunctiva non-erythematous Cardiovascular: regular rate and rhythm, no m/r/g Pulmonary/Chest: normal work of breathing on room air, lungs clear to auscultation bilaterally Abdominal: soft, non-tender, non-distended MSK: normal bulk and tone Neurological: alert & oriented x 3, no focal deficit Skin: warm and dry Psych: normal mood and behavior  Assessment & Plan:   Patient discussed with Dr. Deirdre Priest  Rectal cancer s/p LAR/diverting loop ileostomy 07/12/2018 She had rectal cancer in 2019 with subsequent radiation, chemotherapy, surgery.  At  1 point she had an ostomy that has since been repaired.  Her most recent colonoscopy was 2021 and at this point she is due for another colonoscopy.  I will refer her for one.  She does not report any recent changes in bowel habits nor blood in the stool. - Referral for colonoscopy  Major depressive disorder In her past she has been prescribed Prozac and Zoloft at different times although she has not taken them for some time and is unclear if she ever took the medicine.  Today her PHQ-9 is a score of 7 which is an improvement from 12 in 2023.  She does not report consistent depressive or anxiety symptoms. - Continue without medical management.  Monitor.  Osteoporosis She continues to take alendronate.  I will provide a refill.  At last visit it was discovered that her vitamin D was low and she was decided to start on supplementation, although she did not pick up the medicine.  I will reorder this medicine.  She would prefer to avoid lab draw today but consider rechecking a vitamin D level at her next lab draw and reduce daily dose if her levels have improved.  Housing situation unstable Her primary stressor is her housing situation, which is unstable.  She lives in a home with her adult children and grandchildren.  She states that there are places where the ceiling is falling down and where the water system is unreliable and  they have to turn water on her off in their house.  I discussed the option of referring her to social work but she declines this at this time, afraid that it might be be condemned. - If she changes her mind, can involve social work in the future  Tobacco abuse She continues to smoke.  She has a diagnosis of emphysema although does not prefer to take any inhalers from it.  Smokes about 1 pack a day.  It is for stress relief.  She is not interested in quitting at this time.  Can revisit in the future.  RTC in 6 months for checkup. Can consider labs at that time, lipids, A1c, Vit D,  as indicated.  Katheran James, D.O. [facility] Internal Medicine, PGY-1 Phone: [phone] Date 01/23/2024 Time 11:29 AM

## 2024-01-23 NOTE — Assessment & Plan Note (Addendum)
 She continues to take alendronate.  I will provide a refill.  At last visit it was discovered that her vitamin D was low and she was decided to start on supplementation, although she did not pick up the medicine.  I will reorder this medicine.  She would prefer to avoid lab draw today but consider rechecking a vitamin D level at her next lab draw and reduce daily dose if her levels have improved.

## 2024-01-23 NOTE — Assessment & Plan Note (Signed)
 Her primary stressor is her housing situation, which is unstable.  She lives in a home with her adult children and grandchildren.  She states that there are places where the ceiling is falling down and where the water system is unreliable and they have to turn water on her off in their house.  I discussed the option of referring her to social work but she declines this at this time, afraid that it might be be condemned. - If she changes her mind, can involve social work in the future

## 2024-01-24 NOTE — Progress Notes (Signed)
 Internal Medicine Clinic Attending  Case discussed with the resident at the time of the visit.  We reviewed the resident's history and exam and pertinent patient test results.  I agree with the assessment, diagnosis, and plan of care documented in the resident's note.

## 2024-02-08 ENCOUNTER — Encounter: Payer: Self-pay | Admitting: Internal Medicine

## 2024-04-11 ENCOUNTER — Ambulatory Visit (INDEPENDENT_AMBULATORY_CARE_PROVIDER_SITE_OTHER)

## 2024-04-11 ENCOUNTER — Encounter: Payer: Self-pay | Admitting: Emergency Medicine

## 2024-04-11 ENCOUNTER — Ambulatory Visit
Admission: EM | Admit: 2024-04-11 | Discharge: 2024-04-11 | Disposition: A | Discharge status: Home / Self Care | Attending: Family Medicine | Admitting: Family Medicine

## 2024-04-11 ENCOUNTER — Other Ambulatory Visit: Payer: Self-pay

## 2024-04-11 DIAGNOSIS — M25561 Pain in right knee: Secondary | ICD-10-CM

## 2024-04-11 DIAGNOSIS — S83241A Other tear of medial meniscus, current injury, right knee, initial encounter: Secondary | ICD-10-CM | POA: Diagnosis not present

## 2024-04-11 MED ORDER — DEXAMETHASONE SODIUM PHOSPHATE 10 MG/ML IJ SOLN
10.0000 mg | Freq: Once | INTRAMUSCULAR | Status: AC
  Administered 2024-04-11: 10 mg via INTRAMUSCULAR

## 2024-04-11 NOTE — ED Triage Notes (Signed)
 Pt st's she fell over railroad ties 2 months ago.  Has had pain in right knee since.  Pt also st's knee has been swollen x's 1 month.   Pt  st's she pulled a tick from behind right knee a month ago

## 2024-04-11 NOTE — Discharge Instructions (Addendum)
 Meds ordered this encounter  Medications   dexamethasone (DECADRON) injection 10 mg

## 2024-04-14 NOTE — ED Provider Notes (Signed)
 [facility]   [id number] 04/11/24 Arrival Time: 1033  ASSESSMENT & PLAN:  1. Acute pain of right knee    I have personally viewed and independently interpreted the imaging studies ordered this visit. R knee: no acute bony changes.  Trial of: Meds ordered this encounter  Medications   dexamethasone  (DECADRON ) injection 10 mg    Orders Placed This Encounter  Procedures   DG Knee Complete 4 Views Right   Apply knee brace/sleeve   Recommend:  Follow-up Information     Schedule an appointment as soon as possible for a visit  with [facility], [facility].   Specialty: Specialist Contact information: [address] [place] [address] [phone]                Reviewed expectations re: course of current medical issues. Questions answered. Outlined signs and symptoms indicating need for more acute intervention. Patient verbalized understanding. After Visit Summary given.  SUBJECTIVE: History from: patient. Deborah Arroyo is a 73 y.o. female who reports falling over railroad ties 2 months ago. Vague pain in R knee since. Occas swelling. Denies extremity sensation changes or weakness. No tx PTA.  Past Surgical History:  Procedure Laterality Date   ABDOMINAL HYSTERECTOMY     APPENDECTOMY     BIOPSY  07/04/2018   Procedure: BIOPSY;  Surgeon: Nannette Babe, MD;  Location: [facility];  Service: Gastroenterology;;   CHOLECYSTECTOMY     COLONOSCOPY  03/01/2018   Dr.Pyrtle   COLONOSCOPY WITH PROPOFOL  N/A 07/04/2018   Procedure: COLONOSCOPY WITH PROPOFOL ;  Surgeon: Nannette Babe, MD;  Location: [facility];  Service: Gastroenterology;  Laterality: N/A;   CYSTOSCOPY WITH STENT PLACEMENT Bilateral 07/12/2018   Procedure: CYSTOSCOPY WITH STENT PLACEMENT;  Surgeon: Samson Croak, MD;  Location: [facility];  Service: Urology;  Laterality: Bilateral;   DIVERTING ILEOSTOMY N/A 07/12/2018   Procedure: DIVERTING LOOP ILEOSTOMY;  Surgeon: Melvenia Stabs, MD;   Location: [facility];  Service: General;  Laterality: N/A;   EYE SURGERY     FLEXIBLE SIGMOIDOSCOPY N/A 07/12/2018   Procedure: FLEXIBLE SIGMOIDOSCOPY;  Surgeon: Melvenia Stabs, MD;  Location: [facility];  Service: General;  Laterality: N/A;   FLEXIBLE SIGMOIDOSCOPY N/A 12/04/2018   Procedure: FLEXIBLE SIGMOIDOSCOPY;  Surgeon: Melvenia Stabs, MD;  Location: [facility];  Service: General;  Laterality: N/A;   ILEO LOOP COLOSTOMY CLOSURE N/A 04/20/2019   Procedure: OPEN TAKEDOWN OF ILEOSTOMY, primary repair of parastomal hernia;  Surgeon: Melvenia Stabs, MD;  Location: [facility];  Service: General;  Laterality: N/A;   LAPAROSCOPIC LOW ANTERIOR RESECTION N/A 07/12/2018   Procedure: LAPAROSCOPIC LOW ANTERIOR RESECTION WITH COLOPROSTOSTOMY, ERAS PATHWAY;  Surgeon: Melvenia Stabs, MD;  Location: [facility];  Service: General;  Laterality: N/A;   POLYPECTOMY  07/04/2018   Procedure: POLYPECTOMY;  Surgeon: Nannette Babe, MD;  Location: [facility];  Service: Gastroenterology;;   SHOULDER SURGERY Left    Rotary Cuff Tear   TUBAL LIGATION        OBJECTIVE:  Vitals:   04/11/24 1140  BP: (!) 146/81  Pulse: 73  Resp: 20  Temp: 97.8 F (36.6 C)  TempSrc: Oral  SpO2: 93%    General appearance: alert; no distress HEENT: [place]; AT Neck: supple with FROM Resp: unlabored respirations Extremities: RLE: warm with well perfused appearance; mild anterior tenderness over right knee; without gross deformities; swelling: none; bruising: none; knee ROM: normal, with discomfort CV: brisk extremity capillary refill of RLE; 2+  DP pulse of RLE. Skin: warm and dry; no visible rashes Neurologic: gait normal; normal sensation and strength of RLE Psychological: alert and cooperative; normal mood and affect  Imaging: DG Knee Complete 4 Views Right Result Date: 04/11/2024 CLINICAL DATA:  Pain after fall EXAM: RIGHT KNEE - COMPLETE 5 VIEW COMPARISON:  X-ray 09/07/2023. FINDINGS: No evidence of fracture,  dislocation, or joint effusion. No evidence of arthropathy or other focal bone abnormality. Soft tissues are unremarkable. IMPRESSION: No acute osseous abnormality. Electronically Signed   By: Adrianna Horde M.D.   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   Hypoglycemia    Hyponatremia    syncope with this and admitted twice due to this issue    Osteopenia    Osteoporosis    Rectal bleeding    Rectal cancer s/p LAR/diverting loop ileostomy 07/12/2018 03/06/2018   Smoker    Social History   Socioeconomic History   Marital status: Divorced    Spouse name: Not on file   Number of children: 3   Years of education: Not on file   Highest education level: 8th grade  Occupational History   Occupation: Retired    Associate Professor: [employer]  Tobacco Use   Smoking status: Every Day    Current packs/day: 1.50    Types: Cigarettes   Smokeless tobacco: Never   Tobacco comments:    1.5 PPD  Vaping Use   Vaping status: Never Used  Substance and Sexual Activity   Alcohol use: No    Alcohol/week: 0.0 standard drinks of alcohol   Drug use: No   Sexual activity: Not Currently  Other Topics Concern   Not on file  Social History Narrative   Divorced, lives with 2 sons and 1 grandchild, I dog. She drinks 3-4 cups of coffee a day. Is very active around the house. Has 3 brothers and 6 sisters.      Current Social History 07/14/21       Patient lives with 2 sons and 1  grandson in a two level home. There are 2 steps without handrails up to the entrance the patient uses.       Patient's method of transportation is via friend's car at present.      The highest level of education was 8th grade      The patient currently retired from [employer].      Identified important Relationships are: my kids, my grandsons, and my sister.      Pets : A miniature shepherd named CJ       Interests / Fun: bingo once in a blue moon      Current Stressors: living conditions      Religious / Personal Beliefs: None    Social Drivers of Health   Financial Resource Strain: High Risk (10/04/2023)   Overall  Financial Resource Strain (CARDIA)    Difficulty of Paying Living Expenses: Hard  Food Insecurity: No Food Insecurity (10/04/2023)   Hunger Vital Sign    Worried About Running Out of Food in the Last Year: Never true    Ran Out of Food in the Last Year: Never true  Transportation Needs: No Transportation Needs (10/04/2023)   PRAPARE - Administrator, Civil Service (Medical): No    Lack of Transportation (Non-Medical): No  Physical Activity: Insufficiently Active (10/04/2023)   Exercise Vital Sign    Days of Exercise per Week: 3 days    Minutes of Exercise per Session: 20 min  Stress: Stress Concern Present (10/04/2023)   [employer] of Occupational Health - Occupational Stress Questionnaire    Feeling of Stress : Very much  Social Connections: Socially Isolated (10/04/2023)   Social Connection and Isolation Panel    Frequency of Communication with Friends and Family: More than three times a week    Frequency of Social Gatherings with Friends and Family: More than three times a week    Attends Religious Services: Never    Database administrator or Organizations: No    Attends Engineer, structural: Never    Marital Status: Divorced   Family History  Problem Relation Age of Onset   Cancer Mother        Uterus   Dementia Mother     Uterine cancer Mother    Endometrial cancer Mother    Alzheimer's disease Father    Stomach cancer Sister    Ovarian cancer Sister    Cancer - Ovarian Sister    Diabetes Sister    Asthma Sister    Prostate cancer Brother    Cancer - Prostate Brother    Cancer Brother        lung/brain   Breast cancer Daughter    Cancer Other    Colon cancer Neg Hx    Esophageal cancer Neg Hx    Pancreatic cancer Neg Hx    Rectal cancer Neg Hx    Colon polyps Neg Hx    Past Surgical History:  Procedure Laterality Date   ABDOMINAL HYSTERECTOMY     APPENDECTOMY     BIOPSY  07/04/2018   Procedure: BIOPSY;  Surgeon: Nannette Babe, MD;  Location: [facility];  Service: Gastroenterology;;   CHOLECYSTECTOMY     COLONOSCOPY  03/01/2018   Dr.Pyrtle   COLONOSCOPY WITH PROPOFOL  N/A 07/04/2018   Procedure: COLONOSCOPY WITH PROPOFOL ;  Surgeon: Nannette Babe, MD;  Location: [facility];  Service: Gastroenterology;  Laterality: N/A;   CYSTOSCOPY WITH STENT PLACEMENT Bilateral 07/12/2018   Procedure: CYSTOSCOPY WITH STENT PLACEMENT;  Surgeon: Samson Croak, MD;  Location: [facility];  Service: Urology;  Laterality: Bilateral;   DIVERTING ILEOSTOMY N/A 07/12/2018   Procedure: DIVERTING LOOP ILEOSTOMY;  Surgeon: Melvenia Stabs, MD;  Location: [facility];  Service: General;  Laterality: N/A;   EYE SURGERY     FLEXIBLE SIGMOIDOSCOPY N/A 07/12/2018   Procedure: FLEXIBLE SIGMOIDOSCOPY;  Surgeon: Melvenia Stabs, MD;  Location: [facility];  Service: General;  Laterality: N/A;   FLEXIBLE SIGMOIDOSCOPY N/A 12/04/2018   Procedure: FLEXIBLE SIGMOIDOSCOPY;  Surgeon: Melvenia Stabs, MD;  Location: [facility];  Service: General;  Laterality: N/A;   ILEO LOOP COLOSTOMY CLOSURE N/A 04/20/2019   Procedure: OPEN TAKEDOWN OF ILEOSTOMY, primary repair of parastomal hernia;  Surgeon: Melvenia Stabs, MD;  Location: [facility];  Service: General;  Laterality: N/A;   LAPAROSCOPIC LOW ANTERIOR RESECTION N/A 07/12/2018    Procedure: LAPAROSCOPIC LOW ANTERIOR RESECTION WITH COLOPROSTOSTOMY, ERAS PATHWAY;  Surgeon: Melvenia Stabs, MD;  Location: [facility];  Service: General;  Laterality: N/A;   POLYPECTOMY  07/04/2018   Procedure: POLYPECTOMY;  Surgeon: Nannette Babe, MD;  Location: [facility];  Service: Gastroenterology;;   SHOULDER SURGERY Left    Rotary Cuff Tear   TUBAL Jules Oar, MD 04/14/24 1131

## 2024-07-24 DIAGNOSIS — Z1231 Encounter for screening mammogram for malignant neoplasm of breast: Secondary | ICD-10-CM | POA: Diagnosis not present

## 2024-11-08 ENCOUNTER — Telehealth: Payer: Self-pay | Admitting: *Deleted

## 2024-11-08 NOTE — Telephone Encounter (Signed)
 Mammogram was performed by Lorin [phone]) September 3.2025 / Zayda is to fax report to our office.

## 2024-12-07 ENCOUNTER — Telehealth: Payer: Self-pay

## 2024-12-07 NOTE — Telephone Encounter (Signed)
 COPD : RN Telephone Call   Patient ID: Deborah Arroyo, female    DOB: [birth year]  Age: 74 y.o. MRN: [id number]  Deborah Arroyo is a 74 y.o. who was called about COPD management.   Patient reports that they are taking the following medications for COPD:  NO ( all medications   has ran out )  Patient reports they DO or DO NOT have a prior pulmonologist who they follow:  under  the care of our clinic   Please document smoking status, how many packs? Ready to quit ?  - Smoking Hx: She currently smokes 32 packs per month.   mMRC (Modified Medical Research Council) Dyspnea Scale   Ask the following questions and please check the box that describes the most.   Choose ONE>   []  Grade 0: I only get breathless with strenuous exercise   []  Grade 1: I get short of breath when hurry on level ground or walking up a slight hill   []  Grade 2: On level Ground, I walk slower than people of the same age because of breathlessness or have to stop for breath when walking my own pace   []  Grade 3: I Stop for breath after walking about 100 yards or after a few minutes on level ground   []  Grade 4: I am too breathless to leave the house, or I am breathless when dressing  ( Pt reports that  she  does gets SOB but  not to the point where she  has to stop . So she pushes through)   CAT (COPD Assessment Test)   Please ask each of the following questions and select between 0-5 based on what describes the patient most accurately.  On a scale 0 to 5, do you never cough or cough all the time: 1  3 1  0 (When I walk up a hill or one flight of stairs I am not breathless)   0 (I am not limited doing any activities at home) 0 (I am confident leaving my home despite my lung condition) 1 On a scale 0 to 5, how would you describe your energy?: 5 (I have no energy at all)  Total: 11

## 2025-01-09 ENCOUNTER — Ambulatory Visit: Payer: Self-pay | Admitting: Student
